# Patient Record
Sex: Female | Born: 1955
Health system: Southern US, Community
[De-identification: ages and names within clinical notes are randomized; demographics above are authoritative.]

## PROBLEM LIST (undated history)

## (undated) DIAGNOSIS — M8580 Other specified disorders of bone density and structure, unspecified site: Secondary | ICD-10-CM

## (undated) DIAGNOSIS — K219 Gastro-esophageal reflux disease without esophagitis: Secondary | ICD-10-CM

## (undated) DIAGNOSIS — Q969 Turner's syndrome, unspecified: Secondary | ICD-10-CM

## (undated) DIAGNOSIS — E785 Hyperlipidemia, unspecified: Secondary | ICD-10-CM

## (undated) DIAGNOSIS — K649 Unspecified hemorrhoids: Secondary | ICD-10-CM

## (undated) DIAGNOSIS — Z86010 Personal history of colonic polyps: Secondary | ICD-10-CM

## (undated) DIAGNOSIS — H9190 Unspecified hearing loss, unspecified ear: Secondary | ICD-10-CM

## (undated) DIAGNOSIS — I10 Essential (primary) hypertension: Secondary | ICD-10-CM

## (undated) DIAGNOSIS — E538 Deficiency of other specified B group vitamins: Secondary | ICD-10-CM

## (undated) DIAGNOSIS — K921 Melena: Secondary | ICD-10-CM

## (undated) DIAGNOSIS — S72142A Displaced intertrochanteric fracture of left femur, initial encounter for closed fracture: Secondary | ICD-10-CM

## (undated) DIAGNOSIS — K5289 Other specified noninfective gastroenteritis and colitis: Secondary | ICD-10-CM

## (undated) DIAGNOSIS — K5730 Diverticulosis of large intestine without perforation or abscess without bleeding: Secondary | ICD-10-CM

## (undated) HISTORY — PX: COLONOSCOPY: SHX174

## (undated) HISTORY — DX: Unspecified hemorrhoids: K64.9

## (undated) HISTORY — DX: Personal history of colonic polyps: Z86.010

## (undated) HISTORY — PX: TONSILLECTOMY: SUR1361

## (undated) HISTORY — DX: Melena: K92.1

## (undated) HISTORY — DX: Hyperlipidemia, unspecified: E78.5

## (undated) HISTORY — DX: Deficiency of other specified B group vitamins: E53.8

## (undated) HISTORY — DX: Unspecified hearing loss, unspecified ear: H91.90

## (undated) HISTORY — PX: UPPER GASTROINTESTINAL ENDOSCOPY: SHX188

## (undated) HISTORY — DX: Diverticulosis of large intestine without perforation or abscess without bleeding: K57.30

## (undated) HISTORY — DX: Other specified noninfective gastroenteritis and colitis: K52.89

## (undated) HISTORY — PX: POLYPECTOMY: SHX149

---

## 2001-12-23 ENCOUNTER — Encounter: Payer: Self-pay | Admitting: Family Medicine

## 2001-12-23 ENCOUNTER — Encounter: Admission: RE | Admit: 2001-12-23 | Discharge: 2001-12-23 | Payer: Self-pay | Admitting: Family Medicine

## 2002-02-11 ENCOUNTER — Encounter: Payer: Self-pay | Admitting: Gastroenterology

## 2002-02-27 ENCOUNTER — Encounter (INDEPENDENT_AMBULATORY_CARE_PROVIDER_SITE_OTHER): Payer: Self-pay | Admitting: *Deleted

## 2002-02-27 ENCOUNTER — Encounter (INDEPENDENT_AMBULATORY_CARE_PROVIDER_SITE_OTHER): Payer: Self-pay | Admitting: Specialist

## 2002-02-27 ENCOUNTER — Encounter: Payer: Self-pay | Admitting: Gastroenterology

## 2002-02-27 ENCOUNTER — Ambulatory Visit (HOSPITAL_COMMUNITY): Admission: RE | Admit: 2002-02-27 | Discharge: 2002-02-27 | Payer: Self-pay | Admitting: Gastroenterology

## 2002-05-21 HISTORY — PX: INCISION AND DRAINAGE: SHX5863

## 2002-05-21 HISTORY — PX: FRACTURE SURGERY: SHX138

## 2003-01-06 ENCOUNTER — Ambulatory Visit (HOSPITAL_BASED_OUTPATIENT_CLINIC_OR_DEPARTMENT_OTHER): Admission: RE | Admit: 2003-01-06 | Discharge: 2003-01-06 | Payer: Self-pay | Admitting: *Deleted

## 2003-06-09 ENCOUNTER — Other Ambulatory Visit: Admission: RE | Admit: 2003-06-09 | Discharge: 2003-06-09 | Payer: Self-pay | Admitting: *Deleted

## 2003-07-20 ENCOUNTER — Encounter: Admission: RE | Admit: 2003-07-20 | Discharge: 2003-07-20 | Payer: Self-pay

## 2005-07-30 ENCOUNTER — Encounter: Admission: RE | Admit: 2005-07-30 | Discharge: 2005-07-30 | Payer: Self-pay | Admitting: *Deleted

## 2006-01-14 ENCOUNTER — Ambulatory Visit: Payer: Self-pay

## 2006-09-25 ENCOUNTER — Encounter: Admission: RE | Admit: 2006-09-25 | Discharge: 2006-09-25 | Payer: Self-pay | Admitting: Occupational Medicine

## 2006-11-27 ENCOUNTER — Encounter: Admission: RE | Admit: 2006-11-27 | Discharge: 2006-11-27 | Payer: Self-pay | Admitting: *Deleted

## 2007-05-22 HISTORY — PX: HERNIA REPAIR: SHX51

## 2008-03-01 ENCOUNTER — Encounter: Admission: RE | Admit: 2008-03-01 | Discharge: 2008-03-01 | Payer: Self-pay | Admitting: Obstetrics

## 2008-04-07 ENCOUNTER — Ambulatory Visit (HOSPITAL_BASED_OUTPATIENT_CLINIC_OR_DEPARTMENT_OTHER): Admission: RE | Admit: 2008-04-07 | Discharge: 2008-04-07 | Payer: Self-pay | Admitting: General Surgery

## 2008-12-29 ENCOUNTER — Ambulatory Visit: Payer: Self-pay | Admitting: Gastroenterology

## 2009-01-10 ENCOUNTER — Encounter: Payer: Self-pay | Admitting: Gastroenterology

## 2009-01-10 ENCOUNTER — Ambulatory Visit: Payer: Self-pay | Admitting: Gastroenterology

## 2009-01-11 ENCOUNTER — Encounter: Payer: Self-pay | Admitting: Gastroenterology

## 2009-01-12 ENCOUNTER — Telehealth: Payer: Self-pay | Admitting: Gastroenterology

## 2009-02-03 DIAGNOSIS — K5730 Diverticulosis of large intestine without perforation or abscess without bleeding: Secondary | ICD-10-CM

## 2009-02-03 DIAGNOSIS — I10 Essential (primary) hypertension: Secondary | ICD-10-CM | POA: Insufficient documentation

## 2009-02-03 DIAGNOSIS — Z860100 Personal history of colon polyps, unspecified: Secondary | ICD-10-CM

## 2009-02-03 DIAGNOSIS — K219 Gastro-esophageal reflux disease without esophagitis: Secondary | ICD-10-CM | POA: Insufficient documentation

## 2009-02-03 DIAGNOSIS — K649 Unspecified hemorrhoids: Secondary | ICD-10-CM

## 2009-02-03 DIAGNOSIS — K5289 Other specified noninfective gastroenteritis and colitis: Secondary | ICD-10-CM | POA: Insufficient documentation

## 2009-02-03 HISTORY — DX: Unspecified hemorrhoids: K64.9

## 2009-02-03 HISTORY — DX: Diverticulosis of large intestine without perforation or abscess without bleeding: K57.30

## 2009-02-03 HISTORY — DX: Other specified noninfective gastroenteritis and colitis: K52.89

## 2009-02-03 HISTORY — DX: Personal history of colon polyps, unspecified: Z86.0100

## 2009-02-03 HISTORY — DX: Personal history of colonic polyps: Z86.010

## 2009-02-08 ENCOUNTER — Ambulatory Visit: Payer: Self-pay | Admitting: Gastroenterology

## 2009-02-09 DIAGNOSIS — E538 Deficiency of other specified B group vitamins: Secondary | ICD-10-CM

## 2009-02-09 HISTORY — DX: Deficiency of other specified B group vitamins: E53.8

## 2009-02-09 LAB — CONVERTED CEMR LAB
Ferritin: 42.9 ng/mL (ref 10.0–291.0)
Folate: 13.6 ng/mL
Iron: 80 ug/dL (ref 42–145)

## 2009-02-14 ENCOUNTER — Ambulatory Visit: Payer: Self-pay | Admitting: Gastroenterology

## 2009-02-21 ENCOUNTER — Ambulatory Visit: Payer: Self-pay | Admitting: Gastroenterology

## 2009-02-28 ENCOUNTER — Ambulatory Visit: Payer: Self-pay | Admitting: Gastroenterology

## 2009-03-01 ENCOUNTER — Telehealth: Payer: Self-pay | Admitting: Gastroenterology

## 2009-03-09 ENCOUNTER — Encounter: Admission: RE | Admit: 2009-03-09 | Discharge: 2009-03-09 | Payer: Self-pay | Admitting: Obstetrics

## 2009-04-04 ENCOUNTER — Ambulatory Visit: Payer: Self-pay | Admitting: Gastroenterology

## 2009-04-12 ENCOUNTER — Ambulatory Visit: Payer: Self-pay | Admitting: Gastroenterology

## 2009-05-04 ENCOUNTER — Ambulatory Visit: Payer: Self-pay | Admitting: Gastroenterology

## 2009-06-08 ENCOUNTER — Ambulatory Visit: Payer: Self-pay | Admitting: Gastroenterology

## 2009-07-13 ENCOUNTER — Ambulatory Visit: Payer: Self-pay | Admitting: Gastroenterology

## 2009-08-10 ENCOUNTER — Ambulatory Visit: Payer: Self-pay | Admitting: Gastroenterology

## 2009-08-17 ENCOUNTER — Encounter (INDEPENDENT_AMBULATORY_CARE_PROVIDER_SITE_OTHER): Payer: Self-pay | Admitting: *Deleted

## 2009-09-07 ENCOUNTER — Ambulatory Visit: Payer: Self-pay | Admitting: Gastroenterology

## 2010-04-10 ENCOUNTER — Encounter: Admission: RE | Admit: 2010-04-10 | Discharge: 2010-04-10 | Payer: Self-pay | Admitting: Obstetrics

## 2010-06-20 NOTE — Assessment & Plan Note (Signed)
Summary: MONTHLY B 12 SHOT...LSW.  Nurse Visit   Allergies: No Known Drug Allergies  Medication Administration  Injection # 1:    Medication: Vit B12 1000 mcg    Diagnosis: B12 DEFICIENCY (ICD-266.2)    Route: IM    Site: R deltoid    Exp Date: 10/12    Lot #: [id number]    Mfr: American Regent    Patient tolerated injection without complications    Given by: Hortense Ramal CMA Duncan Dull) (June 08, 2009 3:23 PM)  Orders Added: 1)  Vit B12 1000 mcg [J3420]

## 2010-06-20 NOTE — Letter (Signed)
Summary: Office Visit Letter  [facility]  [address] [place], [place] [address]   Phone: [phone]  Fax: [phone]      August 17, 2009 MRN: [id number]   [name] [address] [place], [place]  [address]   Dear Ms. Roseman,   According to our records, it is time for you to schedule a follow-up office visit with [place].   At your convenience, please call [phone] (option #2)to schedule an office visit. If you have any questions, concerns, or feel that this letter is in error, we would appreciate your call.   Sincerely,  Vania Rea. Jarold Motto, M.D.  [facility] [phone]

## 2010-06-20 NOTE — Assessment & Plan Note (Signed)
Summary: B12 SHOT..AM.  Nurse Visit   Allergies: No Known Drug Allergies  Medication Administration  Injection # 1:    Medication: Vit B12 1000 mcg    Diagnosis: B12 DEFICIENCY (ICD-266.2)    Route: IM    Site: R deltoid    Exp Date: 03/22/2011    Lot #: [id number]    Mfr: American Regent    Patient tolerated injection without complications    Given by: Christie Nottingham CMA Duncan Dull) (July 13, 2009 3:21 PM)

## 2010-06-20 NOTE — Assessment & Plan Note (Signed)
Summary: 266.2/monthly b-12 inj/all  Nurse Visit   Allergies: No Known Drug Allergies  Medication Administration  Injection # 1:    Medication: Vit B12 1000 mcg    Diagnosis: B12 DEFICIENCY (ICD-266.2)    Route: IM    Site: R deltoid    Exp Date: 04/30/2011    Lot #: [id number]    Mfr: American Regent    Patient tolerated injection without complications    Given by: Christie Nottingham CMA Duncan Dull) (August 10, 2009 3:27 PM)  Orders Added: 1)  Vit B12 1000 mcg [J3420]

## 2010-06-20 NOTE — Assessment & Plan Note (Signed)
Summary: 266.2/monthly b-12 inj  Nurse Visit   Allergies: No Known Drug Allergies  Medication Administration  Injection # 1:    Medication: Vit B12 1000 mcg    Diagnosis: B12 DEFICIENCY (ICD-266.2)    Route: IM    Site: R deltoid    Exp Date: 06/22/2011    Lot #: [id number]    Mfr: American Regent    Patient tolerated injection without complications    Given by: Christie Nottingham CMA Duncan Dull) (September 07, 2009 3:41 PM)  Orders Added: 1)  Vit B12 1000 mcg [J3420]

## 2010-10-03 NOTE — Op Note (Signed)
[name], [name]               ACCOUNT NO.:  [id number]   MEDICAL RECORD NO.:  [id number]          PATIENT TYPE:  AMB   LOCATION:  DSC                          FACILITY:  [facility]   PHYSICIAN:  Almond Lint, MD       DATE OF BIRTH:  Aug 09, 1955   DATE OF PROCEDURE:  04/07/2008  DATE OF DISCHARGE:                               OPERATIVE REPORT   PREOPERATIVE DIAGNOSIS:  Left inguinal hernia.   POSTOPERATIVE DIAGNOSIS:  Left inguinal hernia.   PROCEDURE PERFORMED:  Left inguinal herniorrhaphy with mesh.   SURGEON:  Almond Lint, MD   ANESTHESIA:  General and local.   FINDINGS:  Moderate-sized indirect left inguinal hernia with omentum and  sac.   SPECIMEN:  None.   ESTIMATED BLOOD LOSS:  Minimal.   COMPLICATIONS:  None known.   PROCEDURE:  Ms. Lineberry was identified in the holding area and taken to  the operating room where she was placed supine on the operating room  table.  General anesthesia was induced.  Her left groin was clipped,  prepped, and draped in sterile fashion.  A time-out was performed  according to the surgical safety checklist.  When all was correct we  continued.  Her pubis was identified as well as her anterosuperior iliac  spine.  Her incision was marked approximately at the middle third of the  that line.  This was placed on skin crease.  The skin was anesthetized  with a mixture of 1% lidocaine plain with 0.25% Marcaine with  epinephrine.  The subcutaneous tissues were divided with the Bovie  electrocautery.  A Weitlaner retractor was placed into the subcutaneous  fat, and the Scarpa fascia was divided with the Bovie.  A large bridging  vein was clamped and divided and tied with 3-0 Vicryl ties.  The  external oblique was identified and was cleaned off with Kitner.  A 15-  blade was used to incise the external oblique fascia in the direction of  the fibers.  The Metzenbaum scissors were used to elevate the external  oblique fascia and extend the  incision through the internal ring  inferomedially and superolaterally.  The hernia sac was identified and  was dissected free from the pubis with blunt dissection.  There was no  spermatic cord.  The muscular fibers were cleaned off of the sac.  The  sac was isolated and clamped in 2 places.  This was opened sharply with  Metzenbaum scissors.  There was omentum in the sac and the round  ligament was seen.  However, there was no sliding component or any bowel  identified in the hernia sac.  This was ligated high and transected.  A  2-0 silk suture ligature was used to close the sac and dunk it back into  the abdomen.  The 3 x 6 polypropylene mesh was cut to the appropriate  shape.  This was secured to the pubic tubercle right along the inferior  shelving edge with 2-0 Prolene.  Superior shelving edge was also run  underneath the external oblique fascia.  The mesh laid down flat and  there was no spermatic cord.  There were no tails created.  The lateral  portion of the mesh was tucked up underneath the external oblique  fascia.  The ilioinguinal nerve was traversing exactly where the sutures  would need to be placed and to avoid nerve entrapment the ilioinguinal  nerve was transected.  The external oblique was closed over the top of  the mesh with a  running 2-0 Vicryl.  The Scarpa fascia was run with a 3-0 Vicryl and an  interrupted were placed in a deep dermal fashion with 3-0 Vicryl.  The  skin was run using 4-0 Monocryl and then the skin cleaned, dried, and  dressed with Dermabond.  The patient was awakened from anesthesia and  taken to the PACU in stable condition.      Almond Lint, MD  Electronically Signed     FB/MEDQ  D:  04/07/2008  T:  04/07/2008  Job:  [id number]

## 2010-10-06 NOTE — Op Note (Signed)
[name], [name] NO.:  [id number]   MEDICAL RECORD NO.:  [id number]                   PATIENT TYPE:  AMB   LOCATION:  DSC                                  FACILITY:  [facility]   PHYSICIAN:  Lowell Bouton, M.D.      DATE OF BIRTH:  1955-08-03   DATE OF PROCEDURE:  01/06/2003  DATE OF DISCHARGE:                                 OPERATIVE REPORT   PREOPERATIVE DIAGNOSIS:  Open fracture proximal phalanx left thumb with  acute osteomyelitis.   POSTOPERATIVE DIAGNOSIS:  Open fracture proximal phalanx left thumb with  acute osteomyelitis.   PROCEDURE:  Irrigation and debridement of left thumb with open pinning of  proximal phalanx fracture.   SURGEON:  Lowell Bouton, M.D.   ANESTHESIA:  General.   FINDINGS:  The patient had a dog bite with puncture wounds both radially and  ulnarly that was six days old.  The proximal phalanx was fractured through  the proximal end with a longitudinal split distally.  The fracture was  displaced and there was an area of erosion in the metaphysis.  This appeared  to be consistent with osteomyelitis. There was no gross purulent material at  the time of incision and drainage.   DESCRIPTION OF PROCEDURE:  Under general anesthesia with a tourniquet on the  left arm, the left hand was prepped and draped in the usual sterile fashion.  After elevating the limb, the tourniquet was inflated to 275 mmHg. A  longitudinal incision was made over the proximal phalanx of the left thumb.  Sharp dissection was carried down through the subcutaneous tissues and  bleeding points were coagulated. Blunt dissection was carried down to the  EPL which was retracted ulnarly.  The extensor mechanism was incised  longitudinally just radial to the EPL tendon.  Sharp dissection was carried  down through the periosteum to the fracture site.  Cultures were obtained  both aerobic and anaerobic.  The fracture site was irrigated  copiously with  pulsatile lavage irrigation and a curet was used to debride the metaphyseal  area of the bone. Again, the pulsatile lavage was used to irrigate out the  fracture site.  A 4.5 K-wire was then placed retrograde across the  metaphysis of the proximal phalanx and out through the skin. A second K-wire  was placed percutaneously through the proximal fragment and then the  fracture was reduced.  Crossed 4.5 K-wires were used for fixation and there  was good alignment of the fracture on the x-ray.  The K-wires were bent over  and left protruding from the skin. The wound was again irrigated with  pulsatile lavage. Iodoform packing was inserted at the fracture site and the  wound edges were closed with 4-0 nylon suture leaving the central portion  open. Sterile dressings were applied followed by a thumb spica splint.  0.5%  Marcaine digital block was inserted for pain control.  The patient was  placed in a thumb spica splint and went to the recovery room awake, stable,  and in good condition.                                               Lowell Bouton, M.D.    EMM/MEDQ  D:  01/06/2003  T:  01/06/2003  Job:  [id number]

## 2010-10-06 NOTE — Op Note (Signed)
[name], [name]                           ACCOUNT NO.:  [id number]   MEDICAL RECORD NO.:  [id number]                   PATIENT TYPE:  AMB   LOCATION:  ENDO                                 FACILITY:  [facility]   PHYSICIAN:  Petra Kuba, M.D.                 DATE OF BIRTH:  27-Mar-1956   DATE OF PROCEDURE:  DATE OF DISCHARGE:                                 OPERATIVE REPORT   PROCEDURE:  Colonoscopy with polypectomy.   INDICATIONS:  Patient with one-time GI bleed, currently asymptomatic.  No  previous colonic tests.  Consent was signed after risks, benefits, methods,  and options thoroughly discussed in the office.   MEDICATIONS:  Demerol 80 mg, Versed 8 mg.   DESCRIPTION OF PROCEDURE:  Rectal inspection was pertinent for external  hemorrhoids, small.  Digital exam was negative.  The pediatric video  adjustable colonoscope was inserted, easily advanced to the level of the  ileocecal valve.  To advance to the cecal pole required some abdominal  pressure.  On insertion no abnormalities were seen.  The cecum was pertinent  for one small polyp, which was hot biopsied.  Along the ileocecal valve two  small polyps were seen.  Both were snared.  One we cut through,  unfortunately, prior to electrocautery being applied, and the base was hot  biopsied.  Both polyps were suctioned through the scope and collected in the  trap.  Initially we had trouble following one of the polyps, so we went  ahead and hot biopsied that base as well.  One other tiny proximal ascending  polyp was seen and was hot biopsied as well, and they were all put in the  same container.  The scope was slowly withdrawn.  Prep was adequate.  There  was some liquid stool that required washing and suctioning.  Other than a  tiny midsigmoid polyp which was hot biopsied and put in a separate  container, no additional findings were seen as we slowly withdrew back to  the rectum.  Once back in the rectum the scope was then  retroflexed,  pertinent for some internal hemorrhoids.  The scope was straightened and  readvanced a short way up the left side of the colon, air was suctioned, and  the scope removed.  The patient tolerated the procedure well.  There was no  obvious immediate complication.   ENDOSCOPIC DIAGNOSES:  1. Internal-external small hemorrhoids.  2. One tiny  midsigmoid polyp, hot biopsied.  3. Two small ileocecal valve polyps, both snared, and both bases hot     biopsied.  4. Two tiny cecal and ascending polyps, hot biopsied.  5. Otherwise within normal limits to the cecum.    PLAN:  Await pathology to determine future colonic screening.  Happy to see  back p.r.n., otherwise return care to Dr. Manus Gunning for the customary health  care maintenance to include yearly rectals, guaiacs.  Will probably ask Dr.  Manus Gunning to see her back one more time in a month or two to recheck symptoms,  CBCs, guaiacs, and make sure no further workup plans are needed.                                                 Petra Kuba, M.D.    MEM/MEDQ  D:  02/27/2002  T:  02/27/2002  Job:  [id number]   cc:   Bryan Lemma. Manus Gunning, M.D.  [address] [place]  [place] [address]  Fax: [phone]

## 2010-12-25 ENCOUNTER — Telehealth: Payer: Self-pay | Admitting: *Deleted

## 2010-12-25 NOTE — Telephone Encounter (Signed)
Patient is due for a follow up office visit. I have left a message to have her call me back.

## 2011-01-03 NOTE — Telephone Encounter (Signed)
Left a message for pt to call back to schedule an office visit, We will mail pt a reminder letter.

## 2011-02-20 LAB — BASIC METABOLIC PANEL
CO2: 28
Calcium: 9.4
GFR calc Af Amer: >60
GFR calc non Af Amer: >60
Glucose, Bld: 80

## 2011-05-11 ENCOUNTER — Other Ambulatory Visit: Payer: Self-pay | Admitting: Obstetrics

## 2011-05-11 DIAGNOSIS — Z1231 Encounter for screening mammogram for malignant neoplasm of breast: Secondary | ICD-10-CM

## 2011-06-07 ENCOUNTER — Emergency Department (INDEPENDENT_AMBULATORY_CARE_PROVIDER_SITE_OTHER): Payer: 59

## 2011-06-07 ENCOUNTER — Encounter (HOSPITAL_COMMUNITY): Payer: Self-pay | Admitting: Emergency Medicine

## 2011-06-07 ENCOUNTER — Emergency Department (INDEPENDENT_AMBULATORY_CARE_PROVIDER_SITE_OTHER)
Admission: EM | Admit: 2011-06-07 | Discharge: 2011-06-07 | Disposition: A | Discharge status: Home / Self Care | Payer: 59 | Source: Home / Self Care | Attending: Emergency Medicine | Admitting: Emergency Medicine

## 2011-06-07 DIAGNOSIS — S62309A Unspecified fracture of unspecified metacarpal bone, initial encounter for closed fracture: Secondary | ICD-10-CM

## 2011-06-07 MED ORDER — HYDROCODONE-ACETAMINOPHEN 5-500 MG PO TABS: 1.0000 | ORAL_TABLET | Freq: Four times a day (QID) | ORAL | Status: DC | PRN

## 2011-06-07 NOTE — ED Provider Notes (Signed)
History     CSN: [id number]  Arrival date & time 06/07/11  1452   First MD Initiated Contact with Patient 06/07/11 1453      Chief Complaint  Patient presents with  . Hand Pain    (Consider location/radiation/quality/duration/timing/severity/associated sxs/prior treatment) HPI Comments: Fell yesterday. and my R hand specially my fifth finger its sore and hurting, woke up this am and my R hand became swollen"  Patient is a 56 y.o. female presenting with hand pain. The history is provided by the patient.  Hand Pain This is a new problem. The current episode started yesterday. The problem has not changed since onset.She has tried nothing for the symptoms.    History reviewed. No pertinent past medical history.  History reviewed. No pertinent past surgical history.  No family history on file.  History  Substance Use Topics  . Smoking status: Not on file  . Smokeless tobacco: Not on file  . Alcohol Use: Not on file    OB History    Grav Para Term Preterm Abortions TAB SAB Ect Mult Living                  Review of Systems  Constitutional: Negative for fever and fatigue.  Skin: Positive for color change. Negative for rash and wound.  Neurological: Negative for weakness and numbness.    Allergies  Review of patient's allergies indicates no known allergies.  Home Medications   Current Outpatient Rx  Name Route Sig Dispense Refill  . LISINOPRIL 10 MG PO TABS Oral Take 10 mg by mouth daily.    Marland Kitchen SIMVASTATIN 10 MG PO TABS Oral Take 10 mg by mouth at bedtime.      BP 151/100  Pulse 72  Temp(Src) 98.6 F (37 C) (Oral)  Resp 20  SpO2 100%  Physical Exam  Nursing note and vitals reviewed. Constitutional: She appears well-developed and well-nourished.  Musculoskeletal: She exhibits tenderness.       Right wrist: She exhibits tenderness and swelling. She exhibits normal range of motion, no effusion and no crepitus.       Arms: Skin: No rash noted. No erythema.     ED Course  Procedures (including critical care time)  Labs Reviewed - No data to display No results found.   No diagnosis found.    MDM          Jimmie Molly, MD 06/07/11 1622

## 2011-06-07 NOTE — ED Notes (Signed)
Pt fell on hand Tuesday. At first just one finger was swollen, now her whole hand is red and swollen.

## 2011-06-11 ENCOUNTER — Ambulatory Visit: Payer: Self-pay

## 2011-06-12 ENCOUNTER — Other Ambulatory Visit: Payer: Self-pay | Admitting: Orthopedic Surgery

## 2011-06-12 ENCOUNTER — Encounter (HOSPITAL_BASED_OUTPATIENT_CLINIC_OR_DEPARTMENT_OTHER): Payer: Self-pay | Admitting: *Deleted

## 2011-06-12 NOTE — Progress Notes (Signed)
No meds now-lost 80 lb-no labs needed

## 2011-06-14 ENCOUNTER — Encounter (HOSPITAL_BASED_OUTPATIENT_CLINIC_OR_DEPARTMENT_OTHER): Payer: Self-pay | Admitting: Anesthesiology

## 2011-06-14 ENCOUNTER — Ambulatory Visit (HOSPITAL_BASED_OUTPATIENT_CLINIC_OR_DEPARTMENT_OTHER): Payer: 59 | Admitting: Anesthesiology

## 2011-06-14 ENCOUNTER — Encounter (HOSPITAL_BASED_OUTPATIENT_CLINIC_OR_DEPARTMENT_OTHER): Payer: Self-pay | Admitting: *Deleted

## 2011-06-14 ENCOUNTER — Ambulatory Visit (HOSPITAL_BASED_OUTPATIENT_CLINIC_OR_DEPARTMENT_OTHER)
Admission: RE | Admit: 2011-06-14 | Discharge: 2011-06-14 | Disposition: A | Discharge status: Home Health | Payer: 59 | Source: Ambulatory Visit | Attending: Orthopedic Surgery | Admitting: Orthopedic Surgery

## 2011-06-14 ENCOUNTER — Encounter (HOSPITAL_BASED_OUTPATIENT_CLINIC_OR_DEPARTMENT_OTHER): Payer: Self-pay | Admitting: Orthopedic Surgery

## 2011-06-14 ENCOUNTER — Encounter (HOSPITAL_BASED_OUTPATIENT_CLINIC_OR_DEPARTMENT_OTHER)
Admission: RE | Disposition: A | Discharge status: Home Health | Payer: Self-pay | Source: Ambulatory Visit | Attending: Orthopedic Surgery

## 2011-06-14 DIAGNOSIS — I10 Essential (primary) hypertension: Secondary | ICD-10-CM | POA: Insufficient documentation

## 2011-06-14 DIAGNOSIS — Y998 Other external cause status: Secondary | ICD-10-CM | POA: Insufficient documentation

## 2011-06-14 DIAGNOSIS — S62339A Displaced fracture of neck of unspecified metacarpal bone, initial encounter for closed fracture: Secondary | ICD-10-CM | POA: Insufficient documentation

## 2011-06-14 DIAGNOSIS — W108XXA Fall (on) (from) other stairs and steps, initial encounter: Secondary | ICD-10-CM | POA: Insufficient documentation

## 2011-06-14 DIAGNOSIS — F430 Acute stress reaction: Secondary | ICD-10-CM | POA: Insufficient documentation

## 2011-06-14 HISTORY — DX: Essential (primary) hypertension: I10

## 2011-06-14 HISTORY — PX: ORIF FINGER FRACTURE: SHX2122

## 2011-06-14 HISTORY — DX: Gastro-esophageal reflux disease without esophagitis: K21.9

## 2011-06-14 HISTORY — DX: Other specified disorders of bone density and structure, unspecified site: M85.80

## 2011-06-14 LAB — POCT HEMOGLOBIN-HEMACUE: Hemoglobin: 15.4 g/dL — ABNORMAL HIGH (ref 12.0–15.0)

## 2011-06-14 SURGERY — OPEN REDUCTION INTERNAL FIXATION (ORIF) METACARPAL (FINGER) FRACTURE
Anesthesia: General | Site: Hand | Laterality: Right | Wound class: Clean

## 2011-06-14 MED ORDER — CHLORHEXIDINE GLUCONATE 4 % EX LIQD: 60.0000 mL | Freq: Once | CUTANEOUS | Status: DC

## 2011-06-14 MED ORDER — LIDOCAINE HCL (CARDIAC) 20 MG/ML IV SOLN
INTRAVENOUS | Status: DC | PRN
  Administered 2011-06-14: 50 mg via INTRAVENOUS

## 2011-06-14 MED ORDER — PROPOFOL 10 MG/ML IV EMUL
INTRAVENOUS | Status: DC | PRN
  Administered 2011-06-14: 150 mg via INTRAVENOUS

## 2011-06-14 MED ORDER — OXYCODONE-ACETAMINOPHEN 5-325 MG PO TABS: ORAL_TABLET | ORAL | Status: AC

## 2011-06-14 MED ORDER — HYDROMORPHONE HCL PF 1 MG/ML IJ SOLN
0.2500 mg | INTRAMUSCULAR | Status: DC | PRN
  Administered 2011-06-14 (×2): 0.5 mg via INTRAVENOUS

## 2011-06-14 MED ORDER — LACTATED RINGERS IV SOLN
INTRAVENOUS | Status: DC
  Administered 2011-06-14 (×2): via INTRAVENOUS

## 2011-06-14 MED ORDER — FENTANYL CITRATE 0.05 MG/ML IJ SOLN
INTRAMUSCULAR | Status: DC | PRN
  Administered 2011-06-14 (×2): 50 ug via INTRAVENOUS

## 2011-06-14 MED ORDER — CEFAZOLIN SODIUM 1-5 GM-% IV SOLN
1.0000 g | INTRAVENOUS | Status: AC
  Administered 2011-06-14: 1 g via INTRAVENOUS

## 2011-06-14 MED ORDER — ONDANSETRON HCL 4 MG/2ML IJ SOLN
INTRAMUSCULAR | Status: DC | PRN
  Administered 2011-06-14: 4 mg via INTRAVENOUS

## 2011-06-14 MED ORDER — KETOROLAC TROMETHAMINE 30 MG/ML IJ SOLN
INTRAMUSCULAR | Status: DC | PRN
  Administered 2011-06-14: 30 mg via INTRAVENOUS

## 2011-06-14 MED ORDER — PROMETHAZINE HCL 25 MG/ML IJ SOLN: 6.2500 mg | INTRAMUSCULAR | Status: DC | PRN

## 2011-06-14 MED ORDER — MIDAZOLAM HCL 5 MG/5ML IJ SOLN
INTRAMUSCULAR | Status: DC | PRN
  Administered 2011-06-14: 2 mg via INTRAVENOUS

## 2011-06-14 MED ORDER — MEPERIDINE HCL 25 MG/ML IJ SOLN: 6.2500 mg | INTRAMUSCULAR | Status: DC | PRN

## 2011-06-14 MED ORDER — DEXAMETHASONE SODIUM PHOSPHATE 10 MG/ML IJ SOLN
INTRAMUSCULAR | Status: DC | PRN
  Administered 2011-06-14: 10 mg via INTRAVENOUS

## 2011-06-14 SURGICAL SUPPLY — 74 items
.35MM K-WIRE ×1 IMPLANT
1.0MM DRILL BIT ×1 IMPLANT
1.1MM DRILL BIT ×1 IMPLANT
BANDAGE ACE 4 STERILE (GAUZE/BANDAGES/DRESSINGS) ×1 IMPLANT
BANDAGE CONFORM 2X5YD N/S (GAUZE/BANDAGES/DRESSINGS) ×1 IMPLANT
BANDAGE ELASTIC 3 VELCRO ST LF (GAUZE/BANDAGES/DRESSINGS) ×1 IMPLANT
BANDAGE GAUZE ELAST BULKY 4 IN (GAUZE/BANDAGES/DRESSINGS) ×2 IMPLANT
BLADE MINI RND TIP GREEN BEAV (BLADE) IMPLANT
BLADE SURG 15 STRL LF DISP TIS (BLADE) ×2 IMPLANT
BLADE SURG 15 STRL SS (BLADE) ×4
BNDG CMPR 9X4 STRL LF SNTH (GAUZE/BANDAGES/DRESSINGS) ×1
BNDG CMPR MD 5X2 ELC HKLP STRL (GAUZE/BANDAGES/DRESSINGS)
BNDG ELASTIC 2 VLCR STRL LF (GAUZE/BANDAGES/DRESSINGS) IMPLANT
BNDG ESMARK 4X9 LF (GAUZE/BANDAGES/DRESSINGS) ×1 IMPLANT
CHLORAPREP W/TINT 26ML (MISCELLANEOUS) ×2 IMPLANT
CLOTH BEACON ORANGE TIMEOUT ST (SAFETY) ×2 IMPLANT
CORDS BIPOLAR (ELECTRODE) ×2 IMPLANT
COVER MAYO STAND STRL (DRAPES) ×2 IMPLANT
COVER TABLE BACK 60X90 (DRAPES) ×2 IMPLANT
CUFF TOURNIQUET SINGLE 18IN (TOURNIQUET CUFF) ×2 IMPLANT
DRAPE EXTREMITY T 121X128X90 (DRAPE) ×2 IMPLANT
DRAPE OEC MINIVIEW 54X84 (DRAPES) ×1 IMPLANT
DRAPE SURG 17X23 STRL (DRAPES) ×2 IMPLANT
DRSG XEROFORM 1X8 (GAUZE/BANDAGES/DRESSINGS) ×1 IMPLANT
GAUZE XEROFORM 1X8 LF (GAUZE/BANDAGES/DRESSINGS) ×2 IMPLANT
GLOVE BIO SURGEON STRL SZ 6.5 (GLOVE) ×1 IMPLANT
GLOVE BIO SURGEON STRL SZ7 (GLOVE) ×1 IMPLANT
GLOVE BIO SURGEON STRL SZ7.5 (GLOVE) ×2 IMPLANT
GLOVE BIOGEL PI IND STRL 6.5 (GLOVE) IMPLANT
GLOVE BIOGEL PI IND STRL 8 (GLOVE) ×1 IMPLANT
GLOVE BIOGEL PI IND STRL 8.5 (GLOVE) IMPLANT
GLOVE BIOGEL PI INDICATOR 6.5 (GLOVE) ×1
GLOVE BIOGEL PI INDICATOR 8 (GLOVE) ×1
GLOVE BIOGEL PI INDICATOR 8.5 (GLOVE)
GLOVE INDICATOR 7.0 STRL GRN (GLOVE) ×1 IMPLANT
GLOVE SURG ORTHO 8.0 STRL STRW (GLOVE) IMPLANT
GOWN PREVENTION PLUS XLARGE (GOWN DISPOSABLE) ×3 IMPLANT
GOWN PREVENTION PLUS XXLARGE (GOWN DISPOSABLE) ×2 IMPLANT
GOWN STRL REIN XL XLG (GOWN DISPOSABLE) ×2 IMPLANT
NDL HYPO 25X1 1.5 SAFETY (NEEDLE) IMPLANT
NEEDLE HYPO 22GX1.5 SAFETY (NEEDLE) IMPLANT
NEEDLE HYPO 25X1 1.5 SAFETY (NEEDLE) ×2 IMPLANT
NS IRRIG 1000ML POUR BTL (IV SOLUTION) ×2 IMPLANT
PACK BASIN DAY SURGERY FS (CUSTOM PROCEDURE TRAY) ×2 IMPLANT
PAD CAST 3X4 CTTN HI CHSV (CAST SUPPLIES) IMPLANT
PAD CAST 4YDX4 CTTN HI CHSV (CAST SUPPLIES) IMPLANT
PADDING CAST ABS 4INX4YD NS (CAST SUPPLIES)
PADDING CAST ABS COTTON 4X4 ST (CAST SUPPLIES) ×1 IMPLANT
PADDING CAST COTTON 3X4 STRL (CAST SUPPLIES) ×2
PADDING CAST COTTON 4X4 STRL (CAST SUPPLIES) ×2
PADDING WEBRIL 3 STERILE (GAUZE/BANDAGES/DRESSINGS) ×1 IMPLANT
PADDING WEBRIL 4 STERILE (GAUZE/BANDAGES/DRESSINGS) ×1 IMPLANT
SCREW 1.3X11MM (Screw) ×2 IMPLANT
SCREW BN 11X1.3XNONLOCK HND (Screw) IMPLANT
SCREW NL 1.5X12 (Screw) ×1 IMPLANT
SCREW NL 1.5X13 (Screw) ×1 IMPLANT
SCREW NON-LOCK 1.3X12 (Screw) ×1 IMPLANT
SCREW NONIOC 1.5 14M (Screw) ×1 IMPLANT
SLEEVE SCD COMPRESS KNEE MED (MISCELLANEOUS) ×1 IMPLANT
SPLINT PLASTER CAST XFAST 4X15 (CAST SUPPLIES) IMPLANT
SPLINT PLASTER XTRA FAST SET 4 (CAST SUPPLIES) ×10
SPONGE GAUZE 4X4 12PLY (GAUZE/BANDAGES/DRESSINGS) ×2 IMPLANT
STOCKINETTE 4X48 STRL (DRAPES) ×2 IMPLANT
SUT ETHILON 3 0 PS 1 (SUTURE) IMPLANT
SUT ETHILON 4 0 PS 2 18 (SUTURE) ×2 IMPLANT
SUT MERSILENE 4 0 P 3 (SUTURE) ×1 IMPLANT
SUT VIC AB 3-0 PS1 18 (SUTURE)
SUT VIC AB 3-0 PS1 18XBRD (SUTURE) IMPLANT
SUT VICRYL 4-0 PS2 18IN ABS (SUTURE) ×1 IMPLANT
SYR BULB 3OZ (MISCELLANEOUS) ×2 IMPLANT
SYR CONTROL 10ML LL (SYRINGE) ×1 IMPLANT
TOWEL OR 17X24 6PK STRL BLUE (TOWEL DISPOSABLE) ×3 IMPLANT
UNDERPAD 30X30 INCONTINENT (UNDERPADS AND DIAPERS) ×2 IMPLANT
WATER STERILE IRR 1000ML POUR (IV SOLUTION) ×1 IMPLANT

## 2011-06-14 NOTE — Transfer of Care (Signed)
Immediate Anesthesia Transfer of Care Note  Patient: [name]  Procedure(s) Performed:  OPEN REDUCTION INTERNAL FIXATION (ORIF) METACARPAL (FINGER) FRACTURE - right ring  Patient Location: PACU  Anesthesia Type: General  Level of Consciousness: awake, alert  and oriented  Airway & Oxygen Therapy: Patient Spontanous Breathing and Patient connected to face mask oxygen  Post-op Assessment: Report given to PACU RN and Post -op Vital signs reviewed and stable  Post vital signs: Reviewed and stable  Complications: No apparent anesthesia complications

## 2011-06-14 NOTE — Anesthesia Preprocedure Evaluation (Signed)
Anesthesia Evaluation  Patient identified by MRN, date of birth, ID band Patient awake    Reviewed: Allergy & Precautions, H&P , NPO status , Patient's Chart, lab work & pertinent test results  Airway Mallampati: I TM Distance: >3 FB Neck ROM: Full    Dental No notable dental hx. (+) Teeth Intact   Pulmonary neg pulmonary ROS,  clear to auscultation  Pulmonary exam normal       Cardiovascular hypertension, neg cardio ROS Regular Normal    Neuro/Psych Negative Neurological ROS  Negative Psych ROS   GI/Hepatic negative GI ROS, Neg liver ROS, GERD-  Controlled,  Endo/Other  Negative Endocrine ROS  Renal/GU negative Renal ROS  Genitourinary negative   Musculoskeletal   Abdominal   Peds  Hematology negative hematology ROS (+)   Anesthesia Other Findings   Reproductive/Obstetrics negative OB ROS                           Anesthesia Physical Anesthesia Plan  ASA: II  Anesthesia Plan: General   Post-op Pain Management:    Induction: Intravenous  Airway Management Planned: LMA  Additional Equipment:   Intra-op Plan:   Post-operative Plan: Extubation in OR  Informed Consent: I have reviewed the patients History and Physical, chart, labs and discussed the procedure including the risks, benefits and alternatives for the proposed anesthesia with the patient or authorized representative who has indicated his/her understanding and acceptance.     Plan Discussed with: CRNA  Anesthesia Plan Comments:         Anesthesia Quick Evaluation

## 2011-06-14 NOTE — H&P (Signed)
  [name] is an 56 y.o. female.   Chief Complaint: right ring metacarpal fracture HPI: 56 yo rhd female fell on right hand 06/05/11.  Seen at [facility] 06/07/11 where XR showed fracture of right ring finger metacarpal.  No previous injury reported.  Past Medical History  Diagnosis Date  . No pertinent past medical history   . Hypertension     off meds after wt loss  . Ulcerative colitis     hx  . Osteopenia   . GERD (gastroesophageal reflux disease)     hx-contrlled now    Past Surgical History  Procedure Date  . Hernia repair     lt ing   . Tonsillectomy   . Fracture surgery 2004    fx lt thumb  . Upper gastrointestinal endoscopy   . Colonoscopy     History reviewed. No pertinent family history. Social History:  reports that she has never smoked. She does not have any smokeless tobacco history on file. She reports that she drinks alcohol. She reports that she does not use illicit drugs.  Allergies: No Known Allergies  Medications Prior to Admission  Medication Dose Route Frequency Provider Last Rate Last Dose  . ceFAZolin (ANCEF) IVPB 1 g/50 mL premix  1 g Intravenous 60 min Pre-Op       . chlorhexidine (HIBICLENS) 4 % liquid 4 application  60 mL Topical Once       . lactated ringers infusion   Intravenous Continuous Raiford Simmonds, MD 10 mL/hr at 06/14/11 1610     Medications Prior to Admission  Medication Sig Dispense Refill  . HYDROcodone-acetaminophen (VICODIN) 5-500 MG per tablet Take 1-2 tablets by mouth every 6 (six) hours as needed for pain.  15 tablet  0  . lisinopril (PRINIVIL,ZESTRIL) 10 MG tablet Take 10 mg by mouth daily.      . simvastatin (ZOCOR) 10 MG tablet Take 10 mg by mouth at bedtime.        Results for orders placed during the hospital encounter of 06/14/11 (from the past 48 hour(s))  POCT HEMOGLOBIN-HEMACUE     Status: Abnormal   Collection Time   06/14/11  9:57 AM      Component Value Range Comment   Hemoglobin 15.4 (*) 12.0 - 15.0 (g/dL)      No results found.   A comprehensive review of systems was negative except for: Eyes: positive for contacts/glasses Ears, nose, mouth, throat, and face: positive for hearing loss  Blood pressure 148/81, pulse 68, temperature 97.9 F (36.6 C), temperature source Oral, resp. rate 16, height 4\' 10"  (1.473 m), weight 54.432 kg (120 lb), SpO2 98.00%.  General appearance: alert, cooperative and appears stated age Head: Normocephalic, without obvious abnormality, atraumatic Neck: supple, symmetrical, trachea midline Resp: clear to auscultation bilaterally Cardio: regular rate and rhythm GI: soft, non-tender; bowel sounds normal; no masses,  no organomegaly Extremities: light touch sensation intact in all fingertips.  +epl/fpl/io. Pulses: 2+ and symmetric Skin: Skin color, texture, turgor normal. No rashes or lesions Neurologic: Grossly normal Incision/Wound: Na  Assessment/Plan Right ring finger metacarpal head fracture.  Recommend ORIF.  Risks, benefits, and alternatives discussed and patient agrees with plan of care.  Zaccary Creech R 06/14/2011, 10:47 AM

## 2011-06-14 NOTE — Anesthesia Procedure Notes (Signed)
Procedure Name: LMA Insertion Performed by: Sharyne Richters Pre-anesthesia Checklist: Patient identified, Timeout performed, Emergency Drugs available, Suction available and Patient being monitored Patient Re-evaluated:Patient Re-evaluated prior to inductionOxygen Delivery Method: Circle System Utilized Preoxygenation: Pre-oxygenation with 100% oxygen Intubation Type: IV induction Ventilation: Mask ventilation without difficulty LMA: LMA inserted LMA Size: 5.0 Number of attempts: 1 Placement Confirmation: breath sounds checked- equal and bilateral and positive ETCO2 Tube secured with: Tape Dental Injury: Teeth and Oropharynx as per pre-operative assessment

## 2011-06-14 NOTE — Brief Op Note (Signed)
06/14/2011  12:38 PM  PATIENT:  [name]  56 y.o. female  PRE-OPERATIVE DIAGNOSIS:  right ring metacarpal fracture  POST-OPERATIVE DIAGNOSIS:  right ring metacarpal fracture  PROCEDURE:  Procedure(s): OPEN REDUCTION INTERNAL FIXATION (ORIF) METACARPAL (FINGER) FRACTURE  SURGEON:  Surgeon(s): Tami Ribas, MD  PHYSICIAN ASSISTANT:   ASSISTANTS: none   ANESTHESIA:   general  EBL:  Total I/O In: 1200 [I.V.:1200] Out: -   BLOOD ADMINISTERED:none  DRAINS: none   LOCAL MEDICATIONS USED:  MARCAINE 5 CC  SPECIMEN:  No Specimen  DISPOSITION OF SPECIMEN:  N/A  COUNTS:  YES  TOURNIQUET:  * Missing tourniquet times found for documented tourniquets in log:  [id number] *  DICTATION: .Other Dictation: Dictation Number [id number]  PLAN OF CARE: Discharge to home after PACU  PATIENT DISPOSITION:  PACU - hemodynamically stable.

## 2011-06-14 NOTE — Op Note (Signed)
[name], [name] NO.:  [id number]  MEDICAL RECORD NO.:  [id number]  LOCATION:                                 FACILITY:  PHYSICIAN:  Betha Loa, MD        DATE OF BIRTH:  12-Aug-1955  DATE OF PROCEDURE:  06/14/2011 DATE OF DISCHARGE:                              OPERATIVE REPORT   PREOPERATIVE DIAGNOSIS:  Right ring finger metacarpal head intra- articular fracture.  POSTOPERATIVE DIAGNOSIS:  Right ring finger metacarpal head intra- articular fracture.  PROCEDURE:  Open reduction and internal fixation of right ring finger metacarpal head intra-articular fracture.  SURGEON:  Betha Loa, MD  ASSISTANTS:  None.  ANESTHESIA:  General.  IV FLUIDS:  Per anesthesia flow sheet.  ESTIMATED BLOOD LOSS:  Minimal.  COMPLICATIONS:  None.  SPECIMENS:  None.  TOURNIQUET TIME:  Approximately 65 minutes.  DISPOSITION:  Stable to PACU.  INDICATIONS:  [name] is a 56 year old right-hand-dominant female who slipped on a step approximately 1 week ago.  She had injury to her hand. Two days later, she presented to the urgent care where radiographs were taken revealing a ring finger metacarpal head fracture.  She was placed in splint, followed up with me in the office.  On examination, she had intact sensation, capillary refill in all fingertips.  On radiographs, she has a metacarpal head fracture that is intra-articular in nature with shortening and displacement.  I recommended to Ms. Eddie operative reduction and fixation.  Risks, benefits, and alternatives of surgery were discussed including the risk of blood loss, infection, damage to nerves, vessels, tendons, ligaments, bone, failure of surgery, need for additional surgery, complications with wound healing, continued pain, nonunion, malunion, stiffness.  She voiced understanding of these risks and elected to proceed.  OPERATIVE COURSE:  After being identified preoperatively by myself, the patient and  I agreed upon procedure and site of the procedure.  The surgical site was marked.  Risks, benefits, and alternatives of surgery were reviewed, and she wished to proceed.  Surgical consent was then signed.  She was given 1 g of IV Ancef as preoperative antibiotic prophylaxis.  She was transferred to the operating room and placed on the operating table in supine position with the right upper extremity on arm board.  General esthesia was induced by the anesthesiologist.  Right upper extremity was prepped and draped in normal sterile orthopedic fashion.  Surgical pause was performed between the surgeons, anesthesia, and operating staff, and all were in agreement with the patient, procedure, and site of the procedure.  Tourniquet to the proximal aspect of the extremity was inflated to 250 mmHg after exsanguination of limb with an Esmarch bandage.  An incision was made over the distal aspect of the ring finger metacarpal.  This carried into subcutaneous tissues by spreading technique.  Bipolar electrocautery was used for hemostasis. All neurovascular structures were protected.  Junctura to the small finger was incised.  The proximal aspect to the sagittal band had to be incised as well.  The periosteum was incised and elevated using the Therapist, nutritional.  Fracture site was easily identified.  It was cleared of clot.  In the joint,  which was entered, the fracture line was visualized.  There was some damage to the articular cartilage.  There was some sheeting of the cartilage and this was removed.  Reduction was performed.  There was less than 1 mm step-off remaining.  A tenaculum was used for provisional fixation.  A 1.5-mm screw from the ALPS set was selected.  A standard AO drilling and measuring technique was used.  It was drilled from the distal fragment into the more proximal fragment. Good purchase was obtained.  An additional screw was placed just distal to this.  This was near the origin of  the ulnar collateral ligament. The ligament was left intact.  Again, the standard AO drilling and measuring technique was used.  Adequate purchase was obtained in the screw.  An additional 1.5-mm screw was then drilled from the proximal fragment into the distal fragment, more proximal to the other 2 screws. Again, standard AO drilling and measuring technique was used.  The C-arm was used in AP, lateral, and oblique projections throughout the case to aid in reduction and position of hardware.  It was felt that this was adequate fixation to stabilize the fracture.  The wound was copiously irrigated with sterile saline.  The periosteum was repaired with 4-0 Vicryl suture in a running fashion.  The sagittal band and juncture were repaired with 4-0 Mersilene.  The skin was closed with 4-0 nylon in a horizontal mattress fashion.  The wound was injected with 5 mL of 0.25% plain Marcaine to aid in postoperative analgesia.  The wound was dressed with sterile Xeroform, 4x4s, and wrapped with a Kerlix bandage.  A volar and dorsal slab splint was placed with the wrist in resting position, the MPs flexed and the IPs extended including the long, ring, and small fingers.  The tourniquet was deflated at approximately 65 minutes.  The fingertips were pink with brisk capillary refill after deflation of the tourniquet.  The operative drapes were broken down, and the patient was awoken from anesthesia safely.  She was transferred back to stretcher and taken to PACU in stable condition.  I will see her back in the office in 1 week for postoperative followup.  I will give her Percocet 5/325 one to two p.o. q.6 hours p.r.n. pain, dispensed #50.     Betha Loa, MD     KK/MEDQ  D:  06/14/2011  T:  06/14/2011  Job:  [id number]

## 2011-06-14 NOTE — Anesthesia Postprocedure Evaluation (Signed)
Anesthesia Post Note  Patient: [name]  Procedure(s) Performed:  OPEN REDUCTION INTERNAL FIXATION (ORIF) METACARPAL (FINGER) FRACTURE - right ring  Anesthesia type: General  Patient location: PACU  Post pain: Pain level controlled  Post assessment: Patient's Cardiovascular Status Stable  Last Vitals:  Filed Vitals:   06/14/11 1330  BP: 133/95  Pulse: 61  Temp:   Resp: 12    Post vital signs: Reviewed and stable  Level of consciousness: alert  Complications: No apparent anesthesia complications

## 2011-06-14 NOTE — Op Note (Signed)
Dictation [id number]

## 2011-06-26 ENCOUNTER — Encounter (HOSPITAL_BASED_OUTPATIENT_CLINIC_OR_DEPARTMENT_OTHER): Payer: Self-pay | Admitting: Orthopedic Surgery

## 2011-06-28 ENCOUNTER — Ambulatory Visit: Payer: 59

## 2011-06-29 ENCOUNTER — Ambulatory Visit
Admission: RE | Admit: 2011-06-29 | Discharge: 2011-06-29 | Disposition: A | Discharge status: Home / Self Care | Payer: 59 | Source: Ambulatory Visit | Attending: Obstetrics | Admitting: Obstetrics

## 2011-06-29 DIAGNOSIS — Z1231 Encounter for screening mammogram for malignant neoplasm of breast: Secondary | ICD-10-CM

## 2011-08-28 ENCOUNTER — Encounter: Payer: Self-pay | Admitting: Gastroenterology

## 2012-03-03 ENCOUNTER — Encounter: Payer: Self-pay | Admitting: Family Medicine

## 2012-03-03 ENCOUNTER — Ambulatory Visit (INDEPENDENT_AMBULATORY_CARE_PROVIDER_SITE_OTHER): Payer: 59 | Admitting: Family Medicine

## 2012-03-03 VITALS — BP 180/100 | Temp 98.5°F | Ht <= 58 in | Wt 126.0 lb

## 2012-03-03 DIAGNOSIS — E785 Hyperlipidemia, unspecified: Secondary | ICD-10-CM

## 2012-03-03 DIAGNOSIS — I10 Essential (primary) hypertension: Secondary | ICD-10-CM

## 2012-03-03 LAB — LIPID PANEL
Cholesterol: 221 mg/dL — ABNORMAL HIGH (ref 0–200)
HDL: 81 mg/dL (ref >39.00)
Total CHOL/HDL Ratio: 3
Triglycerides: 79 mg/dL (ref 0.0–149.0)
VLDL: 15.8 mg/dL (ref 0.0–40.0)

## 2012-03-03 LAB — LDL CHOLESTEROL, DIRECT: Direct LDL: 135.6 mg/dL

## 2012-03-03 LAB — BASIC METABOLIC PANEL
BUN: 14 mg/dL (ref 6–23)
CO2: 28 mEq/L (ref 19–32)
Calcium: 9.2 mg/dL (ref 8.4–10.5)
Chloride: 106 mEq/L (ref 96–112)
Creatinine, Ser: 0.6 mg/dL (ref 0.4–1.2)
GFR: 102.16 mL/min (ref >60.00)
Glucose, Bld: 84 mg/dL (ref 70–99)
Potassium: 4.5 mEq/L (ref 3.5–5.1)
Sodium: 142 mEq/L (ref 135–145)

## 2012-03-03 MED ORDER — LISINOPRIL 10 MG PO TABS: 10.0000 mg | ORAL_TABLET | Freq: Every day | ORAL | Status: DC

## 2012-03-03 NOTE — Patient Instructions (Addendum)
-  As we discussed, we have prescribed a new medication for you at this appointment. We discussed the common and serious potential adverse effects of this medication and you can review these and more with the pharmacist when you pick up your medication.  Please follow the instructions for use carefully and notify us immediately if you have any problems taking this medication.  -We have ordered labs or studies at this visit. It usually takes 1-2 weeks for results and processing. We will contact you with instructions IF your results are abnormal. Normal results will be released to your [facility] in 1-2 weeks. If you have not heard from [place] or can not find your results in [facility] in 2 weeks please contact our office.  -We recommend the following healthy lifestyle measures: - eat a healthy diet consisting of lots of vegetables, fruits, beans, nuts, seeds, healthy meats such as white chicken and fish and whole grains.  - avoid fried foods, fast food, processed foods, sodas, red meet and other fattening foods.  - get a least 150 minutes of aerobic exercise per week.   Thank you for enrolling in MyChart. Please follow the instructions below to securely access your online medical record. MyChart allows you to send messages to your doctor, view your test results, renew your prescriptions, schedule appointments, and more.  How Do I Sign Up? 1. In your Internet browser, go to [email or web address] 2. Click on the New  User? link in the Sign In box.  3. Enter your MyChart Access Code exactly as it appears below. You will not need to use this code after you have completed the sign-up process. If you do not sign up before the expiration date, you must request a new code. MyChart Access Code: [email or web address] Expires: 04/02/2012  9:57 AM  4. Enter the last four digits of your Social Security Number (xxxx) and Date of Birth (mm/dd/yyyy) as indicated and click Next. You will be taken to the next sign-up  page. 5. Create a MyChart ID. This will be your MyChart login ID and cannot be changed, so think of one that is secure and easy to remember. 6. Create a MyChart password. You can change your password at any time. 7. Enter your Password Reset Question and Answer and click Next. This can be used at a later time if you forget your password.  8. Select your communication preference, and if applicable enter your e-mail address. You will receive e-mail notification when new information is available in MyChart by choosing to receive e-mail notifications and filling in your e-mail. 9. Click Sign In. You can now view your medical record.   Additional Information If you have questions, you can email [email or web address] or call [phone] to talk to our MyChart staff. Remember, MyChart is NOT to be used for urgent needs. For medical emergencies, dial 911.

## 2012-03-03 NOTE — Progress Notes (Signed)
Chief Complaint  Patient presents with  . Establish Care    HPI:   [name] is here to establish care and to address her HTN. 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 Jarold Motto for her UC. Reports this is stable. Not on any medications for this at this point. Occ loose stools with particular foods. Thinks he may restart medication.  She usually only takes actonel for her osteoporosis now. Last dexa 2 years ago and will get with gyn. Has gyn that does her yearly and UTD on paps and mammos. ROS: See pertinent positives and negatives per HPI.  Past Medical History  Diagnosis Date  . No pertinent past medical history   . Hypertension     off meds after wt loss  . Ulcerative colitis     hx  . Osteopenia   . GERD (gastroesophageal reflux disease)     hx-contrlled now  . Blood in stool   . Colon polyp     No family history on file.  History   Social History  . Marital Status: Married    Spouse Name: N/A    Number of Children: N/A  . Years of Education: N/A   Social History Main Topics  . Smoking status: Never Smoker   . Smokeless tobacco: None  . Alcohol Use: Yes     rare  . Drug Use: No  . Sexually Active:    Other Topics Concern  . None   Social History Narrative  . None    Current outpatient prescriptions:Multiple Vitamin (MULTIVITAMIN) tablet, Take 1 tablet by mouth daily., Disp: , Rfl: ;  risedronate (ACTONEL) 150 MG tablet, Take 150 mg by mouth  every 30 (thirty) days. with water on empty stomach, nothing by mouth or lie down for next 30 minutes., Disp: , Rfl: ;  VITAMIN D, CHOLECALCIFEROL, PO, Take by mouth., Disp: , Rfl:  lisinopril (PRINIVIL,ZESTRIL) 10 MG tablet, Take 1 tablet (10 mg total) by mouth daily., Disp: 30 tablet, Rfl: 1;  simvastatin (ZOCOR) 10 MG tablet, Take 10 mg by mouth at bedtime., Disp: , Rfl: ;  DISCONTD: lisinopril (PRINIVIL,ZESTRIL) 10 MG tablet, Take 10 mg by mouth daily., Disp: , Rfl:   EXAM:  Filed Vitals:   03/03/12 0931  BP: 180/100  Temp: 98.5 F (36.9 C)   Repeat BP 180/90 Body mass index is 27.51 kg/(m^2).  GENERAL: vitals reviewed and listed above, alert, oriented, appears well hydrated and in no acute distress  HEENT: atraumatic, conjunttiva clear, no obvious abnormalities on inspection of external nose and ears  NECK: no obvious masses on inspection  LUNGS: clear to auscultation bilaterally, no wheezes, rales or rhonchi, good air movement  CV: HRRR, I/VI SEM, no peripheral edema  MS: moves all extremities without noticeable abnormality  PSYCH: pleasant and cooperative, no obvious depression or anxiety  ASSESSMENT AND PLAN:  Discussed the following assessment and plan:  1. HYPERTENSION  Basic metabolic panel, Hemoglobin A1c  2. Hyperlipidemia  Lipid  Panel   -long standing htn on medication in the past then stopped after weight loss. no alarm symptoms, restarting lisinopril risks/benefits discussed. Warned to see doctor immediatley if any change in HAs, vision changes, CP, SOB or other concerns. -pt to check BP and keep log - will call if > 170/90 -labs per orders -Patient advised to return or notify a doctor immediately if symptoms worsen or persist or new concerns arise.  Patient Instructions  -As we discussed, we have prescribed a new medication for you at this appointment. We discussed the common and serious potential adverse effects of this medication and you can review these  and more with the pharmacist when you pick up your medication.  Please follow the instructions for use carefully and notify us immediately if you have any problems taking this medication.  -We have ordered labs or studies at this visit. It usually takes 1-2 weeks for results and processing. We will contact you with instructions IF your results are abnormal. Normal results will be released to your [facility] in 1-2 weeks. If you have not heard from [place] or can not find your results in [facility] in 2 weeks please contact our office.  -We recommend the following healthy lifestyle measures: - eat a healthy diet consisting of lots of vegetables, fruits, beans, nuts, seeds, healthy meats such as white chicken and fish and whole grains.  - avoid fried foods, fast food, processed foods, sodas, red meet and other fattening foods.  - get a least 150 minutes of aerobic exercise per week.   Thank you for enrolling in MyChart. Please follow the instructions below to securely access your online medical record. MyChart allows you to send messages to your doctor, view your test results, renew your prescriptions, schedule appointments, and more.  How Do I Sign Up? 1. In your Internet browser, go to [email or web address] 2. Click on the New  User? link in the Sign In box.  3. Enter your MyChart Access Code exactly as it appears below. You will not need to use this code after you have completed the sign-up process. If you do not sign up before the expiration date, you must request a new code. MyChart Access Code: [email or web address] Expires: 04/02/2012  9:57 AM  4. Enter the last four digits of your Social Security Number (xxxx) and Date of Birth (mm/dd/yyyy) as indicated and click Next. You will be taken to the next sign-up page. 5. Create a MyChart ID. This will be your MyChart login ID and cannot be changed, so think of one that is secure and easy to remember. 6. Create a MyChart password. You can change  your password at any time. 7. Enter your Password Reset Question and Answer and click Next. This can be used at a later time if you forget your password.  8. Select your communication preference, and if applicable enter your e-mail address. You will receive e-mail notification when new information is available in MyChart by choosing to receive e-mail notifications and filling in your e-mail. 9. Click Sign In. You can now view your medical record.   Additional Information If you have questions, you can email [email or web address] or call [phone] to talk to our MyChart staff. Remember, MyChart is NOT to be used for urgent needs. For medical emergencies, dial 911.            Kriste Basque R.

## 2012-03-04 NOTE — Progress Notes (Signed)
Quick Note:  Left a message for pt to return call. ______ 

## 2012-03-04 NOTE — Progress Notes (Signed)
Quick Note:  Called and spoke with pt and pt is aware. ______ 

## 2012-03-21 ENCOUNTER — Ambulatory Visit (INDEPENDENT_AMBULATORY_CARE_PROVIDER_SITE_OTHER): Payer: 59 | Admitting: Gastroenterology

## 2012-03-21 ENCOUNTER — Encounter: Payer: Self-pay | Admitting: Gastroenterology

## 2012-03-21 VITALS — BP 120/80 | HR 80 | Wt 134.0 lb

## 2012-03-21 DIAGNOSIS — K625 Hemorrhage of anus and rectum: Secondary | ICD-10-CM

## 2012-03-21 DIAGNOSIS — R197 Diarrhea, unspecified: Secondary | ICD-10-CM

## 2012-03-21 DIAGNOSIS — K648 Other hemorrhoids: Secondary | ICD-10-CM

## 2012-03-21 DIAGNOSIS — Z86010 Personal history of colonic polyps: Secondary | ICD-10-CM

## 2012-03-21 DIAGNOSIS — K5730 Diverticulosis of large intestine without perforation or abscess without bleeding: Secondary | ICD-10-CM

## 2012-03-21 DIAGNOSIS — K5010 Crohn's disease of large intestine without complications: Secondary | ICD-10-CM

## 2012-03-21 MED ORDER — MESALAMINE 1000 MG RE SUPP: 1000.0000 mg | Freq: Every day | RECTAL | Status: DC

## 2012-03-21 MED ORDER — PEG-KCL-NACL-NASULF-NA ASC-C 100 G PO SOLR: 1.0000 | Freq: Once | ORAL | Status: DC

## 2012-03-21 NOTE — Progress Notes (Signed)
History of Present Illness:  This is a 56 year old Caucasian female with a long history of colon polyps for many years. Her last colonoscopy in our office was 3 years ago at which time she had severe diverticulosis in sigmoid colon segmental colitis. She is treated with oral amino salicylates with good response but has refused to take these medications long-term. She now relates episodes of bloody mucus every several weeks related to fatty food intolerance with associated loose stools. In general, she relates she has no abdominal pain, bowel or regularity, a regular rectal bleeding. There also is no history of systemic, upper GI or hepatobiliary complaints. Only medication this time is Prinivil, Actonel, and vitamin D. Family history is noncontributory.  I have reviewed this patient's present history, medical and surgical past history, allergies and medications.     ROS: The remainder of the 10 point ROS is negative     Physical Exam: Blood pressure 120/80, pulse 50 and regular, and weight 134 pounds. General well developed well nourished patient in no acute distress, appearing her stated age Eyes PERRLA, no icterus, fundoscopic exam per opthamologist Skin no lesions noted Neck supple, no adenopathy, no thyroid enlargement, no tenderness Chest clear to percussion and auscultation Heart no significant murmurs, gallops or rubs noted Abdomen no hepatosplenomegaly masses or tenderness, BS normal.  Rectal inspection normal no fissures, or fistulae noted.  No masses or tenderness on digital exam. Stool guaiac negative. Extremities no acute joint lesions, edema, phlebitis or evidence of cellulitis. Neurologic patient oriented x 3, cranial nerves intact, no focal neurologic deficits noted. Psychological mental status normal and normal affect.  Anoscopy: Perianal tags noted. There appears to be a posterior lateral inflamed hemorrhoid without active bleeding. I cannot appreciate fissures, or fistulae,  or any obvious mucosa abnormalities.  Assessment and plan: Severe diverticulosis with a history of recurrent segmental colitis. I have advised her to follow a high fiber diet with daily Metamucil, and I have tried to reiterate the importance of bulky and agents to her process. I placed her on Canasa 1 g suppositories at bedtime also pending colonoscopy followup. Review of her colonoscopy for from Dr. Crissie Sickles over 10 years ago showed only hyperplastic polyps. The patient does not want to take long-term aminosalicylate therapy since she feels that overall she is doing extremely well.  Encounter Diagnoses  Name Primary?  . Rectal bleeding Yes  . Diarrhea

## 2012-03-21 NOTE — Patient Instructions (Addendum)
You have been scheduled for a colonoscopy with propofol. Please follow written instructions given to you at your visit today.  Please pick up your prep kit at the pharmacy within the next 1-3 days. If you use inhalers (even only as needed) or a CPAP machine, please bring them with you on the day of your procedure.  We have sent the following medications to your pharmacy for you to pick up at your convenience: Canasa.   Start taking Metamucil over the counter daily fiber supplement.  cc: Kriste Basque, DO

## 2012-04-09 ENCOUNTER — Ambulatory Visit (INDEPENDENT_AMBULATORY_CARE_PROVIDER_SITE_OTHER): Payer: 59 | Admitting: Family Medicine

## 2012-04-09 ENCOUNTER — Encounter: Payer: Self-pay | Admitting: Family Medicine

## 2012-04-09 VITALS — BP 130/70 | HR 72 | Temp 97.9°F | Wt 122.0 lb

## 2012-04-09 DIAGNOSIS — E785 Hyperlipidemia, unspecified: Secondary | ICD-10-CM

## 2012-04-09 DIAGNOSIS — I10 Essential (primary) hypertension: Secondary | ICD-10-CM

## 2012-04-09 DIAGNOSIS — K5289 Other specified noninfective gastroenteritis and colitis: Secondary | ICD-10-CM

## 2012-04-09 LAB — BASIC METABOLIC PANEL
BUN: 12 mg/dL (ref 6–23)
Chloride: 103 mEq/L (ref 96–112)
Glucose, Bld: 71 mg/dL (ref 70–99)
Potassium: 3.5 mEq/L (ref 3.5–5.1)

## 2012-04-09 MED ORDER — LISINOPRIL 10 MG PO TABS: 10.0000 mg | ORAL_TABLET | Freq: Every day | ORAL | Status: DC

## 2012-04-09 NOTE — Progress Notes (Addendum)
Chief Complaint  Patient presents with  . 1 month f/u    blood pressure- pt states she feels beter     HPI:  Follow up:  HTN: -longstanding -restarted lisinopril last visit -doing weight watchers and continues to lose weight -denies: CP, swelling, SOB  HLD: -LDL 135, HDL 81, T221 -working on diet and exercise and plan to recheck 3 months -wants lab results  HX of recurrent segmental colitis, hemorrhoid: -recently saw Dr. Jarold Motto -advised high fiber diet, daily metameucil, Canasa and a colonoscopy -pt wonders about utility of doing another colonoscopy as not having any abd pain, fevers, blood or mucus in stools or change in bowels and feels she has been doing well for some time  Bone Health: -was to see her gyn doctor about this -due in January to see and get mammo and bone dexa  ROS: See pertinent positives and negatives per HPI.  Past Medical History  Diagnosis Date  . Hypertension     off meds after wt loss  . Ulcerative colitis     hx  . Osteopenia   . GERD (gastroesophageal reflux disease)     hx-contrlled now  . Blood in stool   . Colon polyp   . IBD (inflammatory bowel disease)   . Hemorrhoids   . Diverticulosis   . Vitamin B12 deficiency     Family History  Problem Relation Age of Onset  . Lymphoma Mother   . Lymphoma Father     History   Social History  . Marital Status: Married    Spouse Name: N/A    Number of Children: N/A  . Years of Education: N/A   Social History Main Topics  . Smoking status: Never Smoker   . Smokeless tobacco: Never Used  . Alcohol Use: Yes     Comment: rare  . Drug Use: No  . Sexually Active: None   Other Topics Concern  . None   Social History Narrative  . None    Current outpatient prescriptions:lisinopril (PRINIVIL,ZESTRIL) 10 MG tablet, Take 1 tablet (10 mg total) by mouth daily., Disp: 90 tablet, Rfl: 3;  Multiple Vitamin (MULTIVITAMIN) tablet, Take 1 tablet by mouth daily., Disp: , Rfl: ;  peg 3350  powder (MOVIPREP) 100 G SOLR, Take 1 kit (100 g total) by mouth once., Disp: 1 kit, Rfl: 0 risedronate (ACTONEL) 150 MG tablet, Take 150 mg by mouth every 30 (thirty) days. with water on empty stomach, nothing by mouth or lie down for next 30 minutes., Disp: , Rfl: ;  VITAMIN D, CHOLECALCIFEROL, PO, Take by mouth., Disp: , Rfl: ;  [DISCONTINUED] lisinopril (PRINIVIL,ZESTRIL) 10 MG tablet, Take 1 tablet (10 mg total) by mouth daily., Disp: 30 tablet, Rfl: 1 mesalamine (CANASA) 1000 MG suppository, Place 1 suppository (1,000 mg total) rectally at bedtime., Disp: 14 suppository, Rfl: 0  EXAM:  Filed Vitals:   04/09/12 1010  BP: 130/70  Pulse: 72  Temp: 97.9 F (36.6 C)    There is no height on file to calculate BMI.  GENERAL: vitals reviewed and listed above, alert, oriented, appears well hydrated and in no acute distress  HEENT: atraumatic, conjunttiva clear, no obvious abnormalities on inspection of external nose and ears  NECK: no obvious masses on inspection  LUNGS: clear to auscultation bilaterally, no wheezes, rales or rhonchi, good air movement  CV: HRRR, no peripheral edema  MS: moves all extremities without noticeable abnormality  PSYCH: pleasant and cooperative, no obvious depression or anxiety  ASSESSMENT AND  PLAN:  Discussed the following assessment and plan:  1. Hypertension  BMP with eGFR, continue lisinopril and lifestyle changes, congratulated on continued healhty lifestyle  2. INFLAMMATORY BOWEL DISEASE  Advised she discuss colonoscopy questions with Dr. Jarold Motto.  3. Dyslipidemia  Diet and exercise. Will recheck fasting at follow up in e months.   -Patient advised to return or notify a doctor immediately if new concerns arise.  Patient Instructions  -Continue diet and exercise and lisinopril.  -We have ordered labs or studies at this visit. It can take up to 1-2 weeks for results and processing. We will contact you with instructions IF your results are  abnormal. Normal results will be released to your [facility]. If you have not heard from [place] or can not find your results in [facility] in 2 weeks please contact our office.   Follow up in 3 months          KIM, [facility] R.

## 2012-04-09 NOTE — Patient Instructions (Addendum)
-  Continue diet and exercise and lisinopril.  -We have ordered labs or studies at this visit. It can take up to 1-2 weeks for results and processing. We will contact you with instructions IF your results are abnormal. Normal results will be released to your [facility]. If you have not heard from [place] or can not find your results in [facility] in 2 weeks please contact our office.   Follow up in 3 months

## 2012-04-09 NOTE — Addendum Note (Signed)
Addended by: Bonnye Fava on: 04/09/2012 10:33 AM   Modules accepted: Orders

## 2012-04-25 ENCOUNTER — Telehealth: Payer: Self-pay | Admitting: Gastroenterology

## 2012-04-28 ENCOUNTER — Encounter: Payer: 59 | Admitting: Gastroenterology

## 2012-05-02 ENCOUNTER — Encounter (HOSPITAL_COMMUNITY): Payer: Self-pay | Admitting: Anesthesiology

## 2012-05-02 ENCOUNTER — Inpatient Hospital Stay (HOSPITAL_COMMUNITY)
Admission: EM | Admit: 2012-05-02 | Discharge: 2012-05-06 | DRG: 482 | Disposition: A | Discharge status: Skilled Nursing Facility | Payer: PRIVATE HEALTH INSURANCE | Attending: Internal Medicine | Admitting: Internal Medicine

## 2012-05-02 ENCOUNTER — Encounter (HOSPITAL_COMMUNITY): Payer: Self-pay | Admitting: Emergency Medicine

## 2012-05-02 ENCOUNTER — Inpatient Hospital Stay (HOSPITAL_COMMUNITY): Payer: PRIVATE HEALTH INSURANCE | Admitting: Anesthesiology

## 2012-05-02 ENCOUNTER — Encounter (HOSPITAL_COMMUNITY)
Admission: EM | Disposition: A | Discharge status: Skilled Nursing Facility | Payer: Self-pay | Source: Home / Self Care | Attending: Internal Medicine

## 2012-05-02 ENCOUNTER — Emergency Department (HOSPITAL_COMMUNITY): Payer: PRIVATE HEALTH INSURANCE

## 2012-05-02 ENCOUNTER — Encounter (HOSPITAL_COMMUNITY): Payer: Self-pay | Admitting: Orthopedic Surgery

## 2012-05-02 ENCOUNTER — Inpatient Hospital Stay (HOSPITAL_COMMUNITY): Payer: PRIVATE HEALTH INSURANCE

## 2012-05-02 DIAGNOSIS — W19XXXA Unspecified fall, initial encounter: Secondary | ICD-10-CM | POA: Diagnosis present

## 2012-05-02 DIAGNOSIS — Z860100 Personal history of colon polyps, unspecified: Secondary | ICD-10-CM

## 2012-05-02 DIAGNOSIS — K219 Gastro-esophageal reflux disease without esophagitis: Secondary | ICD-10-CM | POA: Diagnosis present

## 2012-05-02 DIAGNOSIS — Y9269 Other specified industrial and construction area as the place of occurrence of the external cause: Secondary | ICD-10-CM

## 2012-05-02 DIAGNOSIS — M949 Disorder of cartilage, unspecified: Secondary | ICD-10-CM | POA: Diagnosis present

## 2012-05-02 DIAGNOSIS — I10 Essential (primary) hypertension: Secondary | ICD-10-CM | POA: Diagnosis present

## 2012-05-02 DIAGNOSIS — K5730 Diverticulosis of large intestine without perforation or abscess without bleeding: Secondary | ICD-10-CM

## 2012-05-02 DIAGNOSIS — Q969 Turner's syndrome, unspecified: Secondary | ICD-10-CM

## 2012-05-02 DIAGNOSIS — K5289 Other specified noninfective gastroenteritis and colitis: Secondary | ICD-10-CM | POA: Diagnosis present

## 2012-05-02 DIAGNOSIS — K589 Irritable bowel syndrome without diarrhea: Secondary | ICD-10-CM | POA: Diagnosis present

## 2012-05-02 DIAGNOSIS — M810 Age-related osteoporosis without current pathological fracture: Secondary | ICD-10-CM | POA: Diagnosis present

## 2012-05-02 DIAGNOSIS — K649 Unspecified hemorrhoids: Secondary | ICD-10-CM

## 2012-05-02 DIAGNOSIS — S72142A Displaced intertrochanteric fracture of left femur, initial encounter for closed fracture: Secondary | ICD-10-CM

## 2012-05-02 DIAGNOSIS — S72143A Displaced intertrochanteric fracture of unspecified femur, initial encounter for closed fracture: Principal | ICD-10-CM | POA: Diagnosis present

## 2012-05-02 DIAGNOSIS — Z87798 Personal history of other (corrected) congenital malformations: Secondary | ICD-10-CM

## 2012-05-02 DIAGNOSIS — M899 Disorder of bone, unspecified: Secondary | ICD-10-CM | POA: Diagnosis present

## 2012-05-02 DIAGNOSIS — E538 Deficiency of other specified B group vitamins: Secondary | ICD-10-CM | POA: Diagnosis present

## 2012-05-02 DIAGNOSIS — Y998 Other external cause status: Secondary | ICD-10-CM

## 2012-05-02 HISTORY — DX: Displaced intertrochanteric fracture of left femur, initial encounter for closed fracture: S72.142A

## 2012-05-02 HISTORY — DX: Turner's syndrome, unspecified: Q96.9

## 2012-05-02 HISTORY — PX: INTRAMEDULLARY (IM) NAIL INTERTROCHANTERIC: SHX5875

## 2012-05-02 LAB — BASIC METABOLIC PANEL
BUN: 15 mg/dL (ref 6–23)
CO2: 27 mEq/L (ref 19–32)
Calcium: 9.6 mg/dL (ref 8.4–10.5)
Creatinine, Ser: 0.61 mg/dL (ref 0.50–1.10)

## 2012-05-02 LAB — CBC WITH DIFFERENTIAL/PLATELET
Basophils Absolute: 0 10*3/uL (ref 0.0–0.1)
Basophils Relative: 0 % (ref 0–1)
Eosinophils Relative: 2 % (ref 0–5)
HCT: 46.3 % — ABNORMAL HIGH (ref 36.0–46.0)
Hemoglobin: 15.8 g/dL — ABNORMAL HIGH (ref 12.0–15.0)
Lymphocytes Relative: 30 % (ref 12–46)
MCHC: 34.1 g/dL (ref 30.0–36.0)
MCV: 90.6 fL (ref 78.0–100.0)
Monocytes Absolute: 0.5 10*3/uL (ref 0.1–1.0)
Monocytes Relative: 6 % (ref 3–12)
RDW: 12.8 % (ref 11.5–15.5)

## 2012-05-02 LAB — PROTIME-INR: INR: 0.96 (ref 0.00–1.49)

## 2012-05-02 LAB — TYPE AND SCREEN: ABO/RH(D): AB POS

## 2012-05-02 SURGERY — FIXATION, FRACTURE, INTERTROCHANTERIC, WITH INTRAMEDULLARY ROD
Anesthesia: General | Site: Hip | Laterality: Left | Wound class: Clean

## 2012-05-02 MED ORDER — ENOXAPARIN SODIUM 40 MG/0.4ML ~~LOC~~ SOLN
40.0000 mg | SUBCUTANEOUS | Status: DC
  Administered 2012-05-03 – 2012-05-04 (×2): 40 mg via SUBCUTANEOUS
  Filled 2012-05-02 (×4): qty 0.4

## 2012-05-02 MED ORDER — WARFARIN SODIUM 5 MG PO TABS: 5.0000 mg | ORAL_TABLET | Freq: Every day | ORAL | Status: DC

## 2012-05-02 MED ORDER — COUMADIN BOOK
Freq: Once | Status: AC
  Administered 2012-05-02: 23:00:00
  Filled 2012-05-02: qty 1

## 2012-05-02 MED ORDER — ONDANSETRON HCL 4 MG/2ML IJ SOLN
INTRAMUSCULAR | Status: DC | PRN
  Administered 2012-05-02: 4 mg via INTRAVENOUS

## 2012-05-02 MED ORDER — ACETAMINOPHEN 650 MG RE SUPP: 650.0000 mg | Freq: Four times a day (QID) | RECTAL | Status: DC | PRN

## 2012-05-02 MED ORDER — SODIUM CHLORIDE 0.9 % IV BOLUS (SEPSIS)
500.0000 mL | Freq: Once | INTRAVENOUS | Status: AC
  Administered 2012-05-02: 1000 mL via INTRAVENOUS

## 2012-05-02 MED ORDER — HYDROMORPHONE HCL PF 1 MG/ML IJ SOLN
0.2500 mg | INTRAMUSCULAR | Status: DC | PRN
  Administered 2012-05-02: 0.25 mg via INTRAVENOUS
  Administered 2012-05-02: 0.5 mg via INTRAVENOUS
  Administered 2012-05-02: 0.25 mg via INTRAVENOUS
  Administered 2012-05-02: 0.5 mg via INTRAVENOUS
  Administered 2012-05-02: 0.25 mg via INTRAVENOUS

## 2012-05-02 MED ORDER — METOCLOPRAMIDE HCL 10 MG PO TABS: 5.0000 mg | ORAL_TABLET | Freq: Three times a day (TID) | ORAL | Status: DC | PRN

## 2012-05-02 MED ORDER — SENNA 8.6 MG PO TABS
1.0000 | ORAL_TABLET | Freq: Two times a day (BID) | ORAL | Status: DC
  Administered 2012-05-03 – 2012-05-04 (×4): 8.6 mg via ORAL
  Filled 2012-05-02 (×6): qty 1

## 2012-05-02 MED ORDER — HYDROCODONE-ACETAMINOPHEN 5-325 MG PO TABS
1.0000 | ORAL_TABLET | Freq: Four times a day (QID) | ORAL | Status: DC | PRN
  Administered 2012-05-04 – 2012-05-06 (×9): 2 via ORAL
  Filled 2012-05-02 (×10): qty 2

## 2012-05-02 MED ORDER — ROCURONIUM BROMIDE 100 MG/10ML IV SOLN
INTRAVENOUS | Status: DC | PRN
  Administered 2012-05-02: 40 mg via INTRAVENOUS
  Administered 2012-05-02: 10 mg via INTRAVENOUS

## 2012-05-02 MED ORDER — ALUM & MAG HYDROXIDE-SIMETH 200-200-20 MG/5ML PO SUSP: 30.0000 mL | ORAL | Status: DC | PRN

## 2012-05-02 MED ORDER — SODIUM CHLORIDE 0.9 % IV BOLUS (SEPSIS): 500.0000 mL | Freq: Once | INTRAVENOUS | Status: DC

## 2012-05-02 MED ORDER — METOCLOPRAMIDE HCL 5 MG/ML IJ SOLN: 10.0000 mg | Freq: Once | INTRAMUSCULAR | Status: DC | PRN

## 2012-05-02 MED ORDER — FENTANYL CITRATE 0.05 MG/ML IJ SOLN
INTRAMUSCULAR | Status: DC | PRN
  Administered 2012-05-02: 50 ug via INTRAVENOUS
  Administered 2012-05-02 (×2): 100 ug via INTRAVENOUS

## 2012-05-02 MED ORDER — HYDROMORPHONE HCL PF 1 MG/ML IJ SOLN
INTRAMUSCULAR | Status: AC
  Filled 2012-05-02: qty 1

## 2012-05-02 MED ORDER — PHENYLEPHRINE HCL 10 MG/ML IJ SOLN
10.0000 mg | INTRAVENOUS | Status: DC | PRN
  Administered 2012-05-02: 80 ug/min via INTRAVENOUS

## 2012-05-02 MED ORDER — OXYCODONE HCL 5 MG/5ML PO SOLN: 5.0000 mg | Freq: Once | ORAL | Status: DC | PRN

## 2012-05-02 MED ORDER — POLYETHYLENE GLYCOL 3350 17 G PO PACK
17.0000 g | PACK | Freq: Every day | ORAL | Status: DC | PRN
  Filled 2012-05-02: qty 1

## 2012-05-02 MED ORDER — PHENOL 1.4 % MT LIQD
1.0000 | OROMUCOSAL | Status: DC | PRN
  Filled 2012-05-02: qty 177

## 2012-05-02 MED ORDER — DIAZEPAM 2 MG PO TABS
2.0000 mg | ORAL_TABLET | Freq: Three times a day (TID) | ORAL | Status: DC | PRN
  Administered 2012-05-02: 2 mg via ORAL
  Filled 2012-05-02: qty 1

## 2012-05-02 MED ORDER — DEXTROSE 5 % IV SOLN
3.0000 g | INTRAVENOUS | Status: AC
  Administered 2012-05-02: 2 g via INTRAVENOUS
  Filled 2012-05-02 (×2): qty 3000

## 2012-05-02 MED ORDER — LACTATED RINGERS IV SOLN
INTRAVENOUS | Status: DC | PRN
  Administered 2012-05-02: 17:00:00 via INTRAVENOUS

## 2012-05-02 MED ORDER — WARFARIN VIDEO
Freq: Once | Status: AC
  Administered 2012-05-03: 10:00:00

## 2012-05-02 MED ORDER — LACTATED RINGERS IV SOLN
INTRAVENOUS | Status: DC
  Administered 2012-05-02: 17:00:00 via INTRAVENOUS

## 2012-05-02 MED ORDER — OXYCODONE HCL 5 MG PO TABS: 5.0000 mg | ORAL_TABLET | Freq: Once | ORAL | Status: DC | PRN

## 2012-05-02 MED ORDER — NEOSTIGMINE METHYLSULFATE 1 MG/ML IJ SOLN
INTRAMUSCULAR | Status: DC | PRN
  Administered 2012-05-02: 3 mg via INTRAVENOUS

## 2012-05-02 MED ORDER — CEFAZOLIN SODIUM-DEXTROSE 2-3 GM-% IV SOLR
2.0000 g | Freq: Four times a day (QID) | INTRAVENOUS | Status: AC
  Administered 2012-05-03 (×2): 2 g via INTRAVENOUS
  Filled 2012-05-02 (×2): qty 50

## 2012-05-02 MED ORDER — SORBITOL 70 % SOLN
30.0000 mL | Freq: Every day | Status: DC | PRN
  Filled 2012-05-02: qty 30

## 2012-05-02 MED ORDER — WARFARIN SODIUM 5 MG PO TABS
5.0000 mg | ORAL_TABLET | Freq: Once | ORAL | Status: AC
  Administered 2012-05-02: 5 mg via ORAL
  Filled 2012-05-02: qty 1

## 2012-05-02 MED ORDER — HYDROCODONE-ACETAMINOPHEN 10-325 MG PO TABS
1.0000 | ORAL_TABLET | Freq: Four times a day (QID) | ORAL | Status: DC | PRN
Start: 2012-05-02 — End: 2012-05-05

## 2012-05-02 MED ORDER — PROPOFOL 10 MG/ML IV BOLUS
INTRAVENOUS | Status: DC | PRN
  Administered 2012-05-02: 50 mg via INTRAVENOUS
  Administered 2012-05-02: 100 mg via INTRAVENOUS

## 2012-05-02 MED ORDER — ONDANSETRON HCL 4 MG/2ML IJ SOLN: 4.0000 mg | Freq: Once | INTRAMUSCULAR | Status: DC

## 2012-05-02 MED ORDER — MORPHINE SULFATE 2 MG/ML IJ SOLN
0.5000 mg | INTRAMUSCULAR | Status: DC | PRN
  Administered 2012-05-03: 0.5 mg via INTRAVENOUS
  Filled 2012-05-02: qty 1

## 2012-05-02 MED ORDER — SODIUM CHLORIDE 0.9 % IV SOLN
INTRAVENOUS | Status: DC
  Administered 2012-05-02: 12:00:00 via INTRAVENOUS

## 2012-05-02 MED ORDER — ZOLPIDEM TARTRATE 5 MG PO TABS: 5.0000 mg | ORAL_TABLET | Freq: Every evening | ORAL | Status: DC | PRN

## 2012-05-02 MED ORDER — WARFARIN - PHARMACIST DOSING INPATIENT: Freq: Every day | Status: DC

## 2012-05-02 MED ORDER — GLYCOPYRROLATE 0.2 MG/ML IJ SOLN
INTRAMUSCULAR | Status: DC | PRN
  Administered 2012-05-02: 0.4 mg via INTRAVENOUS

## 2012-05-02 MED ORDER — METHOCARBAMOL 100 MG/ML IJ SOLN
500.0000 mg | Freq: Four times a day (QID) | INTRAVENOUS | Status: DC | PRN
  Filled 2012-05-02: qty 5

## 2012-05-02 MED ORDER — METHOCARBAMOL 500 MG PO TABS: 500.0000 mg | ORAL_TABLET | Freq: Four times a day (QID) | ORAL | Status: DC | PRN

## 2012-05-02 MED ORDER — HYDROMORPHONE HCL PF 1 MG/ML IJ SOLN
1.0000 mg | Freq: Once | INTRAMUSCULAR | Status: AC
  Administered 2012-05-02: 1 mg via INTRAVENOUS
  Filled 2012-05-02: qty 1

## 2012-05-02 MED ORDER — MENTHOL 3 MG MT LOZG: 1.0000 | LOZENGE | OROMUCOSAL | Status: DC | PRN

## 2012-05-02 MED ORDER — ENOXAPARIN SODIUM 40 MG/0.4ML ~~LOC~~ SOLN
40.0000 mg | SUBCUTANEOUS | Status: DC
  Filled 2012-05-02: qty 0.4

## 2012-05-02 MED ORDER — 0.9 % SODIUM CHLORIDE (POUR BTL) OPTIME
TOPICAL | Status: DC | PRN
  Administered 2012-05-02: 1000 mL

## 2012-05-02 MED ORDER — LISINOPRIL 10 MG PO TABS
10.0000 mg | ORAL_TABLET | Freq: Every day | ORAL | Status: DC
  Administered 2012-05-03 – 2012-05-05 (×2): 10 mg via ORAL
  Filled 2012-05-02 (×5): qty 1

## 2012-05-02 MED ORDER — POTASSIUM CHLORIDE IN NACL 20-0.45 MEQ/L-% IV SOLN
INTRAVENOUS | Status: DC
  Administered 2012-05-02: 23:00:00 via INTRAVENOUS
  Filled 2012-05-02 (×3): qty 1000

## 2012-05-02 MED ORDER — METHOCARBAMOL 100 MG/ML IJ SOLN
500.0000 mg | Freq: Four times a day (QID) | INTRAVENOUS | Status: DC | PRN
  Administered 2012-05-03: 500 mg via INTRAVENOUS
  Filled 2012-05-02 (×2): qty 5

## 2012-05-02 MED ORDER — HYDROMORPHONE HCL PF 1 MG/ML IJ SOLN
1.0000 mg | INTRAMUSCULAR | Status: DC | PRN
  Administered 2012-05-02: 1 mg via INTRAVENOUS
  Filled 2012-05-02: qty 1

## 2012-05-02 MED ORDER — MORPHINE SULFATE 4 MG/ML IJ SOLN
0.5000 mg | INTRAMUSCULAR | Status: DC | PRN
  Administered 2012-05-02: 0.52 mg via INTRAVENOUS
  Filled 2012-05-02: qty 1

## 2012-05-02 MED ORDER — ONDANSETRON HCL 4 MG/2ML IJ SOLN
4.0000 mg | Freq: Four times a day (QID) | INTRAMUSCULAR | Status: DC | PRN
  Administered 2012-05-03 (×2): 4 mg via INTRAVENOUS
  Filled 2012-05-02 (×2): qty 2

## 2012-05-02 MED ORDER — ACETAMINOPHEN 325 MG PO TABS: 650.0000 mg | ORAL_TABLET | Freq: Four times a day (QID) | ORAL | Status: DC | PRN

## 2012-05-02 MED ORDER — HYDROCODONE-ACETAMINOPHEN 5-325 MG PO TABS: 1.0000 | ORAL_TABLET | Freq: Four times a day (QID) | ORAL | Status: DC | PRN

## 2012-05-02 MED ORDER — MIDAZOLAM HCL 5 MG/5ML IJ SOLN
INTRAMUSCULAR | Status: DC | PRN
  Administered 2012-05-02: 1 mg via INTRAVENOUS

## 2012-05-02 MED ORDER — METHOCARBAMOL 500 MG PO TABS
500.0000 mg | ORAL_TABLET | Freq: Four times a day (QID) | ORAL | Status: DC | PRN
  Administered 2012-05-04 – 2012-05-06 (×3): 500 mg via ORAL
  Filled 2012-05-02 (×3): qty 1

## 2012-05-02 MED ORDER — METOCLOPRAMIDE HCL 5 MG/ML IJ SOLN: 5.0000 mg | Freq: Three times a day (TID) | INTRAMUSCULAR | Status: DC | PRN

## 2012-05-02 MED ORDER — ONDANSETRON HCL 4 MG/2ML IJ SOLN
4.0000 mg | Freq: Once | INTRAMUSCULAR | Status: AC
  Administered 2012-05-02: 4 mg via INTRAVENOUS
  Filled 2012-05-02: qty 2

## 2012-05-02 MED ORDER — ONDANSETRON HCL 4 MG PO TABS: 4.0000 mg | ORAL_TABLET | Freq: Four times a day (QID) | ORAL | Status: DC | PRN

## 2012-05-02 MED ORDER — ACETAMINOPHEN 10 MG/ML IV SOLN
1000.0000 mg | Freq: Four times a day (QID) | INTRAVENOUS | Status: AC
  Administered 2012-05-02 – 2012-05-03 (×4): 1000 mg via INTRAVENOUS
  Filled 2012-05-02 (×4): qty 100

## 2012-05-02 MED ORDER — ALBUMIN HUMAN 5 % IV SOLN
INTRAVENOUS | Status: DC | PRN
  Administered 2012-05-02 (×2): via INTRAVENOUS

## 2012-05-02 MED ORDER — DOCUSATE SODIUM 100 MG PO CAPS
100.0000 mg | ORAL_CAPSULE | Freq: Two times a day (BID) | ORAL | Status: DC
  Administered 2012-05-03 – 2012-05-06 (×6): 100 mg via ORAL
  Filled 2012-05-02 (×8): qty 1

## 2012-05-02 SURGICAL SUPPLY — 46 items
2.5MM REAMING ROD WITH BALL ×1 IMPLANT
APL SKNCLS STERI-STRIP NONHPOA (GAUZE/BANDAGES/DRESSINGS) ×1
BENZOIN TINCTURE PRP APPL 2/3 (GAUZE/BANDAGES/DRESSINGS) ×2 IMPLANT
BIT DRILL 4.0X195MM (BIT) IMPLANT
BLADE HELICAL 90 (Orthopedic Implant) ×1 IMPLANT
BLADE TI HELICAL 11.0 95 (Orthopedic Implant) ×1 IMPLANT
BOOTCOVER CLEANROOM LRG (PROTECTIVE WEAR) ×2 IMPLANT
CLOTH BEACON ORANGE TIMEOUT ST (SAFETY) ×2 IMPLANT
COVER SURGICAL LIGHT HANDLE (MISCELLANEOUS) ×2 IMPLANT
DRAPE INCISE IOBAN 66X45 STRL (DRAPES) ×1 IMPLANT
DRAPE STERI IOBAN 125X83 (DRAPES) ×2 IMPLANT
DRILL BIT 4.0X195MM (BIT) ×1
DRSG MEPILEX BORDER 4X4 (GAUZE/BANDAGES/DRESSINGS) ×2 IMPLANT
DRSG MEPILEX BORDER 4X8 (GAUZE/BANDAGES/DRESSINGS) ×4 IMPLANT
DURAPREP 26ML APPLICATOR (WOUND CARE) ×2 IMPLANT
ELECT CAUTERY BLADE 6.4 (BLADE) ×2 IMPLANT
ELECT REM PT RETURN 9FT ADLT (ELECTROSURGICAL) ×2
ELECTRODE REM PT RTRN 9FT ADLT (ELECTROSURGICAL) ×1 IMPLANT
EVACUATOR 1/8 PVC DRAIN (DRAIN) IMPLANT
FACESHIELD LNG OPTICON STERILE (SAFETY) ×2 IMPLANT
GAUZE XEROFORM 5X9 LF (GAUZE/BANDAGES/DRESSINGS) ×2 IMPLANT
GLOVE BIOGEL PI IND STRL 8 (GLOVE) ×1 IMPLANT
GLOVE BIOGEL PI INDICATOR 8 (GLOVE) ×1
GLOVE ORTHO TXT STRL SZ7.5 (GLOVE) ×4 IMPLANT
GLOVE SURG ORTHO 8.0 STRL STRW (GLOVE) ×4 IMPLANT
GOWN PREVENTION PLUS XLARGE (GOWN DISPOSABLE) ×1 IMPLANT
GOWN STRL NON-REIN LRG LVL3 (GOWN DISPOSABLE) IMPLANT
GUIDEWIRE 3.2X400 (WIRE) ×3 IMPLANT
KIT ROOM TURNOVER OR (KITS) ×2 IMPLANT
MANIFOLD NEPTUNE II (INSTRUMENTS) ×2 IMPLANT
NAIL TROCH TI 11X130X320 (Nail) ×1 IMPLANT
NS IRRIG 1000ML POUR BTL (IV SOLUTION) ×2 IMPLANT
PACK GENERAL/GYN (CUSTOM PROCEDURE TRAY) ×2 IMPLANT
PAD ARMBOARD 7.5X6 YLW CONV (MISCELLANEOUS) ×4 IMPLANT
REAMER ROD DEEP FLUTE 2.5X950 (INSTRUMENTS) ×1 IMPLANT
SCREW LOCK TI 5.0X36 F/IM NAIL (Screw) ×1 IMPLANT
STAPLER VISISTAT 35W (STAPLE) ×2 IMPLANT
STRIP CLOSURE SKIN 1/2X4 (GAUZE/BANDAGES/DRESSINGS) ×2 IMPLANT
SUT VIC AB 0 CTB1 27 (SUTURE) ×2 IMPLANT
SUT VIC AB 2-0 FS1 27 (SUTURE) ×2 IMPLANT
SUT VIC AB 2-0 SH 27 (SUTURE)
SUT VIC AB 2-0 SH 27XBRD (SUTURE) IMPLANT
SUT VIC AB 3-0 SH 8-18 (SUTURE) ×2 IMPLANT
TOWEL OR 17X24 6PK STRL BLUE (TOWEL DISPOSABLE) ×2 IMPLANT
TOWEL OR 17X26 10 PK STRL BLUE (TOWEL DISPOSABLE) ×2 IMPLANT
WATER STERILE IRR 1000ML POUR (IV SOLUTION) ×2 IMPLANT

## 2012-05-02 NOTE — Progress Notes (Signed)
Orthopedic Tech Progress Note Patient Details:  [name] May 13, 1956 [id number] OHF with trapeze bar applied to patient bed Patient ID: [name], female   DOB: April 21, 1956, 56 y.o.   MRN: [id number]   Orie Rout 05/02/2012, 4:11 PM

## 2012-05-02 NOTE — ED Provider Notes (Signed)
History     CSN: [id number]  Arrival date & time 05/02/12  9528   First MD Initiated Contact with Patient 05/02/12 0703      Chief Complaint  Patient presents with  . Fall    (Consider location/radiation/quality/duration/timing/severity/associated sxs/prior treatment) HPI.... accidental fall backwards while pulling on the cart  in dietary services at [facility].  No head or neck trauma. c/o of pain in left proximal lateral femur.  Palpation and movement makes pain worse.  Severity is moderate to severe. No other obvious injuries.  Past Medical History  Diagnosis Date  . Hypertension     off meds after wt loss  . Ulcerative colitis     hx  . Osteopenia   . GERD (gastroesophageal reflux disease)     hx-contrlled now  . Blood in stool   . Colon polyp   . IBD (inflammatory bowel disease)   . Hemorrhoids   . Diverticulosis   . Vitamin B12 deficiency     Past Surgical History  Procedure Date  . Hernia repair     lt ing   . Tonsillectomy   . Fracture surgery 2004    fx lt thumb  . Upper gastrointestinal endoscopy   . Colonoscopy   . Orif finger fracture 06/14/2011    Procedure: OPEN REDUCTION INTERNAL FIXATION (ORIF) METACARPAL (FINGER) FRACTURE;  Surgeon: Tami Ribas, MD;  Location: [facility];  Service: Orthopedics;  Laterality: Right;  right ring    Family History  Problem Relation Age of Onset  . Lymphoma Mother   . Lymphoma Father     History  Substance Use Topics  . Smoking status: Never Smoker   . Smokeless tobacco: Never Used  . Alcohol Use: Yes     Comment: rare    OB History    Grav Para Term Preterm Abortions TAB SAB Ect Mult Living                  Review of Systems  All other systems reviewed and are negative.    Allergies  Review of patient's allergies indicates no known allergies.  Home Medications   Current Outpatient Rx  Name  Route  Sig  Dispense  Refill  . LISINOPRIL 10 MG PO TABS   Oral   Take 1 tablet (10  mg total) by mouth daily.   90 tablet   3   . ONE-DAILY MULTI VITAMINS PO TABS   Oral   Take 1 tablet by mouth daily.         Marland Kitchen RISEDRONATE SODIUM 150 MG PO TABS   Oral   Take 150 mg by mouth every 30 (thirty) days. with water on empty stomach, nothing by mouth or lie down for next 30 minutes.         Marland Kitchen VITAMIN D (CHOLECALCIFEROL) PO   Oral   Take by mouth.           BP 147/93  Pulse 84  Temp 98 F (36.7 C) (Oral)  Resp 18  SpO2 100%  Physical Exam  Nursing note and vitals reviewed. Constitutional: She is oriented to person, place, and time. She appears well-developed and well-nourished.  HENT:  Head: Normocephalic and atraumatic.  Eyes: Conjunctivae normal and EOM are normal. Pupils are equal, round, and reactive to light.  Neck: Normal range of motion. Neck supple.  Cardiovascular: Normal rate, regular rhythm and normal heart sounds.   Pulmonary/Chest: Effort normal and breath sounds normal.  Abdominal: Soft.  Bowel sounds are normal.  Musculoskeletal:       Tender proximal lateral left femur. Pain with range of motion  Neurological: She is alert and oriented to person, place, and time.  Skin: Skin is warm and dry.  Psychiatric: She has a normal mood and affect.    ED Course  Procedures (including critical care time)  Labs Reviewed  CBC WITH DIFFERENTIAL - Abnormal; Notable for the following:    Hemoglobin 15.8 (*)     HCT 46.3 (*)     All other components within normal limits  BASIC METABOLIC PANEL - Abnormal; Notable for the following:    Glucose, Bld 104 (*)     All other components within normal limits  TYPE AND SCREEN  ABO/RH    Dg Chest 1 View  05/02/2012  *RADIOLOGY REPORT*  Clinical Data: Preop left hip injury  CHEST - 1 VIEW  Comparison: None.  Findings: Increased interstitial markings, likely chronic.  No focal consolidation. No pleural effusion or pneumothorax.  The heart is top normal in size.  Prominence of the right paratracheal stripe,  possibly vascular.  IMPRESSION: No evidence of acute cardiopulmonary disease.  Prominence of the right paratracheal study, possibly vascular. Correlate with prior imaging, if available.  Otherwise, consider nonemergent CT with contrast to exclude underlying adenopathy.   Original Report Authenticated By: Charline Bills, M.D.    Dg Femur Left  05/02/2012  *RADIOLOGY REPORT*  Clinical Data: Fall, left hip pain  LEFT FEMUR - 2 VIEW  Comparison: None.  Findings: Comminuted intertrochanteric left hip fracture with at least three dominant fracture fragments.  Greater trochanteric fragment is mildly displaced.  Secondary varus angulation.  IMPRESSION: Comminuted intertrochanteric left hip fracture, as described above.   Original Report Authenticated By: Charline Bills, M.D.      Date: 05/02/2012  Rate: 116  Rhythm: sinus tachycardia  QRS Axis: normal  Intervals: normal  ST/T Wave abnormalities: normal  Conduction Disutrbances:none  Narrative Interpretation:   Old EKG Reviewed: none available  No results found.   No diagnosis found.    MDM   X-ray of left femur reveals a comminuted intertrochanteric left hip fracture.  Discussed with orthopedics and general medicine. Admit for surgery       Donnetta Hutching, MD 05/02/12 [phone]

## 2012-05-02 NOTE — ED Notes (Signed)
Dr Dion Saucier spoke with pt on phone

## 2012-05-02 NOTE — ED Notes (Signed)
Pt states she was pushing a cart on the dock. The handle on the cart broke off and patient states she fell backwards onto her left hip. Pt states pain 10/10. Pt states she did not hit her head or no LOC. No bruising noted on patient left hip. Pt alert and oriented. No signs of distress noted. Pt supervisor and security at bedside.

## 2012-05-02 NOTE — Progress Notes (Signed)
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 Employee states she fell backwards on her left hip, did not hit her head or lost consciousness. Employee requests transport to ED. Employee taken to ED, her direct supervisor aware and is at bedside.

## 2012-05-02 NOTE — Progress Notes (Addendum)
Patient refusing foley to be placed at this time.  Will speak to MD about it prior to surgery.

## 2012-05-02 NOTE — ED Notes (Signed)
Resting, daughter at bedside, states does not need any more pain meds unless moving.

## 2012-05-02 NOTE — H&P (Signed)
ORTHOPAEDIC CONSULTATION  REQUESTING PHYSICIAN: Rhetta Mura, MD  Chief Complaint: Left hip pain  HPI: [name] is a 56 y.o. female who complains of  left hip pain after a fall. She was at work, moving a cart on a loading dock, when the handle on the cart broke, and she fell, landing on her left side. She had the acute onset severe pain, was brought to the emergency room, and found to have a left hip fracture. She works here at [employer] in [employer]. She had no other injuries in the fall. No loss of consciousness.  Past Medical History  Diagnosis Date  . Hypertension     off meds after wt loss  . Ulcerative colitis     hx  . Osteopenia   . GERD (gastroesophageal reflux disease)     hx-contrlled now  . Blood in stool   . Colon polyp   . IBD (inflammatory bowel disease)   . Hemorrhoids   . Diverticulosis   . Vitamin B12 deficiency   . Turner's syndrome     was on Provera and Premarin and d/c this  at age 17yr   Past Surgical History  Procedure Date  . Hernia repair     lt ing   . Tonsillectomy   . Fracture surgery 2004    fx lt thumb  . Upper gastrointestinal endoscopy   . Colonoscopy   . Orif finger fracture 06/14/2011    Procedure: OPEN REDUCTION INTERNAL FIXATION (ORIF) METACARPAL (FINGER) FRACTURE;  Surgeon: Tami Ribas, MD;  Location: [facility];  Service: Orthopedics;  Laterality: Right;  right ring   History   Social History  . Marital Status: Married    Spouse Name: N/A    Number of Children: N/A  . Years of Education: N/A   Social History Main Topics  . Smoking status: Never Smoker   . Smokeless tobacco: Never Used  . Alcohol Use: Yes     Comment: rare  . Drug Use: No  . Sexually Active: None   Other Topics Concern  . 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 Lymphoma Mother     nhl-mantle cell  . Lymphoma Father     nhl   No Known Allergies Prior to Admission medications   Medication Sig Start Date End Date Taking? Authorizing Provider  lisinopril (PRINIVIL,ZESTRIL) 10 MG tablet Take 1 tablet (10 mg total) by mouth daily. 04/09/12  Yes Terressa Koyanagi, DO  Multiple Vitamin (MULTIVITAMIN) tablet Take 1 tablet by mouth daily.   Yes Historical Provider, MD  risedronate (ACTONEL) 150 MG tablet Take 150 mg by mouth every 30 (thirty) days. with water on empty stomach, nothing by mouth or lie down for next 30 minutes.   Yes Historical Provider, MD  VITAMIN D, CHOLECALCIFEROL, PO Take by mouth.   Yes Historical Provider, MD   Dg Chest 1 View  05/02/2012  *RADIOLOGY REPORT*  Clinical Data: Preop left hip injury  CHEST - 1 VIEW  Comparison: None.  Findings: Increased interstitial markings, likely chronic.  No focal consolidation. No pleural effusion or pneumothorax.  The heart is top normal in size.  Prominence of the right paratracheal stripe, possibly vascular.  IMPRESSION: No evidence of acute cardiopulmonary  disease.  Prominence of the right paratracheal study, possibly vascular. Correlate with prior imaging, if available.  Otherwise, consider nonemergent CT with contrast to exclude underlying adenopathy.   Original Report Authenticated By: Charline Bills, M.D.    Dg Femur Left  05/02/2012  *RADIOLOGY REPORT*  Clinical Data: Fall, left hip pain  LEFT FEMUR - 2 VIEW  Comparison: None.  Findings: Comminuted intertrochanteric left hip fracture with at least three dominant fracture fragments.  Greater trochanteric fragment is mildly displaced.  Secondary varus angulation.  IMPRESSION: Comminuted intertrochanteric left hip fracture, as described above.   Original Report Authenticated By: Charline Bills, M.D.    CBC    Component Value Date/Time   WBC 7.7 05/02/2012 0726   RBC 5.11 05/02/2012 0726   HGB 15.8* 05/02/2012  0726   HCT 46.3* 05/02/2012 0726   PLT 216 05/02/2012 0726   MCV 90.6 05/02/2012 0726   MCH 30.9 05/02/2012 0726   MCHC 34.1 05/02/2012 0726   RDW 12.8 05/02/2012 0726   LYMPHSABS 2.3 05/02/2012 0726   MONOABS 0.5 05/02/2012 0726   EOSABS 0.2 05/02/2012 0726   BASOSABS 0.0 05/02/2012 0726    Positive ROS: All other systems have been reviewed and were otherwise negative with the exception of those mentioned in the HPI and as above.  Physical Exam: General: Alert, no acute distress Cardiovascular: No pedal edema Respiratory: No cyanosis, no use of accessory musculature GI: No organomegaly, abdomen is soft and non-tender Skin: No lesions in the area of chief complaint Neurologic: Sensation intact distally Psychiatric: Patient is competent for consent with normal mood and affect Lymphatic: No axillary or cervical lymphadenopathy  MUSCULOSKELETAL: Left leg is shortened, externally rotated, EHL and FHL are firing. She has pain to palpation over the left hip.  Assessment: Left complex intertrochanteric/subtrochanteric hip fracture  Plan: She has multiple risk factors when considering surgery, including her inflammatory bowel disease and ulcerative colitis, however she is active, and wants to maintain her ambulatory function. Therefore I recommended surgical intervention with intramedullary nail fixation.  The risks benefits and alternatives were discussed with the patient including but not limited to the risks of nonoperative treatment, versus surgical intervention including infection, bleeding, nerve injury, malunion, nonunion, the need for revision surgery, hardware prominence, hardware failure, the need for hardware removal, blood clots, cardiopulmonary complications, morbidity, mortality, among others, and they were willing to proceed.  We've also discussed the risks for leg length discrepancy, the need for conversion to arthroplasty, among others. She is willing to  proceed.  Darion Juhasz P, MD Cell [phone] Pager [phone]  05/02/2012 5:07 PM

## 2012-05-02 NOTE — Preoperative (Signed)
Beta Blockers   Reason not to administer Beta Blockers:Not Applicable 

## 2012-05-02 NOTE — Transfer of Care (Signed)
Immediate Anesthesia Transfer of Care Note  Patient: [name]  Procedure(s) Performed: Procedure(s) (LRB) with comments: INTRAMEDULLARY (IM) NAIL INTERTROCHANTRIC (Left)  Patient Location: PACU  Anesthesia Type:General  Level of Consciousness: awake, alert  and patient cooperative  Airway & Oxygen Therapy: Patient Spontanous Breathing and Patient connected to nasal cannula oxygen  Post-op Assessment: Report given to PACU RN and Post -op Vital signs reviewed and stable  Post vital signs: Reviewed and stable  Complications: No apparent anesthesia complications

## 2012-05-02 NOTE — Anesthesia Postprocedure Evaluation (Signed)
  Anesthesia Post-op Note  Patient: [name]  Procedure(s) Performed: Procedure(s) (LRB) with comments: INTRAMEDULLARY (IM) NAIL INTERTROCHANTRIC (Left)  Patient Location: PACU  Anesthesia Type:General  Level of Consciousness: awake  Airway and Oxygen Therapy: Patient Spontanous Breathing  Post-op Pain: mild  Post-op Assessment: Post-op Vital signs reviewed  Post-op Vital Signs: Reviewed  Complications: No apparent anesthesia complications 

## 2012-05-02 NOTE — Anesthesia Postprocedure Evaluation (Signed)
  Anesthesia Post-op Note  Patient: [name]  Procedure(s) Performed: Procedure(s) (LRB) with comments: INTRAMEDULLARY (IM) NAIL INTERTROCHANTRIC (Left)  Patient Location: PACU  Anesthesia Type:General  Level of Consciousness: awake  Airway and Oxygen Therapy: Patient Spontanous Breathing  Post-op Pain: mild  Post-op Assessment: Post-op Vital signs reviewed  Post-op Vital Signs: Reviewed  Complications: No apparent anesthesia complications

## 2012-05-02 NOTE — H&P (Signed)
[facility] History and Physical  [name] UXL:[id number] DOB: 26-Apr-1956 DOA: 05/02/2012  Referring physician: Adriana Simas, EDP PCP: Terressa Koyanagi., DO  Specialists: Dion Saucier, Orthopedics has been contacted by Ed-will see in consult  Chief Complaint: Mechanical fall  HPI: [name] is a 56 y.o. 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 She states that she was unable to push this as this was over 2-300 pounds and went around it the other site to pull it and then lost hold of the palate and fell backwards onto her left hip and felt immediate pain. She did not have any syncopal episodes any dizziness any blurred vision or other alarm symptoms prior to this. This seems to been just a simple mechanical fall. She does relate to me however that she has been osteoporotic probably for the past 10-15 years and has this monitored regularly by her primary care physician-she mentions in passing that she was diagnosed as a child with Turner syndrome and was on Provera and Premarin as a younger lady. This was discontinued about 12 years ago.  Review of Systems: The patient denies headache, n/v,cp, no vblurred or double vision, sob, , no sore throat,   Past Medical History  Diagnosis Date  . Hypertension     off meds after wt loss  . Ulcerative colitis     hx  . Osteopenia   . GERD (gastroesophageal reflux disease)     hx-contrlled now  . Blood in stool   . Colon polyp   . IBD (inflammatory bowel disease)   . Hemorrhoids   . Diverticulosis   . Vitamin B12 deficiency    Chart review  Noted h/o Htn/Hlf and segmental colitis  Apparently due for Dexa in Jan along with Mammogram  Mammogram done in November was normal patient report  Colonoscopy 10.10.03=1. Internal-external small hemorrhoids. 2. One tiny midsigmoid polyp, hot biopsied. 3. Two small ileocecal valve polyps, both snared, and both bases  hotbiopsied. 4. Two tiny cecal and ascending polyps, hot biopsied. 5. Otherwise within normal limits to the cecum.  Open # Proximal phalnx +osteo 01/06/03  Crohn's noted on colonoscopy???? 01/10/09  Past Surgical History  Procedure Date  . Hernia repair     lt ing   . Tonsillectomy   . Fracture surgery 2004    fx lt thumb  . Upper gastrointestinal endoscopy   . Colonoscopy   . Orif finger fracture 06/14/2011    Procedure: OPEN REDUCTION INTERNAL FIXATION (ORIF) METACARPAL (FINGER) FRACTURE;  Surgeon: Tami Ribas, MD;  Location: [facility];  Service: Orthopedics;  Laterality: Right;  right ring   Social History:  reports that she has never smoked. She has never used smokeless tobacco. She reports that she drinks alcohol. She reports that she does not use illicit drugs.  History   Social History  . Marital Status: Married    Spouse Name: N/A    Number of Children: N/A  . Years of Education: N/A   Social History Main Topics  . Smoking status: Never Smoker   . Smokeless tobacco: Never Used  . Alcohol Use: Yes     Comment: rare  . Drug Use: No  . Sexually Active: None   Other Topics Concern  . 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 Lymphoma Mother     nhl-mantle cell  . Lymphoma Father     nhl     Prior to Admission medications   Medication Sig Start Date End Date Taking? Authorizing Provider  lisinopril (PRINIVIL,ZESTRIL) 10 MG tablet Take 1 tablet (10 mg total) by mouth daily. 04/09/12  Yes Terressa Koyanagi, DO  Multiple Vitamin (MULTIVITAMIN) tablet Take 1 tablet by mouth daily.   Yes Historical Provider, MD  risedronate (ACTONEL) 150 MG tablet Take 150 mg by mouth every 30 (thirty) days. with water on empty stomach, nothing by mouth or lie down for next  30 minutes.   Yes Historical Provider, MD  VITAMIN D, CHOLECALCIFEROL, PO Take by mouth.   Yes Historical Provider, MD   Physical Exam: Filed Vitals:   05/02/12 0653  BP: 147/93  Pulse: 84  Temp: 98 F (36.7 C)  TempSrc: Oral  Resp: 18  SpO2: 100%     General:  Alert pleasant oriented Caucasian female looking older than stated age  Eyes: No pallor no icterus  ENT: No JVD some submandibular lymphadenopathy on the right side-her right tonsil seems enlarged but there is no redness or erythema  Neck: C. above  Cardiovascular: S1-S2 tachycardic to the 120s-seems to be sinus tach  Respiratory: Clinically clear no added  Abdomen: Soft nontender nondistended no rebound or guarding bowel sounds normal  Skin: Rash over her left foot purpuric which is chronic  Musculoskeletal: Patient has deformity of the left thigh and pain in the left side more so than pain in the hip. Her left leg is propped on a pillow.  Psychiatric: Euthymic but anxious  Neurologic: Grossly intact moves all 4 extreme TCP-dorsalis pedis on the left and right side is bounding  Labs on Admission:  Basic Metabolic Panel:  Lab 05/02/12 3557  NA 141  K 3.8  CL 100  CO2 27  GLUCOSE 104*  BUN 15  CREATININE 0.61  CALCIUM 9.6  MG --  PHOS --   Liver Function Tests: No results found for this basename: AST:5,ALT:5,ALKPHOS:5,BILITOT:5,PROT:5,ALBUMIN:5 in the last 168 hours No results found for this basename: LIPASE:5,AMYLASE:5 in the last 168 hours No results found for this basename: AMMONIA:5 in the last 168 hours CBC:  Lab 05/02/12 0726  WBC 7.7  NEUTROABS 4.8  HGB 15.8*  HCT 46.3*  MCV 90.6  PLT 216   Cardiac Enzymes: No results found for this basename: CKTOTAL:5,CKMB:5,CKMBINDEX:5,TROPONINI:5 in the last 168 hours  BNP (last 3 results) No results found for this basename: PROBNP:3 in the last 8760 hours CBG: No results found for this basename: GLUCAP:5 in the last 168 hours  Radiological  Exams on Admission: Dg Chest 1 View  05/02/2012  *RADIOLOGY REPORT*  Clinical Data: Preop left hip injury  CHEST - 1 VIEW  Comparison: None.  Findings: Increased interstitial markings, likely chronic.  No focal consolidation. No pleural effusion or pneumothorax.  The heart is top normal in size.  Prominence of the right paratracheal stripe, possibly vascular.  IMPRESSION: No evidence of acute cardiopulmonary disease.  Prominence of the right paratracheal study, possibly vascular. Correlate with prior imaging, if available.  Otherwise, consider nonemergent CT with contrast to exclude underlying adenopathy.   Original Report Authenticated By: Charline Bills, M.D.    Dg Femur Left  05/02/2012  *RADIOLOGY REPORT*  Clinical Data: Fall, left hip pain  LEFT FEMUR - 2 VIEW  Comparison: None.  Findings: Comminuted intertrochanteric left hip fracture with at least three dominant fracture fragments.  Greater trochanteric fragment is mildly displaced.  Secondary varus angulation.  IMPRESSION: Comminuted intertrochanteric left hip fracture, as described above.   Original Report Authenticated By: Charline Bills, M.D.     EKG: Independently reviewed. Sinus tachycardia, PR interval 0.08, QRS axis about 10. There is some rate related J-point depression across precordial leads but no overt ST-T wave depressions.  Assessment/Plan Principal Problem:  *Hip fracture, intertrochanteric Active Problems:  B12 DEFICIENCY  HYPERTENSION  HEMORRHOIDS  INFLAMMATORY BOWEL DISEASE  COLONIC POLYPS, HYPERPLASTIC, HX OF  H/O Turner syndrome   1. Left intertrochanteric hip fracture-Dr. Dion Saucier of orthopedics is aware of patient and actually spoke to the patient on telephone while I was in the room and plans to patient for surgery at 1500 hours 05/02/2012. From a medical standpoint this patient is clear for surgery. I suspect she will do well with the same. DVT prophylaxis, weight bearing precautions as per orthopedist-IV  fluids 125 cc per hour until surgery, patient has been typed and screened 2. Osteoporosis-premature-proabably related to #3-patient has a history of Turner syndrome-she was on hormone replacement with both Provera and Premarin until 2002. There is some evidence that estrogen does have bone remodeling and bone building properties. The risks and benefits of this need to be weighed carefully with her gynecologist and primary care physician. For now we will get a vitamin D. level 1-25 oh-in the outpatient setting she was on vitamin D 400 mg and Actonel. She potentially will need 50,000 units q. weekly given she had a traumatic fracture [I would not consider this an osteoporotic fracture]-as an outpatient I would recommend endocrinology followup for the same. 3. H/o hypertension-blood pressures uncontrolled in the 150s to 140s over an 80-90-probably stress-related/anxiety related. Patient should be medicated with her regular home meds prior to surgery. 4. History of potential Crohn's disease-outpatient followup. Patient asymptomatic at this time 5. History B12 deficiency-get interval followup. Her hemoglobint is actually 15 today. 6. History of Turner syndrome-see above-unclear genotype. May require genetic studies. May also require endocrinology followup as an outpatient 7. Anxiety-situational-will prescribe Xanax when necessary.    Code Status: Patient requests full CODE STATUS.  Family Communication: Full discussion with patient's daughter at bedside who understands plan of care Disposition Plan: 3-4 days, potential home with home health, PT OT to assess patient subsequent surgery Time spent: 55 minutes  [name] [facility] Pager [phone]  If 7PM-7AM, please contact night-coverage [email or web address] Password [email or web address] 05/02/2012, 10:00 AM

## 2012-05-02 NOTE — Op Note (Signed)
DATE OF SURGERY:  05/02/2012  TIME: 7:11 PM  PATIENT NAME:  [name]  AGE: 56 y.o.  PRE-OPERATIVE DIAGNOSIS:  Left intertrochanteric hip fracture  POST-OPERATIVE DIAGNOSIS:  SAME  PROCEDURE:  INTRAMEDULLARY (IM) NAIL INTERTROCHANTRIC  SURGEON:  Willia Lampert P  ASSISTANT:  Janace Litten, OPA-C, present and scrubbed throughout the case, critical for assistance with exposure, retraction, instrumentation, and closure.  OPERATIVE IMPLANTS: Synthes trochanteric femoral nail with interlocking helical blade into the femoral head.  PREOPERATIVE INDICATIONS:  [name] is a 56 y.o. year old who fell while at work and suffered a hip fracture. She was brought into the ER and then admitted and optimized and then elected for surgical intervention.  She had a basicervical neck fracture with a subtrochanteric component.  The risks benefits and alternatives were discussed with the patient including but not limited to the risks of nonoperative treatment, versus surgical intervention including infection, bleeding, nerve injury, malunion, nonunion, hardware prominence, hardware failure, need for hardware removal, blood clots, cardiopulmonary complications, morbidity, mortality, among others, and they were willing to proceed.    OPERATIVE PROCEDURE:  The patient was brought to the operating room and placed in the supine position. General anesthesia was administered, with a foley. She was placed on the fracture table.  Closed reduction was performed under C-arm guidance. The length of the femur was also measured using fluoroscopy. Time out was then performed after sterile prep and drape. She received preoperative antibiotics.  Incision was made proximal to the greater trochanter. A guidewire was placed in the appropriate position. Confirmation was made on AP and lateral views. The above-named nail was opened. I opened the proximal femur with a reamer. I did have to ream the femur to a 12.5, and  placing the nail was fairly difficult.  This may have been in part due to her bow in her femur due to her abnormal anatomy   Once the nail was completely seated, I placed a guidepin into the femoral head into the center center position. It was fairly difficult to get into the center of the head, because her proximal geometry was so small, do to her [name], and getting the nail distally enough was fairly challenging. Nonetheless I was able to get it into a satisfactory position, and I measured the length, and then reamed the lateral cortex and up into the head. I then placed the helical blade. Slight compression was applied. Anatomic fixation achieved. Bone quality was reasonably good..  I then secured the proximal interlocking bolt, and took off a half a turn, and then removed the instruments. I did place a distal interlocking bolts, which had excellent bicortical bite. Leg length was recreated based on fracture pattern, and rotational alignment also based on the best appearing reduction.  I took final C-arm pictures AP and lateral the entire length of the leg. Anatomic reconstruction was achieved, and the wounds were irrigated copiously and closed with Vicryl followed by Steri-Strips and sterile gauze for the skin. The patient was awakened and returned to PACU in stable and satisfactory condition. There no complications and the patient tolerated the procedure well.  She will be weightbearing as tolerated, and will be on Lovenox bridging to Coumadin for a period of one month with a goal INR of 2-3.   Teryl Lucy, M.D.

## 2012-05-02 NOTE — Progress Notes (Signed)
ANTICOAGULATION CONSULT NOTE - Initial Consult  Pharmacy Consult for Coumadin Indication: VTE prophylaxis  No Known Allergies  Patient Measurements:   Heparin Dosing Weight:   Vital Signs: Temp: 98.1 F (36.7 C) (12/13 2100) Temp src: Oral (12/13 1634) BP: 124/65 mmHg (12/13 2045) Pulse Rate: 93  (12/13 1634)  Labs:  Basename 05/02/12 1635 05/02/12 0726  HGB -- 15.8*  HCT -- 46.3*  PLT -- 216  APTT -- --  LABPROT 12.7 --  INR 0.96 --  HEPARINUNFRC -- --  CREATININE -- 0.61  CKTOTAL -- --  CKMB -- --  TROPONINI -- --    The CrCl is unknown because both a height and weight (above a minimum accepted value) are required for this calculation.   Medical History: Past Medical History  Diagnosis Date  . Hypertension     off meds after wt loss  . Ulcerative colitis     hx  . Osteopenia   . GERD (gastroesophageal reflux disease)     hx-contrlled now  . Blood in stool   . Colon polyp   . IBD (inflammatory bowel disease)   . Hemorrhoids   . Diverticulosis   . Vitamin B12 deficiency   . Turner's syndrome     was on Provera and Premarin and d/c this  at age 83yr  . Fracture, intertrochanteric, left femur 05/02/2012    Medications:  Scheduled:    . acetaminophen  1,000 mg Intravenous Q6H  . [COMPLETED]  ceFAZolin (ANCEF) IV  3 g Intravenous 30 min Pre-Op  .  ceFAZolin (ANCEF) IV  2 g Intravenous Q6H  . coumadin book   Does not apply Once  . docusate sodium  100 mg Oral BID  . enoxaparin (LOVENOX) injection  40 mg Subcutaneous Q24H  . HYDROmorphone      . [COMPLETED]  HYDROmorphone (DILAUDID) injection  1 mg Intravenous Once  . lisinopril  10 mg Oral Daily  . [COMPLETED] ondansetron  4 mg Intravenous Once  . senna  1 tablet Oral BID  . [COMPLETED] sodium chloride  500 mL Intravenous Once  . warfarin  5 mg Oral Once  . warfarin   Does not apply Once  . Warfarin - Pharmacist Dosing Inpatient   Does not apply q1800  . [DISCONTINUED] enoxaparin (LOVENOX)  injection  40 mg Subcutaneous Q24H  . [DISCONTINUED] ondansetron  4 mg Intravenous Once  . 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 She was taken to the ED and diagnosed with a fractured hip. This was surgically repaired in the OR. To be bridged on Lovenox and start on Coumadin. Goal of Therapy:  INR 2-3 Monitor platelets by anticoagulation protocol: Yes   Plan:  Coumadin 5 mg tonight Daily PT/INR Ordered coumadin booklet and video for pt.   Eugene Garnet 05/02/2012,10:45 PM

## 2012-05-02 NOTE — Anesthesia Preprocedure Evaluation (Signed)
Anesthesia Evaluation  Patient identified by MRN, date of birth, ID band Patient awake    Reviewed: Allergy & Precautions, H&P , NPO status , Patient's Chart, lab work & pertinent test results, reviewed documented beta blocker date and time   Airway Mallampati: II TM Distance: >3 FB Neck ROM: full    Dental   Pulmonary neg pulmonary ROS,  breath sounds clear to auscultation        Cardiovascular hypertension, On Medications negative cardio ROS  Rhythm:regular     Neuro/Psych negative neurological ROS  negative psych ROS   GI/Hepatic Neg liver ROS, PUD, GERD-  Medicated and Controlled,  Endo/Other  negative endocrine ROS  Renal/GU negative Renal ROS  negative genitourinary   Musculoskeletal   Abdominal   Peds  Hematology negative hematology ROS (+)   Anesthesia Other Findings See surgeon's H&P   Reproductive/Obstetrics negative OB ROS                           Anesthesia Physical Anesthesia Plan  ASA: II  Anesthesia Plan: General   Post-op Pain Management:    Induction: Intravenous  Airway Management Planned: Oral ETT  Additional Equipment:   Intra-op Plan:   Post-operative Plan: Extubation in OR  Informed Consent: I have reviewed the patients History and Physical, chart, labs and discussed the procedure including the risks, benefits and alternatives for the proposed anesthesia with the patient or authorized representative who has indicated his/her understanding and acceptance.   Dental Advisory Given  Plan Discussed with: CRNA and Surgeon  Anesthesia Plan Comments:         Anesthesia Quick Evaluation

## 2012-05-03 ENCOUNTER — Inpatient Hospital Stay (HOSPITAL_COMMUNITY): Payer: PRIVATE HEALTH INSURANCE

## 2012-05-03 DIAGNOSIS — Z87798 Personal history of other (corrected) congenital malformations: Secondary | ICD-10-CM

## 2012-05-03 DIAGNOSIS — K5730 Diverticulosis of large intestine without perforation or abscess without bleeding: Secondary | ICD-10-CM

## 2012-05-03 DIAGNOSIS — S72143A Displaced intertrochanteric fracture of unspecified femur, initial encounter for closed fracture: Principal | ICD-10-CM

## 2012-05-03 DIAGNOSIS — K219 Gastro-esophageal reflux disease without esophagitis: Secondary | ICD-10-CM

## 2012-05-03 LAB — BASIC METABOLIC PANEL
BUN: 8 mg/dL (ref 6–23)
Calcium: 8.1 mg/dL — ABNORMAL LOW (ref 8.4–10.5)
GFR calc non Af Amer: >90 mL/min (ref >90)
Glucose, Bld: 135 mg/dL — ABNORMAL HIGH (ref 70–99)
Potassium: 3.9 mEq/L (ref 3.5–5.1)

## 2012-05-03 LAB — CBC
HCT: 29.6 % — ABNORMAL LOW (ref 36.0–46.0)
Hemoglobin: 10.3 g/dL — ABNORMAL LOW (ref 12.0–15.0)
MCH: 31.7 pg (ref 26.0–34.0)
MCHC: 34.8 g/dL (ref 30.0–36.0)

## 2012-05-03 MED ORDER — IOHEXOL 300 MG/ML  SOLN
80.0000 mL | Freq: Once | INTRAMUSCULAR | Status: AC | PRN
  Administered 2012-05-03: 80 mL via INTRAVENOUS

## 2012-05-03 MED ORDER — WARFARIN SODIUM 5 MG PO TABS
5.0000 mg | ORAL_TABLET | Freq: Once | ORAL | Status: AC
  Administered 2012-05-03: 5 mg via ORAL
  Filled 2012-05-03: qty 1

## 2012-05-03 MED ORDER — HYDROMORPHONE HCL PF 1 MG/ML IJ SOLN
0.5000 mg | INTRAMUSCULAR | Status: DC | PRN
  Administered 2012-05-03 (×2): 0.5 mg via INTRAVENOUS
  Filled 2012-05-03 (×2): qty 1

## 2012-05-03 NOTE — Progress Notes (Signed)
Patient ID: [name], female   DOB: 1955/10/13, 56 y.o.   MRN: [id number]     Subjective:  Patient reports pain as mild to moderate.  She is alert and eating breakfast no pain while at rest. Has not started PT/OT yet.  Objective:   VITALS:   Filed Vitals:   05/03/12 0034 05/03/12 0216 05/03/12 0535 05/03/12 0900  BP: 127/66 126/56 104/48   Pulse: 74 69 69   Temp: 97.3 F (36.3 C) 97.8 F (36.6 C) 98 F (36.7 C)   TempSrc: Oral Oral Oral   Resp: 17 19 18    Height:    4\' 9"  (1.448 m)  Weight:    55 kg (121 lb 4.1 oz)  SpO2: 100% 99% 99%     ABD soft Sensation intact distally Dorsiflexion/Plantar flexion intact Incision: dressing C/D/I and no drainage  LABS  Results for orders placed during the hospital encounter of 05/02/12 (from the past 24 hour(s))  PROTIME-INR     Status: Normal   Collection Time   05/02/12  4:35 PM      Component Value Range   Prothrombin Time 12.7  11.6 - 15.2 seconds   INR 0.96  0.00 - 1.49  CBC     Status: Abnormal   Collection Time   05/03/12  6:15 AM      Component Value Range   WBC 10.9 (*) 4.0 - 10.5 K/uL   RBC 3.25 (*) 3.87 - 5.11 MIL/uL   Hemoglobin 10.3 (*) 12.0 - 15.0 g/dL   HCT 30.8 (*) 65.7 - 84.6 %   MCV 91.1  78.0 - 100.0 fL   MCH 31.7  26.0 - 34.0 pg   MCHC 34.8  30.0 - 36.0 g/dL   RDW 96.2  95.2 - 84.1 %   Platelets 193  150 - 400 K/uL  BASIC METABOLIC PANEL     Status: Abnormal   Collection Time   05/03/12  6:15 AM      Component Value Range   Sodium 133 (*) 135 - 145 mEq/L   Potassium 3.9  3.5 - 5.1 mEq/L   Chloride 98  96 - 112 mEq/L   CO2 26  19 - 32 mEq/L   Glucose, Bld 135 (*) 70 - 99 mg/dL   BUN 8  6 - 23 mg/dL   Creatinine, Ser 3.24  0.50 - 1.10 mg/dL   Calcium 8.1 (*) 8.4 - 10.5 mg/dL   GFR calc non Af Amer >90  >90 mL/min   GFR calc Af Amer >90  >90 mL/min  PROTIME-INR     Status: Normal   Collection Time   05/03/12  6:15 AM      Component Value Range   Prothrombin Time 14.3  11.6 - 15.2 seconds     INR 1.13  0.00 - 1.49    Dg Chest 1 View  05/02/2012  *RADIOLOGY REPORT*  Clinical Data: Preop left hip injury  CHEST - 1 VIEW  Comparison: None.  Findings: Increased interstitial markings, likely chronic.  No focal consolidation. No pleural effusion or pneumothorax.  The heart is top normal in size.  Prominence of the right paratracheal stripe, possibly vascular.  IMPRESSION: No evidence of acute cardiopulmonary disease.  Prominence of the right paratracheal study, possibly vascular. Correlate with prior imaging, if available.  Otherwise, consider nonemergent CT with contrast to exclude underlying adenopathy.   Original Report Authenticated By: Charline Bills, M.D.    Dg Femur Left  05/02/2012  *RADIOLOGY REPORT*  Clinical  Data: Left femoral intramedullary nail  DG C-ARM 61-120 MIN,LEFT FEMUR - 2 VIEW  Technique: Intraoperative fluoroscopy of the left hip and knee with AP and lateral views obtained.  Comparison:  05/02/2012 at 0850 hours  Findings: Intraoperative fluoroscopy utilized for surgical control purposes of.  AP and lateral spot fluoroscopic views of the left hip and left knee are obtained.  There has been interval reduction of previously demonstrated intertrochanteric fractures with fixation using intramedullary rod and screws.  There is a distal locking screw.  Significant improvement of the alignment and position of the fracture fragments.  No evidence of subluxation of the hip joint.  IMPRESSION: Intraoperative fluoroscopic views of the left hip and knee obtained for surgical control purposes. Interval reduction and internal fixation of left hip fractures   Original Report Authenticated By: Burman Nieves, M.D.    Dg Femur Left  05/02/2012  *RADIOLOGY REPORT*  Clinical Data: Fall, left hip pain  LEFT FEMUR - 2 VIEW  Comparison: None.  Findings: Comminuted intertrochanteric left hip fracture with at least three dominant fracture fragments.  Greater trochanteric fragment is mildly  displaced.  Secondary varus angulation.  IMPRESSION: Comminuted intertrochanteric left hip fracture, as described above.   Original Report Authenticated By: Charline Bills, M.D.    Dg Pelvis Portable  05/02/2012  *RADIOLOGY REPORT*  Clinical Data: Postop left femoral intramedullary nail.  PORTABLE PELVIS  Comparison: Earlier same date  Findings: Examination demonstrates a left femoral intramedullary nail with associated screw bridging the femoral neck into the femoral head with hardware intact.  Mild residual lucency is seen over patient's left hip fracture in the basicervical region and lesser trochanter. The lesser trochanter is slightly more superiorly positioned than normally seen.  There is anatomic alignment about the fracture site.  There are mild symmetric degenerative changes of the hips.  IMPRESSION: Fixation of patient's left hip fracture with hardware intact and near anatomic alignment about the fracture site.  The lesser trochanter is slightly more superiorly located than normally seen.  Mild symmetric degenerative changes of the hips.   Original Report Authenticated By: Elberta Fortis, M.D.    Dg Hip Portable 1 View Left  05/02/2012  *RADIOLOGY REPORT*  Clinical Data: Postop left femoral intramedullary nail.  PORTABLE LEFT HIP - 1 VIEW  Comparison: Earlier same date  Findings: Cross-table lateral image demonstrates an intramedullary nail and associated screw through the femoral neck into the femoral head fixating the patient's intertrochanteric fracture.  Hardware appears intact as there is normal alignment about the fracture site.  IMPRESSION: Cross-table lateral image demonstrating intact left femoral intramedullary nail and associated screw bridging patient's intertrochanteric fracture with hardware intact and normal alignment about the fracture site.   Original Report Authenticated By: Elberta Fortis, M.D.    Dg C-arm 61-120 Min  05/02/2012  *RADIOLOGY REPORT*  Clinical Data: Left femoral  intramedullary nail  DG C-ARM 61-120 MIN,LEFT FEMUR - 2 VIEW  Technique: Intraoperative fluoroscopy of the left hip and knee with AP and lateral views obtained.  Comparison:  05/02/2012 at 0850 hours  Findings: Intraoperative fluoroscopy utilized for surgical control purposes of.  AP and lateral spot fluoroscopic views of the left hip and left knee are obtained.  There has been interval reduction of previously demonstrated intertrochanteric fractures with fixation using intramedullary rod and screws.  There is a distal locking screw.  Significant improvement of the alignment and position of the fracture fragments.  No evidence of subluxation of the hip joint.  IMPRESSION: Intraoperative fluoroscopic views of  the left hip and knee obtained for surgical control purposes. Interval reduction and internal fixation of left hip fractures   Original Report Authenticated By: Burman Nieves, M.D.     Assessment/Plan: 1 Day Post-Op   Principal Problem:  *Fracture, intertrochanteric, left femur Active Problems:  B12 DEFICIENCY  HYPERTENSION  HEMORRHOIDS  INFLAMMATORY BOWEL DISEASE  COLONIC POLYPS, HYPERPLASTIC, HX OF  H/O Turner syndrome   Advance diet Up with therapy Continue plan per Medicine WBAT left lower extremity   Haskel Khan 05/03/2012, 12:08 PM   Teryl Lucy, MD Cell [phone] Pager [phone]

## 2012-05-03 NOTE — Progress Notes (Signed)
ANTICOAGULATION CONSULT NOTE - Follow-Up Consult  Pharmacy Consult for Coumadin Indication: VTE prophylaxis  No Known Allergies  Patient Measurements: Height: 4\' 9"  (144.8 cm) Weight: 121 lb 4.1 oz (55 kg) IBW/kg (Calculated) : 38.6   Vital Signs: Temp: 98 F (36.7 C) (12/14 0535) Temp src: Oral (12/14 0535) BP: 104/48 mmHg (12/14 0535) Pulse Rate: 69  (12/14 0535)  Labs:  Basename 05/03/12 0615 05/02/12 1635 05/02/12 0726  HGB 10.3* -- 15.8*  HCT 29.6* -- 46.3*  PLT 193 -- 216  APTT -- -- --  LABPROT 14.3 12.7 --  INR 1.13 0.96 --  HEPARINUNFRC -- -- --  CREATININE 0.54 -- 0.61  CKTOTAL -- -- --  CKMB -- -- --  TROPONINI -- -- --    Estimated Creatinine Clearance: 56.7 ml/min (by C-G formula based on Cr of 0.54).  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 S/p IM nail for L hip fracture. Pt on coumadin for VTE prophylaxis (plan for 1 mos treatment per MD note). INR 1.13 - trending up past first dose given yesterday. Also on Lovenox 40mg  Sq daily until INR therapeutic. No bleeding noted but large drop in Hgb - will watch.  Goal of Therapy:  INR 2-3 Monitor platelets by anticoagulation protocol: Yes   Plan:  Coumadin 5 mg again tonight Daily PT/INR  Christoper Fabian, PharmD, BCPS Clinical pharmacist, pager [phone] 05/03/2012,10:51 AM

## 2012-05-03 NOTE — Progress Notes (Addendum)
[facility]                                                                                Patient Demographics  [name], is a 56 y.o. female  AVW:[id number]  WGN:[id number]  DOB - February 05, 1956  Admit date - 05/02/2012  Admitting Physician Rhetta Mura, MD  Outpatient Primary MD for the patient is Terressa Koyanagi., DO  LOS - 1   Chief Complaint  Patient presents with  . Fall        Assessment & Plan      1. Mechanical fall causing Left intertrochanteric hip fracture-status post open reduction internal fixation on 05/02/2012 by Dr. Dion Saucier of orthopedics , patient tolerated the procedure well, weight bearing and physical therapy per orthopedics, she is currently on Lovenox along with Coumadin managed by pharmacy and ordered by orthopedics for DVT prophylaxis.     2. Osteoporosis- recommend outpatient endocrinology followup. Vitamin D levels ordered upon admission are pending.     3. H/o hypertension-blood pressures well controlled on home dose ACE inhibitor.    4. History of potential Crohn's disease-outpatient followup. Patient asymptomatic at this time.    5. History B12 deficiency-get interval followup. There is some postop anemia due to intraoperative blood loss and dilution from IV fluids, reorder H&H in the morning.    6. History of Turner syndrome-see above-unclear genotype. May require genetic studies. May also require endocrinology followup as an outpatient.    7. Anxiety-situational-will prescribe Xanax when necessary.   8. ? Pulmonary fullness on CXR, CT chest today.    Code Status: Full  Family Communication: with patinet  Disposition Plan: TBD   Procedures ORIF- L.Femur 05-02-12   Consults  Orthopedics    DVT Prophylaxis  Lovenox - Coumadin  Lab Results  Component Value Date   INR 1.13 05/03/2012   INR 0.96 05/02/2012     Lab Results  Component Value Date   PLT 193 05/03/2012     Medications  Scheduled Meds:   . acetaminophen  1,000 mg Intravenous Q6H  . docusate sodium  100 mg Oral BID  . enoxaparin (LOVENOX) injection  40 mg Subcutaneous Q24H  . lisinopril  10 mg Oral Daily  . senna  1 tablet Oral BID  . warfarin   Does not apply Once  . Warfarin - Pharmacist Dosing Inpatient   Does not apply q1800   Continuous Infusions:  PRN Meds:.acetaminophen, alum & mag hydroxide-simeth, diazepam, HYDROcodone-acetaminophen, methocarbamol, ondansetron (ZOFRAN) IV, polyethylene glycol, sorbitol, zolpidem  Antibiotics     Anti-infectives     Start     Dose/Rate Route Frequency Ordered Stop   05/03/12 0000   ceFAZolin (ANCEF) IVPB 2 g/50 mL premix        2 g 100 mL/hr over 30 Minutes Intravenous Every 6 hours 05/02/12 2205 05/03/12 0657   05/02/12 1239   ceFAZolin (ANCEF) 3 g in dextrose 5 % 50 mL IVPB        3 g 160 mL/hr over 30 Minutes Intravenous 30 min pre-op 05/02/12 1239 05/02/12 1744           Time Spent in minutes   45   Erna Brossard  K M.D on 05/03/2012 at 10:57 AM  Between 7am to 7pm - Pager - [phone]  After 7pm go to [email or web address] - password [email or web address]  And look for the night coverage person covering for me after hours  [facility] Office  [phone]    Subjective:   [name] today has, No headache, No chest pain, No abdominal pain - No Nausea, No new weakness tingling or numbness, No Cough - SOB.    Objective:   Filed Vitals:   05/03/12 0034 05/03/12 0216 05/03/12 0535 05/03/12 0900  BP: 127/66 126/56 104/48   Pulse: 74 69 69   Temp: 97.3 F (36.3 C) 97.8 F (36.6 C) 98 F (36.7 C)   TempSrc: Oral Oral Oral   Resp: 17 19 18    Height:    4\' 9"  (1.448 m)  Weight:    55 kg (121 lb 4.1 oz)  SpO2: 100% 99% 99%     Wt Readings from Last 3 Encounters:  05/03/12 55 kg (121 lb 4.1 oz)  05/03/12 55 kg (121 lb 4.1 oz)  04/09/12 55.339 kg (122 lb)     Intake/Output Summary (Last 24 hours) at 05/03/12  1057 Last data filed at 05/03/12 0536  Gross per 24 hour  Intake   1250 ml  Output   2200 ml  Net   -950 ml    Exam Awake Alert, Oriented X 3, No new F.N deficits, Normal affect [place].AT,PERRAL Supple Neck,No JVD, No cervical lymphadenopathy appriciated.  Symmetrical Chest wall movement, Good air movement bilaterally, CTAB RRR,No Gallops,Rubs or new Murmurs, No Parasternal Heave +ve B.Sounds, Abd Soft, Non tender, No organomegaly appriciated, No rebound - guarding or rigidity. No Cyanosis, Clubbing or edema, No new Rash or bruise      Data Review   Micro Results No results found for this or any previous visit (from the past 240 hour(s)).  Radiology Reports Dg Chest 1 View  05/02/2012  *RADIOLOGY REPORT*  Clinical Data: Preop left hip injury  CHEST - 1 VIEW  Comparison: None.  Findings: Increased interstitial markings, likely chronic.  No focal consolidation. No pleural effusion or pneumothorax.  The heart is top normal in size.  Prominence of the right paratracheal stripe, possibly vascular.  IMPRESSION: No evidence of acute cardiopulmonary disease.  Prominence of the right paratracheal study, possibly vascular. Correlate with prior imaging, if available.  Otherwise, consider nonemergent CT with contrast to exclude underlying adenopathy.   Original Report Authenticated By: Charline Bills, M.D.    Dg Femur Left  05/02/2012  *RADIOLOGY REPORT*  Clinical Data: Left femoral intramedullary nail  DG C-ARM 61-120 MIN,LEFT FEMUR - 2 VIEW  Technique: Intraoperative fluoroscopy of the left hip and knee with AP and lateral views obtained.  Comparison:  05/02/2012 at 0850 hours  Findings: Intraoperative fluoroscopy utilized for surgical control purposes of.  AP and lateral spot fluoroscopic views of the left hip and left knee are obtained.  There has been interval reduction of previously demonstrated intertrochanteric fractures with fixation using intramedullary rod and screws.  There is a distal  locking screw.  Significant improvement of the alignment and position of the fracture fragments.  No evidence of subluxation of the hip joint.  IMPRESSION: Intraoperative fluoroscopic views of the left hip and knee obtained for surgical control purposes. Interval reduction and internal fixation of left hip fractures   Original Report Authenticated By: Burman Nieves, M.D.    Dg Femur Left  05/02/2012  *RADIOLOGY REPORT*  Clinical Data: Fall, left hip  pain  LEFT FEMUR - 2 VIEW  Comparison: None.  Findings: Comminuted intertrochanteric left hip fracture with at least three dominant fracture fragments.  Greater trochanteric fragment is mildly displaced.  Secondary varus angulation.  IMPRESSION: Comminuted intertrochanteric left hip fracture, as described above.   Original Report Authenticated By: Charline Bills, M.D.    Dg Pelvis Portable  05/02/2012  *RADIOLOGY REPORT*  Clinical Data: Postop left femoral intramedullary nail.  PORTABLE PELVIS  Comparison: Earlier same date  Findings: Examination demonstrates a left femoral intramedullary nail with associated screw bridging the femoral neck into the femoral head with hardware intact.  Mild residual lucency is seen over patient's left hip fracture in the basicervical region and lesser trochanter. The lesser trochanter is slightly more superiorly positioned than normally seen.  There is anatomic alignment about the fracture site.  There are mild symmetric degenerative changes of the hips.  IMPRESSION: Fixation of patient's left hip fracture with hardware intact and near anatomic alignment about the fracture site.  The lesser trochanter is slightly more superiorly located than normally seen.  Mild symmetric degenerative changes of the hips.   Original Report Authenticated By: Elberta Fortis, M.D.    Dg Hip Portable 1 View Left  05/02/2012  *RADIOLOGY REPORT*  Clinical Data: Postop left femoral intramedullary nail.  PORTABLE LEFT HIP - 1 VIEW  Comparison:  Earlier same date  Findings: Cross-table lateral image demonstrates an intramedullary nail and associated screw through the femoral neck into the femoral head fixating the patient's intertrochanteric fracture.  Hardware appears intact as there is normal alignment about the fracture site.  IMPRESSION: Cross-table lateral image demonstrating intact left femoral intramedullary nail and associated screw bridging patient's intertrochanteric fracture with hardware intact and normal alignment about the fracture site.   Original Report Authenticated By: Elberta Fortis, M.D.    Dg C-arm 61-120 Min  05/02/2012  *RADIOLOGY REPORT*  Clinical Data: Left femoral intramedullary nail  DG C-ARM 61-120 MIN,LEFT FEMUR - 2 VIEW  Technique: Intraoperative fluoroscopy of the left hip and knee with AP and lateral views obtained.  Comparison:  05/02/2012 at 0850 hours  Findings: Intraoperative fluoroscopy utilized for surgical control purposes of.  AP and lateral spot fluoroscopic views of the left hip and left knee are obtained.  There has been interval reduction of previously demonstrated intertrochanteric fractures with fixation using intramedullary rod and screws.  There is a distal locking screw.  Significant improvement of the alignment and position of the fracture fragments.  No evidence of subluxation of the hip joint.  IMPRESSION: Intraoperative fluoroscopic views of the left hip and knee obtained for surgical control purposes. Interval reduction and internal fixation of left hip fractures   Original Report Authenticated By: Burman Nieves, M.D.     CBC  Lab 05/03/12 0615 05/02/12 0726  WBC 10.9* 7.7  HGB 10.3* 15.8*  HCT 29.6* 46.3*  PLT 193 216  MCV 91.1 90.6  MCH 31.7 30.9  MCHC 34.8 34.1  RDW 12.7 12.8  LYMPHSABS -- 2.3  MONOABS -- 0.5  EOSABS -- 0.2  BASOSABS -- 0.0  BANDABS -- --    Chemistries   Lab 05/03/12 0615 05/02/12 0726  NA 133* 141  K 3.9 3.8  CL 98 100  CO2 26 27  GLUCOSE 135* 104*   BUN 8 15  CREATININE 0.54 0.61  CALCIUM 8.1* 9.6  MG -- --  AST -- --  ALT -- --  ALKPHOS -- --  BILITOT -- --   ------------------------------------------------------------------------------------------------------------------ estimated creatinine clearance  is 56.7 ml/min (by C-G formula based on Cr of 0.54). ------------------------------------------------------------------------------------------------------------------ No results found for this basename: HGBA1C:2 in the last 72 hours ------------------------------------------------------------------------------------------------------------------ No results found for this basename: CHOL:2,HDL:2,LDLCALC:2,TRIG:2,CHOLHDL:2,LDLDIRECT:2 in the last 72 hours ------------------------------------------------------------------------------------------------------------------ No results found for this basename: TSH,T4TOTAL,FREET3,T3FREE,THYROIDAB in the last 72 hours ------------------------------------------------------------------------------------------------------------------ No results found for this basename: VITAMINB12:2,FOLATE:2,FERRITIN:2,TIBC:2,IRON:2,RETICCTPCT:2 in the last 72 hours  Coagulation profile  Lab 05/03/12 0615 05/02/12 1635  INR 1.13 0.96  PROTIME -- --    No results found for this basename: DDIMER:2 in the last 72 hours  Cardiac Enzymes No results found for this basename: CK:3,CKMB:3,TROPONINI:3,MYOGLOBIN:3 in the last 168 hours ------------------------------------------------------------------------------------------------------------------ No components found with this basename: POCBNP:3

## 2012-05-03 NOTE — Evaluation (Signed)
Physical Therapy Evaluation Patient Details Name: [name] MRN: [id number] DOB: 1956-04-14 Today's Date: 05/03/2012 Time: 4098-1191 PT Time Calculation (min): 27 min  PT Assessment / Plan / Recommendation Clinical Impression  Pt admitted after fall at work([employer]) s/p ORIF of left femur. Pts mobility limited secondary to pain with all weight bearing through LLE. 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 Pt will benefit from skilled PT in the acute care setting in order to maximize functional mobility and strength.    PT Assessment  Patient needs continued PT services    Follow Up Recommendations  CIR;Supervision/Assistance - 24 hour    Does the patient have the potential to tolerate intense rehabilitation      Barriers to Discharge Decreased caregiver support      Equipment Recommendations  Rolling walker with 5" wheels    Recommendations for Other Services Rehab consult   Frequency Min 5X/week    Precautions / Restrictions Precautions Precautions: Fall Restrictions Weight Bearing Restrictions: Yes LLE Weight Bearing: Weight bearing as tolerated   Pertinent Vitals/Pain Pain 8/10      Mobility  Bed Mobility Bed Mobility: Supine to Sit;Sitting - Scoot to Edge of Bed Supine to Sit: 1: +2 Total assist Supine to Sit: Patient Percentage: 50% Sitting - Scoot to Edge of Bed: 3: Mod assist Details for Bed Mobility Assistance: Assist with LLE as well as assist through trunk for support as pt hesitant to bear weight through L hip upon sitting. Cues for safe technique Transfers Transfers: Sit to Stand;Stand to Sit Sit to Stand: 3: Mod assist;With upper extremity assist;From bed Stand to Sit: 3: Mod assist;With upper extremity assist;To chair/3-in-1 Stand Pivot Transfers: 3: Mod assist Details for Transfer Assistance: Assist for stability and support during descent. Cues for safe hand placement. Pt  able to take steps using RW from bed to chair, assist with LLE at first with pt able to bring LLE forward after first step.  Ambulation/Gait Ambulation/Gait Assistance: Not tested (comment)    Shoulder Instructions     Exercises General Exercises - Lower Extremity Ankle Circles/Pumps: AROM;Strengthening;Both;10 reps;Supine Quad Sets: AROM;Strengthening;Left;10 reps;Supine Gluteal Sets: AROM;Strengthening;10 reps;Supine Straight Leg Raises: AAROM;Strengthening;Left;10 reps;Limitations Straight Leg Raises Limitations: 30 degrees   PT Diagnosis: Difficulty walking;Acute pain  PT Problem List: Decreased strength;Decreased activity tolerance;Decreased range of motion;Decreased mobility;Decreased knowledge of use of DME;Decreased knowledge of precautions;Decreased safety awareness;Pain PT Treatment Interventions: DME instruction;Gait training;Stair training;Functional mobility training;Therapeutic activities;Therapeutic exercise;Patient/family education   PT Goals Acute Rehab PT Goals PT Goal Formulation: With patient Time For Goal Achievement: 05/10/12 Potential to Achieve Goals: Good Pt will go Supine/Side to Sit: with supervision PT Goal: Supine/Side to Sit - Progress: Goal set today Pt will go Sit to Supine/Side: with supervision PT Goal: Sit to Supine/Side - Progress: Goal set today Pt will go Sit to Stand: with supervision PT Goal: Sit to Stand - Progress: Goal set today Pt will go Stand to Sit: with supervision PT Goal: Stand to Sit - Progress: Goal set today Pt will Transfer Bed to Chair/Chair to Bed: with supervision PT Transfer Goal: Bed to Chair/Chair to Bed - Progress: Goal set today Pt will Ambulate: 51 - 150 feet;with supervision;with least restrictive assistive device PT Goal: Ambulate - Progress: Goal set today Pt will Perform Home Exercise Program: Independently PT Goal: Perform Home Exercise Program - Progress: Goal set today  Visit Information  Last PT Received On:  05/03/12 Assistance Needed: +1  Subjective Data  Patient Stated Goal: to be able to walk   Prior Functioning  Home Living Lives With: Spouse Available Help at Discharge: Family;Other (Comment) (husband unable to help physically; daughters work) Type of Home: House Home Access: Stairs to enter Secretary/administrator of Steps: 1 Entrance Stairs-Rails: None Home Layout: One level Bathroom Shower/Tub: Walk-in shower;Door [employer] Toilet: Pharmacist, community: Yes How Accessible: Accessible via walker Home Adaptive Equipment: None Prior Function Level of Independence: Independent Able to Take Stairs?: Yes Driving: Yes Vocation: Full time employment 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 Communication: No difficulties Dominant Hand: Right    Cognition  Overall Cognitive Status: Appears within functional limits for tasks assessed/performed Arousal/Alertness: Awake/alert Orientation Level: Appears intact for tasks assessed Behavior During Session: [facility] for tasks performed    Extremity/Trunk Assessment Right Lower Extremity Assessment RLE ROM/Strength/Tone: Within functional levels RLE Sensation: WFL - Light Touch Left Lower Extremity Assessment LLE ROM/Strength/Tone: Deficits;Unable to fully assess;Due to pain LLE ROM/Strength/Tone Deficits: Moderate quad contraction. UTA secondary to pain, mod-max assist for SLR 30 degrees LLE Sensation: WFL - Light Touch   Balance    End of Session PT - End of Session Equipment Utilized During Treatment: Gait belt Activity Tolerance: Patient limited by pain Patient left: in chair;with call bell/phone within reach;with family/visitor present Nurse Communication: Mobility status  GP     Milana Kidney 05/03/2012, 9:18 AM

## 2012-05-04 LAB — PROTIME-INR
INR: 1.71 — ABNORMAL HIGH (ref 0.00–1.49)
Prothrombin Time: 19.5 seconds — ABNORMAL HIGH (ref 11.6–15.2)

## 2012-05-04 MED ORDER — WARFARIN SODIUM 2.5 MG PO TABS
2.5000 mg | ORAL_TABLET | Freq: Once | ORAL | Status: AC
  Administered 2012-05-04: 2.5 mg via ORAL
  Filled 2012-05-04: qty 1

## 2012-05-04 NOTE — Progress Notes (Signed)
Orthopedic Tech Progress Note Patient Details:  [name] 01/27/1956 [id number]  Patient ID: [name], female   DOB: 04-Jun-1955, 56 y.o.   MRN: [id number]   [name] 05/04/2012, 2:31 PM Trapeze bar

## 2012-05-04 NOTE — Progress Notes (Signed)
Patient ID: [name], female   DOB: 10-12-1955, 56 y.o.   MRN: [id number]     Subjective:  Patient reports pain as mild.  She is able to get to the bathroom without help.   Objective:   VITALS:   Filed Vitals:   05/03/12 2143 05/04/12 0126 05/04/12 0509 05/04/12 1017  BP: 109/51 115/55 107/57 82/45  Pulse: 100 90 74 80  Temp: 98.8 F (37.1 C) 98.2 F (36.8 C) 98.1 F (36.7 C)   TempSrc: Oral Oral Oral   Resp: 18 16 18    Height:      Weight:      SpO2: 96% 98% 97% 99%    ABD soft Sensation intact distally Dorsiflexion/Plantar flexion intact Incision: dressing C/D/I and no drainage  LABS  Results for orders placed during the hospital encounter of 05/02/12 (from the past 24 hour(s))  PROTIME-INR     Status: Abnormal   Collection Time   05/04/12  7:30 AM      Component Value Range   Prothrombin Time 19.5 (*) 11.6 - 15.2 seconds   INR 1.71 (*) 0.00 - 1.49  HEMOGLOBIN AND HEMATOCRIT, BLOOD     Status: Abnormal   Collection Time   05/04/12  7:30 AM      Component Value Range   Hemoglobin 10.5 (*) 12.0 - 15.0 g/dL   HCT 40.9 (*) 81.1 - 91.4 %    Dg Femur Left  05/02/2012  *RADIOLOGY REPORT*  Clinical Data: Left femoral intramedullary nail  DG C-ARM 61-120 MIN,LEFT FEMUR - 2 VIEW  Technique: Intraoperative fluoroscopy of the left hip and knee with AP and lateral views obtained.  Comparison:  05/02/2012 at 0850 hours  Findings: Intraoperative fluoroscopy utilized for surgical control purposes of.  AP and lateral spot fluoroscopic views of the left hip and left knee are obtained.  There has been interval reduction of previously demonstrated intertrochanteric fractures with fixation using intramedullary rod and screws.  There is a distal locking screw.  Significant improvement of the alignment and position of the fracture fragments.  No evidence of subluxation of the hip joint.  IMPRESSION: Intraoperative fluoroscopic views of the left hip and knee obtained for surgical  control purposes. Interval reduction and internal fixation of left hip fractures   Original Report Authenticated By: Burman Nieves, M.D.    Ct Chest W Contrast  05/03/2012  *RADIOLOGY REPORT*  Clinical Data: Right paratracheal soft tissue prominence on the chest radiograph.  Evaluate for mass or lymphadenopathy.  CT CHEST WITH CONTRAST  Technique:  Multidetector CT imaging of the chest was performed following the standard protocol during bolus administration of intravenous contrast.  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  No mediastinal or hilar masses or lymphadenopathy identified.  No lymphadenopathy seen elsewhere within the thorax.  Mild scarring is seen in the lower lobes bilaterally.  No evidence of pulmonary infiltrate or mass.  No central endobronchial lesion identified.  IMPRESSION:  1.  No evidence of mass or lymphadenopathy. 2.  Bilateral lower lobe scarring.  No active disease.   Original Report Authenticated By: Myles Rosenthal, M.D.    Dg Pelvis Portable  05/02/2012  *RADIOLOGY REPORT*  Clinical Data: Postop left femoral intramedullary nail.  PORTABLE PELVIS  Comparison: Earlier same date  Findings: Examination demonstrates a left femoral intramedullary nail with associated screw bridging the femoral neck into the femoral head  with hardware intact.  Mild residual lucency is seen over patient's left hip fracture in the basicervical region and lesser trochanter. The lesser trochanter is slightly more superiorly positioned than normally seen.  There is anatomic alignment about the fracture site.  There are mild symmetric degenerative changes of the hips.  IMPRESSION: Fixation of patient's left hip fracture with hardware intact and near anatomic alignment about the fracture site.  The lesser trochanter is slightly more superiorly located than normally seen.   Mild symmetric degenerative changes of the hips.   Original Report Authenticated By: Elberta Fortis, M.D.    Dg Hip Portable 1 View Left  05/02/2012  *RADIOLOGY REPORT*  Clinical Data: Postop left femoral intramedullary nail.  PORTABLE LEFT HIP - 1 VIEW  Comparison: Earlier same date  Findings: Cross-table lateral image demonstrates an intramedullary nail and associated screw through the femoral neck into the femoral head fixating the patient's intertrochanteric fracture.  Hardware appears intact as there is normal alignment about the fracture site.  IMPRESSION: Cross-table lateral image demonstrating intact left femoral intramedullary nail and associated screw bridging patient's intertrochanteric fracture with hardware intact and normal alignment about the fracture site.   Original Report Authenticated By: Elberta Fortis, M.D.    Dg C-arm 61-120 Min  05/02/2012  *RADIOLOGY REPORT*  Clinical Data: Left femoral intramedullary nail  DG C-ARM 61-120 MIN,LEFT FEMUR - 2 VIEW  Technique: Intraoperative fluoroscopy of the left hip and knee with AP and lateral views obtained.  Comparison:  05/02/2012 at 0850 hours  Findings: Intraoperative fluoroscopy utilized for surgical control purposes of.  AP and lateral spot fluoroscopic views of the left hip and left knee are obtained.  There has been interval reduction of previously demonstrated intertrochanteric fractures with fixation using intramedullary rod and screws.  There is a distal locking screw.  Significant improvement of the alignment and position of the fracture fragments.  No evidence of subluxation of the hip joint.  IMPRESSION: Intraoperative fluoroscopic views of the left hip and knee obtained for surgical control purposes. Interval reduction and internal fixation of left hip fractures   Original Report Authenticated By: Burman Nieves, M.D.     Assessment/Plan: 2 Days Post-Op   Principal Problem:  *Fracture, intertrochanteric, left femur Active  Problems:  B12 DEFICIENCY  HYPERTENSION  HEMORRHOIDS  INFLAMMATORY BOWEL DISEASE  COLONIC POLYPS, HYPERPLASTIC, HX OF  H/O Turner syndrome   Advance diet Up with therapy Will plan to DC tomorrow to SNF or to IPR   Haskel Khan 05/04/2012, 3:14 PM   Teryl Lucy, MD Cell [phone] Pager [phone]

## 2012-05-04 NOTE — Progress Notes (Signed)
[facility]                                                                                Patient Demographics  [name], is a 56 y.o. female  XBJ:[id number]  HYQ:[id number]  DOB - 1956/03/28  Admit date - 05/02/2012  Admitting Physician Rhetta Mura, MD  Outpatient Primary MD for the patient is Terressa Koyanagi., DO  LOS - 2   Chief Complaint  Patient presents with  . Fall        Assessment & Plan      1. Mechanical fall causing Left intertrochanteric hip fracture-status post open reduction internal fixation on 05/02/2012 by Dr. Dion Saucier of orthopedics , patient tolerated the procedure well, weight bearing and physical therapy per orthopedics, she is currently on Lovenox along with Coumadin managed by pharmacy and ordered by orthopedics for DVT prophylaxis.     2. Osteoporosis- recommend outpatient endocrinology followup. Vitamin D levels ordered upon admission are pending.     3. H/o hypertension-blood pressures well controlled on home dose ACE inhibitor.    4. History of potential Crohn's disease-outpatient followup. Patient asymptomatic at this time.    5. History B12 deficiency-get interval followup. There is some postop anemia due to intraoperative blood loss and dilution from IV fluids, reorder H&H in the morning.    6. History of Turner syndrome-see above-unclear genotype. May require genetic studies. May also require endocrinology followup as an outpatient.    7. Anxiety-situational-will prescribe Xanax when necessary.    8. ? Pulmonary fullness on CXR, CT chest stable.    Code Status: Full  Family Communication: with patinet  Disposition Plan: TBD   Procedures ORIF- L.Femur 05-02-12 by Dr. Dion Saucier   Consults  Orthopedics    DVT Prophylaxis  Lovenox - Coumadin  Lab Results  Component Value Date   INR 1.71* 05/04/2012   INR 1.13 05/03/2012   INR 0.96 05/02/2012     Lab Results  Component Value Date    PLT 193 05/03/2012    Medications  Scheduled Meds:    . docusate sodium  100 mg Oral BID  . enoxaparin (LOVENOX) injection  40 mg Subcutaneous Q24H  . lisinopril  10 mg Oral Daily  . senna  1 tablet Oral BID  . warfarin  2.5 mg Oral ONCE-1800  . Warfarin - Pharmacist Dosing Inpatient   Does not apply q1800   Continuous Infusions:  PRN Meds:.acetaminophen, alum & mag hydroxide-simeth, diazepam, HYDROcodone-acetaminophen, methocarbamol, ondansetron (ZOFRAN) IV, polyethylene glycol, sorbitol, zolpidem  Antibiotics     Anti-infectives     Start     Dose/Rate Route Frequency Ordered Stop   05/03/12 0000   ceFAZolin (ANCEF) IVPB 2 g/50 mL premix        2 g 100 mL/hr over 30 Minutes Intravenous Every 6 hours 05/02/12 2205 05/03/12 0657   05/02/12 1239   ceFAZolin (ANCEF) 3 g in dextrose 5 % 50 mL IVPB        3 g 160 mL/hr over 30 Minutes Intravenous 30 min pre-op 05/02/12 1239 05/02/12 1744           Time Spent in minutes   45  Leroy Sea M.D on 05/04/2012 at 9:55 AM  Between 7am to 7pm - Pager - [phone]  After 7pm go to [email or web address] - password [email or web address]  And look for the night coverage person covering for me after hours  [facility] Office  [phone]    Subjective:   [name] today has, No headache, No chest pain, No abdominal pain - No Nausea, No new weakness tingling or numbness, No Cough - SOB. Minimal left hip pain.  Objective:   Filed Vitals:   05/03/12 1900 05/03/12 2143 05/04/12 0126 05/04/12 0509  BP: 109/48 109/51 115/55 107/57  Pulse: 89 100 90 74  Temp: 98.6 F (37 C) 98.8 F (37.1 C) 98.2 F (36.8 C) 98.1 F (36.7 C)  TempSrc: Oral Oral Oral Oral  Resp: 18 18 16 18   Height:      Weight:      SpO2: 98% 96% 98% 97%    Wt Readings from Last 3 Encounters:  05/03/12 55 kg (121 lb 4.1 oz)  05/03/12 55 kg (121 lb 4.1 oz)  04/09/12 55.339 kg (122 lb)     Intake/Output Summary (Last 24 hours) at 05/04/12  0955 Last data filed at 05/04/12 0510  Gross per 24 hour  Intake    920 ml  Output   2950 ml  Net  -2030 ml    Exam Awake Alert, Oriented X 3, No new F.N deficits, Normal affect [place].AT,PERRAL Supple Neck,No JVD, No cervical lymphadenopathy appriciated.  Symmetrical Chest wall movement, Good air movement bilaterally, CTAB RRR,No Gallops,Rubs or new Murmurs, No Parasternal Heave +ve B.Sounds, Abd Soft, Non tender, No organomegaly appriciated, No rebound - guarding or rigidity. No Cyanosis, Clubbing or edema, No new Rash or bruise      Data Review   Micro Results No results found for this or any previous visit (from the past 240 hour(s)).  Radiology Reports Dg Chest 1 View  05/02/2012  *RADIOLOGY REPORT*  Clinical Data: Preop left hip injury  CHEST - 1 VIEW  Comparison: None.  Findings: Increased interstitial markings, likely chronic.  No focal consolidation. No pleural effusion or pneumothorax.  The heart is top normal in size.  Prominence of the right paratracheal stripe, possibly vascular.  IMPRESSION: No evidence of acute cardiopulmonary disease.  Prominence of the right paratracheal study, possibly vascular. Correlate with prior imaging, if available.  Otherwise, consider nonemergent CT with contrast to exclude underlying adenopathy.   Original Report Authenticated By: Charline Bills, M.D.    Dg Femur Left  05/02/2012  *RADIOLOGY REPORT*  Clinical Data: Left femoral intramedullary nail  DG C-ARM 61-120 MIN,LEFT FEMUR - 2 VIEW  Technique: Intraoperative fluoroscopy of the left hip and knee with AP and lateral views obtained.  Comparison:  05/02/2012 at 0850 hours  Findings: Intraoperative fluoroscopy utilized for surgical control purposes of.  AP and lateral spot fluoroscopic views of the left hip and left knee are obtained.  There has been interval reduction of previously demonstrated intertrochanteric fractures with fixation using intramedullary rod and screws.  There is a distal  locking screw.  Significant improvement of the alignment and position of the fracture fragments.  No evidence of subluxation of the hip joint.  IMPRESSION: Intraoperative fluoroscopic views of the left hip and knee obtained for surgical control purposes. Interval reduction and internal fixation of left hip fractures   Original Report Authenticated By: Burman Nieves, M.D.    Dg Femur Left  05/02/2012  *RADIOLOGY REPORT*  Clinical Data: Fall, left hip pain  LEFT FEMUR - 2 VIEW  Comparison: None.  Findings: Comminuted intertrochanteric left hip fracture with at least three dominant fracture fragments.  Greater trochanteric fragment is mildly displaced.  Secondary varus angulation.  IMPRESSION: Comminuted intertrochanteric left hip fracture, as described above.   Original Report Authenticated By: Charline Bills, M.D.    Dg Pelvis Portable  05/02/2012  *RADIOLOGY REPORT*  Clinical Data: Postop left femoral intramedullary nail.  PORTABLE PELVIS  Comparison: Earlier same date  Findings: Examination demonstrates a left femoral intramedullary nail with associated screw bridging the femoral neck into the femoral head with hardware intact.  Mild residual lucency is seen over patient's left hip fracture in the basicervical region and lesser trochanter. The lesser trochanter is slightly more superiorly positioned than normally seen.  There is anatomic alignment about the fracture site.  There are mild symmetric degenerative changes of the hips.  IMPRESSION: Fixation of patient's left hip fracture with hardware intact and near anatomic alignment about the fracture site.  The lesser trochanter is slightly more superiorly located than normally seen.  Mild symmetric degenerative changes of the hips.   Original Report Authenticated By: Elberta Fortis, M.D.    Dg Hip Portable 1 View Left  05/02/2012  *RADIOLOGY REPORT*  Clinical Data: Postop left femoral intramedullary nail.  PORTABLE LEFT HIP - 1 VIEW  Comparison:  Earlier same date  Findings: Cross-table lateral image demonstrates an intramedullary nail and associated screw through the femoral neck into the femoral head fixating the patient's intertrochanteric fracture.  Hardware appears intact as there is normal alignment about the fracture site.  IMPRESSION: Cross-table lateral image demonstrating intact left femoral intramedullary nail and associated screw bridging patient's intertrochanteric fracture with hardware intact and normal alignment about the fracture site.   Original Report Authenticated By: Elberta Fortis, M.D.    Dg C-arm 61-120 Min  05/02/2012  *RADIOLOGY REPORT*  Clinical Data: Left femoral intramedullary nail  DG C-ARM 61-120 MIN,LEFT FEMUR - 2 VIEW  Technique: Intraoperative fluoroscopy of the left hip and knee with AP and lateral views obtained.  Comparison:  05/02/2012 at 0850 hours  Findings: Intraoperative fluoroscopy utilized for surgical control purposes of.  AP and lateral spot fluoroscopic views of the left hip and left knee are obtained.  There has been interval reduction of previously demonstrated intertrochanteric fractures with fixation using intramedullary rod and screws.  There is a distal locking screw.  Significant improvement of the alignment and position of the fracture fragments.  No evidence of subluxation of the hip joint.  IMPRESSION: Intraoperative fluoroscopic views of the left hip and knee obtained for surgical control purposes. Interval reduction and internal fixation of left hip fractures   Original Report Authenticated By: Burman Nieves, M.D.     CBC  Lab 05/04/12 0730 05/03/12 0615 05/02/12 0726  WBC -- 10.9* 7.7  HGB 10.5* 10.3* 15.8*  HCT 31.1* 29.6* 46.3*  PLT -- 193 216  MCV -- 91.1 90.6  MCH -- 31.7 30.9  MCHC -- 34.8 34.1  RDW -- 12.7 12.8  LYMPHSABS -- -- 2.3  MONOABS -- -- 0.5  EOSABS -- -- 0.2  BASOSABS -- -- 0.0  BANDABS -- -- --    Chemistries   Lab 05/03/12 0615 05/02/12 0726  NA 133* 141   K 3.9 3.8  CL 98 100  CO2 26 27  GLUCOSE 135* 104*  BUN 8 15  CREATININE 0.54 0.61  CALCIUM 8.1* 9.6  MG -- --  AST -- --  ALT -- --  ALKPHOS -- --  BILITOT -- --   ------------------------------------------------------------------------------------------------------------------ estimated creatinine clearance is 56.7 ml/min (by C-G formula based on Cr of 0.54). ------------------------------------------------------------------------------------------------------------------ No results found for this basename: HGBA1C:2 in the last 72 hours ------------------------------------------------------------------------------------------------------------------ No results found for this basename: CHOL:2,HDL:2,LDLCALC:2,TRIG:2,CHOLHDL:2,LDLDIRECT:2 in the last 72 hours ------------------------------------------------------------------------------------------------------------------ No results found for this basename: TSH,T4TOTAL,FREET3,T3FREE,THYROIDAB in the last 72 hours ------------------------------------------------------------------------------------------------------------------ No results found for this basename: VITAMINB12:2,FOLATE:2,FERRITIN:2,TIBC:2,IRON:2,RETICCTPCT:2 in the last 72 hours  Coagulation profile  Lab 05/04/12 0730 05/03/12 0615 05/02/12 1635  INR 1.71* 1.13 0.96  PROTIME -- -- --    No results found for this basename: DDIMER:2 in the last 72 hours  Cardiac Enzymes No results found for this basename: CK:3,CKMB:3,TROPONINI:3,MYOGLOBIN:3 in the last 168 hours ------------------------------------------------------------------------------------------------------------------ No components found with this basename: POCBNP:3

## 2012-05-04 NOTE — Progress Notes (Signed)
ANTICOAGULATION CONSULT NOTE - Follow-Up Consult  Pharmacy Consult for Coumadin Indication: VTE prophylaxis  No Known Allergies  Patient Measurements: Height: 4\' 9"  (144.8 cm) Weight: 121 lb 4.1 oz (55 kg) IBW/kg (Calculated) : 38.6   Vital Signs: Temp: 98.1 F (36.7 C) (12/15 0509) Temp src: Oral (12/15 0509) BP: 107/57 mmHg (12/15 0509) Pulse Rate: 74  (12/15 0509)  Labs:  Basename 05/04/12 0730 05/03/12 0615 05/02/12 1635 05/02/12 0726  HGB 10.5* 10.3* -- --  HCT 31.1* 29.6* -- 46.3*  PLT -- 193 -- 216  APTT -- -- -- --  LABPROT 19.5* 14.3 12.7 --  INR 1.71* 1.13 0.96 --  HEPARINUNFRC -- -- -- --  CREATININE -- 0.54 -- 0.61  CKTOTAL -- -- -- --  CKMB -- -- -- --  TROPONINI -- -- -- --    Estimated Creatinine Clearance: 56.7 ml/min (by C-G formula based on Cr of 0.54).  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 S/p IM nail for L hip fracture. Pt on coumadin for VTE prophylaxis (plan for 1 mos treatment per MD note). INR 1.71 - large increase past 2 doses of 5mg . Also on Lovenox 40mg  SQ daily until INR therapeutic. No bleeding noted . Hgb stable past 24 hours.  Goal of Therapy:  INR 2-3 Monitor platelets by anticoagulation protocol: Yes   Plan:  Coumadin 2.5 mg tonight Daily PT/INR  Christoper Fabian, PharmD, BCPS Clinical pharmacist, pager [phone] 05/04/2012,9:43 AM

## 2012-05-04 NOTE — Progress Notes (Signed)
Physical Therapy Treatment Patient Details Name: [name] MRN: [id number] DOB: Jun 05, 1955 Today's Date: 05/04/2012 Time: 0937-1000 PT Time Calculation (min): 23 min  PT Assessment / Plan / Recommendation Comments on Treatment Session  Pt progressing well, able to ambulate this session with only 1 rest break. Pt still hesitant to bear weight through LLE limiting hip and knee flexion during gait. Continue per plan    Follow Up Recommendations  CIR;Supervision/Assistance - 24 hour     Does the patient have the potential to tolerate intense rehabilitation     Barriers to Discharge        Equipment Recommendations  Rolling walker with 5" wheels    Recommendations for Other Services Rehab consult  Frequency Min 5X/week   Plan Discharge plan remains appropriate;Frequency remains appropriate    Precautions / Restrictions Precautions Precautions: Fall Restrictions Weight Bearing Restrictions: Yes LLE Weight Bearing: Weight bearing as tolerated   Pertinent Vitals/Pain Pain 5/10 in hip with sitting    Mobility  Bed Mobility Bed Mobility: Not assessed Transfers Transfers: Sit to Stand;Stand to Sit Sit to Stand: 4: Min assist;With upper extremity assist;From chair/3-in-1 Stand to Sit: 4: Min assist;With upper extremity assist;To chair/3-in-1 Details for Transfer Assistance: Min assist for stability. Cues for safe hand placement to/from RW. Pt with decreased weightbearing on LLE throughout Ambulation/Gait Ambulation/Gait Assistance: 4: Min assist Ambulation Distance (Feet): 30 Feet Assistive device: Rolling walker Ambulation/Gait Assistance Details: VC for increased hip and knee flexion, pt with stiff leg. Increased step length with LLE with distance. 1 sitting rest break needed secondary to fatigue Gait Pattern: Decreased stance time - right;Decreased step length - left;Decreased hip/knee flexion - left;Decreased weight shift to left;Narrow base of support    Exercises  General Exercises - Lower Extremity Heel Slides: AAROM;Strengthening;10 reps;Left;Supine Hip ABduction/ADduction: AAROM;Strengthening;Left;10 reps;Supine Straight Leg Raises: AAROM;Strengthening;Left;10 reps;Limitations Straight Leg Raises Limitations: 40   PT Diagnosis:    PT Problem List:   PT Treatment Interventions:     PT Goals Acute Rehab PT Goals PT Goal: Sit to Stand - Progress: Progressing toward goal PT Goal: Stand to Sit - Progress: Progressing toward goal PT Transfer Goal: Bed to Chair/Chair to Bed - Progress: Progressing toward goal PT Goal: Ambulate - Progress: Progressing toward goal PT Goal: Perform Home Exercise Program - Progress: Progressing toward goal  Visit Information  Last PT Received On: 05/04/12 Assistance Needed: +1    Subjective Data      Cognition  Overall Cognitive Status: Appears within functional limits for tasks assessed/performed Arousal/Alertness: Awake/alert Orientation Level: Appears intact for tasks assessed Behavior During Session: [facility] for tasks performed    Balance     End of Session PT - End of Session Equipment Utilized During Treatment: Gait belt Activity Tolerance: Patient tolerated treatment well Patient left: in chair;with call bell/phone within reach;with family/visitor present Nurse Communication: Mobility status   GP     Milana Kidney 05/04/2012, 10:46 AM

## 2012-05-05 ENCOUNTER — Encounter: Payer: 59 | Admitting: Gastroenterology

## 2012-05-05 DIAGNOSIS — S72143A Displaced intertrochanteric fracture of unspecified femur, initial encounter for closed fracture: Secondary | ICD-10-CM

## 2012-05-05 DIAGNOSIS — D62 Acute posthemorrhagic anemia: Secondary | ICD-10-CM

## 2012-05-05 DIAGNOSIS — W19XXXA Unspecified fall, initial encounter: Secondary | ICD-10-CM

## 2012-05-05 LAB — PROTIME-INR: Prothrombin Time: 25 seconds — ABNORMAL HIGH (ref 11.6–15.2)

## 2012-05-05 LAB — HEMOGLOBIN AND HEMATOCRIT, BLOOD: HCT: 28.2 % — ABNORMAL LOW (ref 36.0–46.0)

## 2012-05-05 MED ORDER — WARFARIN SODIUM 1 MG PO TABS
1.0000 mg | ORAL_TABLET | Freq: Once | ORAL | Status: DC
  Administered 2012-05-05: 1 mg via ORAL
  Filled 2012-05-05: qty 1

## 2012-05-05 MED ORDER — HYDROCODONE-ACETAMINOPHEN 10-325 MG PO TABS: 1.0000 | ORAL_TABLET | Freq: Four times a day (QID) | ORAL | Status: DC | PRN

## 2012-05-05 MED ORDER — WARFARIN SODIUM 2 MG PO TABS: 2.0000 mg | ORAL_TABLET | Freq: Every day | ORAL | Status: DC

## 2012-05-05 NOTE — Evaluation (Signed)
Occupational Therapy Evaluation Patient Details Name: [name] MRN: [id number] DOB: [birth year] Today's Date: 05/05/2012 Time: 4098-1191 OT Time Calculation (min): 39 min  OT Assessment / Plan / Recommendation Clinical Impression  Pt is a 56yr old female admitted after fall resulting in left hip fracture.  She underwent ORIF via Dr. Dion Saucier and is now WBAT.  She currently presents with decreased overall independence with basic selfcare tasks and functional transfers.  Feel she will benefit from acute care OT to help increase her independence.  Also feel she will benefit from  follow-up CIR level therapise to reach modified independent level for home.    OT Assessment  Patient needs continued OT Services    Follow Up Recommendations  CIR    Barriers to Discharge Decreased caregiver support Husband cannot provide physical assist.  Equipment Recommendations  3 in 1 bedside comode;Tub/shower seat    Recommendations for Other Services Rehab consult  Frequency  Min 2X/week    Precautions / Restrictions Precautions Precautions: Fall Restrictions Weight Bearing Restrictions: No LLE Weight Bearing: Weight bearing as tolerated   Pertinent Vitals/Pain Pain 4/10 in the left hip    ADL  Eating/Feeding: Simulated;Independent Where Assessed - Eating/Feeding: Chair Grooming: Performed;Wash/dry face;Minimal assistance Where Assessed - Grooming: Supported standing Upper Body Bathing: Simulated;Set up Where Assessed - Upper Body Bathing: Unsupported sitting Lower Body Bathing: Simulated;Minimal assistance Where Assessed - Lower Body Bathing: Supported sit to stand Upper Body Dressing: Simulated;Set up Where Assessed - Upper Body Dressing: Unsupported sitting Lower Body Dressing: Simulated;Moderate assistance Where Assessed - Lower Body Dressing: Unsupported sit to stand Toilet Transfer: Simulated;Minimal assistance Toilet Transfer Method: Other (comment) (ambulate with the RW) Toilet  Transfer Equipment: Comfort height toilet;Grab bars Toileting - Clothing Manipulation and Hygiene: Simulated;Minimal assistance Where Assessed - Toileting Clothing Manipulation and Hygiene: Sit to stand from 3-in-1 or toilet Tub/Shower Transfer Method: Not assessed Equipment Used: Rolling walker Transfers/Ambulation Related to ADLs: Pt is overall min guard assist for short distance ambulation in the room with her RW.  Needs mod instructional cueing to sequence walker and stepping.   ADL Comments: Demonstrates decreased ability to perform LB selfcare tasks secondary to not being able to tolerate reaching the LLE.  Began education on AE for LB dressing tasks.  Pt needs to be modified independent level in order to discharge home with husband who cannot provide any physical assist.    OT Diagnosis: Generalized weakness;Acute pain  OT Problem List: Decreased strength;Impaired balance (sitting and/or standing);Decreased knowledge of use of DME or AE;Pain OT Treatment Interventions: Self-care/ADL training;DME and/or AE instruction;Patient/family education;Balance training;Therapeutic activities   OT Goals Acute Rehab OT Goals OT Goal Formulation: With patient Time For Goal Achievement: 05/12/12 Potential to Achieve Goals: Good ADL Goals Pt Will Perform Grooming: with modified independence;Supported;Standing at sink ADL Goal: Grooming - Progress: Goal set today Pt Will Perform Lower Body Bathing: with modified independence;Sit to stand from bed;with adaptive equipment ADL Goal: Lower Body Bathing - Progress: Goal set today Pt Will Perform Lower Body Dressing: with modified independence;Sit to stand from bed ADL Goal: Lower Body Dressing - Progress: Goal set today Pt Will Perform Tub/Shower Transfer: with modified independence;with DME;Ambulation ADL Goal: Tub/Shower Transfer - Progress: Goal set today  Visit Information  Last OT Received On: 05/05/12 Assistance Needed: +1    Subjective Data   Subjective: I need to be as indendent as possible. Patient Stated Goal: Hopefully to go to rehab and home.   Prior Functioning     Home Living Lives  With: Spouse Available Help at Discharge: Family;Other (Comment) (husband unable to help physically; daughters work) Type of Home: House Home Access: Stairs to enter Secretary/administrator of Steps: 1 Entrance Stairs-Rails: None Home Layout: One level Bathroom Shower/Tub: Walk-in shower;Door [employer] Toilet: Pharmacist, community: Yes How Accessible: Accessible via walker Home Adaptive Equipment: None Prior Function Level of Independence: Independent Able to Take Stairs?: Yes Driving: Yes Vocation: Full time employment Communication Communication: No difficulties Dominant Hand: Right         Vision/Perception Vision - Assessment Eye Alignment: Within Functional Limits Vision Assessment: Vision not tested Perception Perception: Within Functional Limits Praxis Praxis: Intact   Cognition  Overall Cognitive Status: Appears within functional limits for tasks assessed/performed Arousal/Alertness: Awake/alert Orientation Level: Appears intact for tasks assessed Behavior During Session: [employer] for tasks performed    Extremity/Trunk Assessment Right Upper Extremity Assessment RUE ROM/Strength/Tone: Within functional levels RUE Sensation: WFL - Light Touch RUE Coordination: WFL - gross/fine motor Left Upper Extremity Assessment LUE ROM/Strength/Tone: Within functional levels LUE Sensation: WFL - Light Touch LUE Coordination: WFL - gross/fine motor Trunk Assessment Trunk Assessment: Kyphotic     Mobility Transfers Transfers: Sit to Stand Sit to Stand: 4: Min guard;With upper extremity assist Stand to Sit: 4: Min guard;Without upper extremity assist           Balance Balance Balance Assessed: Yes Dynamic Standing Balance Dynamic Standing - Balance Support: Right upper extremity supported;Left upper  extremity supported Dynamic Standing - Level of Assistance: 4: Min assist   End of Session OT - End of Session Equipment Utilized During Treatment: Gait belt Activity Tolerance: Patient tolerated treatment well Patient left: in chair;with call bell/phone within reach Nurse Communication: Mobility status     Marty Sadlowski OTR/L Pager number [phone] 05/05/2012, 12:36 PM

## 2012-05-05 NOTE — Clinical Social Work Note (Signed)
Clinical Social Worker received referral for SNF placement by MD and CSW reviewed chart and saw discharge plan is for CIR. CSW will sign off, as social work intervention is no longer needed.   [name] MSW, [employer] [phone]

## 2012-05-05 NOTE — Progress Notes (Signed)
Noted that pt has workmans comp due to work related injury. Have talked with [facility] [employer] to inquire about it and called the workmans comp co. Have determined that pt's case has not been opened yet. I have requested the case to be opened so that I can get approval for CIR admission. PT and OT recommending CIR. Will follow-up as soon as I have approval from insurance.  Patient is aware of situation. Pt's CSW has been informed of above. For questions call [phone]

## 2012-05-05 NOTE — Progress Notes (Signed)
Rehab Admissions Coordinator Note:  Patient was screened by Trish Mage for appropriateness for an Inpatient Acute Rehab Consult.  At this time, we are recommending Inpatient Rehab consult.  An inpatient rehab consult has already been ordered and completion is pending.  Trish Mage 05/05/2012, 8:42 AM  I can be reached at [phone].

## 2012-05-05 NOTE — Progress Notes (Signed)
Patient ID: Bralee Feldt, female   DOB: [birth year], 56 y.o.   MRN: [id number]     Subjective:  Patient reports pain as mild.  She states that she is ready for rehab.  Objective:   VITALS:   Filed Vitals:   05/04/12 1017 05/04/12 1415 05/04/12 2249 05/05/12 0605  BP: 82/45 112/47 107/56 118/65  Pulse: 80 102 96 84  Temp:  98.2 F (36.8 C) 98.2 F (36.8 C) 98.1 F (36.7 C)  TempSrc:  Oral Oral Oral  Resp:   17 16  Height:      Weight:      SpO2: 99% 96% 99% 100%    ABD soft Sensation intact distally Dorsiflexion/Plantar flexion intact Incision: dressing C/D/I and no drainage  LABS  Results for orders placed during the hospital encounter of 05/02/12 (from the past 24 hour(s))  PROTIME-INR     Status: Abnormal   Collection Time   05/05/12  7:09 AM      Component Value Range   Prothrombin Time 25.0 (*) 11.6 - 15.2 seconds   INR 2.39 (*) 0.00 - 1.49  HEMOGLOBIN AND HEMATOCRIT, BLOOD     Status: Abnormal   Collection Time   05/05/12  7:09 AM      Component Value Range   Hemoglobin 9.6 (*) 12.0 - 15.0 g/dL   HCT 08.6 (*) 57.8 - 46.9 %    Ct Chest W Contrast  05/03/2012  *RADIOLOGY REPORT*  Clinical Data: Right paratracheal soft tissue prominence on the chest radiograph.  Evaluate for mass or lymphadenopathy.  CT CHEST WITH CONTRAST  Technique:  Multidetector CT imaging of the chest was performed following the standard protocol during bolus administration of intravenous contrast.  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  No mediastinal or hilar masses or lymphadenopathy identified.  No lymphadenopathy seen elsewhere within the thorax.  Mild scarring is seen in the lower lobes bilaterally.  No evidence of pulmonary infiltrate or mass.  No central endobronchial lesion identified.  IMPRESSION:  1.  No evidence of mass or  lymphadenopathy. 2.  Bilateral lower lobe scarring.  No active disease.   Original Report Authenticated By: Myles Rosenthal, M.D.     Assessment/Plan: 3 Days Post-Op   Principal Problem:  *Fracture, intertrochanteric, left femur Active Problems:  B12 DEFICIENCY  HYPERTENSION  HEMORRHOIDS  INFLAMMATORY BOWEL DISEASE  COLONIC POLYPS, HYPERPLASTIC, HX OF  H/O Turner syndrome   Advance diet Up with therapy Discharge to SNF vs. CIR Continue plan per medicine. WBAT left lower ext. Dry dressing change as needed   Haskel Khan 05/05/2012, 9:10 AM   Teryl Lucy, MD Cell [phone] Pager [phone]

## 2012-05-05 NOTE — Progress Notes (Deleted)
Met with patient at bedside to discuss CIR. Pt would benefit from inpatient rehab and is in agreement with plan for CIR. I am working on insurance approval for CIR.  For questions, call [phone]  

## 2012-05-05 NOTE — Progress Notes (Signed)
ANTICOAGULATION CONSULT NOTE - Follow-Up Consult  Pharmacy Consult for Coumadin Indication: VTE prophylaxis  No Known Allergies  Patient Measurements: Height: 4\' 9"  (144.8 cm) Weight: 121 lb 4.1 oz (55 kg) IBW/kg (Calculated) : 38.6   Vital Signs: Temp: 98.1 F (36.7 C) (12/16 0605) Temp src: Oral (12/16 0605) BP: 118/65 mmHg (12/16 0605) Pulse Rate: 82  (12/16 1222)  Labs:  Basename 05/05/12 0709 05/04/12 0730 05/03/12 0615  HGB 9.6* 10.5* --  HCT 28.2* 31.1* 29.6*  PLT -- -- 193  APTT -- -- --  LABPROT 25.0* 19.5* 14.3  INR 2.39* 1.71* 1.13  HEPARINUNFRC -- -- --  CREATININE -- -- 0.54  CKTOTAL -- -- --  CKMB -- -- --  TROPONINI -- -- --    Estimated Creatinine Clearance: 56.7 ml/min (by C-G formula based on Cr of 0.54).  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 S/p IM nail for L hip fracture. Pt on coumadin for VTE prophylaxis (plan for 1 mos treatment per MD note). INR 2.39- after 5 - 5- 2.5 mg doses. Large 5.5 sec jump in protime. No bleeding noted . H/H down to 9.6/28.2 from 10.5/31.1 2nd post op ABLA.  LMWH has been stopped 2nd tx INR.  Coumadin education completed 05/03/12 by CA.   Goal of Therapy:  INR 2-3   Plan:  Coumadin 1 mg tonight Daily PT/INR Herby Abraham, Pharm.D. [phone] 05/05/2012 1:18 PM

## 2012-05-05 NOTE — Consult Note (Signed)
Physical Medicine and Rehabilitation Consult Reason for Consult: Hip fracture Referring Physician: [facility]   HPI: [name] is a 56 y.o. right-handed female who is an employee at [employer] system admitted 05/02/2012 after trying to load a very heavy pallet of food when she fell backwards landing on her left hip without loss of consciousness. X-rays and imaging revealed left intertrochanteric hip fracture. Underwent intramedullary nail 05/02/2012 per Dr. Dion Saucier. Advised weightbearing as tolerated. Placed on Coumadin for DVT prophylaxis. Postoperative anemia 10.3 and monitored. Pain management with the use of Vicodin. Physical therapy evaluation completed an ongoing with recommendations of physical medicine rehabilitation consult to consider inpatient rehabilitation services.   Review of Systems  Gastrointestinal: Positive for heartburn.  Musculoskeletal: Positive for myalgias.  All other systems reviewed and are negative.   Past Medical History  Diagnosis Date  . Hypertension     off meds after wt loss  . Ulcerative colitis     hx  . Osteopenia   . GERD (gastroesophageal reflux disease)     hx-contrlled now  . Blood in stool   . Colon polyp   . IBD (inflammatory bowel disease)   . Hemorrhoids   . Diverticulosis   . Vitamin B12 deficiency   . Turner's syndrome     was on Provera and Premarin and d/c this  at age 89yr  . Fracture, intertrochanteric, left femur 05/02/2012   Past Surgical History  Procedure Date  . Hernia repair     lt ing   . Tonsillectomy   . Fracture surgery 2004    fx lt thumb  . Upper gastrointestinal endoscopy   . Colonoscopy   . Orif finger fracture 06/14/2011    Procedure: OPEN REDUCTION INTERNAL FIXATION (ORIF) METACARPAL (FINGER) FRACTURE;  Surgeon: Tami Ribas, MD;  Location: [facility];  Service: Orthopedics;  Laterality: Right;  right ring   Family History  Problem Relation Age of Onset  . Lymphoma Mother    nhl-mantle cell  . Lymphoma Father     nhl   Social History:  reports that she has never smoked. She has never used smokeless tobacco. She reports that she drinks alcohol. She reports that she does not use illicit drugs. Allergies: No Known Allergies Medications Prior to Admission  Medication Sig Dispense Refill  . lisinopril (PRINIVIL,ZESTRIL) 10 MG tablet Take 1 tablet (10 mg total) by mouth daily.  90 tablet  3  . Multiple Vitamin (MULTIVITAMIN) tablet Take 1 tablet by mouth daily.      . risedronate (ACTONEL) 150 MG tablet Take 150 mg by mouth every 30 (thirty) days. with water on empty stomach, nothing by mouth or lie down for next 30 minutes.      Marland Kitchen VITAMIN D, CHOLECALCIFEROL, PO Take by mouth.        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 Functional Status:  Mobility: Bed Mobility Bed Mobility: Not assessed Supine to Sit: 1: +2 Total assist Supine to Sit: Patient Percentage: 50% Sitting - Scoot to Edge of Bed: 3: Mod assist Transfers Transfers:  Sit to Stand;Stand to Sit Sit to Stand: 4: Min assist;With upper extremity assist;From chair/3-in-1 Stand to Sit: 4: Min assist;With upper extremity assist;To chair/3-in-1 Stand Pivot Transfers: 3: Mod assist Ambulation/Gait Ambulation/Gait Assistance: 4: Min assist Ambulation Distance (Feet): 30 Feet Assistive device: Rolling walker Ambulation/Gait Assistance Details: VC for increased hip  and knee flexion, pt with stiff leg. Increased step length with LLE with distance. 1 sitting rest break needed secondary to fatigue Gait Pattern: Decreased stance time - right;Decreased step length - left;Decreased hip/knee flexion - left;Decreased weight shift to left;Narrow base of support    ADL:    Cognition: Cognition Arousal/Alertness: Awake/alert Orientation Level: Oriented X4 Cognition Overall Cognitive Status: Appears within functional limits for tasks assessed/performed Arousal/Alertness: Awake/alert Orientation Level: Appears intact for tasks assessed Behavior During Session: [facility] for tasks performed  Blood pressure 107/56, pulse 96, temperature 98.2 F (36.8 C), temperature source Oral, resp. rate 17, height 4\' 9"  (1.448 m), weight 55 kg (121 lb 4.1 oz), SpO2 99.00%. Physical Exam  Vitals reviewed. Constitutional: She is oriented to person, place, and time. She appears well-developed.  HENT:  Head: Normocephalic.  Eyes:       Pupils round and reactive to light  Neck: Normal range of motion. Neck supple. No thyromegaly present.  Cardiovascular: Normal rate and regular rhythm.   Pulmonary/Chest: Breath sounds normal. She has no wheezes.  Abdominal: Soft. Bowel sounds are normal. She exhibits no distension.  Musculoskeletal: She exhibits no edema.       Left hip tender  Neurological: She is alert and oriented to person, place, and time.       Follows full commands. Moves all 4's. No sensory deficits  Skin:       Left hip incision clean and dry  Psychiatric: She has a normal mood and affect. Her behavior is normal. Judgment and thought content normal.    Results for orders placed during the hospital encounter of 05/02/12 (from the past 24 hour(s))  PROTIME-INR     Status: Abnormal   Collection Time   05/04/12  7:30 AM      Component Value Range   Prothrombin Time 19.5 (*) 11.6 - 15.2 seconds   INR 1.71 (*) 0.00 - 1.49  HEMOGLOBIN AND HEMATOCRIT, BLOOD     Status:  Abnormal   Collection Time   05/04/12  7:30 AM      Component Value Range   Hemoglobin 10.5 (*) 12.0 - 15.0 g/dL   HCT 16.1 (*) 09.6 - 04.5 %   Ct Chest W Contrast  05/03/2012  *RADIOLOGY REPORT*  Clinical Data: Right paratracheal soft tissue prominence on the chest radiograph.  Evaluate for mass or lymphadenopathy.  CT CHEST WITH CONTRAST  Technique:  Multidetector CT imaging of the chest was performed following the standard protocol during bolus administration of intravenous contrast.  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  No mediastinal or hilar masses or lymphadenopathy identified.  No lymphadenopathy seen elsewhere within the thorax.  Mild scarring is seen in the lower lobes bilaterally.  No evidence of pulmonary infiltrate or mass.  No central endobronchial lesion identified.  IMPRESSION:  1.  No evidence of mass or lymphadenopathy. 2.  Bilateral lower lobe scarring.  No active disease.   Original Report Authenticated By: Myles Rosenthal, M.D.     Assessment/Plan: Diagnosis: left IT hip fx, s/p IMN 1. Does the  need for close, 24 hr/day medical supervision in concert with the patient's rehab needs make it unreasonable for this patient to be served in a less intensive setting? Yes 2. Co-Morbidities requiring supervision/potential complications: htn, ibs, pain 3. Due to bladder management, bowel management, safety, skin/wound care, disease management, medication administration, pain management and patient education, does the patient require 24 hr/day rehab nursing? Yes 4. Does the patient require coordinated care of a physician, rehab nurse, PT (1-2 hrs/day, 5 days/week) and OT (1-2 hrs/day, 5 days/week) to address physical and functional deficits in the context of the above medical diagnosis(es)? Yes Addressing deficits in the following areas: balance,  endurance, locomotion, strength, transferring, bowel/bladder control, bathing, dressing, feeding, grooming, toileting and psychosocial support 5. Can the patient actively participate in an intensive therapy program of at least 3 hrs of therapy per day at least 5 days per week? Yes 6. The potential for patient to make measurable gains while on inpatient rehab is excellent 7. Anticipated functional outcomes upon discharge from inpatient rehab are mod I with PT, mod I with OT, n/a with SLP. 8. Estimated rehab length of stay to reach the above functional goals is: one week 9. Does the patient have adequate social supports to accommodate these discharge functional goals? Yes 10. Anticipated D/C setting: Home 11. Anticipated post D/C treatments: HH therapy 12. Overall Rehab/Functional Prognosis: excellent  RECOMMENDATIONS: This patient's condition is appropriate for continued rehabilitative care in the following setting: CIR Patient has agreed to participate in recommended program. Yes Note that insurance prior authorization may be required for reimbursement for recommended care.  Comment:Rehab RN to follow up.   Ivory Broad, MD     05/05/2012

## 2012-05-05 NOTE — Progress Notes (Signed)
Physical Therapy Treatment Note   05/05/12 1213  PT Visit Information  Last PT Received On 05/05/12  Assistance Needed +1  PT Time Calculation  PT Start Time 1213  PT Stop Time 1239  PT Time Calculation (min) 26 min  Subjective Data  Subjective pt received up in chair " its not as bad as i thought."  Precautions  Precautions Fall  Restrictions  Weight Bearing Restrictions No  LLE Weight Bearing WBAT  Cognition  Overall Cognitive Status Appears within functional limits for tasks assessed/performed  Arousal/Alertness Awake/alert  Orientation Level Appears intact for tasks assessed  Behavior During Session [facility] for tasks performed  Bed Mobility  Bed Mobility Not assessed (pt received up in chair)  Transfers  Transfers Sit to Stand;Stand to Sit  Sit to Stand With upper extremity assist;From chair/3-in-1;4: Min guard  Stand to Sit 4: Min guard;With upper extremity assist;To chair/3-in-1  Details for Transfer Assistance guarded, v/c's for hand placement  Ambulation/Gait  Ambulation/Gait Assistance 4: Min guard  Ambulation Distance (Feet) 40 Feet  Assistive device Rolling walker  Ambulation/Gait Assistance Details v/c's to increased L hip/knee flexion, step-to gait pattern, increased bilat UE WBing  Gait velocity slow  Stairs No  General Exercises - Lower Extremity  Ankle Circles/Pumps AROM;Strengthening;Both;10 reps;Seated  Gluteal Sets AROM;Strengthening;10 reps;Seated  Hip ABduction/ADduction AROM;Left;10 reps;Standing  Long [employer] AROM;10 reps;Seated  PT - End of Session  Equipment Utilized During Treatment Gait belt  Activity Tolerance Patient tolerated treatment well  Patient left in chair;with call bell/phone within reach;with family/visitor present  Nurse Communication Mobility status  PT - Assessment/Plan  Comments on Treatment Session Pt progressing well, tolerating increased amb distance. 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 Patient remains to require assist for all adls and mobility. con't per plan.  PT Plan Discharge plan remains appropriate;Frequency remains appropriate  PT Frequency Min 5X/week  Follow Up Recommendations CIR;Supervision/Assistance - 24 hour  PT equipment Rolling walker with 5" wheels  Acute Rehab PT Goals  PT Goal: Sit to Stand - Progress Progressing toward goal  PT Goal: Stand to Sit - Progress Progressing toward goal  PT Goal: Ambulate - Progress Progressing toward goal  PT Goal: Perform Home Exercise Program - Progress Progressing toward goal  PT General Charges  $$ ACUTE PT VISIT 1 Procedure  PT Treatments  $Gait Training 8-22 mins  $Therapeutic Exercise 8-22 mins    Pain: 6/10 L hip  Lewis Shock, PT, DPT Pager #: [phone] Office #: [phone]

## 2012-05-05 NOTE — Progress Notes (Signed)
Met with patient at bedside to discuss CIR. Pt would benefit from inpatient rehab prior to returning home. 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 Pt is very eager to come to CIR so that she can be modified independent when she returns home. 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  For questions call [phone]

## 2012-05-05 NOTE — Discharge Summary (Addendum)
[facility]                                                                                   [name], is a 56 y.o. female  DOB 1955-05-26  MRN [id number].  Admission date:  05/02/2012  Discharge Date:  05/06/2012  Primary MD  Terressa Koyanagi., DO  Admitting Physician  Rhetta Mura, MD  Admission Diagnosis  Closed intertrochanteric fracture of left hip [820.21] Fall, Hip fracture, left  Left intertrochanteric hip fracture  Discharge Diagnosis     Principal Problem:  *Fracture, intertrochanteric, left femur Active Problems:  B12 DEFICIENCY  HYPERTENSION  HEMORRHOIDS  INFLAMMATORY BOWEL DISEASE  COLONIC POLYPS, HYPERPLASTIC, HX OF  H/O Turner syndrome      Past Medical History  Diagnosis Date  . Hypertension     off meds after wt loss  . Ulcerative colitis     hx  . Osteopenia   . GERD (gastroesophageal reflux disease)     hx-contrlled now  . Blood in stool   . Colon polyp   . IBD (inflammatory bowel disease)   . Hemorrhoids   . Diverticulosis   . Vitamin B12 deficiency   . Turner's syndrome     was on Provera and Premarin and d/c this  at age 72yr  . Fracture, intertrochanteric, left femur 05/02/2012    Past Surgical History  Procedure Date  . Hernia repair     lt ing   . Tonsillectomy   . Fracture surgery 2004    fx lt thumb  . Upper gastrointestinal endoscopy   . Colonoscopy   . Orif finger fracture 06/14/2011    Procedure: OPEN REDUCTION INTERNAL FIXATION (ORIF) METACARPAL (FINGER) FRACTURE;  Surgeon: Tami Ribas, MD;  Location: [facility];  Service: Orthopedics;  Laterality: Right;  right ring     Recommendations for primary care physician for things to follow:   Please monitor INR every 48 hours, check CBC, BMP, INR in 2 days, close followup with Dr. Dion Saucier.   Repeat Vit D Levels in 4 weeks    Discharge Diagnoses:   Principal Problem:  *Fracture, intertrochanteric, left femur Active  Problems:  B12 DEFICIENCY  HYPERTENSION  HEMORRHOIDS  INFLAMMATORY BOWEL DISEASE  COLONIC POLYPS, HYPERPLASTIC, HX OF  H/O Turner syndrome    Discharge Condition: Stable   Diet recommendation: See Discharge Instructions below   Consults Dr Dion Saucier   History of present illness and  Hospital Course:  See H&P, Labs, Consult and Test reports for all details in brief, patient was admitted for accidental fall at work was in left femoral fracture requiring open reduction and internal fixation by Dr. Dion Saucier on 05/02/2012, patient tolerated the procedure well, will need continued PT and assistance with walking, will benefit from continued rehabilitation. She has history of osteoporosis for which we request that outpatient endocrinology followup be considered, she has pending vitamin D. levels which will need to be followed up by primary care physician and M.D. Rehabilitation.  She was found to be Low on Vit D levels, supplement added, repeat level in 4 weeks. Mild post op bleed related anemia, stable, monitor CBC. INR closely.  For hypertension home dose ACE inhibitor will be continued, upon admission patient had a chest x-ray which suggested questionable pulmonary fullness followup CT scan was unremarkable, she has been placed on Coumadin for DVT prophylaxis or orthopedics will recommend close monitoring of INR q. 48 hours with goal INR being 2-3.    Please monitor INR every 48 hours, check CBC BMP in 3 days, close followup with Dr. Dion Saucier.        Today   Subjective:   [name] today has no headache,no chest abdominal pain,no new weakness tingling or numbness, feels much better wants to go home today.    Objective:   Blood pressure 110/56, pulse 64, temperature 97.7 F (36.5 C), temperature source Oral, resp. rate 20, height 4\' 9"  (1.448 m), weight 55 kg (121 lb 4.1 oz), SpO2 100.00%.   Intake/Output Summary (Last 24 hours) at 05/06/12 0923 Last data filed at 05/05/12  1354  Gross per 24 hour  Intake    360 ml  Output      0 ml  Net    360 ml    Exam Awake Alert, Oriented *3, No new F.N deficits, Normal affect [place].AT,PERRAL Supple Neck,No JVD, No cervical lymphadenopathy appriciated.  Symmetrical Chest wall movement, Good air movement bilaterally, CTAB RRR,No Gallops,Rubs or new Murmurs, No Parasternal Heave +ve B.Sounds, Abd Soft, Non tender, No organomegaly appriciated, No rebound -guarding or rigidity. No Cyanosis, Clubbing or edema, No new Rash or bruise  Data Review   Major procedures and Radiology Reports - PLEASE review detailed and final reports for all details in brief -     Dg Chest 1 View  05/02/2012  *RADIOLOGY REPORT*  Clinical Data: Preop left hip injury  CHEST - 1 VIEW  Comparison: None.  Findings: Increased interstitial markings, likely chronic.  No focal consolidation. No pleural effusion or pneumothorax.  The heart is top normal in size.  Prominence of the right paratracheal stripe, possibly vascular.  IMPRESSION: No evidence of acute cardiopulmonary disease.  Prominence of the right paratracheal study, possibly vascular. Correlate with prior imaging, if available.  Otherwise, consider nonemergent CT with contrast to exclude underlying adenopathy.   Original Report Authenticated By: Charline Bills, M.D.    Dg Femur Left  05/02/2012  *RADIOLOGY REPORT*  Clinical Data: Left femoral intramedullary nail  DG C-ARM 61-120 MIN,LEFT FEMUR - 2 VIEW  Technique: Intraoperative fluoroscopy of the left hip and knee with AP and lateral views obtained.  Comparison:  05/02/2012 at 0850 hours  Findings: Intraoperative fluoroscopy utilized for surgical control purposes of.  AP and lateral spot fluoroscopic views of the left hip and left knee are obtained.  There has been interval reduction of previously demonstrated intertrochanteric fractures with fixation using intramedullary rod and screws.  There is a distal locking screw.  Significant improvement  of the alignment and position of the fracture fragments.  No evidence of subluxation of the hip joint.  IMPRESSION: Intraoperative fluoroscopic views of the left hip and knee obtained for surgical control purposes. Interval reduction and internal fixation of left hip fractures   Original Report Authenticated By: Burman Nieves, M.D.    Dg Femur Left  05/02/2012  *RADIOLOGY REPORT*  Clinical Data: Fall, left hip pain  LEFT FEMUR - 2 VIEW  Comparison: None.  Findings: Comminuted intertrochanteric left hip fracture with at least three dominant fracture fragments.  Greater trochanteric fragment is mildly displaced.  Secondary varus angulation.  IMPRESSION: Comminuted intertrochanteric left hip fracture, as described above.   Original  Report Authenticated By: Charline Bills, M.D.    Ct Chest W Contrast  05/03/2012  *RADIOLOGY REPORT*  Clinical Data: Right paratracheal soft tissue prominence on the chest radiograph.  Evaluate for mass or lymphadenopathy.  CT CHEST WITH CONTRAST  Technique:  Multidetector CT imaging of the chest was performed following the standard protocol during bolus administration of intravenous contrast.  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  No mediastinal or hilar masses or lymphadenopathy identified.  No lymphadenopathy seen elsewhere within the thorax.  Mild scarring is seen in the lower lobes bilaterally.  No evidence of pulmonary infiltrate or mass.  No central endobronchial lesion identified.  IMPRESSION:  1.  No evidence of mass or lymphadenopathy. 2.  Bilateral lower lobe scarring.  No active disease.   Original Report Authenticated By: Myles Rosenthal, M.D.    Dg Pelvis Portable  05/02/2012  *RADIOLOGY REPORT*  Clinical Data: Postop left femoral intramedullary nail.  PORTABLE PELVIS  Comparison: Earlier same date  Findings: Examination  demonstrates a left femoral intramedullary nail with associated screw bridging the femoral neck into the femoral head with hardware intact.  Mild residual lucency is seen over patient's left hip fracture in the basicervical region and lesser trochanter. The lesser trochanter is slightly more superiorly positioned than normally seen.  There is anatomic alignment about the fracture site.  There are mild symmetric degenerative changes of the hips.  IMPRESSION: Fixation of patient's left hip fracture with hardware intact and near anatomic alignment about the fracture site.  The lesser trochanter is slightly more superiorly located than normally seen.  Mild symmetric degenerative changes of the hips.   Original Report Authenticated By: Elberta Fortis, M.D.    Dg Hip Portable 1 View Left  05/02/2012  *RADIOLOGY REPORT*  Clinical Data: Postop left femoral intramedullary nail.  PORTABLE LEFT HIP - 1 VIEW  Comparison: Earlier same date  Findings: Cross-table lateral image demonstrates an intramedullary nail and associated screw through the femoral neck into the femoral head fixating the patient's intertrochanteric fracture.  Hardware appears intact as there is normal alignment about the fracture site.  IMPRESSION: Cross-table lateral image demonstrating intact left femoral intramedullary nail and associated screw bridging patient's intertrochanteric fracture with hardware intact and normal alignment about the fracture site.   Original Report Authenticated By: Elberta Fortis, M.D.    Dg C-arm 61-120 Min  05/02/2012  *RADIOLOGY REPORT*  Clinical Data: Left femoral intramedullary nail  DG C-ARM 61-120 MIN,LEFT FEMUR - 2 VIEW  Technique: Intraoperative fluoroscopy of the left hip and knee with AP and lateral views obtained.  Comparison:  05/02/2012 at 0850 hours  Findings: Intraoperative fluoroscopy utilized for surgical control purposes of.  AP and lateral spot fluoroscopic views of the left hip and left knee are obtained.   There has been interval reduction of previously demonstrated intertrochanteric fractures with fixation using intramedullary rod and screws.  There is a distal locking screw.  Significant improvement of the alignment and position of the fracture fragments.  No evidence of subluxation of the hip joint.  IMPRESSION: Intraoperative fluoroscopic views of the left hip and knee obtained for surgical control purposes. Interval reduction and internal fixation of left hip fractures   Original Report Authenticated By: Burman Nieves, M.D.     Micro Results    CBC w Diff: Lab Results  Component Value Date   WBC 10.9* 05/03/2012  HGB 8.9* 05/06/2012   HCT 25.7* 05/06/2012   PLT 193 05/03/2012   LYMPHOPCT 30 05/02/2012   MONOPCT 6 05/02/2012   EOSPCT 2 05/02/2012   BASOPCT 0 05/02/2012    CMP: Lab Results  Component Value Date   NA 133* 05/03/2012   K 3.9 05/03/2012   CL 98 05/03/2012   CO2 26 05/03/2012   BUN 8 05/03/2012   CREATININE 0.54 05/03/2012  .   Discharge Instructions     Follow with Primary MD Kriste Basque R., DO in 7 days , keep your hip incision site clean and dry at all times  Get CBC, CMP, INR checked 2 days by Primary MD or rehabilitation M.D. and again as instructed by your Primary MD.   Get Medicines reviewed and adjusted.  Please request your Prim.MD to go over all Hospital Tests and Procedure/Radiological results at the follow up, please get all Hospital records sent to your Prim MD by signing hospital release before you go home.  Activity: Weight bearing on as tolerated with Full fall precautions use walker/cane & assistance as needed   Diet:  Heart Healthy  For Heart failure patients - Check your Weight same time everyday, if you gain over 2 pounds, or you develop in leg swelling, experience more shortness of breath or chest pain, call your Primary MD immediately. Follow Cardiac Low Salt Diet and 1.8 lit/day fluid restriction.  Disposition rehabilitation  If  you experience worsening of your admission symptoms, develop shortness of breath, life threatening emergency, suicidal or homicidal thoughts you must seek medical attention immediately by calling 911 or calling your MD immediately  if symptoms less severe.  You Must read complete instructions/literature along with all the possible adverse reactions/side effects for all the Medicines you take and that have been prescribed to you. Take any new Medicines after you have completely understood and accpet all the possible adverse reactions/side effects.   Do not drive and provide baby sitting services if your were admitted for syncope or siezures until you have seen by Primary MD or a Neurologist and advised to do so again.  Do not drive when taking Pain medications.    Do not take more than prescribed Pain, Sleep and Anxiety Medications  Special Instructions: If you have smoked or chewed Tobacco  in the last 2 yrs please stop smoking, stop any regular Alcohol  and or any Recreational drug use.  Wear Seat belts while driving.  Follow-up Information    Follow up with LANDAU,JOSHUA P, MD. In 2 weeks.   Contact information:   [address] [place] [address] [phone]       Follow up with Kriste Basque R., DO. Schedule an appointment as soon as possible for a visit in 1 week.   Contact information:   [address] [place] [address] [phone]            Discharge Medications     Medication List     As of 05/06/2012  9:23 AM    START taking these medications         HYDROcodone-acetaminophen 10-325 MG per tablet   Commonly known as: NORCO   Take 1-2 tablets by mouth every 6 (six) hours as needed for pain.      Vitamin D (Ergocalciferol) 50000 UNITS Caps   Commonly known as: DRISDOL   Take 1 capsule (50,000 Units total) by mouth every 7 (seven) days.      warfarin 2 MG  tablet   Commonly known as: COUMADIN   Take 1 tablet (2 mg total) by  mouth daily. Must get INR checked every other day for the next 2 weeks, goal of INR is 2-3, hold Coumadin if INR is greater than 3.2.      CONTINUE taking these medications         lisinopril 10 MG tablet   Commonly known as: PRINIVIL,ZESTRIL   Take 1 tablet (10 mg total) by mouth daily.      multivitamin tablet      risedronate 150 MG tablet   Commonly known as: ACTONEL      STOP taking these medications         VITAMIN D (CHOLECALCIFEROL) PO          Where to get your medications    These are the prescriptions that you need to pick up.   You may get these medications from any pharmacy.         HYDROcodone-acetaminophen 10-325 MG per tablet   warfarin 2 MG tablet         Information on where to get these meds is not yet available. Ask your nurse or doctor.         Vitamin D (Ergocalciferol) 50000 UNITS Caps               Total Time in preparing paper work, data evaluation and todays exam - 35 minutes  Leroy Sea M.D on 05/06/2012 at 9:23 AM  [facility] Office  [phone]

## 2012-05-06 ENCOUNTER — Encounter (HOSPITAL_COMMUNITY): Payer: Self-pay | Admitting: Orthopedic Surgery

## 2012-05-06 ENCOUNTER — Inpatient Hospital Stay (HOSPITAL_COMMUNITY)
Admission: RE | Admit: 2012-05-06 | Discharge: 2012-05-11 | DRG: 946 | Disposition: A | Discharge status: Home Health | Payer: PRIVATE HEALTH INSURANCE | Source: Ambulatory Visit | Attending: Physical Medicine & Rehabilitation | Admitting: Physical Medicine & Rehabilitation

## 2012-05-06 DIAGNOSIS — Z79899 Other long term (current) drug therapy: Secondary | ICD-10-CM

## 2012-05-06 DIAGNOSIS — M899 Disorder of bone, unspecified: Secondary | ICD-10-CM

## 2012-05-06 DIAGNOSIS — Y9210 Unspecified residential institution as the place of occurrence of the external cause: Secondary | ICD-10-CM

## 2012-05-06 DIAGNOSIS — R296 Repeated falls: Secondary | ICD-10-CM

## 2012-05-06 DIAGNOSIS — Z9889 Other specified postprocedural states: Secondary | ICD-10-CM

## 2012-05-06 DIAGNOSIS — I10 Essential (primary) hypertension: Secondary | ICD-10-CM

## 2012-05-06 DIAGNOSIS — R6889 Other general symptoms and signs: Secondary | ICD-10-CM

## 2012-05-06 DIAGNOSIS — D649 Anemia, unspecified: Secondary | ICD-10-CM

## 2012-05-06 DIAGNOSIS — Y990 Civilian activity done for income or pay: Secondary | ICD-10-CM

## 2012-05-06 DIAGNOSIS — S72143A Displaced intertrochanteric fracture of unspecified femur, initial encounter for closed fracture: Secondary | ICD-10-CM

## 2012-05-06 DIAGNOSIS — E559 Vitamin D deficiency, unspecified: Secondary | ICD-10-CM

## 2012-05-06 DIAGNOSIS — Z5189 Encounter for other specified aftercare: Principal | ICD-10-CM

## 2012-05-06 LAB — HEMOGLOBIN AND HEMATOCRIT, BLOOD: HCT: 25.7 % — ABNORMAL LOW (ref 36.0–46.0)

## 2012-05-06 LAB — PROTIME-INR
INR: 2.04 — ABNORMAL HIGH (ref 0.00–1.49)
Prothrombin Time: 22.2 seconds — ABNORMAL HIGH (ref 11.6–15.2)

## 2012-05-06 MED ORDER — ACETAMINOPHEN 325 MG PO TABS: 325.0000 mg | ORAL_TABLET | ORAL | Status: DC | PRN

## 2012-05-06 MED ORDER — ONDANSETRON HCL 4 MG/2ML IJ SOLN: 4.0000 mg | Freq: Four times a day (QID) | INTRAMUSCULAR | Status: DC | PRN

## 2012-05-06 MED ORDER — VITAMIN D (ERGOCALCIFEROL) 1.25 MG (50000 UNIT) PO CAPS: 50000.0000 [IU] | ORAL_CAPSULE | ORAL | Status: DC

## 2012-05-06 MED ORDER — WARFARIN SODIUM 2 MG PO TABS
2.0000 mg | ORAL_TABLET | Freq: Every day | ORAL | Status: DC
  Administered 2012-05-06: 2 mg via ORAL
  Filled 2012-05-06 (×2): qty 1

## 2012-05-06 MED ORDER — VITAMIN D (ERGOCALCIFEROL) 1.25 MG (50000 UNIT) PO CAPS
50000.0000 [IU] | ORAL_CAPSULE | ORAL | Status: DC
  Administered 2012-05-06: 50000 [IU] via ORAL
  Filled 2012-05-06: qty 1

## 2012-05-06 MED ORDER — WARFARIN SODIUM 1 MG PO TABS: 1.0000 mg | ORAL_TABLET | Freq: Once | ORAL | Status: DC

## 2012-05-06 MED ORDER — SORBITOL 70 % SOLN: 30.0000 mL | Freq: Every day | Status: DC | PRN

## 2012-05-06 MED ORDER — WARFARIN SODIUM 2 MG PO TABS
2.0000 mg | ORAL_TABLET | Freq: Every day | ORAL | Status: DC
  Filled 2012-05-06: qty 1

## 2012-05-06 MED ORDER — HYDROCODONE-ACETAMINOPHEN 5-325 MG PO TABS: 1.0000 | ORAL_TABLET | Freq: Four times a day (QID) | ORAL | Status: DC | PRN

## 2012-05-06 MED ORDER — HYDROCODONE-ACETAMINOPHEN 5-325 MG PO TABS
1.0000 | ORAL_TABLET | ORAL | Status: DC | PRN
  Administered 2012-05-06 – 2012-05-11 (×25): 2 via ORAL
  Filled 2012-05-06 (×26): qty 2

## 2012-05-06 MED ORDER — WARFARIN - PHARMACIST DOSING INPATIENT: Freq: Every day | Status: DC

## 2012-05-06 MED ORDER — LISINOPRIL 5 MG PO TABS
5.0000 mg | ORAL_TABLET | Freq: Every day | ORAL | Status: DC
  Administered 2012-05-06: 5 mg via ORAL
  Filled 2012-05-06: qty 1

## 2012-05-06 MED ORDER — ALUM & MAG HYDROXIDE-SIMETH 200-200-20 MG/5ML PO SUSP: 30.0000 mL | ORAL | Status: DC | PRN

## 2012-05-06 MED ORDER — METHOCARBAMOL 500 MG PO TABS
500.0000 mg | ORAL_TABLET | Freq: Four times a day (QID) | ORAL | Status: DC | PRN
  Administered 2012-05-06 – 2012-05-11 (×11): 500 mg via ORAL
  Filled 2012-05-06 (×11): qty 1

## 2012-05-06 MED ORDER — POLYETHYLENE GLYCOL 3350 17 G PO PACK
17.0000 g | PACK | Freq: Every day | ORAL | Status: DC | PRN
  Filled 2012-05-06: qty 1

## 2012-05-06 MED ORDER — LISINOPRIL 5 MG PO TABS
5.0000 mg | ORAL_TABLET | Freq: Every day | ORAL | Status: DC
  Administered 2012-05-07 – 2012-05-10 (×4): 5 mg via ORAL
  Filled 2012-05-06 (×6): qty 1

## 2012-05-06 MED ORDER — ONDANSETRON HCL 4 MG PO TABS: 4.0000 mg | ORAL_TABLET | Freq: Four times a day (QID) | ORAL | Status: DC | PRN

## 2012-05-06 NOTE — Progress Notes (Signed)
Admitting pt to CIR today. I have received notice from Gertie Baron, [facility] [employer] Rad[employer] protection practitioner that pt's CIR admission is approved. Pt is in agreement and eager to come to rehab. For questions, please call [phone].

## 2012-05-06 NOTE — PMR Pre-admission (Signed)
PMR Admission Coordinator Pre-Admission Assessment  Patient: [name] is an 56 y.o., female MRN: [id number] DOB: [birth year] Height: 4\' 9"  (144.8 cm) Weight: 55 kg (121 lb 4.1 oz)              Insurance Information PRIMARY:Workers Comp      Policy#:Temporary #[id number]     Subscriber: self CM Name: Jaclyn Prime      Phone#: [phone]     Fax#: Pre-Cert#: Temporary [id number]     Employer:[employer] Benefits:  Phone #: .      Name:  Eff. Date:    Deduct:       Out of Pocket Max:       Life Max:  CIR:       SNF:  Outpatient:      Co-Pay:  Home Health:       Co-Pay:  DME:      Co-Pay:  Providers:   Emergency Contact Information Contact Information    Name Relation Home Work Mobile   [place] Daughter   [phone]     Current Medical History  Patient Admitting Diagnosis:Left IT hip fx, s/p IMN  History of Present Illness:56 y.o. right-handed female who is an employee at [employer] [facility] admitted 05/02/2012 after trying to load a very heavy pallet of food when she fell backwards landing on her left hip without loss of consciousness. X-rays and imaging revealed left intertrochanteric hip fracture. Underwent intramedullary nail 05/02/2012 per Dr. Dion Saucier. Advised weightbearing as tolerated. Placed on Coumadin for DVT prophylaxis. Postoperative anemia 10.3 and monitored. Pain management with the use of Vicodin.     Past Medical History  Past Medical History  Diagnosis Date  . Hypertension     off meds after wt loss  . Ulcerative colitis     hx  . Osteopenia   . GERD (gastroesophageal reflux disease)     hx-contrlled now  . Blood in stool   . Colon polyp   . IBD (inflammatory bowel disease)   . Hemorrhoids   . Diverticulosis   . Vitamin B12 deficiency   . Turner's syndrome     was on Provera and Premarin and d/c this  at age 23yr  . Fracture, intertrochanteric, left femur 05/02/2012   Family History  family history includes Lymphoma in her father and  mother.  Prior Rehab/Hospitalizations: none  Current Medications  Current facility-administered medications:acetaminophen (TYLENOL) tablet 650 mg, 650 mg, Oral, Q6H PRN, Eulas Post, MD;  alum & mag hydroxide-simeth (MAALOX/MYLANTA) 200-200-20 MG/5ML suspension 30 mL, 30 mL, Oral, Q4H PRN, Eulas Post, MD;  diazepam (VALIUM) tablet 2 mg, 2 mg, Oral, Q8H PRN, Rhetta Mura, MD, 2 mg at 05/02/12 1103 docusate sodium (COLACE) capsule 100 mg, 100 mg, Oral, BID, Eulas Post, MD, 100 mg at 05/05/12 2152;  HYDROcodone-acetaminophen (NORCO/VICODIN) 5-325 MG per tablet 1-2 tablet, 1-2 tablet, Oral, Q6H PRN, Eulas Post, MD, 2 tablet at 05/06/12 0525;  lisinopril (PRINIVIL,ZESTRIL) tablet 5 mg, 5 mg, Oral, Daily, Leroy Sea, MD;  methocarbamol (ROBAXIN) tablet 500 mg, 500 mg, Oral, Q6H PRN, Eulas Post, MD, 500 mg at 05/06/12 0341 ondansetron (ZOFRAN) injection 4 mg, 4 mg, Intravenous, Q6H PRN, Eulas Post, MD, 4 mg at 05/03/12 1959;  polyethylene glycol (MIRALAX / GLYCOLAX) packet 17 g, 17 g, Oral, Daily PRN, Eulas Post, MD;  sorbitol 70 % solution 30 mL, 30 mL, Oral, Daily PRN, Eulas Post, MD;  Vitamin D (Ergocalciferol) (DRISDOL) capsule 50,000 Units,  50,000 Units, Oral, Q7 days, Leroy Sea, MD Warfarin - Pharmacist Dosing Inpatient, , Does not apply, q1800, Rhetta Mura, MD;  zolpidem (AMBIEN) tablet 5 mg, 5 mg, Oral, QHS PRN,MR X 1, Eulas Post, MD  Patients Current Diet: General  Precautions / Restrictions Precautions Precautions: Fall Restrictions Weight Bearing Restrictions: No LLE Weight Bearing: Weight bearing as tolerated   Prior Activity Level Community (5-7x/wk): Active daily Home Assistive Devices / Equipment Home Assistive Devices/Equipment: Eyeglasses Home Adaptive Equipment: None  Prior Functional Level Prior Function Level of Independence: Independent Able to Take Stairs?: Yes Driving: Yes Vocation: Full time  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  Current Functional Level Cognition  Arousal/Alertness: Awake/alert Overall Cognitive Status: Appears within functional limits for tasks assessed/performed Orientation Level: Oriented X4    Extremity Assessment (includes Sensation/Coordination)  RUE ROM/Strength/Tone: Within functional levels RUE Sensation: WFL - Light Touch RUE Coordination: WFL - gross/fine motor  RLE ROM/Strength/Tone: Within functional levels RLE Sensation: WFL - Light Touch    ADLs  Eating/Feeding: Simulated;Independent Where Assessed - Eating/Feeding: Chair Grooming: Performed;Wash/dry face;Minimal assistance Where Assessed - Grooming: Supported standing Upper Body Bathing: Simulated;Set up Where Assessed - Upper Body Bathing: Unsupported sitting Lower Body Bathing: Simulated;Minimal assistance Where Assessed - Lower Body Bathing: Supported sit to stand Upper Body Dressing: Simulated;Set up Where Assessed - Upper Body Dressing: Unsupported sitting Lower Body Dressing: Simulated;Moderate assistance Where Assessed - Lower Body Dressing: Unsupported sit to stand Toilet Transfer: Simulated;Minimal assistance Toilet Transfer Method: Other (comment) (ambulate with the RW) Toilet Transfer Equipment: Comfort height toilet;Grab bars Toileting - Clothing Manipulation and Hygiene: Simulated;Minimal assistance Where Assessed - Toileting Clothing Manipulation and Hygiene: Sit to stand from 3-in-1 or toilet Tub/Shower Transfer Method: Not assessed Equipment Used: Rolling walker Transfers/Ambulation Related to ADLs: Pt is overall min guard assist for short distance ambulation in the room with her RW.  Needs mod instructional cueing to sequence walker and stepping.   ADL Comments: Demonstrates decreased ability to perform LB selfcare tasks secondary to not being able to tolerate reaching the LLE.  Began education on AE for LB dressing tasks.  Pt needs to be modified independent  level in order to discharge home with husband who cannot provide any physical assist.    Mobility  Bed Mobility: Not assessed (pt received up in chair) Supine to Sit: 1: +2 Total assist Supine to Sit: Patient Percentage: 50% Sitting - Scoot to Edge of Bed: 3: Mod assist    Transfers  Transfers: Sit to Stand;Stand to Sit Sit to Stand: 4: Min guard;With upper extremity assist Stand to Sit: 4: Min guard;Without upper extremity assist Stand Pivot Transfers: 3: Mod assist    Ambulation / Gait / Stairs / Wheelchair Mobility  Ambulation/Gait Ambulation/Gait Assistance: 4: Min Government social research officer (Feet): 40 Feet Assistive device: Rolling walker Ambulation/Gait Assistance Details: v/c's to increased L hip/knee flexion, step-to gait pattern, increased bilat UE WBing Gait Pattern: Decreased stance time - right;Decreased step length - left;Decreased hip/knee flexion - left;Decreased weight shift to left;Narrow base of support Gait velocity: slow Stairs: No    Posture / Balance Dynamic Standing Balance Dynamic Standing - Balance Support: Right upper extremity supported;Left upper extremity supported Dynamic Standing - Level of Assistance: 4: Min assist    Special needs/care consideration BiPAP/CPAP -n/a CPM -n/a Continuous Drip IV -n/a Dialysis -n/a       Life Vest -n/a Oxygen -n/a Special Bed -n/a Trach Size -n/a Wound Vac (area) -n/a     Skin: L  lateral hip incision                               Bowel mgmt: LBM 12/16 Bladder mgmt: WNL Diabetic mgmt - n/a   Previous Home Environment Living Arrangements: Spouse/significant other Lives With: Spouse Available Help at Discharge: Family;Other (Comment) (husband unable to help physically; daughters work) Type of Home: House Home Layout: One level Home Access: Stairs to enter Entrance Stairs-Rails: None Entrance Stairs-Number of Steps: 1 Bathroom Shower/Tub: Psychologist, counselling;Door [employer] Toilet: Standard Bathroom Accessibility:  Yes How Accessible: Accessible via walker Home Care Services: No  Discharge Living Setting Plans for Discharge Living Setting: Patient's home Type of Home at Discharge: House Discharge Home Layout: One level Discharge Home Access: Stairs to enter Entrance Stairs-Rails: None Entrance Stairs-Number of Steps: 2 Discharge Bathroom Shower/Tub: Walk-in shower Discharge Bathroom Toilet: Standard Discharge Bathroom Accessibility: Yes How Accessible: Accessible via walker Do you have any problems obtaining your medications?: No  Social/Family/Support Systems Patient Roles: Spouse;Parent;Caregiver for spouse & pt has 2 grandchildren who she cares for at times. Contact Information: [phone] Anticipated Caregiver: Pt has a daughter who works f/t and unable to assist. 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 He goes to adult daycare while she works. Pt works f/t at [employer].  Goals/Additional Needs Patient/Family Goal for Rehab: Modified independent Expected length of stay: 1 week Cultural Considerations: none Dietary Needs: Regular Equipment Needs: TBD Pt/Family Agrees to Admission and willing to participate: Yes Program Orientation Provided & Reviewed with Pt/Caregiver Including Roles  & Responsibilities: Yes Additional Information Needs: Pt's daughter is caring for pt's husband while she is in hospital.  Decrease burden of Care through IP rehab admission: N/A  Possible need for SNF placement upon discharge: No  Patient Condition: This patient's condition remains as documented in the Consult dated 05/05/12, in which the Rehabilitation Physician determined and documented that the patient's condition is appropriate for intensive rehabilitative care in an inpatient rehabilitation facility.  Preadmission Screen Completed By:  Meryl Dare, 05/06/2012 8:56 AM ______________________________________________________________________   Discussed status with Dr. Riley Kill on 05/06/12 at 11:30 AM and  received telephone approval for admission today.  Admission Coordinator:  Meryl Dare, time11:48 AM Dorna Bloom 05/06/12

## 2012-05-06 NOTE — H&P (Signed)
Physical Medicine and Rehabilitation Admission H&P  Chief Complaint   Patient presents with   .  Fall   :  HPI: [name] is a 56 y.o. right-handed female who is an employee at [employer] [facility] admitted 05/02/2012 after trying to load a very heavy pallet of food when she fell backwards landing on her left hip without loss of consciousness. X-rays and imaging revealed left intertrochanteric hip fracture. Underwent intramedullary nail 05/02/2012 per Dr. Dion Saucier. Advised weightbearing as tolerated. Placed on Coumadin for DVT prophylaxis. Postoperative anemia 9.6 and monitored. Pain management with the use of Vicodin. Physical therapy evaluation completed an ongoing with recommendations of physical medicine rehabilitation consult to consider inpatient rehabilitation services. Patient was felt to be a good candidate for inpatient rehabilitation services and was admitted for a comprehensive rehabilitation program  Review of Systems  Gastrointestinal: Positive for heartburn.  Musculoskeletal: Positive for myalgias.  All other systems reviewed and are negative  Past Medical History   Diagnosis  Date   .  Hypertension      off meds after wt loss   .  Ulcerative colitis      hx   .  Osteopenia    .  GERD (gastroesophageal reflux disease)      hx-contrlled now   .  Blood in stool    .  Colon polyp    .  IBD (inflammatory bowel disease)    .  Hemorrhoids    .  Diverticulosis    .  Vitamin B12 deficiency    .  Turner's syndrome      was on Provera and Premarin and d/c this at age 68yr   .  Fracture, intertrochanteric, left femur  05/02/2012    Past Surgical History   Procedure  Date   .  Hernia repair      lt ing   .  Tonsillectomy    .  Fracture surgery  2004     fx lt thumb   .  Upper gastrointestinal endoscopy    .  Colonoscopy    .  Orif finger fracture  06/14/2011     Procedure: OPEN REDUCTION INTERNAL FIXATION (ORIF) METACARPAL (FINGER) FRACTURE; Surgeon: Tami Ribas, MD;  Location: [facility]; Service: Orthopedics; Laterality: Right; right ring    Family History   Problem  Relation  Age of Onset   .  Lymphoma  Mother       nhl-mantle cell    .  Lymphoma  Father       nhl    Social History: reports that she has never smoked. She has never used smokeless tobacco. She reports that she drinks alcohol. She reports that she does not use illicit drugs.  Allergies: No Known Allergies  Medications Prior to Admission   Medication  Sig  Dispense  Refill   .  lisinopril (PRINIVIL,ZESTRIL) 10 MG tablet  Take 1 tablet (10 mg total) by mouth daily.  90 tablet  3   .  Multiple Vitamin (MULTIVITAMIN) tablet  Take 1 tablet by mouth daily.     .  risedronate (ACTONEL) 150 MG tablet  Take 150 mg by mouth every 30 (thirty) days. with water on empty stomach, nothing by mouth or lie down for next 30 minutes.     Marland Kitchen  VITAMIN D, CHOLECALCIFEROL, PO  Take by mouth.      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  Functional Status:  Mobility:  Bed Mobility  Bed Mobility: Not assessed  Supine to Sit: 1: +2 Total assist  Supine to Sit: Patient Percentage: 50%  Sitting - Scoot to Edge of Bed: 3: Mod assist  Transfers  Transfers: Sit to Stand;Stand to Sit  Sit to Stand: 4: Min assist;With upper extremity assist;From chair/3-in-1  Stand to Sit: 4: Min assist;With upper extremity assist;To chair/3-in-1   Stand Pivot Transfers: 3: Mod assist  Ambulation/Gait  Ambulation/Gait Assistance: 4: Min assist  Ambulation Distance (Feet): 30 Feet  Assistive device: Rolling walker  Ambulation/Gait Assistance Details: VC for increased hip and knee flexion, pt with stiff leg. Increased step length with LLE with distance. 1 sitting rest break needed secondary to fatigue  Gait Pattern: Decreased stance time - right;Decreased step length - left;Decreased hip/knee flexion - left;Decreased weight shift to left;Narrow base of support   ADL:   Cognition:  Cognition  Arousal/Alertness: Awake/alert  Orientation Level: Oriented X4  Cognition  Overall Cognitive Status: Appears within functional limits for tasks assessed/performed  Arousal/Alertness: Awake/alert  Orientation Level: Appears intact for tasks assessed  Behavior During Session: [facility] for tasks performed  Blood pressure 118/65, pulse 84, temperature 98.1 F (36.7 C), temperature source Oral, resp. rate 16, height 4\' 9"  (1.448 m), weight 55 kg (121 lb 4.1 oz), SpO2 100.00%.  Physical Exam  Vitals reviewed.  Constitutional: She is oriented to person, place, and time. She appears well-developed.  HENT:  Head: Normocephalic.  Eyes:  Pupils round and reactive to light  Neck: Normal range of motion. Neck supple. No thyromegaly present.  Cardiovascular: Normal rate and regular rhythm.  Pulmonary/Chest: Breath sounds normal. She has no wheezes.  Abdominal: Soft. Bowel sounds are normal. She exhibits no distension.  Musculoskeletal: She exhibits no edema.  Left hip tender  Neurological: She is alert and oriented to person, place, and time.  Follows full commands. Moves all 4's. UE 4+. RLE is 4/5 LLE is 1-2 prox to 4/5 distally. . No sensory deficits  Skin:  Left hip incision clean and dry  Psychiatric: She has a normal mood and affect. Her behavior is normal. Judgment and thought content normal.  Results for orders placed during the hospital  encounter of 05/02/12 (from the past 48 hour(s))   PROTIME-INR Status: Abnormal    Collection Time    05/04/12 7:30 AM   Component  Value  Range  Comment    Prothrombin Time  19.5 (*)  11.6 - 15.2 seconds     INR  1.71 (*)  0.00 - 1.49    HEMOGLOBIN AND HEMATOCRIT, BLOOD Status: Abnormal    Collection Time    05/04/12 7:30 AM   Component  Value  Range  Comment    Hemoglobin  10.5 (*)  12.0 - 15.0 g/dL     HCT  16.1 (*)  09.6 - 46.0 %    PROTIME-INR Status: Abnormal    Collection Time    05/05/12 7:09 AM   Component  Value  Range  Comment    Prothrombin Time  25.0 (*)  11.6 - 15.2 seconds     INR  2.39 (*)  0.00 - 1.49    HEMOGLOBIN AND HEMATOCRIT, BLOOD Status: Abnormal    Collection Time    05/05/12 7:09 AM   Component  Value  Range  Comment    Hemoglobin  9.6 (*)  12.0 - 15.0 g/dL     HCT  40.9 (*)  81.1 - 46.0 %     Ct Chest W Contrast  05/03/2012 *RADIOLOGY REPORT* Clinical Data: Right paratracheal soft tissue prominence on the chest radiograph. Evaluate for mass or lymphadenopathy. CT CHEST WITH CONTRAST Technique: Multidetector CT imaging of the chest was performed following the standard protocol during bolus administration of intravenous contrast. 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 No mediastinal or hilar masses or lymphadenopathy identified. No lymphadenopathy seen elsewhere within the thorax. Mild scarring is seen in the lower lobes bilaterally. No evidence of pulmonary infiltrate or mass. No central endobronchial lesion identified. IMPRESSION: 1. No evidence of mass or lymphadenopathy. 2. Bilateral lower lobe scarring. No active disease. Original Report Authenticated By: Myles Rosenthal, M.D.   Post Admission Physician Evaluation:  1. Functional deficits secondary to left IT hip fx. 2. Patient is admitted to receive collaborative,  interdisciplinary care between the physiatrist, rehab nursing staff, and therapy team. 3. Patient's level of medical complexity and substantial therapy needs in context of that medical necessity cannot be provided at a lesser intensity of care such as a SNF. 4. Patient has experienced substantial functional loss from his/her baseline which was documented above under the "Functional History" and "Functional Status" headings. Judging by the patient's diagnosis, physical exam, and functional history, the patient has potential for functional progress which will result in measurable gains while on inpatient rehab. These gains will be of substantial and practical use upon discharge in facilitating mobility and self-care at the household level. 5. Physiatrist will provide 24 hour management of medical needs as well as oversight of the therapy plan/treatment and provide guidance as appropriate regarding the interaction of the two. 6. 24 hour rehab nursing will assist with bladder management, bowel management, safety, skin/wound care, disease management, medication administration, pain management and patient education and help integrate therapy concepts, techniques,education, etc. 7. PT will assess and treat for: Lower extremity strength, range of motion, stamina, balance, functional mobility, safety, adaptive techniques and equipment, pain mgt. Goals are: mod I. 8. OT will assess and treat for: ADL's, functional mobility, safety, upper extremity strength, adaptive techniques and equipment, pain mgt, . Goals are: mod I. 9. SLP will assess and treat for: n/a. Goals are: n./a. 10. Case Management and Social Worker will assess and treat for psychological issues and discharge planning. 11. Team conference will be held weekly to assess progress toward goals and to determine barriers to discharge. 12. Patient will receive at least 3 hours of therapy per day at least 5 days per week. 13. ELOS: one week  Prognosis:  excellent Medical Problem List and Plan:  1. Left intertrochanteric hip fracture. Status post intramedullary nail 05/02/2012  2. DVT Prophylaxis/Anticoagulation: Coumadin for DVT prophylaxis. Monitor for any bleeding episodes  3. Pain Management: Vicodin and Robaxin as needed. Monitor the increased mobility  4. Neuropsych: This patient is capable of making decisions on his/her own behalf.  5. Postoperative anemia. Latest hemoglobin 9.6. Followup CBC  6. Hypertension.  Lisinopril 10 mg daily. Monitor with increased activity  05/05/2012   Ivory Broad, MD

## 2012-05-06 NOTE — Progress Notes (Signed)
Pt transferred to 4039 in stable condition after report called to RN.  Hector Shade [place]

## 2012-05-06 NOTE — Plan of Care (Signed)
Overall Plan of Care [facility]) Patient Details Name: [name] MRN: [id number] DOB: 16-Oct-1955  Diagnosis:  Left IT hip fx  Primary Diagnosis:    Hip fracture Co-morbidities: htn, GERD  Functional Problem List  Patient demonstrates impairments in the following areas: Balance, Bowel, Edema, Endurance, Medication Management, Motor and Pain  Basic ADL's: grooming, bathing, dressing and toileting Advanced ADL's: simple meal preparation  Transfers:  bed mobility, bed to chair, toilet, tub/shower, car and furniture Locomotion:  ambulation and stairs  Additional Impairments:  Leisure Awareness and Discharge Disposition  Anticipated Outcomes Item Anticipated Outcome  Eating/Swallowing  I  Basic self-care  Mod I  Tolieting  Mod I  Bowel/Bladder  bm q2days with scheduled meds, continent bowel/bladder   Transfers  Mod I  Locomotion  Mod I  Communication    Cognition    Pain  3 or less 1-10 scale   Safety/Judgment    Other  Skin: min assist with incision care/management. No new breakdown.   Therapy Plan:   OT Intensity: Minimum of 1-2 x/day, 45 to 90 minutes OT Duration/Estimated Length of Stay: 3-5 days OT Treatment/Interventions: Balance/vestibular training;Community reintegration;Discharge planning;Cognitive remediation/compensation;DME/adaptive equipment instruction;Disease mangement/prevention;Functional mobility training;Patient/family education;Neuromuscular re-education;Self Care/advanced ADL retraining;Pain management;Therapeutic Activities;Therapeutic Exercise;UE/LE Coordination activities;UE/LE Strength taining/ROM      Therapy Recommendations: PT @([phone]::1)@ OT @([id number]::8)@ SLP  @([phone]::5)@  Team Interventions: Item RN PT OT SLP SW TR Other  Self Care/Advanced ADL Retraining   x      Neuromuscular Re-Education  x x      Therapeutic Activities  x x      UE/LE Strength Training/ROM  x x      UE/LE Coordination Activities  x x       Visual/Perceptual Remediation/Compensation         DME/Adaptive Equipment Instruction  x x      Therapeutic Exercise  x x      Balance/Vestibular Training  x x      Patient/Family Education x x x      Cognitive Remediation/Compensation         Functional Mobility Training  x x      Ambulation/Gait Training  x       Museum/gallery curator  x       Wheelchair Propulsion/Positioning  x       Functional Tourist information centre manager Reintegration  x       Dysphagia/Aspiration Film/video editor         Bladder Management x        Bowel Management x        Disease Management/Prevention x        Pain Management x x x      Medication Management x        Skin Care/Wound Management x        Splinting/Orthotics         Discharge Planning  x x      Psychosocial Support   x                         Team Discharge Planning: Destination:  Home Projected Follow-up:  PT and Home Health Projected Equipment Needs:  Scientist, physiological Patient/family involved in discharge planning:  Yes  MD ELOS: 5 days Medical Rehab Prognosis:  Excellent Assessment: Pt admitted for CIR therapies. The team will be addressing: Lower extremity  strength, range of motion, stamina, balance, functional mobility, safety, adaptive techniques and equipment, orthopedic precautions, pain mgt, ADL's, education, wound care. Goals are set at mod I.

## 2012-05-06 NOTE — Progress Notes (Signed)
Patient admitted to 4039 at 1535. Alert and oriented x4, complaints of pain and 2 vicodin given. Patient oriented to unit, rehab schedule, call bell system, and safety plan. Patient verbalized understanding. Hedy Camara

## 2012-05-06 NOTE — Progress Notes (Signed)
Physical Therapy Treatment Patient Details Name: [name] MRN: [id number] DOB: 1955-12-30 Today's Date: 05/06/2012 Time: 1353-1410 PT Time Calculation (min): 17 min  PT Assessment / Plan / Recommendation Comments on Treatment Session  Pt cont's to make progress with mobility + PT goals at this date.  Pt very eager to d/c to CIR today.        Follow Up Recommendations  CIR;Supervision/Assistance - 24 hour     Does the patient have the potential to tolerate intense rehabilitation     Barriers to Discharge        Equipment Recommendations  Rolling walker with 5" wheels    Recommendations for Other Services    Frequency Min 5X/week   Plan Discharge plan remains appropriate;Frequency remains appropriate    Precautions / Restrictions Precautions Precautions: Fall Restrictions Weight Bearing Restrictions: No LLE Weight Bearing: Weight bearing as tolerated   Pertinent Vitals/Pain 6/10  Lt hip with activity.  3/10 Lt hip at rest.       Mobility  Bed Mobility Bed Mobility: Not assessed Transfers Transfers: Sit to Stand;Stand to Sit Sit to Stand: 4: Min guard;With upper extremity assist;With armrests;From chair/3-in-1 Stand to Sit: 4: Min guard;With upper extremity assist;With armrests;To chair/3-in-1 Details for Transfer Assistance: Guarding for safety Ambulation/Gait Ambulation/Gait Assistance: 4: Min guard Ambulation Distance (Feet): 50 Feet Assistive device: Rolling walker Ambulation/Gait Assistance Details: Cues to increase hip/knee flexion as well as increased step/stride length.  Pt beginning to progress from step-to gait pattern to step-through pattern.  Pt reports she is beginning to tolerate increased weight through LLE.   Gait Pattern: Step-to pattern;Step-through pattern;Decreased stance time - left;Decreased hip/knee flexion - left;Decreased weight shift to left;Antalgic Stairs: No Wheelchair Mobility Wheelchair Mobility: No    Exercises General Exercises -  Lower Extremity Ankle Circles/Pumps: AROM;Both;10 reps Quad Sets: AROM;Both;10 reps Gluteal Sets: AROM;Both;10 reps Long Arc Quad: AROM;10 reps;Left Heel Slides: AAROM;Left;10 reps Hip ABduction/ADduction: AAROM;Left;10 reps Straight Leg Raises: AAROM;Left;10 reps     PT Goals Acute Rehab PT Goals PT Goal Formulation: With patient Time For Goal Achievement: 05/10/12 Potential to Achieve Goals: Good Pt will go Supine/Side to Sit: with supervision Pt will go Sit to Supine/Side: with supervision Pt will go Sit to Stand: with supervision PT Goal: Sit to Stand - Progress: Progressing toward goal Pt will go Stand to Sit: with supervision PT Goal: Stand to Sit - Progress: Progressing toward goal Pt will Transfer Bed to Chair/Chair to Bed: with supervision Pt will Ambulate: 51 - 150 feet;with supervision;with least restrictive assistive device PT Goal: Ambulate - Progress: Progressing toward goal Pt will Perform Home Exercise Program: Independently PT Goal: Perform Home Exercise Program - Progress: Progressing toward goal  Visit Information  Last PT Received On: 05/06/12 Assistance Needed: +1    Subjective Data  Subjective: "I didnt know if I was getting PT today" Patient Stated Goal: to be able to walk   Cognition  Overall Cognitive Status: Appears within functional limits for tasks assessed/performed Arousal/Alertness: Awake/alert Orientation Level: Appears intact for tasks assessed Behavior During Session: [facility] for tasks performed    Balance     End of Session PT - End of Session Equipment Utilized During Treatment: Gait belt Activity Tolerance: Patient tolerated treatment well Patient left: in chair;with call bell/phone within reach Nurse Communication: Mobility status     Verdell Face, [place] [phone] 05/06/2012

## 2012-05-06 NOTE — Progress Notes (Signed)
Patient ID: [name], female   DOB: Oct 28, 1955, 56 y.o.   MRN: [id number]     Subjective:  Patient reports pain as mild to moderate.  Had some increased pain last night but is better controlled today. She is concerned about her rehab placement. Objective:   VITALS:   Filed Vitals:   05/05/12 1222 05/05/12 1354 05/05/12 2114 05/06/12 0640  BP:  105/58 87/43 110/56  Pulse: 82 101 87 64  Temp:  98.9 F (37.2 C) 98.1 F (36.7 C) 97.7 F (36.5 C)  TempSrc:  Oral  Oral  Resp:  18 16 20   Height:      Weight:      SpO2: 96% 98% 100% 100%    ABD soft Sensation intact distally Dorsiflexion/Plantar flexion intact Incision: dressing C/D/I and no drainage  LABS  Results for orders placed during the hospital encounter of 05/02/12 (from the past 24 hour(s))  HEMOGLOBIN AND HEMATOCRIT, BLOOD     Status: Abnormal   Collection Time   05/06/12  5:00 AM      Component Value Range   Hemoglobin 8.9 (*) 12.0 - 15.0 g/dL   HCT 40.9 (*) 81.1 - 91.4 %    No results found.  Assessment/Plan: 4 Days Post-Op   Principal Problem:  *Fracture, intertrochanteric, left femur Active Problems:  B12 DEFICIENCY  HYPERTENSION  HEMORRHOIDS  INFLAMMATORY BOWEL DISEASE  COLONIC POLYPS, HYPERPLASTIC, HX OF  H/O Turner syndrome  ABLA: observe.  Advance diet Up with therapy Plan for Rehab placement today.  May need stronger pain medication, currently on hydrocodone.  Continue plan per medicine WBAT left lower ext. Dry dressing PRN   Haskel Khan 05/06/2012, 8:01 AM   Teryl Lucy, MD Cell [phone] Pager [phone]

## 2012-05-06 NOTE — Progress Notes (Signed)
ANTICOAGULATION CONSULT NOTE - Follow-Up Consult  Pharmacy Consult for Coumadin Indication: VTE prophylaxis  No Known Allergies  Patient Measurements: Height: 4\' 9"  (144.8 cm) Weight: 121 lb 4.1 oz (55 kg) IBW/kg (Calculated) : 38.6   Vital Signs: Temp: 97.7 F (36.5 C) (12/17 0640) Temp src: Oral (12/17 0640) BP: 110/43 mmHg (12/17 0934) Pulse Rate: 64  (12/17 0640)  Labs:  Basename 05/06/12 0500 05/05/12 0709 05/04/12 0730  HGB 8.9* 9.6* --  HCT 25.7* 28.2* 31.1*  PLT -- -- --  APTT -- -- --  LABPROT 22.2* 25.0* 19.5*  INR 2.04* 2.39* 1.71*  HEPARINUNFRC -- -- --  CREATININE -- -- --  CKTOTAL -- -- --  CKMB -- -- --  TROPONINI -- -- --    Estimated Creatinine Clearance: 56.7 ml/min (by C-G formula based on Cr of 0.54).  Assessment: 56 yr old female, employee of [employer], was pulling a cart of beverages on the loading dock when the handle of the cart broke and she fell on her hip. S/p IM nail for L hip fracture. Pt on coumadin for VTE prophylaxis (plan for 1 mos treatment per MD note). INR 2.04- after 5 - 5- 2.5 - 1 mg doses.  INR is therapeutic.  No bleeding noted. H/H down to 8.9/25.7  from 10.5/31 on 12/15.1 2nd post op ABLA.    Coumadin education completed 05/03/12 by CA.   Goal of Therapy:  INR 2-3   Plan:  Coumadin 2 mg po daily Daily PT/INR - hope to change to MWF soon Herby Abraham, Pharm.D. [phone] 05/06/2012 10:36 AM   Cardiovascular:  HTN -on home lisinopril  Gastrointestinal / Nutrition: h/o ulcerative colitis, GERD, hemorrhoids, diverticulosis - no meds PTA.  Nephrology: 12/14 Creat stable. Na 133 - will watch. Other lytes ok  Pulmonary: 2L [place]  Hematology / Oncology: H/H down post-op.   PTA Medication Issues: None  Best Practices: Coumadin

## 2012-05-07 ENCOUNTER — Inpatient Hospital Stay (HOSPITAL_COMMUNITY): Payer: Self-pay | Admitting: Occupational Therapy

## 2012-05-07 ENCOUNTER — Inpatient Hospital Stay (HOSPITAL_COMMUNITY): Payer: PRIVATE HEALTH INSURANCE | Admitting: Physical Therapy

## 2012-05-07 LAB — COMPREHENSIVE METABOLIC PANEL
BUN: 14 mg/dL (ref 6–23)
CO2: 29 mEq/L (ref 19–32)
Calcium: 8.8 mg/dL (ref 8.4–10.5)
Chloride: 102 mEq/L (ref 96–112)
Creatinine, Ser: 0.56 mg/dL (ref 0.50–1.10)
GFR calc Af Amer: >90 mL/min (ref >90)
GFR calc non Af Amer: >90 mL/min (ref >90)
Glucose, Bld: 97 mg/dL (ref 70–99)
Total Bilirubin: 0.9 mg/dL (ref 0.3–1.2)

## 2012-05-07 LAB — CBC WITH DIFFERENTIAL/PLATELET
Eosinophils Relative: 3 % (ref 0–5)
HCT: 28.6 % — ABNORMAL LOW (ref 36.0–46.0)
Hemoglobin: 9.5 g/dL — ABNORMAL LOW (ref 12.0–15.0)
Lymphocytes Relative: 35 % (ref 12–46)
MCV: 92.9 fL (ref 78.0–100.0)
Monocytes Absolute: 0.7 10*3/uL (ref 0.1–1.0)
Monocytes Relative: 10 % (ref 3–12)
Neutro Abs: 3.6 10*3/uL (ref 1.7–7.7)
RDW: 13.3 % (ref 11.5–15.5)
WBC: 6.9 10*3/uL (ref 4.0–10.5)

## 2012-05-07 LAB — PROTIME-INR: INR: 1.93 — ABNORMAL HIGH (ref 0.00–1.49)

## 2012-05-07 MED ORDER — FE FUMARATE-B12-VIT C-FA-IFC PO CAPS
1.0000 | ORAL_CAPSULE | Freq: Two times a day (BID) | ORAL | Status: DC
  Administered 2012-05-07 – 2012-05-11 (×9): 1 via ORAL
  Filled 2012-05-07 (×11): qty 1

## 2012-05-07 MED ORDER — WARFARIN SODIUM 3 MG PO TABS
3.0000 mg | ORAL_TABLET | Freq: Once | ORAL | Status: AC
  Administered 2012-05-07: 3 mg via ORAL
  Filled 2012-05-07 (×2): qty 1

## 2012-05-07 NOTE — Progress Notes (Signed)
Occupational Therapy Session Note  Patient Details  Name: [name] MRN: [id number] Date of Birth: April 03, 1956  Today's Date: 05/07/2012 Time: 1300-1330 Time Calculation (min): 30 min  Short Term Goals: Week 1:  OT Short Term Goal 1 (Week 1): STG=LTG  Skilled Therapeutic Interventions/Progress Updates:  Biomedical scientist reintegration;Discharge planning;Cognitive remediation/compensation;DME/adaptive equipment instruction;Disease mangement/prevention;Functional mobility training;Patient/family education;Neuromuscular re-education;Self Care/advanced ADL retraining;Pain management;Therapeutic Activities;Therapeutic Exercise;UE/LE Coordination activities;UE/LE Strength taining/ROM  1:1 focus on practicing LB dressing and undressing with AE with extra time, furniture transfers, functional ambulation with RW and getting in and out of bed with a modified approach to increase independence and compensate for pain, shower stall transfer- stepping over the threshold backward with shower chair   Therapy Documentation Precautions:  Precautions Precautions: Fall Restrictions Weight Bearing Restrictions: No LLE Weight Bearing: Weight bearing as tolerated General: General Chart Reviewed: Yes Family/Caregiver Present: No Vital Signs: Therapy Vitals Temp: 98.3 F (36.8 C) Temp src: Oral Pulse Rate: 95  Resp: 18  BP: 115/53 mmHg Patient Position, if appropriate: Sitting Oxygen Therapy SpO2: 100 % O2 Device: None (Room air) Pain: Pain Assessment Pain Assessment: 0-10 Pain Score:   6 Pain Type: Surgical pain Pain Location: Leg Pain Orientation: Left Pain Descriptors: Throbbing Pain Frequency: Constant Pain Onset: Progressive Patients Stated Pain Goal: 2 Pain Intervention(s): Medication (See eMAR) Multiple Pain Sites: No  See FIM for current functional status  Therapy/Group: Individual Therapy  Roney Mans [facility] 05/07/2012, 3:25 PM

## 2012-05-07 NOTE — H&P (Signed)
Subjective/Complaints: Slept well. Pain medication worked nicely for her.  Objective: Vital Signs: Blood pressure 114/60, pulse 78, temperature 97.8 F (36.6 C), temperature source Oral, resp. rate 17, SpO2 100.00%. No results found.  Basename 05/07/12 0609 05/06/12 0500  WBC 6.9 --  HGB 9.5* 8.9*  HCT 28.6* 25.7*  PLT 250 --    Basename 05/07/12 0609  NA 139  K 4.0  CL 102  GLUCOSE 97  BUN 14  CREATININE 0.56  CALCIUM 8.8   CBG (last 3)  No results found for this basename: GLUCAP:3 in the last 72 hours  Wt Readings from Last 3 Encounters:  05/03/12 55 kg (121 lb 4.1 oz)  05/03/12 55 kg (121 lb 4.1 oz)  04/09/12 55.339 kg (122 lb)    Physical Exam:  Constitutional: She is oriented to person, place, and time. She appears well-developed.  HENT:  Head: Normocephalic.  Eyes:  Pupils round and reactive to light  Neck: Normal range of motion. Neck supple. No thyromegaly present.  Cardiovascular: Normal rate and regular rhythm.  Pulmonary/Chest: Breath sounds normal. She has no wheezes.  Abdominal: Soft. Bowel sounds are normal. She exhibits no distension.  Musculoskeletal: She exhibits no edema.  Left hip tender appropriately. Mild swelling at hip Neurological: She is alert and oriented to person, place, and time.  Follows full commands. Moves all 4's. UE 4+. RLE is 4/5 LLE is 1-2 prox to 4/5 distally. . No sensory deficits  Skin:  Left hip incision clean and dry with steristrips.    Assessment/Plan: 1. Functional deficits secondary to left IT hip fx which require 3+ hours per day of interdisciplinary therapy in a comprehensive inpatient rehab setting. Physiatrist is providing close team supervision and 24 hour management of active medical problems listed below. Physiatrist and rehab team continue to assess barriers to discharge/monitor patient progress toward functional and medical goals. FIM:                   Comprehension Comprehension Mode:  Auditory Comprehension: 7-Follows complex conversation/direction: With no assist  Expression Expression Mode: Verbal Expression: 5-Expresses basic needs/ideas: With no assist  Social Interaction Social Interaction: 7-Interacts appropriately with others - No medications needed.  Problem Solving Problem Solving: 7-Solves complex problems: Recognizes & self-corrects  Memory Memory: 7-Complete Independence: No helper  Medical Problem List and Plan:  1. Left intertrochanteric hip fracture. Status post intramedullary nail 05/02/2012  2. DVT Prophylaxis/Anticoagulation: Coumadin for DVT prophylaxis. Monitor for any bleeding episodes  3. Pain Management: Vicodin and Robaxin as needed. Schedule adjusted with good results 4. Neuropsych: This patient is capable of making decisions on his/her own behalf.  5. Postoperative anemia. Latest hemoglobin 9.5---Fe supplement. No signs of bleeding  6. Hypertension. Lisinopril 10 mg daily. Monitor with increased activity   LOS (Days) 1 A FACE TO FACE EVALUATION WAS PERFORMED  SWARTZ,ZACHARY T 05/07/2012 8:13 AM

## 2012-05-07 NOTE — Evaluation (Signed)
Occupational Therapy Assessment and Plan  Patient Details  Name: [name] MRN: [id number] Date of Birth: 07-Jul-1955  OT Diagnosis: acute pain and muscle weakness (generalized) Rehab Potential: Rehab Potential: Excellent ELOS: 3-5 days   Today's Date: 05/07/2012 Time: 7829-5621 Time Calculation (min): 55 min  Problem List:  Patient Active Problem List  Diagnosis  . B12 DEFICIENCY  . HYPERTENSION  . HEMORRHOIDS  . GERD  . INFLAMMATORY BOWEL DISEASE  . DIVERTICULOSIS, COLON  . COLONIC POLYPS, HYPERPLASTIC, HX OF  . Fracture, intertrochanteric, left femur  . H/O Turner syndrome  . Hip fracture    Past Medical History:  Past Medical History  Diagnosis Date  . Hypertension     off meds after wt loss  . Ulcerative colitis     hx  . Osteopenia   . GERD (gastroesophageal reflux disease)     hx-contrlled now  . Blood in stool   . Colon polyp   . IBD (inflammatory bowel disease)   . Hemorrhoids   . Diverticulosis   . Vitamin B12 deficiency   . Turner's syndrome     was on Provera and Premarin and d/c this  at age 6yr  . Fracture, intertrochanteric, left femur 05/02/2012   Past Surgical History:  Past Surgical History  Procedure Date  . Hernia repair     lt ing   . Tonsillectomy   . Fracture surgery 2004    fx lt thumb  . Upper gastrointestinal endoscopy   . Colonoscopy   . Orif finger fracture 06/14/2011    Procedure: OPEN REDUCTION INTERNAL FIXATION (ORIF) METACARPAL (FINGER) FRACTURE;  Surgeon: Tami Ribas, MD;  Location: [facility];  Service: Orthopedics;  Laterality: Right;  right ring  . Intramedullary (im) nail intertrochanteric 05/02/2012    Procedure: INTRAMEDULLARY (IM) NAIL INTERTROCHANTRIC;  Surgeon: Eulas Post, MD;  Location: [facility] OR;  Service: Orthopedics;  Laterality: Left;    Assessment & Plan Clinical Impression: Patient is a 56 y.o. year old female right-handed female who is an employee at [employer]  admitted 05/02/2012 after trying to load a very heavy pallet of food when she fell backwards landing on her left hip without loss of consciousness. X-rays and imaging revealed left intertrochanteric hip fracture. Underwent intramedullary nail 05/02/2012 per Dr. Dion Saucier. Advised weightbearing as tolerated. Placed on Coumadin for DVT prophylaxis. Postoperative anemia 9.6 and monitored. Pain management with the use of Vicodin.  Patient transferred to CIR on 05/06/2012 .    Patient currently requires min to mod A with basic self-care skills and min A for basic mobility secondary to muscle weakness and acute pain and decreased standing balance and decreased balance strategies.  Prior to hospitalization, patient could complete ADL with independence.  Patient will benefit from skilled intervention to increase independence with basic self-care skills prior to discharge home independently.  Anticipate patient will require A prn with IADLs and no further OT follow recommended.  OT - End of Session Activity Tolerance: Tolerates 30+ min activity without fatigue Endurance Deficit: Yes (due to pain) OT Assessment Rehab Potential: Excellent OT Plan OT Intensity: Minimum of 1-2 x/day, 45 to 90 minutes OT Duration/Estimated Length of Stay: 3-5 days OT Treatment/Interventions: Balance/vestibular training;Community reintegration;Discharge planning;Cognitive remediation/compensation;DME/adaptive equipment instruction;Disease mangement/prevention;Functional mobility training;Patient/family education;Neuromuscular re-education;Self Care/advanced ADL retraining;Pain management;Therapeutic Activities;Therapeutic Exercise;UE/LE Coordination activities;UE/LE Strength taining/ROM OT Recommendation Follow Up Recommendations: None  OT Evaluation Precautions/Restrictions  Precautions Precautions: Fall Restrictions Weight Bearing Restrictions: No LLE Weight Bearing: Weight bearing as tolerated General  Chart Reviewed:  Yes Family/Caregiver Present: No Vital Signs   Pain Pain Assessment Pain Assessment: 0-10 Pain Score:   3 Pain Type: Surgical pain Pain Location: Leg Pain Orientation: Left Pain Descriptors: Throbbing Pain Frequency: Constant Pain Onset: Progressive Patients Stated Pain Goal: 2 Pain Intervention(s): Medication (See eMAR) Multiple Pain Sites: No Home Living/Prior Functioning Home Living Lives With: Spouse Available Help at Discharge: Family (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) Type of Home: House Home Access: Stairs to enter Secretary/administrator of Steps: 1 Home Layout: One level Bathroom Shower/Tub: Health visitor: Standard Bathroom Accessibility: Yes How Accessible: Accessible via walker Home Adaptive Equipment: None ADL   Vision/Perception  Vision - History Baseline Vision: Wears glasses all the time Patient Visual Report: No change from baseline Vision - Assessment Eye Alignment: Within Functional Limits Perception Perception: Within Functional Limits Praxis Praxis: Intact  Cognition Overall Cognitive Status: Appears within functional limits for tasks assessed Arousal/Alertness: Awake/alert Orientation Level: Oriented X4 Sensation Sensation Light Touch: Appears Intact Stereognosis: Appears Intact Hot/Cold: Appears Intact Proprioception: Appears Intact Coordination Gross Motor Movements are Fluid and Coordinated: Yes Fine Motor Movements are Fluid and Coordinated: Yes Motor  Motor Motor: Within Functional Limits Motor - Skilled Clinical Observations: generalized weakness, acute pain, swelling in Left LE Mobility  Transfers Sit to Stand: 4: Min guard Stand to Sit: 4: Min guard  Trunk/Postural Assessment  Cervical Assessment Cervical Assessment: Within Functional Limits (forward head position) Thoracic Assessment Thoracic Assessment: Within Functional Limits Lumbar Assessment Lumbar Assessment: Within Functional Limits Postural  Control Postural Control: Within Functional Limits  Balance Dynamic Standing Balance Dynamic Standing - Balance Support: During functional activity Dynamic Standing - Level of Assistance: 4: Min assist Extremity/Trunk Assessment RUE Assessment RUE Assessment: Within Functional Limits LUE Assessment LUE Assessment: Within Functional Limits  See FIM for current functional status Refer to Care Plan for Long Term Goals  Recommendations for other services: None  Discharge Criteria: Patient will be discharged from OT if patient refuses treatment 3 consecutive times without medical reason, if treatment goals not met, if there is a change in medical status, if patient makes no progress towards goals or if patient is discharged from hospital.  The above assessment, treatment plan, treatment alternatives and goals were discussed and mutually agreed upon: by patient  1:1 OT eval initiated with OT role, purpose and goals discussed with pt. Self care retraining at shower level with focus on RW safety with functional mobility, sit to stands, stand to sits, LB dressing, activity tolerance.   Roney Mans [facility] 05/07/2012, 11:48 AM

## 2012-05-07 NOTE — Evaluation (Signed)
Physical Therapy Assessment and Plan  Patient Details  Name: [name] MRN: [id number] Date of Birth: 1956/01/30  PT Diagnosis: Abnormality of gait, Difficulty walking, Muscle weakness and Pain in hip Rehab Potential: Good ELOS: 3-5 days   Today's Date: 05/07/2012 Time: 1030-1130 Time Calculation (min): 60 min  Problem List:  Patient Active Problem List  Diagnosis  . B12 DEFICIENCY  . HYPERTENSION  . HEMORRHOIDS  . GERD  . INFLAMMATORY BOWEL DISEASE  . DIVERTICULOSIS, COLON  . COLONIC POLYPS, HYPERPLASTIC, HX OF  . Fracture, intertrochanteric, left femur  . H/O Turner syndrome  . Hip fracture    Past Medical History:  Past Medical History  Diagnosis Date  . Hypertension     off meds after wt loss  . Ulcerative colitis     hx  . Osteopenia   . GERD (gastroesophageal reflux disease)     hx-contrlled now  . Blood in stool   . Colon polyp   . IBD (inflammatory bowel disease)   . Hemorrhoids   . Diverticulosis   . Vitamin B12 deficiency   . Turner's syndrome     was on Provera and Premarin and d/c this  at age 62yr  . Fracture, intertrochanteric, left femur 05/02/2012   Past Surgical History:  Past Surgical History  Procedure Date  . Hernia repair     lt ing   . Tonsillectomy   . Fracture surgery 2004    fx lt thumb  . Upper gastrointestinal endoscopy   . Colonoscopy   . Orif finger fracture 06/14/2011    Procedure: OPEN REDUCTION INTERNAL FIXATION (ORIF) METACARPAL (FINGER) FRACTURE;  Surgeon: Tami Ribas, MD;  Location: [facility];  Service: Orthopedics;  Laterality: Right;  right ring  . Intramedullary (im) nail intertrochanteric 05/02/2012    Procedure: INTRAMEDULLARY (IM) NAIL INTERTROCHANTRIC;  Surgeon: Eulas Post, MD;  Location: [facility];  Service: Orthopedics;  Laterality: Left;    Assessment & Plan Clinical Impression: Patient is a 56 y.o. year old female with recent admission to the hospital on 05/02/2012 after trying to load  a very heavy pallet of food when she fell backwards landing on her left hip without loss of consciousness. X-rays and imaging revealed left intertrochanteric hip fracture. Underwent intramedullary nail 05/02/2012 per Dr. Dion Saucier. Advised weightbearing as tolerated. .  Patient transferred to CIR on 05/06/2012 .   Patient currently requires supervision with mobility secondary to muscle weakness and decreased standing balance.  Prior to hospitalization, patient was independent with mobility and lived with Spouse in a House home.  Home access is 1Stairs to enter.  Patient will benefit from skilled PT intervention to maximize safe functional mobility, minimize fall risk and decrease caregiver burden for planned discharge home with intermittent assist.  Anticipate patient will benefit from follow up [facility] at discharge.  PT - End of Session Activity Tolerance: Tolerates 30+ min activity with multiple rests Endurance Deficit: Yes Endurance Deficit Description: limited by pain PT Assessment Rehab Potential: Good PT Plan PT Intensity: Minimum of 1-2 x/day ,45 to 90 minutes PT Frequency: 5 out of 7 days PT Duration Estimated Length of Stay: 3-5 days PT Treatment/Interventions: Ambulation/gait training;Community reintegration;DME/adaptive equipment instruction;Neuromuscular re-education;Therapeutic Exercise;Pain management;Discharge planning;Balance/vestibular training;Patient/family education;Functional mobility training;Therapeutic Activities;UE/LE Coordination activities;Wheelchair propulsion/positioning;Stair training;UE/LE Strength taining/ROM PT Recommendation Follow Up Recommendations: Home health PT Equipment Recommended: Rolling walker with 5" wheels  PT Evaluation Precautions/Restrictions Precautions Precautions: Fall Restrictions Weight Bearing Restrictions: No LLE Weight Bearing: Weight bearing as tolerated Pain Pain Assessment  Pain Assessment: No/denies pain  Home Living/Prior  Functioning Home Living Lives With: Spouse Available Help at Discharge: Family Type of Home: House Home Access: Stairs to enter Secretary/administrator of Steps: 1 Home Layout: One level Bathroom Shower/Tub: Health visitor: Standard Bathroom Accessibility: Yes How Accessible: Accessible via walker Home Adaptive Equipment: None Prior Function Level of Independence: Independent with homemaking with ambulation;Independent with gait;Independent with transfers;Independent with basic ADLs Able to Take Stairs?: Yes Driving: Yes Vocation: Full time employment  Cognition Overall Cognitive Status: Appears within functional limits for tasks assessed Arousal/Alertness: Awake/alert Orientation Level: Oriented X4 Sensation Sensation Light Touch: Appears Intact Stereognosis: Appears Intact Hot/Cold: Appears Intact Proprioception: Appears Intact Coordination Gross Motor Movements are Fluid and Coordinated: Yes Fine Motor Movements are Fluid and Coordinated: Yes Motor  Motor Motor: Within Functional Limits Motor - Skilled Clinical Observations: generalized weakness  Mobility Bed Mobility Bed Mobility: Sit to Supine Supine to Sit: 5: Supervision Sit to Supine: 4: Min assist Sit to Supine - Details (indicate cue type and reason): assist for L LE Transfers Sit to Stand: 5: Supervision Sit to Stand Details (indicate cue type and reason): cues for UE placement Stand to Sit: 4: Min guard Stand Pivot Transfers: 5: Supervision Locomotion  Ambulation Ambulation/Gait Assistance: 4: Min assist Ambulation Distance (Feet): 3 Feet Assistive device: None Ambulation/Gait Assistance Details: pt with decreased wt bearing on L LE, difficulty to support weight on L during R swing phase Stairs / Additional Locomotion Stairs: Yes Stair Management Technique: With walker Number of Stairs: 1  Wheelchair Mobility Wheelchair Mobility: No  Trunk/Postural Assessment  Cervical  Assessment Cervical Assessment: Within Functional Limits Thoracic Assessment Thoracic Assessment: Within Functional Limits Lumbar Assessment Lumbar Assessment: Within Functional Limits Postural Control Postural Control: Within Functional Limits  Balance Dynamic Standing Balance Dynamic Standing - Balance Support: During functional activity Dynamic Standing - Level of Assistance: 5: Stand by assistance Extremity Assessment  RUE Assessment RUE Assessment: Within Functional Limits LUE Assessment LUE Assessment: Within Functional Limits RLE Assessment RLE Assessment: Within Functional Limits LLE Assessment LLE Assessment:  (hip 2+/5, knee and ankle 3/5)  See FIM for current functional status Refer to Care Plan for Long Term Goals  Recommendations for other services: None  Discharge Criteria: Patient will be discharged from PT if patient refuses treatment 3 consecutive times without medical reason, if treatment goals not met, if there is a change in medical status, if patient makes no progress towards goals or if patient is discharged from hospital.  The above assessment, treatment plan, treatment alternatives and goals were discussed and mutually agreed upon: by patient  Treatment initiated during session: Gait training with RW 100' x 3 with close supervision, pt with good step through gait, limited by fatigue.  Curb step training with RW with min A, cues for sequencing and safety.  Supine therex AAROM for L LE, pt able to perform 2 x 10 with increased fatigue during abduction and SLR.  Jerrett Baldinger 05/07/2012, 12:18 PM

## 2012-05-07 NOTE — Progress Notes (Signed)
Patient information reviewed and entered into eRehab system by Trivia Heffelfinger, RN, CRRN, PPS Coordinator.  Information including medical coding and functional independence measure will be reviewed and updated through discharge.    

## 2012-05-07 NOTE — Progress Notes (Signed)
Patient watched coumadin video on 6N per patient report. Hedy Camara

## 2012-05-07 NOTE — Progress Notes (Signed)
ANTICOAGULATION CONSULT NOTE - Follow-Up Consult  Pharmacy Consult for Coumadin Indication: VTE prophylaxis  No Known Allergies  Patient Measurements:    Vital Signs: Temp: 97.8 F (36.6 C) (12/18 0647) Temp src: Oral (12/18 0647) BP: 114/60 mmHg (12/18 0647) Pulse Rate: 78  (12/18 0647)  Labs:  Basename 05/07/12 0609 05/06/12 0500 05/05/12 0709  HGB 9.5* 8.9* --  HCT 28.6* 25.7* 28.2*  PLT 250 -- --  APTT -- -- --  LABPROT 21.3* 22.2* 25.0*  INR 1.93* 2.04* 2.39*  HEPARINUNFRC -- -- --  CREATININE 0.56 -- --  CKTOTAL -- -- --  CKMB -- -- --  TROPONINI -- -- --    The CrCl is unknown because both a height and weight (above a minimum accepted value) are required for this calculation.  Assessment: 56 yr old female, employee of [employer], was pulling a cart of beverages on the loading dock when the handle of the cart broke and she fell on her hip. S/p IM nail for L hip fracture. Pt on coumadin for VTE prophylaxis (plan for 1 mos treatment per MD note). INR 1.93 today - slightly subtherapeutic.  No bleeding noted. H/H low but stable    Goal of Therapy:  INR 2-3   Plan:  Coumadin 3 mg po today Daily PT/INR  Christoper Fabian, PharmD, BCPS Clinical pharmacist, pager [phone] 05/07/2012 1:01 PM

## 2012-05-07 NOTE — Progress Notes (Signed)
Physical Therapy Note  Patient Details  Name: [name] MRN: [id number] Date of Birth: 07-30-1955 Today's Date: 05/07/2012  Time: 1400-1445 45 minutes  Pt c/o 4/10 pain, rec'd pain meds prior to session.  W/c mobility for UE strength and endurance x 100' with supervision.  Car transfer to simulated sedan with supervision, cues for technique.  Car transfer to simulated SUV with min A, pt with increased pain during SUV transfer.  Discussed borrowing her son-in-law's sedan for transport while she is recovering, pt expresses understanding.  Nu step x 8 minutes level 2 for AAROM.  Pt limited by increased pain this afternoon.  Gait 100' x 2 with RW, supervision.  Individual therapy   [name] 05/07/2012, 2:47 PM

## 2012-05-08 ENCOUNTER — Encounter (HOSPITAL_COMMUNITY): Payer: Self-pay | Admitting: Occupational Therapy

## 2012-05-08 ENCOUNTER — Inpatient Hospital Stay (HOSPITAL_COMMUNITY): Payer: PRIVATE HEALTH INSURANCE | Admitting: Physical Therapy

## 2012-05-08 ENCOUNTER — Inpatient Hospital Stay (HOSPITAL_COMMUNITY): Payer: Self-pay | Admitting: Occupational Therapy

## 2012-05-08 LAB — PROTIME-INR
INR: 2.03 — ABNORMAL HIGH (ref 0.00–1.49)
Prothrombin Time: 22.1 seconds — ABNORMAL HIGH (ref 11.6–15.2)

## 2012-05-08 LAB — VITAMIN D 1,25 DIHYDROXY: Vitamin D 1, 25 (OH)2 Total: 51 pg/mL (ref 18–72)

## 2012-05-08 MED ORDER — WARFARIN SODIUM 3 MG PO TABS
3.0000 mg | ORAL_TABLET | Freq: Every day | ORAL | Status: DC
  Administered 2012-05-08 – 2012-05-10 (×3): 3 mg via ORAL
  Filled 2012-05-08 (×4): qty 1

## 2012-05-08 NOTE — Progress Notes (Signed)
Physical Therapy Note  Patient Details  Name: [name] MRN: [id number] Date of Birth: 1955/11/21 Today's Date: 05/08/2012  Time: 800-857 57 minutes  Pt c/o 6/10 hip pain, RN made aware and pt rec'd pain meds.  Gait with RW controlled environment with improved step through pattern, improved wt beraing on L LE.  Gait with obstacle negotiation, fwd/bkwd/lateral walking with supervision, no increased pain. Standing therex for hip flexor, extensor and abductor strength.  Pt with increased pain with hip flexion exercises, improvement in strength of abductors.  Supine therex HEP review 2 x 10 all exercises for L LE strength and endurance.  Pt requires AAROM with heel slides and SLR due to weakness and fatigue.  Individual therapy   Srihitha Tagliaferri 05/08/2012, 8:57 AM

## 2012-05-08 NOTE — Progress Notes (Signed)
Physical Therapy Note  Patient Details  Name: [name] MRN: [id number] Date of Birth: 04/14/1956 Today's Date: 05/08/2012  Time: 1130-1155 25 minutes  Pt c/o 7/10 pain in hip, repositioned and rest as needed.  Car transfer training with pt performing with supervision to sedan and SUV heights, pt's daughter present to observe transfers.  Pt/daughter express understanding of safety and technique for car transfers.  Gait short distances due to pain, pt continues with fluid gait pattern despite pain.  Individual therapy   Franceska Strahm 05/08/2012, 12:56 PM

## 2012-05-08 NOTE — Progress Notes (Signed)
[facility]patient [facility] Individual Statement of Services  Patient Name:  [name]  Date:  05/08/2012  Welcome to the [facility]patient Rehabilitation [facility].  Our goal is to provide you with an individualized program based on your diagnosis and situation, designed to meet your specific needs.  With this comprehensive rehabilitation program, you will be expected to participate in at least 3 hours of rehabilitation therapies Monday-Friday, with modified therapy programming on the weekends.  Your rehabilitation program will include the following services:  Physical Therapy (PT), Occupational Therapy (OT), 24 hour per day rehabilitation nursing, Therapeutic Recreaction (TR), Case Management (Social Worker), Rehabilitation Medicine, Nutrition Services and Pharmacy Services  Weekly team conferences will be held on Tuesdays to discuss your progress.  Your  Social Worker will talk with you frequently to get your input and to update you on team discussions.  Team conferences with you and your family in attendance may also be held.  Expected length of stay: 3-5 days  Overall anticipated outcome: modified independent  Depending on your progress and recovery, your program may change.  Your  Social Worker will coordinate services and will keep you informed of any changes.  Your  Social Worker's name and contact numbers are listed  below.  The following services may also be recommended but are not provided by the Inpatient [facility] Center:   Driving Evaluations  Home Health Rehabiltiation Services  Outpatient Rehabilitatation [facility]  Vocational Rehabilitation   Arrangements will be made to provide these services after discharge if needed.  Arrangements include referral to agencies that provide these services.  Your insurance has been verified to be:  Worker's Comp Your primary doctor is:  Dr. Kriste Basque  Pertinent information will be shared with your doctor and your insurance company.   Social Worker:  [place], [place] [phone] or [phone]  Information discussed with and copy given to patient by: Amada Jupiter, 05/08/2012, 10:50 AM

## 2012-05-08 NOTE — Progress Notes (Signed)
Subjective/Complaints: No new issues. Up walking with therapy already. A 12 point review of systems has been performed and if not noted above is otherwise negative.   Objective: Vital Signs: Blood pressure 121/74, pulse 68, temperature 97.8 F (36.6 C), temperature source Oral, resp. rate 18, SpO2 98.00%. No results found.  Basename 05/07/12 0609 05/06/12 0500  WBC 6.9 --  HGB 9.5* 8.9*  HCT 28.6* 25.7*  PLT 250 --    Basename 05/07/12 0609  NA 139  K 4.0  CL 102  GLUCOSE 97  BUN 14  CREATININE 0.56  CALCIUM 8.8   CBG (last 3)  No results found for this basename: GLUCAP:3 in the last 72 hours  Wt Readings from Last 3 Encounters:  05/03/12 55 kg (121 lb 4.1 oz)  05/03/12 55 kg (121 lb 4.1 oz)  04/09/12 55.339 kg (122 lb)    Physical Exam:  Constitutional: She is oriented to person, place, and time. She appears well-developed.  HENT:  Head: Normocephalic.  Eyes:  Pupils round and reactive to light  Neck: Normal range of motion. Neck supple. No thyromegaly present.  Cardiovascular: Normal rate and regular rhythm.  Pulmonary/Chest: Breath sounds normal. She has no wheezes.  Abdominal: Soft. Bowel sounds are normal. She exhibits no distension.  Musculoskeletal: She exhibits no edema.  Left hip tender appropriately. Mild swelling at hip Neurological: She is alert and oriented to person, place, and time.  Follows full commands. Moves all 4's. UE 4+. RLE is 4/5 LLE is 1-2 prox to 4/5 distally. . No sensory deficits  Skin:  Left hip incision clean and dry with steristrips.    Assessment/Plan: 1. Functional deficits secondary to left IT hip fx which require 3+ hours per day of interdisciplinary therapy in a comprehensive inpatient rehab setting. Physiatrist is providing close team supervision and 24 hour management of active medical problems listed below. Physiatrist and rehab team continue to assess barriers to discharge/monitor patient progress toward functional and  medical goals. FIM: FIM - Bathing Bathing Steps Patient Completed: Chest;Right Arm;Left Arm;Left upper leg;Right upper leg;Abdomen;Front perineal area;Buttocks Bathing: 4: Min-Patient completes 8-9 9f 10 parts or 75+ percent  FIM - Upper Body Dressing/Undressing Upper body dressing/undressing steps patient completed: Thread/unthread right sleeve of pullover shirt/dresss;Thread/unthread left sleeve of pullover shirt/dress;Put head through opening of pull over shirt/dress;Pull shirt over trunk Upper body dressing/undressing: 5: Set-up assist to: Obtain clothing/put away FIM - Lower Body Dressing/Undressing Lower body dressing/undressing steps patient completed: Thread/unthread right underwear leg;Thread/unthread right pants leg;Pull underwear up/down;Pull pants up/down Lower body dressing/undressing: 3: Mod-Patient completed 50-74% of tasks  FIM - Toileting Toileting steps completed by patient: Adjust clothing prior to toileting;Performs perineal hygiene;Adjust clothing after toileting Toileting Assistive Devices: Grab bar or rail for support Toileting: 4: Steadying assist     FIM - Bed/Chair Transfer Bed/Chair Transfer: 4: Sit > Supine: Min A (steadying pt. > 75%/lift 1 leg);5: Supine > Sit: Supervision (verbal cues/safety issues);5: Bed > Chair or W/C: Supervision (verbal cues/safety issues);5: Chair or W/C > Bed: Supervision (verbal cues/safety issues)  FIM - Locomotion: Wheelchair Locomotion: Wheelchair: 2: Travels 50 - 149 ft with supervision, cueing or coaxing FIM - Locomotion: Ambulation Ambulation/Gait Assistance: 4: Min assist Locomotion: Ambulation: 1: Travels less than 50 ft with minimal assistance (Pt.>75%)  Comprehension Comprehension Mode: Auditory Comprehension: 7-Follows complex conversation/direction: With no assist  Expression Expression Mode: Verbal Expression: 7-Expresses complex ideas: With no assist  Social Interaction Social Interaction: 6-Interacts  appropriately with others with medication or extra time (anti-anxiety,  antidepressant).  Problem Solving Problem Solving: 7-Solves complex problems: Recognizes & self-corrects  Memory Memory: 7-Complete Independence: No helper  Medical Problem List and Plan:  1. Left intertrochanteric hip fracture. Status post intramedullary nail 05/02/2012  2. DVT Prophylaxis/Anticoagulation: Coumadin for DVT prophylaxis. Monitor for any bleeding episodes  3. Pain Management: Vicodin and Robaxin as needed. Schedule adjusted with good results 4. Neuropsych: This patient is capable of making decisions on his/her own behalf.  5. Postoperative anemia. Latest hemoglobin 9.5---Fe supplement. No signs of bleeding  6. Hypertension. Lisinopril 10 mg daily. Good control at present   LOS (Days) 2 A FACE TO FACE EVALUATION WAS PERFORMED  Duru Reiger T 05/08/2012 8:21 AM

## 2012-05-08 NOTE — Progress Notes (Signed)
Social Work Patient ID: [name], female   DOB: Jun 02, 1955, 56 y.o.   MRN: [id number]  Alerted by therapies that they feel pt will be ready for d/c by 05/11/12.  Have discussed with Dr. Riley Kill who is agreeable with this target date.  Pt and family also aware and agreeable.  Pt's sister to arrive in town that day and can assist with her d/c.  Currently working with [employer] comp to coordinate [facility] f/u and DME.    Melissa Weightman, LCSW

## 2012-05-08 NOTE — Progress Notes (Signed)
ANTICOAGULATION CONSULT NOTE - Follow-Up Consult  Pharmacy Consult for Coumadin Indication: VTE prophylaxis  No Known Allergies  Patient Measurements:    Vital Signs: Temp: 97.8 F (36.6 C) (12/19 0651) Temp src: Oral (12/19 0651) BP: 121/74 mmHg (12/19 0651) Pulse Rate: 68  (12/19 0651)  Labs:  Basename 05/08/12 0606 05/07/12 0609 05/06/12 0500  HGB -- 9.5* 8.9*  HCT -- 28.6* 25.7*  PLT -- 250 --  APTT -- -- --  LABPROT 22.1* 21.3* 22.2*  INR 2.03* 1.93* 2.04*  HEPARINUNFRC -- -- --  CREATININE -- 0.56 --  CKTOTAL -- -- --  CKMB -- -- --  TROPONINI -- -- --    The CrCl is unknown because both a height and weight (above a minimum accepted value) are required for this calculation.  Assessment: 56 yr old female, employee of [employer], was pulling a cart of beverages on the loading dock when the handle of the cart broke and she fell on her hip. S/p IM nail for L hip fracture. Pt on coumadin for VTE prophylaxis (plan for 1 mos treatment per MD note). INR just reached goal today; therapeutic at 2.03.  No bleeding noted.  Goal of Therapy:  INR 2-3   Plan:  Try scheduled dose of Coumadin 3 mg po daily Daily PT/INR  Happy [place] L. Illene Bolus, PharmD, BCPS Clinical Pharmacist Pager: [phone] Pharmacy: [phone] 05/08/2012 12:58 PM

## 2012-05-08 NOTE — Progress Notes (Signed)
Social Work  Social Work Assessment and Plan  Patient Details  Name: [name] MRN: [id number] Date of Birth: October 23, 1955  Today's Date: 05/08/2012  Problem List:  Patient Active Problem List  Diagnosis  . B12 DEFICIENCY  . HYPERTENSION  . HEMORRHOIDS  . GERD  . INFLAMMATORY BOWEL DISEASE  . DIVERTICULOSIS, COLON  . COLONIC POLYPS, HYPERPLASTIC, HX OF  . Fracture, intertrochanteric, left femur  . H/O Turner syndrome  . Hip fracture   Past Medical History:  Past Medical History  Diagnosis Date  . Hypertension     off meds after wt loss  . Ulcerative colitis     hx  . Osteopenia   . GERD (gastroesophageal reflux disease)     hx-contrlled now  . Blood in stool   . Colon polyp   . IBD (inflammatory bowel disease)   . Hemorrhoids   . Diverticulosis   . Vitamin B12 deficiency   . Turner's syndrome     was on Provera and Premarin and d/c this  at age 72yr  . Fracture, intertrochanteric, left femur 05/02/2012   Past Surgical History:  Past Surgical History  Procedure Date  . Hernia repair     lt ing   . Tonsillectomy   . Fracture surgery 2004    fx lt thumb  . Upper gastrointestinal endoscopy   . Colonoscopy   . Orif finger fracture 06/14/2011    Procedure: OPEN REDUCTION INTERNAL FIXATION (ORIF) METACARPAL (FINGER) FRACTURE;  Surgeon: Tami Ribas, MD;  Location: [facility];  Service: Orthopedics;  Laterality: Right;  right ring  . Intramedullary (im) nail intertrochanteric 05/02/2012    Procedure: INTRAMEDULLARY (IM) NAIL INTERTROCHANTRIC;  Surgeon: Eulas Post, MD;  Location: [facility];  Service: Orthopedics;  Laterality: Left;   Social History:  reports that she has never smoked. She has never used smokeless tobacco. She reports that she drinks alcohol. She reports that she does not use illicit drugs.  Family / Support Systems Marital Status: Married How Long?: 31 years Patient Roles: Scientist, product/process development (Pt has 2 grandchildren who she  cares for at times.) Spouse/Significant Other: husband has degenerative dementia and pt provides care Children: daughter, [name] @ (C) [phone] and [name] - both local [place] does not drive) Other Supports: sister and mother to visit approx one week each over the next few weeks (both living in [place]) Anticipated Caregiver: Pt has a daughter who works f/t and unable to assist. 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 He goes to adult daycare while she works. Pt works f/t at [employer]. 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  Denies any significant emotional distress - will monitor. Recent Psychosocial Issues: Pt's husband with degenerative process (cognitive deficits) which began approx 2 years ago and pt has assumed caregiver roles Pyschiatric History: None Substance Abuse History: None  Patient / Family Perceptions, Expectations & Goals Pt/Family understanding  of illness & functional limitations: Pt with good understanding of her injury and functional limitations Premorbid pt/family roles/activities: Pt was completely independent, working f/t and caregiver to spouse Anticipated changes in roles/activities/participation: Pt's daughter and  other family to assist her and her husband Pt/family expectations/goals: "I just want to keep getting better like I am"  [employer]: None Premorbid Home Care/DME Agencies: None Transportation available at discharge: yes  Discharge Planning Living Arrangements: Spouse/significant other Support Systems: Spouse/significant other;Children;Other relatives Type of Residence: Private residence Insurance Resources: Media planner (specify) (WORKER'S COMP) Financial Resources: Employment Financial Screen Referred: No Living Expenses: Database administrator Management: Patient Do you have any problems obtaining your medications?: No Home Management: pt and husband (with pt's direction) Patient/Family Preliminary Plans: Pt to return home with spouse  - sister and mother to spend approx one week each over the holidays with pt and her husband. Social Work Anticipated Follow Up Needs: HH/OP Expected length of stay: 1 week  Clinical Impression Pleasant, motivated woman here after fall with hip fx - very motivated and has good family support.  No significant emotional distress but will monitor.  [name] 05/08/2012, 10:49 AM

## 2012-05-08 NOTE — Progress Notes (Signed)
Occupational Therapy Session Note  Patient Details  Name: [name] MRN: [id number] Date of Birth: 20-Jan-1956  Today's Date: 05/08/2012 Time: 4098-1191 Time Calculation (min): 45 min  Short Term Goals: Week 1:  OT Short Term Goal 1 (Week 1): STG=LTG  Skilled Therapeutic Interventions/Progress Updates:    1:1 self care retraining at shower level with focus on building confidence in ability, sit to stands, transfers (including stepping over threshold), standing balance, functional mobility. No c/o pain in this session  2nd session: 13:00-14:00  Pt reports higher pain this pm but can continue with therapy- RN aware, ice applied after therapy; focus on bed mobility mod I with adaptive strategy, functional ambulation with RW, navigating through obstacle course with focus on RW safety, stepping over thresholds, up a stair and going in and out of narrow spaces, Nustep for activity tolerance and endurance, practiced simulated shower stall transfer over thresholds backwards and transitioning to shower chair.  Therapy Documentation Precautions:  Precautions Precautions: Fall Restrictions Weight Bearing Restrictions: No LLE Weight Bearing: Weight bearing as tolerated    Vital Signs: Therapy Vitals Temp: 97.7 F (36.5 C) Temp src: Oral Pulse Rate: 74  Resp: 20  BP: 99/77 mmHg Patient Position, if appropriate: Sitting Oxygen Therapy SpO2: 99 % O2 Device: None (Room air) Pain: in pm Pain Assessment Pain Assessment: 0-10 Pain Score:   8 Pain Type: Surgical pain Pain Location: Hip Pain Orientation: Left Pain Descriptors: Throbbing Pain Frequency: Intermittent Pain Onset: On-going Patients Stated Pain Goal: 2 Pain Intervention(s): Medication (See eMAR)  See FIM for current functional status  Therapy/Group: Individual Therapy  Roney Mans [facility] 05/08/2012, 3:56 PM

## 2012-05-09 ENCOUNTER — Inpatient Hospital Stay (HOSPITAL_COMMUNITY): Payer: PRIVATE HEALTH INSURANCE | Admitting: Physical Therapy

## 2012-05-09 ENCOUNTER — Inpatient Hospital Stay (HOSPITAL_COMMUNITY): Payer: PRIVATE HEALTH INSURANCE | Admitting: *Deleted

## 2012-05-09 DIAGNOSIS — W19XXXA Unspecified fall, initial encounter: Secondary | ICD-10-CM

## 2012-05-09 DIAGNOSIS — S72143A Displaced intertrochanteric fracture of unspecified femur, initial encounter for closed fracture: Secondary | ICD-10-CM

## 2012-05-09 DIAGNOSIS — D62 Acute posthemorrhagic anemia: Secondary | ICD-10-CM

## 2012-05-09 LAB — PROTIME-INR: Prothrombin Time: 24.2 seconds — ABNORMAL HIGH (ref 11.6–15.2)

## 2012-05-09 MED ORDER — VITAMIN D (ERGOCALCIFEROL) 1.25 MG (50000 UNIT) PO CAPS: 50000.0000 [IU] | ORAL_CAPSULE | ORAL | Status: DC

## 2012-05-09 MED ORDER — WARFARIN SODIUM 3 MG PO TABS: 3.0000 mg | ORAL_TABLET | Freq: Every day | ORAL | Status: DC

## 2012-05-09 MED ORDER — LISINOPRIL 5 MG PO TABS: 5.0000 mg | ORAL_TABLET | Freq: Every day | ORAL | Status: DC

## 2012-05-09 MED ORDER — HYDROCODONE-ACETAMINOPHEN 5-325 MG PO TABS: 1.0000 | ORAL_TABLET | ORAL | Status: DC | PRN

## 2012-05-09 MED ORDER — FE FUMARATE-B12-VIT C-FA-IFC PO CAPS: 1.0000 | ORAL_CAPSULE | Freq: Two times a day (BID) | ORAL | Status: DC

## 2012-05-09 MED ORDER — METHOCARBAMOL 500 MG PO TABS: 500.0000 mg | ORAL_TABLET | Freq: Four times a day (QID) | ORAL | Status: DC | PRN

## 2012-05-09 MED ORDER — VITAMIN D (ERGOCALCIFEROL) 1.25 MG (50000 UNIT) PO CAPS
50000.0000 [IU] | ORAL_CAPSULE | ORAL | Status: DC
  Administered 2012-05-09: 50000 [IU] via ORAL
  Filled 2012-05-09: qty 1

## 2012-05-09 NOTE — Progress Notes (Signed)
Physical Therapy Note  Patient Details  Name: [name] MRN: [id number] Date of Birth: Oct 14, 1955 Today's Date: 05/09/2012  Time: [phone] 60 minutes  Pt c/o 5/10 hip pain, had rec'd pain meds prior to session.  Household and controlled environment gait training with obstacle negotiation and fwd/backward/lateral stepping all with mod I with RW.  Curb step training to simulate home entry x 8 attempts with RW mod I.  Review of supine/seated and standing HEP with handout.  Pt able to perform using handout with supervision.  Pt continues to require assist for supine hip abduction.  Pt reports she and family feel confident with her to discharge home on Sunday at [facility] I level.  Individual therapy   Caige Almeda 05/09/2012, 12:14 PM

## 2012-05-09 NOTE — Discharge Summary (Signed)
[name], [name]  MEDICAL RECORD NO.:  [id number]  LOCATION:  4144                         FACILITY:  [facility]  PHYSICIAN:  Ranelle Oyster, M.D.DATE OF BIRTH:  August 08, 1955  DATE OF ADMISSION:  05/06/2012 DATE OF DISCHARGE:  05/11/2012                              DISCHARGE SUMMARY   DISCHARGE DIAGNOSES: 1. Left intertrochanteric hip fracture with intramedullary nailing     May 02, 2012. 2. Coumadin for DVT prophylaxis. 3. Pain management. 4. Postoperative anemia. 5. Hypertension.  This is a 56 year old right-handed female who is an employee at [employer] [facility] admitted May 02, 2012, after trying to load a very heavy pallet of food when she fell backwards landing on her left hip without loss of consciousness.  X-rays and imaging revealed left intertrochanteric hip fracture.  Underwent intramedullary nail May 02, 2012, per Dr. Dion Saucier.  Advised weightbearing as tolerated.  Placed on Coumadin for DVT prophylaxis.  Postoperative anemia 9.6 and monitored.  Postoperative pain control with Vicodin.  Physical and occupational therapy ongoing.  The patient was admitted for a comprehensive rehab program and felt to be a good candidate.  PAST MEDICAL HISTORY:  See discharge diagnoses.  SOCIAL HISTORY:  Lives with spouse.  Husband able to provide physical assistance.  She has a daughter in the area that works.  They live in 1- level home.  FUNCTIONAL HISTORY:  Prior to admission was independent.  Works full time at [employer] in [employer]  FUNCTIONAL STATUS:  Upon admission to rehab services was minimal assist to ambulate 30 feet with a rolling walker, moderate assist for sit to scooting to the edge of bed.  PHYSICAL EXAMINATION:  VITAL SIGNS:  Blood pressure 107/78, pulse 62, respirations 19, temperature 98. GENERAL:  This is an alert female, in no acute distress, oriented x3. HEENT:  Pupils round and  reactive to light. LUNGS:  Clear to auscultation. CARDIAC:  Regular rate and rhythm. ABDOMEN:  Soft, nontender.  Good bowel sounds. MUSCULOSKELETAL:  Left hip incision clean and dry.  REHABILITATION HOSPITAL COURSE:  The patient was admitted to inpatient rehab services with therapies initiated on a 3-hour daily basis consisting of physical therapy, occupational therapy, and 24-hour rehabilitation nursing.  The following issues were addressed during the patient's rehabilitation stay.  Pertaining to [name]'s left intertrochanteric hip fracture, she had undergone intramedullary nailing on May 02, 2012, per Dr. Dion Saucier.  She was weightbearing as tolerated.  Neurovascular sensation intact.  Pain management with the use of Robaxin as well as hydrocodone with good results.  Postoperative anemia stable with no bleeding episodes.  She remained on iron supplement.  Latest hemoglobin of 9.5.  Her blood pressures remained well controlled on low-dose lisinopril, which was decreased to 5 mg daily and monitored for orthostatic changes.  She would follow up with her primary MD.  The patient remained on Coumadin for DVT prophylaxis throughout her course, which were to be completed June 02, 2012. Latest INR of 2.03.  A Home Health nurse had been arranged to check INR on Tuesday, May 13, 2012.  The patient received weekly collaborative interdisciplinary team conferences to discuss estimated length of  stay, family teaching, and any barriers to her discharge.  She was able to do car transfers with supervision to a [place] as well as [place].  She was able to ambulate household functional distances with a rolling walker and supervision.  She was able to perform self-care at shower level with focus on building conference and ability to sit, stand, and transfer.  She understood all her precautions in regards to her recent hip fracture.  Full family teaching was completed.  She was to be  discharged to home with ongoing therapies dictated per rehab services.  DISCHARGE MEDICATIONS: 1. Robaxin 500 mg every 6 hours as needed spasms. 2. Hydrocodone 5/325 mg 1 or 2 tablets every 4 hours as needed for     pain. 3. Trinsicon 1 capsule twice daily. 4. Lisinopril 5 mg daily. 5. Vitamin D 50,000 units every 7 days. 6. Coumadin latest dose of 3 mg adjusted accordingly for an INR of 2.0     to 3.0 with Coumadin to be completed June 02, 2012.  DIET:  Regular.  SPECIAL PRECAUTIONS:  No driving.  Weightbearing as tolerated.  Home health nurse to check INR on Tuesday, May 13, 2012, with Coumadin to be completed June 02, 2012, for DVT prophylaxis.  FOLLOWUP:  She would follow up Dr. Selena Batten, medical management, call for appointment.  Dr. Teryl Lucy, Orthopedic Services 2 weeks, Dr. Faith Rogue at the outpatient rehab service office as needed.     Mariam Dollar, P.A.   ______________________________ Ranelle Oyster, M.D.    DA/MEDQ  D:  05/09/2012  T:  05/09/2012  Job:  [id number]  cc:   Kriste Basque, MD Eulas Post, MD

## 2012-05-09 NOTE — Progress Notes (Signed)
Occupational Therapy Session Note  Patient Details  Name: [name] MRN: [id number] Date of Birth: May 21, 1956  Today's Date: 05/09/2012 Time: 1100-1200Time Calculation (min): 60 min  1st session              1400-1445  (45 min)  2nd session              Short Term Goals: Week 1:  OT Short Term Goal 1 (Week 1): STG=LTG  Skilled Therapeutic Interventions/Progress Updates:    1st session:   1:1 self care retraining at shower level with focus on doing own set up, sit to stand, transfers (including stepping over threshold), standing balance, functional mobility. Pt. Needed multiple cues for set up and assistfor shower supplies.  Pt. Stated her sister would assist with cleaning up after her and getting things for her.       2nd session:  Addressed functional mobility with RW in kitchen to prepare simple snack.  Provided education on walker safety with opening refrigerator, cabinets, getting up/down from dining chair, and passing food from refigerator to counter top.  Pt  Was supervision with all tasks.  Daughter present.  Returned to room via RW and mod I with ambulation.  Left call bell and phone within reach.       Therapy Documentation Precautions:  Precautions Precautions: Fall Restrictions Weight Bearing Restrictions: No LLE Weight Bearing: Weight bearing as tolerated       Pain: Pain Assessment Pain Assessment: 0-10 Pain Score: 3 at rest; 8/10 during activity (1st session);              2nd session:  7/10 hip See MAR Pain Type: Surgical pain Pain Location: Hip Pain Orientation: Left Pain Descriptors: Aching Pain Onset: With Activity Pain Intervention(s):;Repositionedm rest Multiple Pain Sites: No ADL:     See FIM for current functional status  Therapy/Group: Individual Therapy  Humberto Seals 05/09/2012, 11:12 AM

## 2012-05-09 NOTE — Progress Notes (Signed)
Physical Therapy Session Note  Patient Details  Name: [name] MRN: [id number] Date of Birth: Nov 04, 1955  Today's Date: 05/09/2012 Time: 0830-0900 Time Calculation (min): 30 min  Skilled Therapeutic Interventions/Progress Updates:  Sit/stand transfers to/from recliner/RW with supervision.  Sit/stand transfers to/from RW/multiple surface heights with supervision.  Patient ambulated in controlled environment >150' with supervision.  Ambulation in home environment with RW and close supervision for safety; cuing for safe technique while ambulating sideways to left and right and while backing in RW.  Patient required brief seated rest break during ambulation.  Patient reports improved pain management, stating, "my meds are timed better today."      Seated LAQ 2x10 reps for strengthening, standing heel raises, hip abduction, HS curls, hip flexion, all x10 reps for strengthening.  Patient propelled WC back to room from gym for improved endurance.  Patient transferred from [facility] to recliner with RW and supervision, legs elevated on footrest with pillow at end of session.  Therapy Documentation Precautions:  Precautions Precautions: Fall Restrictions Weight Bearing Restrictions: No LLE Weight Bearing: Weight bearing as tolerated   Pain: Pain Assessment Pain Assessment: 0-10 Pain Score:   4 Pain Type: Surgical pain Pain Location: Hip Pain Orientation: Left Pain Descriptors: Aching Pain Onset: With Activity Pain Intervention(s): Ambulation/increased activity;Repositioned Multiple Pain Sites: No Mobility:   Locomotion : Ambulation Ambulation/Gait Assistance: 5: Supervision  Trunk/Postural Assessment :    Balance:   Exercises:   Other Treatments:    See FIM for current functional status  Therapy/Group: Individual Therapy  Rexene Agent 05/09/2012, 9:45 AM

## 2012-05-09 NOTE — Progress Notes (Signed)
ANTICOAGULATION CONSULT NOTE - Follow-Up Consult  Pharmacy Consult for Coumadin Indication: VTE prophylaxis  No Known Allergies  Labs:  Basename 05/09/12 0530 05/08/12 0606 05/07/12 0609  HGB -- -- 9.5*  HCT -- -- 28.6*  PLT -- -- 250  APTT -- -- --  LABPROT 24.2* 22.1* 21.3*  INR 2.29* 2.03* 1.93*  HEPARINUNFRC -- -- --  CREATININE -- -- 0.56  CKTOTAL -- -- --  CKMB -- -- --  TROPONINI -- -- --    The CrCl is unknown because both a height and weight (above a minimum accepted value) are required for this calculation.  Assessment: 56 yr old female, employee of [employer], was pulling a cart of beverages on the loading dock when the handle of the cart broke and she fell on her hip. S/p IM nail for L hip fracture. Pt on coumadin for VTE prophylaxis (plan for 1 mos treatment per MD note). INR just reached goal today; therapeutic at 2.29.  No bleeding noted.  Goal of Therapy:  INR 2-3   Plan:  Continue Coumadin 3 mg po daily Daily PT/INR  Okey Regal, PharmD [phone]  05/09/2012 11:57 AM

## 2012-05-09 NOTE — Progress Notes (Signed)
Subjective/Complaints: Left leg was more painful yesterday with therapy. It has improved this am. Wants to know if she needs vit d supplement. A 12 point review of systems has been performed and if not noted above is otherwise negative.   Objective: Vital Signs: Blood pressure 107/78, pulse 62, temperature 98.2 F (36.8 C), temperature source Oral, resp. rate 19, SpO2 98.00%. No results found.  Basename 05/07/12 0609  WBC 6.9  HGB 9.5*  HCT 28.6*  PLT 250    Basename 05/07/12 0609  NA 139  K 4.0  CL 102  GLUCOSE 97  BUN 14  CREATININE 0.56  CALCIUM 8.8   CBG (last 3)  No results found for this basename: GLUCAP:3 in the last 72 hours  Wt Readings from Last 3 Encounters:  05/03/12 55 kg (121 lb 4.1 oz)  05/03/12 55 kg (121 lb 4.1 oz)  04/09/12 55.339 kg (122 lb)    Physical Exam:  Constitutional: She is oriented to person, place, and time. She appears well-developed.  HENT:  Head: Normocephalic.  Eyes:  Pupils round and reactive to light  Neck: Normal range of motion. Neck supple. No thyromegaly present.  Cardiovascular: Normal rate and regular rhythm.  Pulmonary/Chest: Breath sounds normal. She has no wheezes.  Abdominal: Soft. Bowel sounds are normal. She exhibits no distension.  Musculoskeletal: She exhibits no edema.  Left hip tender appropriately. Mild swelling at hip is unchanged Neurological: She is alert and oriented to person, place, and time.  Follows full commands. Moves all 4's. UE 4+. RLE is 4/5 LLE is 1-2 prox to 4/5 distally. . No sensory deficits  Skin:  Left hip incision clean and dry with steristrips.    Assessment/Plan: 1. Functional deficits secondary to left IT hip fx which require 3+ hours per day of interdisciplinary therapy in a comprehensive inpatient rehab setting. Physiatrist is providing close team supervision and 24 hour management of active medical problems listed below. Physiatrist and rehab team continue to assess barriers to  discharge/monitor patient progress toward functional and medical goals.  Pt making progress toward goals. Should be ready for dc on Sunday,. FIM: FIM - Bathing Bathing Steps Patient Completed: Chest;Right Arm;Right lower leg (including foot);Left Arm;Abdomen;Front perineal area;Left lower leg (including foot);Buttocks;Right upper leg;Left upper leg Bathing: 6: More than reasonable amount of time  FIM - Upper Body Dressing/Undressing Upper body dressing/undressing steps patient completed: Thread/unthread left sleeve of pullover shirt/dress;Thread/unthread right sleeve of pullover shirt/dresss;Put head through opening of pull over shirt/dress;Pull shirt over trunk Upper body dressing/undressing: 5: Set-up assist to: Obtain clothing/put away FIM - Lower Body Dressing/Undressing Lower body dressing/undressing steps patient completed: Thread/unthread right underwear leg;Thread/unthread left underwear leg;Thread/unthread left pants leg;Thread/unthread right pants leg;Don/Doff right sock;Don/Doff left sock;Don/Doff right shoe;Don/Doff left shoe;Pull pants up/down;Pull underwear up/down Lower body dressing/undressing: 5: Set-up assist to: Obtain clothing  FIM - Toileting Toileting steps completed by patient: Adjust clothing prior to toileting;Performs perineal hygiene;Adjust clothing after toileting Toileting Assistive Devices: Grab bar or rail for support Toileting: 4: Steadying assist  FIM - Archivist Transfers: 5-To toilet/BSC: Supervision (verbal cues/safety issues);5-From toilet/BSC: Supervision (verbal cues/safety issues)  FIM - Bed/Chair Transfer Bed/Chair Transfer: 5: Supine > Sit: Supervision (verbal cues/safety issues);5: Sit > Supine: Supervision (verbal cues/safety issues);5: Bed > Chair or W/C: Supervision (verbal cues/safety issues);5: Chair or W/C > Bed: Supervision (verbal cues/safety issues)  FIM - Locomotion: Wheelchair Locomotion: Wheelchair: 2: Travels 50 - 149 ft  with supervision, cueing or coaxing FIM - Locomotion: Ambulation Ambulation/Gait Assistance: 4:  Min assist Locomotion: Ambulation: 5: Travels 150 ft or more with supervision/safety issues  Comprehension Comprehension Mode: Auditory Comprehension: 7-Follows complex conversation/direction: With no assist  Expression Expression Mode: Verbal Expression: 7-Expresses complex ideas: With no assist  Social Interaction Social Interaction: 6-Interacts appropriately with others with medication or extra time (anti-anxiety, antidepressant).  Problem Solving Problem Solving: 6-Solves complex problems: With extra time  Memory Memory: 6-More than reasonable amt of time  Medical Problem List and Plan:  1. Left intertrochanteric hip fracture. Status post intramedullary nail 05/02/2012  2. DVT Prophylaxis/Anticoagulation: Coumadin for DVT prophylaxis. Monitor for any bleeding episodes  3. Pain Management: Vicodin and Robaxin as needed. Encouraged patient that she will have occasional increases in pain especially as her activity increases 4. Neuropsych: This patient is capable of making decisions on his/her own behalf.  5. Postoperative anemia. Latest hemoglobin 9.5---Fe supplement. No signs of bleeding  6. Hypertension. Lisinopril 10 mg daily. Good control at present  7. Vitamin D deficiency: begin weekly supplement. Will need outpt follow up lab in about 6 weeks LOS (Days) 3 A FACE TO FACE EVALUATION WAS PERFORMED  [name] T 05/09/2012 8:12 AM

## 2012-05-09 NOTE — Discharge Summary (Signed)
Discharge summary job [id number]

## 2012-05-09 NOTE — Patient Care Conference (Signed)
Inpatient RehabilitationTeam Conference Note Date: 05/09/2012   Time: 3:34 PM    Patient Name: [name]      Medical Record Number: [id number]  Date of Birth: 1955/10/22 Sex: Female         Room/Bed: 4144/4144-01 Payor Info: Payor: [employer]  Plan: [place] [employer]  Product Type: *No Product type*     Admitting Diagnosis: HIP FX  Admit Date/Time:  05/06/2012  3:34 PM Admission Comments: No comment available   Primary Diagnosis:  Hip fracture Principal Problem: Hip fracture  Patient Active Problem List   Diagnosis Date Noted  . Hip fracture 05/06/2012  . Fracture, intertrochanteric, left femur 05/02/2012  . H/O Turner syndrome 05/02/2012  . B12 DEFICIENCY 02/09/2009  . HYPERTENSION 02/03/2009  . HEMORRHOIDS 02/03/2009  . GERD 02/03/2009  . INFLAMMATORY BOWEL DISEASE 02/03/2009  . DIVERTICULOSIS, COLON 02/03/2009  . COLONIC POLYPS, HYPERPLASTIC, HX OF 02/03/2009    Expected Discharge Date: Expected Discharge Date: 05/11/12  Team Members Present: Physician leading conference: Dr. Faith Rogue Social Worker Present: Amada Jupiter, LCSW Nurse Present: Other (comment) Milly Jakob Rcom, RN) PT Present: Reggy Eye, PT OT Present: Roney Mans, OT     Current Status/Progress Goal Weekly Team Focus  Medical   admitted for Left IT hip fx after incident at work  control pain, maximize safety, increase strength  wound care, pain mgt, monitoring of anemia   Bowel/Bladder   Patient continent of bowel/bladder; LBM 12/18  Remain continent of bowel/bladder  monitor   Swallow/Nutrition/ Hydration             ADL's   supervision to mod I   mod I  d./c planning   Mobility   mod I  mod I  d/c planning   Communication             Safety/Cognition/ Behavioral Observations            Pain   complain of pain to L hip d/t surgery; has 1-2 vicodin prn Q4hr, Robaxin 500mg  q6hr.  3 or less on scale 0-10; monitor q4hr and prn  reassess and monitor the effectiveness of  pain medications   Skin   2 incisions to L hip; steris and gauze in place.  healing of incision.no new skin breakdown or infection  monitor, keep dsg changed    Rehab Goals Patient on target to meet rehab goals: Yes *See Interdisciplinary Assessment and Plan and progress notes for long and short-term goals  Barriers to Discharge: none identified    Possible Resolutions to Barriers:  n/a    Discharge Planning/Teaching Needs:  home with family to provide 24/7 assistance if needed      Team Discussion:  Patient has made fast progress and will be ready for d/c 05/11/12.  No concerns noted.  Revisions to Treatment Plan:  None   Continued Need for Acute Rehabilitation Level of Care: The patient requires daily medical management by a physician with specialized training in physical medicine and rehabilitation for the following conditions: Daily direction of a multidisciplinary physical rehabilitation program to ensure safe treatment while eliciting the highest outcome that is of practical value to the patient.: Yes Daily medical management of patient stability for increased activity during participation in an intensive rehabilitation regime.: Yes Daily analysis of laboratory values and/or radiology reports with any subsequent need for medication adjustment of medical intervention for : Post surgical problems;Other (anemia, wound care)  [name] 05/09/2012, 3:34 PM

## 2012-05-10 ENCOUNTER — Inpatient Hospital Stay (HOSPITAL_COMMUNITY): Payer: PRIVATE HEALTH INSURANCE | Admitting: Physical Therapy

## 2012-05-10 ENCOUNTER — Inpatient Hospital Stay (HOSPITAL_COMMUNITY): Payer: PRIVATE HEALTH INSURANCE | Admitting: *Deleted

## 2012-05-10 LAB — PROTIME-INR
INR: 2.34 — ABNORMAL HIGH (ref 0.00–1.49)
Prothrombin Time: 14.1 seconds (ref 11.6–15.2)

## 2012-05-10 NOTE — Progress Notes (Signed)
ANTICOAGULATION CONSULT NOTE - Follow Up Consult  Pharmacy Consult for Coumadin Indication: VTE prophylaxis  No Known Allergies  Patient Measurements:   Heparin Dosing Weight:   Vital Signs: Temp: 97.6 F (36.4 C) (12/21 0542) Temp src: Oral (12/21 0542) BP: 104/68 mmHg (12/21 0542) Pulse Rate: 69  (12/21 0542)  Labs:  Basename 05/10/12 0931 05/10/12 0712 05/09/12 0530  HGB -- -- --  HCT -- -- --  PLT -- -- --  APTT -- -- --  LABPROT 24.6* 14.1 24.2*  INR 2.34* 1.10 2.29*  HEPARINUNFRC -- -- --  CREATININE -- -- --  CKTOTAL -- -- --  CKMB -- -- --  TROPONINI -- -- --    The CrCl is unknown because both a height and weight (above a minimum accepted value) are required for this calculation.   Medications:  Scheduled:    . ferrous fumarate-b12-vitamic C-folic acid  1 capsule Oral BID PC  . lisinopril  5 mg Oral Daily  . Vitamin D (Ergocalciferol)  50,000 Units Oral Q7 days  . warfarin  3 mg Oral q1800  . Warfarin - Pharmacist Dosing Inpatient   Does not apply q1800    Assessment: 56yo female s/p IM nail for left hip fracture, on Coumadin for VTE prophylaxis.  INR this AM stable at 2.34 on 3mg  daily.  No bleeding problems noted.  Coumadin education completed 12/14.  Plan for d/c home on 12/22.  Goal of Therapy:  INR 2-3 Monitor platelets by anticoagulation protocol: Yes   Plan:  1.  Continue current Coumadin dosing 2.  Change INR to MWF AMs only  Marisue Humble, PharmD Clinical Pharmacist [facility]

## 2012-05-10 NOTE — Progress Notes (Signed)
Subjective/Complaints: Feels well. No specific complaints She is eager to go home tomorrow  Objective: Vital Signs: Blood pressure 104/68, pulse 69, temperature 97.6 F (36.4 C), temperature source Oral, resp. rate 18, SpO2 99.00%.   Physical Exam:  NAD Chest CTA CV- reg rate Abd: + BS, soft Assessment/Plan: 1. Functional deficits secondary to left IT hip fx  Pt making progress toward goals. Should be ready for dc on Sunday,.   Medical Problem List and Plan:  1. Left intertrochanteric hip fracture. Status post intramedullary nail 05/02/2012  2. DVT Prophylaxis/Anticoagulation: Coumadin for DVT prophylaxis. Monitor for any bleeding episodes  3. Pain Management: Vicodin and Robaxin as needed. Pain has been adequately controlled 4. Neuropsych: This patient is capable of making decisions on his/her own behalf.  5. Postoperative anemia.  Lab Results  Component Value Date   HGB 9.5* 05/07/2012    6. Hypertension. Lisinopril 10 mg daily.  BP Readings from Last 3 Encounters:  05/10/12 104/68  05/06/12 92/40  05/06/12 92/40    7. Vitamin D deficiency: begin weekly supplement. Will need outpt follow up lab in about 6 weeks LOS (Days) 4 A FACE TO FACE EVALUATION WAS PERFORMED  [facility] HENRY 05/10/2012 8:55 AM

## 2012-05-10 NOTE — Progress Notes (Signed)
Physical Therapy Discharge Summary  Patient Details  Name: [name] MRN: [id number] Date of Birth: 1956-01-29  Today's Date: 05/10/2012 Time: 1615-1700 Time Calculation (min): 45 min  Patient to discharge at an ambulatory level Supervision.   Patient's care partner is independent to provide the necessary physical assistance at discharge.  Reasons goals not met: NA   Recommendation:  Patient will benefit from ongoing skilled PT services in home health setting to continue to advance safe functional mobility, address ongoing impairments in Left hip strength and home safety, and minimize fall risk.  Equipment: RW  Reasons for discharge: treatment goals met  Patient/family agrees with progress made and goals achieved: Yes  PT Discharge Precautions/Restrictions Precautions Precautions: None Restrictions Weight Bearing Restrictions: No LLE Weight Bearing: Weight bearing as tolerated Vital Signs Therapy Vitals Temp: 98 F (36.7 C) Temp src: Oral Pulse Rate: 78  Resp: 17  BP: 111/78 mmHg Patient Position, if appropriate: Sitting Oxygen Therapy SpO2: 100 % O2 Device: None (Room air) Pain Pain Assessment Pain Assessment: 0-10 Pain Score:   2 Pain Type: Surgical pain Pain Location: Hip Pain Orientation: Left;Lateral Pain Descriptors: Throbbing Patients Stated Pain Goal: 2 Pain Intervention(s): Medication (See eMAR);Repositioned Multiple Pain Sites: No Vision/Perception  Vision - History Baseline Vision: Wears glasses all the time Patient Visual Report: No change from baseline  Cognition Overall Cognitive Status: Appears within functional limits for tasks assessed Arousal/Alertness: Awake/alert Orientation Level: Oriented X4 Sensation Sensation Light Touch: Appears Intact Motor  Motor Motor: Within Functional Limits  Mobility Bed Mobility Bed Mobility: Supine to Sit;Sitting - Scoot to [place] of Bed;Sit to Supine;Scooting to [facility] Supine to Sit: 7:  Independent Sit to Supine: 7: Independent Scooting to HOB: 7: Independent Transfers Sit to Stand: 6: Modified independent (Device/Increase time) Stand to Sit: 6: Modified independent (Device/Increase time) Stand Pivot Transfers: 6: Modified independent (Device/Increase time) Locomotion  Ambulation Ambulation: Yes Ambulation/Gait Assistance: 6: Modified independent (Device/Increase time) Ambulation Distance (Feet): 150 Feet Assistive device: Rolling walker Gait Gait: Yes Gait Pattern: Step-through pattern;Decreased step length - left Stairs / Additional Locomotion Stairs: Yes Stairs Assistance: 6: Modified independent (Device/Increase time) Stair Management Technique: No rails Number of Stairs: 1  Height of Stairs: 6  Wheelchair Mobility Wheelchair Mobility: No  Trunk/Postural Assessment  Cervical Assessment Cervical Assessment: Within Functional Limits Thoracic Assessment Thoracic Assessment: Within Functional Limits Lumbar Assessment Lumbar Assessment: Within Functional Limits Postural Control Postural Control: Within Functional Limits  Balance Balance Balance Assessed: Yes Dynamic Standing Balance Dynamic Standing - Balance Support: During functional activity (patient used "golfer's lift" (L LE into hip ext) to pick up ) Dynamic Standing - Level of Assistance:  (picked up keys off floor with S/Mod-I) Extremity Assessment  RUE Assessment RUE Assessment: Within Functional Limits LUE Assessment LUE Assessment: Within Functional Limits RLE Assessment RLE Assessment: Within Functional Limits LLE Assessment LLE Assessment: Within Functional Limits (hip flexion 2+/5, hip abduction 2/5)  See FIM for current functional status  [name] J 05/10/2012, 4:57 PM

## 2012-05-10 NOTE — Progress Notes (Signed)
Occupational Therapy Note  Patient Details  Name: [name] MRN: [id number] Date of Birth: 11-30-1955 Today's Date: 05/10/2012  Time:  1445-1545  (60 min) Pain:  6/10 hip pain with movement; 4/10 with no movement Individual therapy  Engaged in functional mobility, standing balance, reaching to retrieve items out of cabinets using reacher and reiitterating the safe strategies while doing prep work in the kitchen.  Walked back to room and retrieved  Items for shower on Sunday and cleaned/straightened room.  Pt stood at sink and brushed hair.  Was fatigued at end of session.  Returned to recline with call bell in place.    Humberto Seals 05/10/2012, 3:43 PM

## 2012-05-11 NOTE — Progress Notes (Signed)
[name] left rehab in a wheelchair.with staff pushing. Sister accompany

## 2012-05-11 NOTE — Progress Notes (Signed)
2 vicodin and robaxin given at bedtime. Last 2 vicodin given at 0235. Patient reports feeling ready for discharge. [name]

## 2012-05-11 NOTE — Discharge Summary (Signed)
  Reviewed d/c summary from 12/20- no significant changes.  Pt ready for discharge

## 2012-05-12 NOTE — Progress Notes (Signed)
Social Work  Discharge Note  The overall goal for the admission was met for:   Discharge location: Yes - home with family  Length of Stay: Yes - 5 days  Discharge activity level: Yes - modified independent  Home/community participation: Yes  Services provided included: MD, RD, PT, OT, RN, TR, Pharmacy and SW  Financial Services: Worker's Comp  Follow-up services arranged: Home Health: RN, PT via [facility] and DME: youth rolling walker, 3n1 commode and tub seat via [employer]  Comments (or additional information):  Patient/Family verbalized understanding of follow-up arrangements: Yes  Individual responsible for coordination of the follow-up plan: pt  Confirmed correct DME delivered: Gary Gabrielsen 05/12/2012    Izmael Duross

## 2012-05-16 ENCOUNTER — Ambulatory Visit (INDEPENDENT_AMBULATORY_CARE_PROVIDER_SITE_OTHER): Payer: PRIVATE HEALTH INSURANCE | Admitting: Family Medicine

## 2012-05-16 ENCOUNTER — Encounter: Payer: Self-pay | Admitting: Family Medicine

## 2012-05-16 VITALS — BP 130/90 | HR 63 | Temp 98.0°F | Ht <= 58 in | Wt 131.0 lb

## 2012-05-16 DIAGNOSIS — I10 Essential (primary) hypertension: Secondary | ICD-10-CM

## 2012-05-16 DIAGNOSIS — S72009A Fracture of unspecified part of neck of unspecified femur, initial encounter for closed fracture: Secondary | ICD-10-CM

## 2012-05-16 NOTE — Progress Notes (Signed)
Occupational Therapy Discharge Summary  Patient Details  Name: Kandie Keiper MRN: [id number] Date of Birth: 1955-09-22  Today's Date: 05/16/2012 Time:  -     Patient has met 9 of 9 long term goals due to improved activity tolerance, improved balance, ability to compensate for deficits and functional use of  RIGHT lower extremity.  Patient to discharge at overall Modified Independent level.  Patient's care partner is independent to provide the necessary physical assistance at discharge.    Reasons goals not met: all goals were met  Recommendation:  Patient will benefit from ongoing skilled OT services in home health setting to continue to advance functional skills in the area of iADL.  Equipment: tub bench  Reasons for discharge: treatment goals met and discharge from hospital  Patient/family agrees with progress made and goals achieved: Yes  OT Discharge Precautions/Restrictions           ADL ADL Eating: Independent Where Assessed-Eating: Chair Grooming: Modified independent Where Assessed-Grooming: Standing at sink Upper Body Bathing: Modified independent Where Assessed-Upper Body Bathing: Shower Lower Body Bathing: Modified independent Where Assessed-Lower Body Bathing: Shower Upper Body Dressing: Modified independent (Device) Where Assessed-Upper Body Dressing: Standing at sink Lower Body Dressing: Modified independent Where Assessed-Lower Body Dressing: Sitting at sink Toileting: Modified independent Where Assessed-Toileting: Teacher, adult education: Modified Community education officer Method: Stand pivot Tub/Shower Transfer: Distant supervision Tub/Shower Transfer Method: Engineer, technical sales: Insurance underwriter: Distant supervision Film/video editor Method: Warden/ranger: Emergency planning/management officer    Extremity/Trunk Assessment:  wfl for BUE strength and AROM; trunk is wfl      See FIM for  current functional status  Humberto Seals 05/16/2012, 4:01 PM

## 2012-05-16 NOTE — Progress Notes (Signed)
Chief Complaint  Patient presents with  . Follow-up    hospital    HPI:  Here for hospital follow up for hip fx: -in hospital 12/17 - 05/11/12 then to rehab for L hip fx due to mechanical fall at work with pallet landing on her  -required nail fixation -per D/C note - coumadin prophy to continue until Jan 13/2014 and home health nurse arranged to chk INR - pt reports check yesterday and was told a little low (1.9)and they are adjusting her dose. Recheck scheduled for Monday. -last INR in comp 12/21, CBC with Hgb 9.5 (post surgical anemia improved) - pt reports energy is goo -denies: CP, falls, SOB, urinary symptoms, issues with bowels -PT at home - doing well, sister has been staying with her - now she is good to take care of herself -pt on Robaxin and hydrocodone prn -she had follow up with ortho (Dr. Lucilla Lame) on Jan 6 -her BP remained well controlled per notes -she is followed by Dr. Jarold Motto for her IBD and gyn for her osteoporosis  -report incision site good, nurse checking  ROS: See pertinent positives and negatives per HPI.  Past Medical History  Diagnosis Date  . Hypertension     off meds after wt loss  . Ulcerative colitis     hx  . Osteopenia   . GERD (gastroesophageal reflux disease)     hx-contrlled now  . Blood in stool   . Colon polyp   . IBD (inflammatory bowel disease)   . Hemorrhoids   . Diverticulosis   . Vitamin B12 deficiency   . Turner's syndrome     was on Provera and Premarin and d/c this  at age 58yr  . Fracture, intertrochanteric, left femur 05/02/2012    Family History  Problem Relation Age of Onset  . Lymphoma Mother     nhl-mantle cell  . Lymphoma Father     nhl    History   Social History  . Marital Status: Married    Spouse Name: N/A    Number of Children: N/A  . Years of Education: N/A   Social History Main Topics  . Smoking status: Never Smoker   . Smokeless tobacco: Never Used  . Alcohol Use: Yes     Comment: rare  . Drug  Use: No  . Sexually Active: None   Other Topics Concern  . 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 and lives in [place] with 2 daughters     Current outpatient prescriptions:HYDROcodone-acetaminophen (NORCO/VICODIN) 5-325 MG per tablet, Take 1-2 tablets by mouth every 4 (four) hours as needed., Disp: 90 tablet, Rfl: 0;  lisinopril (PRINIVIL,ZESTRIL) 5 MG tablet, Take 1 tablet (5 mg total) by mouth daily., Disp: 30 tablet, Rfl: 1;  methocarbamol (ROBAXIN) 500 MG tablet, Take 1 tablet (500 mg total) by mouth every 6 (six) hours as needed., Disp: 90 tablet, Rfl: 0 Multiple Vitamin (MULTIVITAMIN) tablet, Take 1 tablet by mouth daily., Disp: , Rfl: ;  Vitamin D, Ergocalciferol, (DRISDOL) 50000 UNITS CAPS, Take 1 capsule (50,000 Units total) by mouth every 7 (seven) days., Disp: 30 capsule, Rfl: 1;  warfarin (COUMADIN) 3 MG tablet, Take 1 tablet (3 mg total) by mouth daily at 6 PM., Disp: 30 tablet, Rfl: 1 ferrous fumarate-b12-vitamic C-folic acid (TRINSICON / FOLTRIN) capsule, Take 1 capsule by mouth 2 (two) times daily after a meal., Disp:  60 capsule, Rfl: 1  EXAM:  Filed Vitals:   05/16/12 0824  BP: 130/90  Pulse: 63  Temp: 98 F (36.7 C)    Body mass index is 28.35 kg/(m^2).  GENERAL: vitals reviewed and listed above, alert, oriented, appears well hydrated and in no acute distress  HEENT: atraumatic, conjunttiva clear, no obvious abnormalities on inspection of external nose and ears  NECK: no obvious masses on inspection  LUNGS: clear to auscultation bilaterally, no wheezes, rales or rhonchi, good air movement  CV: HRRR, peripheral edema L - good pulses  MS: ambulates with walker, bruising and swelling L LE - good distal pulses  PSYCH: pleasant and cooperative, no obvious depression or anxiety  ASSESSMENT AND PLAN:  Discussed the following assessment and plan:  1. HYPERTENSION   2. Hip  fracture    -patient doing well, INR being followed by ortho - has follow up with ortho in early Jan -anemia improving recent check, mild elevated LFTs in hospital - likely medication effect -feels well, will have her follow up in 1 month to recheck BP and CMP and CBC -Patient advised to return or notify a doctor immediately if symptoms worsen or persist or new concerns arise.  There are no Patient Instructions on file for this visit.   Kriste Basque R.

## 2012-05-16 NOTE — Patient Instructions (Addendum)
-  take iron daily  -try to wean off of pain medications and muscle relaxer as tolerated  -continue to be active as instructed  -follow up in 1 month or sooner if concerns

## 2012-05-20 ENCOUNTER — Telehealth: Payer: Self-pay | Admitting: Family Medicine

## 2012-05-20 NOTE — Telephone Encounter (Signed)
Ok to take tylenol if not taking the pain medications. Would advise office visit to discuss sleep issues and possible treatments.

## 2012-05-20 NOTE — Telephone Encounter (Signed)
Called and spoke with pt and pt is aware of Dr. Elmyra Ricks recommendations.  Pt states she understands.

## 2012-05-20 NOTE — Telephone Encounter (Signed)
Patient Information:  Caller Name: Shelbe  Phone: [phone]  Patient: Melissa, [name]  Gender: Female  DOB: 09/25/1955  Age: 56 Years  PCP: Kriste Basque [facility])  Pregnant: No  Office Follow Up:  Does the office need to follow up with this patient?: Yes  Instructions For The Office: Requesting medication to help with sleep. Please call her with any concerns and after med called to Pharmacy.  RN Note:  Advised she can have ES Tylenol 1-2 tabs every 6 - 8 hrs prn pain  Symptoms  Reason For Call & Symptoms: Seen in office on 05/16/12 - f/u Hospital Visit - advised wean off of pain medication. She is wondering if she can take Acetaminophen q 4-6 hrs prn.  BP 130/80. She is also requesting a sleep med be called in to [facility] on [address]. Recent family issues are causing her to worry and have trouble falling to sleep. PT going well, will be transitioning to cane next week. No triage. Medication Question.  Reviewed Health History In EMR: Yes  Reviewed Medications In EMR: Yes  Reviewed Allergies In EMR: Yes  Reviewed Surgeries / Procedures: Yes  Date of Onset of Symptoms: 05/02/2012 OB / GYN:  LMP: Unknown  Guideline(s) Used:  No Protocol Available - Information Only  Disposition Per Guideline:   Discuss with PCP and Callback by Nurse Today  Reason For Disposition Reached:   Nursing judgment  Advice Given:  Call Back If:  New symptoms develop  You become worse.

## 2012-06-04 NOTE — Telephone Encounter (Signed)
From: Mardella Layman, MD Sent: 05/23/2012 12:31 PM  To: Arline Asp, CMA  Patient's all need to be charged as there is some extenuating circumstance

## 2012-06-17 ENCOUNTER — Ambulatory Visit: Payer: PRIVATE HEALTH INSURANCE | Attending: Orthopedic Surgery

## 2012-06-17 DIAGNOSIS — M6281 Muscle weakness (generalized): Secondary | ICD-10-CM | POA: Insufficient documentation

## 2012-06-17 DIAGNOSIS — R269 Unspecified abnormalities of gait and mobility: Secondary | ICD-10-CM | POA: Insufficient documentation

## 2012-06-17 DIAGNOSIS — S8290XS Unspecified fracture of unspecified lower leg, sequela: Secondary | ICD-10-CM | POA: Insufficient documentation

## 2012-06-17 DIAGNOSIS — IMO0001 Reserved for inherently not codable concepts without codable children: Secondary | ICD-10-CM | POA: Insufficient documentation

## 2012-06-17 DIAGNOSIS — Y9210 Unspecified residential institution as the place of occurrence of the external cause: Secondary | ICD-10-CM | POA: Insufficient documentation

## 2012-06-17 DIAGNOSIS — R262 Difficulty in walking, not elsewhere classified: Secondary | ICD-10-CM | POA: Insufficient documentation

## 2012-06-17 DIAGNOSIS — W19XXXS Unspecified fall, sequela: Secondary | ICD-10-CM | POA: Insufficient documentation

## 2012-06-19 ENCOUNTER — Ambulatory Visit: Payer: PRIVATE HEALTH INSURANCE | Attending: Orthopedic Surgery | Admitting: Physical Therapy

## 2012-06-19 DIAGNOSIS — M6281 Muscle weakness (generalized): Secondary | ICD-10-CM | POA: Insufficient documentation

## 2012-06-19 DIAGNOSIS — S8290XS Unspecified fracture of unspecified lower leg, sequela: Secondary | ICD-10-CM | POA: Insufficient documentation

## 2012-06-19 DIAGNOSIS — Y9210 Unspecified residential institution as the place of occurrence of the external cause: Secondary | ICD-10-CM | POA: Insufficient documentation

## 2012-06-19 DIAGNOSIS — IMO0001 Reserved for inherently not codable concepts without codable children: Secondary | ICD-10-CM | POA: Insufficient documentation

## 2012-06-19 DIAGNOSIS — W19XXXS Unspecified fall, sequela: Secondary | ICD-10-CM | POA: Insufficient documentation

## 2012-06-19 DIAGNOSIS — R262 Difficulty in walking, not elsewhere classified: Secondary | ICD-10-CM | POA: Insufficient documentation

## 2012-06-19 DIAGNOSIS — R269 Unspecified abnormalities of gait and mobility: Secondary | ICD-10-CM | POA: Insufficient documentation

## 2012-06-20 ENCOUNTER — Ambulatory Visit (INDEPENDENT_AMBULATORY_CARE_PROVIDER_SITE_OTHER): Payer: 59 | Admitting: Family Medicine

## 2012-06-20 ENCOUNTER — Encounter: Payer: Self-pay | Admitting: Family Medicine

## 2012-06-20 VITALS — BP 120/68 | HR 86 | Temp 98.1°F | Wt 124.0 lb

## 2012-06-20 DIAGNOSIS — M949 Disorder of cartilage, unspecified: Secondary | ICD-10-CM

## 2012-06-20 DIAGNOSIS — I10 Essential (primary) hypertension: Secondary | ICD-10-CM

## 2012-06-20 DIAGNOSIS — G4700 Insomnia, unspecified: Secondary | ICD-10-CM

## 2012-06-20 DIAGNOSIS — M8580 Other specified disorders of bone density and structure, unspecified site: Secondary | ICD-10-CM

## 2012-06-20 DIAGNOSIS — E785 Hyperlipidemia, unspecified: Secondary | ICD-10-CM

## 2012-06-20 DIAGNOSIS — K219 Gastro-esophageal reflux disease without esophagitis: Secondary | ICD-10-CM

## 2012-06-20 HISTORY — DX: Hyperlipidemia, unspecified: E78.5

## 2012-06-20 NOTE — Patient Instructions (Signed)
-  please reschedule your colonoscopy in a few months and let Dr. Jarold Motto know that you are not taking the [place]  -Talk to Dr. Ernestina Penna about your osteoporosis  For your sleep: -keep very cooler, dark, quiet -reserve bed only for sleeping  -if you get into bed and are tossing and turning and worrying for more then 15 minutes - get up journal thoughts and do a quiet activity and then go back to bed, repeat as many times as needed -if needed a few times per week try tylenol pm or 1/2 to 1 of the norco  Follow up in about 2 months

## 2012-06-20 NOTE — Progress Notes (Signed)
Chief Complaint  Patient presents with  . 1 month follow up    HPI:  Ms. [name] is here for follow up:  Patient Active Problem List  Diagnosis  . HYPERTENSION/HLD: -stable on lisinopril and was working on diet and exercise until recent hip fx -labs last checked 02/2011 for cholesterol  . GERD -followed by GI, stable  . INFLAMMATORY BOWEL DISEASE - Followed by Dr. Jarold Motto in GI -per last notes reviewed is supposed to be on Canasa and was supposed to have a colonoscopy in December - cancelled by pt  . COLONIC POLYPS, HYPERPLASTIC, HX OF - followed closely by GI  . Hip fracture - 04/2012, followe by Dr. Lucilla Lame in Ortho -she is in outpatient PT and this is going well -pain is well controlled, only using tylenol  . Osteopenia/?Osteoporosis - followed by her gynecologist -she will call Dr. Ernestina Penna for an appointment to discuss tx for her osteoporosis, was on actonel, but told to stop this in the hospital  Sleep issues: -having trouble with younger daughter the last few weeks -worried about this and her job with her hip -having trouble falling asleep and getting back to sleep -she thinks this is more related to the mind wondering and worrying then pain, but pain sometimes, not using the norco currently  ROS: See pertinent positives and negatives per HPI.  Past Medical History  Diagnosis Date  . Hypertension   . Ulcerative colitis   . Osteopenia   . GERD (gastroesophageal reflux disease)   . Blood in stool   . Colon polyp   . Hemorrhoids   . Diverticulosis   . Vitamin B12 deficiency   . Turner's syndrome     was on Provera and Premarin and d/c this  at age 64yr  . Fracture, intertrochanteric, left femur 05/02/2012  . DIVERTICULOSIS, COLON 02/03/2009    Qualifier: Diagnosis of  By: Elpidio Eric RN, Lupita Leash    . HEMORRHOIDS 02/03/2009    Qualifier: Diagnosis of  By: Elpidio Eric RN, Lupita Leash    . INFLAMMATORY BOWEL DISEASE - Followed by Dr. Jarold Motto in GI 02/03/2009    Qualifier: Diagnosis  of  By: Elpidio Eric RN, Lupita Leash    . COLONIC POLYPS, HYPERPLASTIC, HX OF 02/03/2009    Qualifier: Diagnosis of  By: Surface RN, Lupita Leash    . B12 DEFICIENCY 02/09/2009    Qualifier: Diagnosis of  By: Yetta Barre RN, CGRN, Sheri    . Dyslipidemia 06/20/2012    Family History  Problem Relation Age of Onset  . Lymphoma Mother     nhl-mantle cell  . Lymphoma Father     nhl    History   Social History  . Marital Status: Married    Spouse Name: N/A    Number of Children: N/A  . Years of Education: N/A   Social History Main Topics  . Smoking status: Never Smoker   . Smokeless tobacco: Never Used  . Alcohol Use: Yes     Comment: rare  . Drug Use: No  . Sexually Active: None   Other Topics Concern  . 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 lives in [place] with 2 daughters     Current outpatient prescriptions:lisinopril (PRINIVIL,ZESTRIL) 5 MG tablet, Take 1 tablet (5 mg total) by mouth daily., Disp: 30 tablet, Rfl: 1;  Multiple Vitamin (MULTIVITAMIN) tablet, Take 1 tablet by mouth daily., Disp: , Rfl: ;  Vitamin D, Ergocalciferol, (DRISDOL) 50000 UNITS CAPS, Take 1 capsule (50,000 Units total) by mouth every 7 (seven) days., Disp: 30 capsule, Rfl: 1 methocarbamol (ROBAXIN) 500 MG tablet, Take 1 tablet (500 mg total) by mouth every 6 (six) hours as needed., Disp: 90 tablet, Rfl: 0  EXAM:  Filed Vitals:   06/20/12 1007  BP: 120/68  Pulse: 86  Temp: 98.1 F (36.7 C)    There is no height on file to calculate BMI.  GENERAL: vitals reviewed and listed above, alert, oriented, appears well hydrated and in no acute distress  HEENT: atraumatic, conjunttiva clear, no obvious abnormalities on inspection of external nose and ears  NECK: no obvious masses on inspection  LUNGS: clear to auscultation bilaterally, no wheezes, rales or rhonchi, good air movement  CV: HRRR, L  LE peripheral edema  MS: moves  all extremities without noticeable abnormality - walks with cane  PSYCH: pleasant and cooperative, no obvious depression or anxiety  ASSESSMENT AND PLAN:  Discussed the following assessment and plan:  1. Osteopenia/?Osteoporosis - followed by her gynecologist   2. Dyslipidemia   3. Insomnia   4. HYPERTENSION   5. GERD   -also offered counseling for sleep/anxiety - pt will wait on this -pt wants to talk to Dr. Ernestina Penna about her osteoporosis - taking Vit D and calcium -she will talk about colonoscopy with Dr. Jarold Motto -Patient advised to return or notify a doctor immediately if symptoms worsen or persist or new concerns arise.  Patient Instructions  -please reschedule your colonoscopy in a few months and let Dr. Jarold Motto know that you are not taking the Canasa  -Talk to Dr. Ernestina Penna about your osteoporosis  For your sleep: -keep very cooler, dark, quiet -reserve bed only for sleeping  -if you get into bed and are tossing and turning and worrying for more then 15 minutes - get up journal thoughts and do a quiet activity and then go back to bed, repeat as many times as needed -if needed a few times per week try tylenol pm or 1/2 to 1 of the norco  Follow up in about 2 months         KIM, HANNAH R.

## 2012-06-23 ENCOUNTER — Ambulatory Visit: Payer: PRIVATE HEALTH INSURANCE | Attending: Orthopedic Surgery

## 2012-06-23 DIAGNOSIS — IMO0001 Reserved for inherently not codable concepts without codable children: Secondary | ICD-10-CM | POA: Insufficient documentation

## 2012-06-23 DIAGNOSIS — R262 Difficulty in walking, not elsewhere classified: Secondary | ICD-10-CM | POA: Insufficient documentation

## 2012-06-23 DIAGNOSIS — R269 Unspecified abnormalities of gait and mobility: Secondary | ICD-10-CM | POA: Insufficient documentation

## 2012-06-23 DIAGNOSIS — M6281 Muscle weakness (generalized): Secondary | ICD-10-CM | POA: Insufficient documentation

## 2012-06-25 ENCOUNTER — Encounter: Payer: Self-pay | Admitting: Physical Therapy

## 2012-06-27 ENCOUNTER — Ambulatory Visit: Payer: PRIVATE HEALTH INSURANCE | Admitting: Physical Therapy

## 2012-06-30 ENCOUNTER — Ambulatory Visit: Payer: PRIVATE HEALTH INSURANCE | Admitting: Physical Therapy

## 2012-07-02 ENCOUNTER — Ambulatory Visit: Payer: PRIVATE HEALTH INSURANCE | Admitting: Physical Therapy

## 2012-07-05 ENCOUNTER — Other Ambulatory Visit: Payer: Self-pay

## 2012-07-07 ENCOUNTER — Ambulatory Visit: Payer: PRIVATE HEALTH INSURANCE | Admitting: Physical Therapy

## 2012-07-09 ENCOUNTER — Ambulatory Visit: Payer: 59 | Admitting: Family Medicine

## 2012-07-09 ENCOUNTER — Ambulatory Visit: Payer: PRIVATE HEALTH INSURANCE | Attending: Orthopedic Surgery

## 2012-07-09 DIAGNOSIS — R269 Unspecified abnormalities of gait and mobility: Secondary | ICD-10-CM | POA: Insufficient documentation

## 2012-07-09 DIAGNOSIS — IMO0001 Reserved for inherently not codable concepts without codable children: Secondary | ICD-10-CM | POA: Insufficient documentation

## 2012-07-09 DIAGNOSIS — R262 Difficulty in walking, not elsewhere classified: Secondary | ICD-10-CM | POA: Insufficient documentation

## 2012-07-09 DIAGNOSIS — M6281 Muscle weakness (generalized): Secondary | ICD-10-CM | POA: Insufficient documentation

## 2012-07-11 ENCOUNTER — Other Ambulatory Visit: Payer: Self-pay | Admitting: Obstetrics

## 2012-07-14 ENCOUNTER — Ambulatory Visit: Payer: PRIVATE HEALTH INSURANCE

## 2012-07-16 ENCOUNTER — Ambulatory Visit: Payer: PRIVATE HEALTH INSURANCE | Admitting: Physical Therapy

## 2012-07-17 ENCOUNTER — Ambulatory Visit
Admission: RE | Admit: 2012-07-17 | Discharge: 2012-07-17 | Disposition: A | Discharge status: Home / Self Care | Payer: 59 | Source: Ambulatory Visit | Attending: Obstetrics | Admitting: Obstetrics

## 2012-07-17 DIAGNOSIS — R6252 Short stature (child): Secondary | ICD-10-CM

## 2012-07-17 DIAGNOSIS — M810 Age-related osteoporosis without current pathological fracture: Secondary | ICD-10-CM

## 2012-07-21 ENCOUNTER — Ambulatory Visit: Payer: PRIVATE HEALTH INSURANCE | Attending: Orthopedic Surgery | Admitting: Physical Therapy

## 2012-07-21 DIAGNOSIS — IMO0001 Reserved for inherently not codable concepts without codable children: Secondary | ICD-10-CM | POA: Insufficient documentation

## 2012-07-21 DIAGNOSIS — R269 Unspecified abnormalities of gait and mobility: Secondary | ICD-10-CM | POA: Insufficient documentation

## 2012-07-21 DIAGNOSIS — M6281 Muscle weakness (generalized): Secondary | ICD-10-CM | POA: Insufficient documentation

## 2012-07-21 DIAGNOSIS — R262 Difficulty in walking, not elsewhere classified: Secondary | ICD-10-CM | POA: Insufficient documentation

## 2012-07-22 ENCOUNTER — Telehealth: Payer: Self-pay | Admitting: Family Medicine

## 2012-07-22 NOTE — Telephone Encounter (Signed)
Patient called stating that she need a refill of her lisinopril 5mg  1poqd sent to [facility]. Please assist.

## 2012-07-23 ENCOUNTER — Ambulatory Visit: Payer: PRIVATE HEALTH INSURANCE | Admitting: Physical Therapy

## 2012-07-23 MED ORDER — LISINOPRIL 5 MG PO TABS: 5.0000 mg | ORAL_TABLET | Freq: Every day | ORAL | Status: DC

## 2012-07-23 NOTE — Telephone Encounter (Signed)
Rx sent to pharmacy   

## 2012-07-29 ENCOUNTER — Ambulatory Visit: Payer: PRIVATE HEALTH INSURANCE

## 2012-07-30 ENCOUNTER — Ambulatory Visit: Payer: PRIVATE HEALTH INSURANCE | Admitting: Physical Therapy

## 2012-07-31 ENCOUNTER — Ambulatory Visit: Payer: PRIVATE HEALTH INSURANCE | Admitting: Physical Therapy

## 2012-08-05 ENCOUNTER — Ambulatory Visit: Payer: PRIVATE HEALTH INSURANCE

## 2012-08-07 ENCOUNTER — Ambulatory Visit: Payer: PRIVATE HEALTH INSURANCE | Admitting: Physical Therapy

## 2012-08-11 ENCOUNTER — Ambulatory Visit: Payer: PRIVATE HEALTH INSURANCE | Admitting: Physical Therapy

## 2012-08-13 ENCOUNTER — Ambulatory Visit: Payer: PRIVATE HEALTH INSURANCE | Admitting: Physical Therapy

## 2012-08-18 ENCOUNTER — Ambulatory Visit: Payer: PRIVATE HEALTH INSURANCE

## 2012-08-20 ENCOUNTER — Ambulatory Visit: Payer: PRIVATE HEALTH INSURANCE | Attending: Orthopedic Surgery | Admitting: Physical Therapy

## 2012-08-20 DIAGNOSIS — R269 Unspecified abnormalities of gait and mobility: Secondary | ICD-10-CM | POA: Insufficient documentation

## 2012-08-20 DIAGNOSIS — R262 Difficulty in walking, not elsewhere classified: Secondary | ICD-10-CM | POA: Insufficient documentation

## 2012-08-20 DIAGNOSIS — IMO0001 Reserved for inherently not codable concepts without codable children: Secondary | ICD-10-CM | POA: Insufficient documentation

## 2012-08-20 DIAGNOSIS — M6281 Muscle weakness (generalized): Secondary | ICD-10-CM | POA: Insufficient documentation

## 2012-08-21 ENCOUNTER — Ambulatory Visit (INDEPENDENT_AMBULATORY_CARE_PROVIDER_SITE_OTHER): Payer: 59 | Admitting: Family Medicine

## 2012-08-21 ENCOUNTER — Encounter: Payer: Self-pay | Admitting: Family Medicine

## 2012-08-21 VITALS — BP 130/78 | HR 72 | Temp 98.2°F | Wt 129.0 lb

## 2012-08-21 DIAGNOSIS — I10 Essential (primary) hypertension: Secondary | ICD-10-CM

## 2012-08-21 DIAGNOSIS — E785 Hyperlipidemia, unspecified: Secondary | ICD-10-CM

## 2012-08-21 DIAGNOSIS — M899 Disorder of bone, unspecified: Secondary | ICD-10-CM

## 2012-08-21 DIAGNOSIS — M8580 Other specified disorders of bone density and structure, unspecified site: Secondary | ICD-10-CM

## 2012-08-21 DIAGNOSIS — K5289 Other specified noninfective gastroenteritis and colitis: Secondary | ICD-10-CM

## 2012-08-21 MED ORDER — LISINOPRIL 5 MG PO TABS: 5.0000 mg | ORAL_TABLET | Freq: Every day | ORAL | Status: DC

## 2012-08-21 NOTE — Progress Notes (Signed)
Chief Complaint  Patient presents with  . 3 month follow up    HPI:  HTN/HLD: -on lisinopril, working on lifestyle recs Lab Results  Component Value Date   CHOL 221* 03/03/2012   HDL 81.00 03/03/2012   LDLDIRECT 135.6 03/03/2012   TRIG 79.0 03/03/2012   CHOLHDL 3 03/03/2012  -denies: CP, SOB, HA, vision changes -reports had labs, kidney function and everything with Dr. Payton Doughty 1 month ago  Osteoporosis: -had a dexa with her gynecologist and is going to start prolia in June -labs were checked with gyn and she saw a dentist  Hip fx: -improving great -back to work -not on any pain medications anymore and sleep is better, PT 2x per week  IBD: -followed by GI -she will schedule follow up, no symptoms, off canasa  ROS: See pertinent positives and negatives per HPI.  Past Medical History  Diagnosis Date  . Hypertension   . Ulcerative colitis   . Osteopenia   . GERD (gastroesophageal reflux disease)   . Blood in stool   . Colon polyp   . Hemorrhoids   . Diverticulosis   . Vitamin B12 deficiency   . Turner's syndrome     was on Provera and Premarin and d/c this  at age 14yr  . Fracture, intertrochanteric, left femur 05/02/2012  . DIVERTICULOSIS, COLON 02/03/2009    Qualifier: Diagnosis of  By: Elpidio Eric RN, Lupita Leash    . HEMORRHOIDS 02/03/2009    Qualifier: Diagnosis of  By: Elpidio Eric RN, Lupita Leash    . INFLAMMATORY BOWEL DISEASE - Followed by Dr. Jarold Motto in GI 02/03/2009    Qualifier: Diagnosis of  By: Elpidio Eric RN, Lupita Leash    . COLONIC POLYPS, HYPERPLASTIC, HX OF 02/03/2009    Qualifier: Diagnosis of  By: Surface RN, Lupita Leash    . B12 DEFICIENCY 02/09/2009    Qualifier: Diagnosis of  By: Yetta Barre RN, CGRN, Sheri    . Dyslipidemia 06/20/2012    Family History  Problem Relation Age of Onset  . Lymphoma Mother     nhl-mantle cell  . Lymphoma Father     nhl    History   Social History  . Marital Status: Married    Spouse Name: N/A    Number of Children: N/A  . Years of  Education: N/A   Social History Main Topics  . Smoking status: Never Smoker   . Smokeless tobacco: Never Used  . Alcohol Use: Yes     Comment: rare  . Drug Use: No  . Sexually Active: None   Other Topics Concern  . 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  GENERAL: vitals reviewed and listed above, alert, oriented, appears well hydrated and in no acute distress  HEENT: atraumatic, conjunttiva clear,  no obvious abnormalities on inspection of external nose and ears  NECK: no obvious masses on inspection  LUNGS: clear to auscultation bilaterally, no wheezes, rales or rhonchi, good air movement  CV: HRRR, LLEedema  MS: moves all extremities without noticeable abnormality  PSYCH: pleasant and cooperative, no obvious depression or anxiety  ASSESSMENT AND PLAN:  Discussed the following assessment and plan:  HYPERTENSION - Plan: Basic metabolic panel, lisinopril (PRINIVIL,ZESTRIL) 5 MG tablet  INFLAMMATORY BOWEL DISEASE - Followed by Dr. Jarold Motto in GI  Osteopenia/?Osteoporosis - followed by her gynecologist, Dr. Ernestina Penna  Dyslipidemia - Plan: Lipid Panel   -continue lisinopril -she will follow up with Dr. Jarold Motto to let him know she is off the [facility] -will follow up for fasting labs -elevate legs at night and use  compression -follow up 4-6 months -Patient advised to return or notify a doctor immediately if symptoms worsen or persist or new concerns arise.  Patient Instructions  -call you gastroenterologist for follow up  -schedule lab appointment for fasting labs on Monday  -follow up with me in 4-6 months     KIM, HANNAH R.

## 2012-08-21 NOTE — Patient Instructions (Addendum)
-  call you gastroenterologist for follow up  -schedule lab appointment for fasting labs on Monday  -follow up with me in 4-6 months

## 2012-08-25 ENCOUNTER — Other Ambulatory Visit: Payer: Self-pay

## 2012-08-25 ENCOUNTER — Ambulatory Visit: Payer: PRIVATE HEALTH INSURANCE

## 2012-08-27 ENCOUNTER — Other Ambulatory Visit: Payer: Self-pay

## 2012-08-27 ENCOUNTER — Ambulatory Visit: Payer: PRIVATE HEALTH INSURANCE | Admitting: Physical Therapy

## 2012-09-01 ENCOUNTER — Ambulatory Visit: Payer: PRIVATE HEALTH INSURANCE | Admitting: Physical Therapy

## 2012-09-03 ENCOUNTER — Ambulatory Visit: Payer: PRIVATE HEALTH INSURANCE

## 2012-09-08 ENCOUNTER — Ambulatory Visit: Payer: PRIVATE HEALTH INSURANCE

## 2012-09-10 ENCOUNTER — Other Ambulatory Visit (INDEPENDENT_AMBULATORY_CARE_PROVIDER_SITE_OTHER): Payer: 59

## 2012-09-10 ENCOUNTER — Ambulatory Visit: Payer: PRIVATE HEALTH INSURANCE | Admitting: Physical Therapy

## 2012-09-10 DIAGNOSIS — E785 Hyperlipidemia, unspecified: Secondary | ICD-10-CM

## 2012-09-10 DIAGNOSIS — I10 Essential (primary) hypertension: Secondary | ICD-10-CM

## 2012-09-10 LAB — LDL CHOLESTEROL, DIRECT: Direct LDL: 157.8 mg/dL

## 2012-09-10 LAB — BASIC METABOLIC PANEL
BUN: 13 mg/dL (ref 6–23)
Calcium: 9.4 mg/dL (ref 8.4–10.5)
Chloride: 105 mEq/L (ref 96–112)
Creatinine, Ser: 0.6 mg/dL (ref 0.4–1.2)

## 2012-09-10 LAB — LIPID PANEL
Cholesterol: 235 mg/dL — ABNORMAL HIGH (ref 0–200)
Triglycerides: 109 mg/dL (ref 0.0–149.0)
VLDL: 21.8 mg/dL (ref 0.0–40.0)

## 2012-09-11 ENCOUNTER — Telehealth: Payer: Self-pay | Admitting: Family Medicine

## 2012-09-11 NOTE — Telephone Encounter (Signed)
Called and spoke with pt and pt is aware.  

## 2012-09-11 NOTE — Telephone Encounter (Signed)
Cholesterol is borderline high. Advise working on a healthy diet and as much activity as possible. Will recheck at follow up and may need to consider medicine in the future. Other labs ok.

## 2012-09-15 ENCOUNTER — Ambulatory Visit: Payer: PRIVATE HEALTH INSURANCE | Admitting: Physical Therapy

## 2012-09-18 ENCOUNTER — Ambulatory Visit: Payer: PRIVATE HEALTH INSURANCE | Attending: Orthopedic Surgery

## 2012-09-18 DIAGNOSIS — M6281 Muscle weakness (generalized): Secondary | ICD-10-CM | POA: Insufficient documentation

## 2012-09-18 DIAGNOSIS — IMO0001 Reserved for inherently not codable concepts without codable children: Secondary | ICD-10-CM | POA: Insufficient documentation

## 2012-09-18 DIAGNOSIS — R269 Unspecified abnormalities of gait and mobility: Secondary | ICD-10-CM | POA: Insufficient documentation

## 2012-09-18 DIAGNOSIS — R262 Difficulty in walking, not elsewhere classified: Secondary | ICD-10-CM | POA: Insufficient documentation

## 2012-09-22 ENCOUNTER — Ambulatory Visit: Payer: PRIVATE HEALTH INSURANCE | Admitting: Physical Therapy

## 2012-09-24 ENCOUNTER — Ambulatory Visit: Payer: PRIVATE HEALTH INSURANCE

## 2012-11-25 ENCOUNTER — Encounter: Payer: Self-pay | Admitting: Gastroenterology

## 2013-02-17 ENCOUNTER — Ambulatory Visit (AMBULATORY_SURGERY_CENTER): Payer: Self-pay

## 2013-02-17 VITALS — Ht <= 58 in | Wt 126.8 lb

## 2013-02-17 DIAGNOSIS — Z860100 Personal history of colon polyps, unspecified: Secondary | ICD-10-CM

## 2013-02-17 MED ORDER — MOVIPREP 100 G PO SOLR: ORAL | Status: DC

## 2013-03-02 ENCOUNTER — Encounter: Payer: Self-pay | Admitting: Gastroenterology

## 2013-03-25 ENCOUNTER — Ambulatory Visit (AMBULATORY_SURGERY_CENTER): Payer: 59 | Admitting: Gastroenterology

## 2013-03-25 ENCOUNTER — Encounter: Payer: Self-pay | Admitting: Gastroenterology

## 2013-03-25 VITALS — BP 129/76 | HR 56 | Temp 97.9°F | Resp 29 | Ht <= 58 in | Wt 126.0 lb

## 2013-03-25 DIAGNOSIS — D126 Benign neoplasm of colon, unspecified: Secondary | ICD-10-CM

## 2013-03-25 DIAGNOSIS — Z860100 Personal history of colon polyps, unspecified: Secondary | ICD-10-CM

## 2013-03-25 DIAGNOSIS — K5730 Diverticulosis of large intestine without perforation or abscess without bleeding: Secondary | ICD-10-CM

## 2013-03-25 DIAGNOSIS — K5010 Crohn's disease of large intestine without complications: Secondary | ICD-10-CM

## 2013-03-25 MED ORDER — SODIUM CHLORIDE 0.9 % IV SOLN: 500.0000 mL | INTRAVENOUS | Status: DC

## 2013-03-25 NOTE — Progress Notes (Signed)
  [facility] Anesthesia Post-op Note  Patient: [name]  Procedure(s) Performed: colonoscopy  Patient Location: [facility] - Recovery Area  Anesthesia Type: Deep Sedation/Propofol  Level of Consciousness: awake, oriented and patient cooperative  Airway and Oxygen Therapy: Patient Spontanous Breathing  Post-op Pain: none  Post-op Assessment:  Post-op Vital signs reviewed, Patient's Cardiovascular Status Stable, Respiratory Function Stable, Patent Airway, No signs of Nausea or vomiting and Pain level controlled  Post-op Vital Signs: Reviewed and stable  Complications: No apparent anesthesia complications  Praise Dolecki E 9:00 AM

## 2013-03-25 NOTE — Op Note (Signed)
[facility] [address] [place], [address]   COLONOSCOPY PROCEDURE REPORT  PATIENT: Lorriane, Dehart  MR#: [id number] BIRTHDATE: 09-Apr-1956 , 57  yrs. old GENDER: Female ENDOSCOPIST: Mardella Layman, MD, [facility] REFERRED BY: PROCEDURE DATE:  03/25/2013 PROCEDURE:   Colonoscopy with biopsy First Screening Colonoscopy - Avg.  risk and is 50 yrs.  old or older - No.  Prior Negative Screening - Now for repeat screening. N/A  History of Adenoma - Now for follow-up colonoscopy & has been > or = to 3 yrs.  N/A  Polyps Removed Today? Yes. ASA CLASS:   Class II INDICATIONS:Patient's personal history of adenomatous colon polyps.  MEDICATIONS: propofol (Diprivan) 200mg  IV  DESCRIPTION OF PROCEDURE:   After the risks benefits and alternatives of the procedure were thoroughly explained, informed consent was obtained.  A digital rectal exam revealed no abnormalities of the rectum.   The LB [id number]  endoscope was introduced through the anus and advanced to the cecum, which was identified by both the appendix and ileocecal valve. No adverse events experienced.   The quality of the prep was good, using MoviPrep  The instrument was then slowly withdrawn as the colon was fully examined.      COLON FINDINGS: There was moderate diverticulosis noted in the descending colon and sigmoid colon with associated muscular hypertrophy, colonic narrowing, petechiae and inflammatory changes.Scattered polyps removed in left and right colon...small 3-5 mm polyps.Scattered colitis in entire colon,most marked in sigmoid area with diverticulosis,  Retroflexed views revealed no abnormalities. The time to cecum=30minutes 20 seconds.  Withdrawal time=11.26 mts.     .  The scope was withdrawn and the procedure completed. COMPLICATIONS: There were no complications.  ENDOSCOPIC IMPRESSION:  There was moderate diverticulosis noted in the descending colon and sigmoid  colon  RECOMMENDATIONS: 1.  Await pathology results 2.  Repeat colonoscopy in 5 years if polyp adenomatous; otherwise 10 years 3. Patient refuses aminosalicylate RX.Marland KitchenMarland KitchenI suspect these are pseudopolyps withchronic UC here.  eSigned:  Mardella Layman, MD, [facility] 03/25/2013 9:05 AM   cc: Pearson Grippe, MD   PATIENT NAME:  Jayra, Choyce MR#: [id number]

## 2013-03-25 NOTE — Progress Notes (Signed)
Called to the procedure room after the exam was completed.  Frutoso Chase, CMA told me where the specimens were obtained from. Maw

## 2013-03-25 NOTE — Patient Instructions (Signed)
Discharge instructions given with verbal understanding. Handouts on polyps and diverticulosis. Resume previous medications. YOU HAD AN ENDOSCOPIC PROCEDURE TODAY AT THE [facility]: Refer to the procedure report that was given to you for any specific questions about what was found during the examination.  If the procedure report does not answer your questions, please call your gastroenterologist to clarify.  If you requested that your care partner not be given the details of your procedure findings, then the procedure report has been included in a sealed envelope for you to review at your convenience later.  YOU SHOULD EXPECT: Some feelings of bloating in the abdomen. Passage of more gas than usual.  Walking can help get rid of the air that was put into your GI tract during the procedure and reduce the bloating. If you had a lower endoscopy (such as a colonoscopy or flexible sigmoidoscopy) you may notice spotting of blood in your stool or on the toilet paper. If you underwent a bowel prep for your procedure, then you may not have a normal bowel movement for a few days.  DIET: Your first meal following the procedure should be a light meal and then it is ok to progress to your normal diet.  A half-sandwich or bowl of soup is an example of a good first meal.  Heavy or fried foods are harder to digest and may make you feel nauseous or bloated.  Likewise meals heavy in dairy and vegetables can cause extra gas to form and this can also increase the bloating.  Drink plenty of fluids but you should avoid alcoholic beverages for 24 hours.  ACTIVITY: Your care partner should take you home directly after the procedure.  You should plan to take it easy, moving slowly for the rest of the day.  You can resume normal activity the day after the procedure however you should NOT DRIVE or use heavy machinery for 24 hours (because of the sedation medicines used during the test).    SYMPTOMS TO REPORT  IMMEDIATELY: A gastroenterologist can be reached at any hour.  During normal business hours, 8:30 AM to 5:00 PM Monday through Friday, call [phone].  After hours and on weekends, please call the GI answering service at [phone] who will take a message and have the physician on call contact you.   Following lower endoscopy (colonoscopy or flexible sigmoidoscopy):  Excessive amounts of blood in the stool  Significant tenderness or worsening of abdominal pains  Swelling of the abdomen that is new, acute  Fever of 100F or higher  FOLLOW UP: If any biopsies were taken you will be contacted by phone or by letter within the next 1-3 weeks.  Call your gastroenterologist if you have not heard about the biopsies in 3 weeks.  Our staff will call the home number listed on your records the next business day following your procedure to check on you and address any questions or concerns that you may have at that time regarding the information given to you following your procedure. This is a courtesy call and so if there is no answer at the home number and we have not heard from you through the emergency physician on call, we will assume that you have returned to your regular daily activities without incident.  SIGNATURES/CONFIDENTIALITY: You and/or your care partner have signed paperwork which will be entered into your electronic medical record.  These signatures attest to the fact that that the information above on your After Visit Summary   has been reviewed and is understood.  Full responsibility of the confidentiality of this discharge information lies with you and/or your care-partner. 

## 2013-03-25 NOTE — Progress Notes (Signed)
Patient did not experience any of the following events: a burn prior to discharge; a fall within the facility; wrong site/side/patient/procedure/implant event; or a hospital transfer or hospital admission upon discharge from the facility. (G8907) Patient did not have preoperative order for IV antibiotic SSI prophylaxis. ([id number])  

## 2013-03-26 ENCOUNTER — Other Ambulatory Visit: Payer: Self-pay

## 2013-03-26 ENCOUNTER — Telehealth: Payer: Self-pay

## 2013-03-26 NOTE — Telephone Encounter (Signed)
Left a message at [phone] for the pt to call us back if she has any questions or concerns. Maw

## 2013-03-30 ENCOUNTER — Encounter: Payer: Self-pay | Admitting: Gastroenterology

## 2013-05-16 ENCOUNTER — Ambulatory Visit (INDEPENDENT_AMBULATORY_CARE_PROVIDER_SITE_OTHER): Payer: 59 | Admitting: Internal Medicine

## 2013-05-16 ENCOUNTER — Ambulatory Visit: Payer: 59

## 2013-05-16 VITALS — BP 122/72 | HR 103 | Temp 102.7°F | Resp 16 | Ht <= 58 in | Wt 121.8 lb

## 2013-05-16 DIAGNOSIS — R05 Cough: Secondary | ICD-10-CM

## 2013-05-16 DIAGNOSIS — J189 Pneumonia, unspecified organism: Secondary | ICD-10-CM

## 2013-05-16 DIAGNOSIS — R509 Fever, unspecified: Secondary | ICD-10-CM

## 2013-05-16 LAB — POCT CBC
Granulocyte percent: 80.7 %G — AB (ref 37–80)
HCT, POC: 42 % (ref 37.7–47.9)
Hemoglobin: 12.9 g/dL (ref 12.2–16.2)
Lymph, poc: 0.8 (ref 0.6–3.4)
MCH, POC: 29.5 pg (ref 27–31.2)
MCHC: 30.7 g/dL — AB (ref 31.8–35.4)
WBC: 6.2 10*3/uL (ref 4.6–10.2)

## 2013-05-16 LAB — POCT INFLUENZA A/B
Influenza A, POC: NEGATIVE
Influenza B, POC: NEGATIVE

## 2013-05-16 MED ORDER — LEVOFLOXACIN 500 MG PO TABS: 500.0000 mg | ORAL_TABLET | Freq: Every day | ORAL | Status: DC

## 2013-05-16 MED ORDER — HYDROCODONE-HOMATROPINE 5-1.5 MG/5ML PO SYRP: 5.0000 mL | ORAL_SOLUTION | Freq: Four times a day (QID) | ORAL | Status: DC | PRN

## 2013-05-16 MED ORDER — CEFTRIAXONE SODIUM 1 G IJ SOLR
1.0000 g | Freq: Once | INTRAMUSCULAR | Status: AC
  Administered 2013-05-16: 1 g via INTRAMUSCULAR

## 2013-05-16 NOTE — Progress Notes (Signed)
Subjective:  This chart was scribed for Ellamae Sia, MD by Carl Best, Medical Scribe. This patient was seen in Room 12 and the patient's care was started at 11:05 AM.   Patient ID: [name], female    DOB: May 07, 1956, 57 y.o.   MRN: [id number]  Cough Associated symptoms include a fever. Pertinent negatives include no chest pain, ear pain, postnasal drip, rash, shortness of breath or wheezing.  Fever  Associated symptoms include coughing. Pertinent negatives include no chest pain, ear pain, rash or wheezing.   HPI Comments: [name] is a 57 y.o. female who presents to the [facility] complaining of constant sore throat and dry cough that started four days ago.  The patient states that her fever started abruptly this morning.  She lists nasal congestion, myalgias, and wheezing as associated symptoms.  She lists headache as an associated symptom.  She states that she has been unable to breathe from her nose.  She states that she has been taking OTC cough medication with some relief to her symptoms.  She denies any sick contacts.  She states that she had a flu shot this year.     Past Medical History  Diagnosis Date  . Hypertension   . Ulcerative colitis   . Osteopenia   . GERD (gastroesophageal reflux disease)   . Blood in stool   . Hemorrhoids   . Turner's syndrome     was on Provera and Premarin and d/c this  at age 68yr  . Fracture, intertrochanteric, left femur 05/02/2012  . DIVERTICULOSIS, COLON 02/03/2009    Qualifier: Diagnosis of  By: Elpidio Eric RN, Lupita Leash    . HEMORRHOIDS 02/03/2009    Qualifier: Diagnosis of  By: Elpidio Eric RN, Lupita Leash    . INFLAMMATORY BOWEL DISEASE - Followed by Dr. Jarold Motto in GI 02/03/2009    Qualifier: Diagnosis of  By: Elpidio Eric RN, Lupita Leash    . COLONIC POLYPS, HYPERPLASTIC, HX OF 02/03/2009    Qualifier: Diagnosis of  By: Surface RN, Lupita Leash    . B12 DEFICIENCY 02/09/2009    Qualifier: Diagnosis of  By: Yetta Barre RN, CGRN, Sheri    .  Dyslipidemia 06/20/2012  . Hearing loss     Bil/has hearing aids   Past Surgical History  Procedure Laterality Date  . Hernia repair      lt ing   . Tonsillectomy    . Fracture surgery  2004    fx lt thumb  . Upper gastrointestinal endoscopy    . Colonoscopy    . Orif finger fracture  06/14/2011    Procedure: OPEN REDUCTION INTERNAL FIXATION (ORIF) METACARPAL (FINGER) FRACTURE;  Surgeon: Tami Ribas, MD;  Location: [facility];  Service: Orthopedics;  Laterality: Right;  right ring  . Intramedullary (im) nail intertrochanteric  05/02/2012    Procedure: INTRAMEDULLARY (IM) NAIL INTERTROCHANTRIC;  Surgeon: Eulas Post, MD;  Location: [facility];  Service: Orthopedics;  Laterality: Left;   Family History  Problem Relation Age of Onset  . Lymphoma Mother     nhl-mantle cell  . Lymphoma Father     nhl  . Colon cancer Neg Hx   . Esophageal cancer Neg Hx   . Stomach cancer Neg Hx   . Rectal cancer Neg Hx    History   Social History  . Marital Status: Married    Spouse Name: N/A    Number of Children: N/A  . Years of Education: N/A   Occupational History  .  Not on file.   Social History Main Topics  . Smoking status: Never Smoker   . Smokeless tobacco: Never Used  . Alcohol Use: 0.6 oz/week    1 Glasses of wine per week     Comment: rare  . Drug Use: No  . Sexual Activity: Not on file   Other Topics Concern  . 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  HENT: Negative for ear pain and postnasal drip.   Eyes: Negative for visual disturbance.  Respiratory: Positive for cough. Negative for shortness of breath and wheezing.        Her daughter heard wheezing/no prior asthma    Cardiovascular: Negative for chest pain, palpitations and leg swelling.  Genitourinary: Negative for difficulty urinating.  Skin: Negative for rash.   A complete 10 system review of systems was obtained and all systems are negative except as noted in the HPI and PMH.    Objective:  Physical Exam  Nursing note and vitals reviewed. Constitutional: She is oriented to person, place, and time. She appears well-developed and well-nourished. No distress.  HENT:  Head: Normocephalic and atraumatic.  Right Ear: External ear normal.  Left Ear: External ear normal.  Nose: Nose normal.  Mouth/Throat: Oropharynx is clear and moist. No oropharyngeal exudate.  Eyes: Conjunctivae and EOM are normal. Pupils are equal, round, and reactive to light.  Neck: Normal range of motion and phonation normal. Neck supple. No thyromegaly present.  Cardiovascular: Normal rate, regular rhythm and normal heart sounds.   No murmur heard. Pulmonary/Chest: Effort normal.  There are fine rales at the right lower lung field posteriorly. No wheezing with forced 6 duration  Musculoskeletal: Normal range of motion. She exhibits no edema.  Lymphadenopathy:    She has no cervical adenopathy.  Neurological: She is alert and oriented to person, place, and time. No cranial nerve deficit.  Skin: Skin is warm and dry. No rash noted.  Psychiatric: She has a normal mood and affect. Her behavior is normal. Judgment and thought content normal.   BP 122/72  Pulse 103  Temp(Src) 102.7 F (39.3 C) (Oral)  Resp 16  Ht 4' 8.75" (1.441 m)  Wt 121 lb 12.8 oz (55.248 kg)  BMI 26.61 kg/m2  SpO2 97% Results for orders placed in visit on 05/16/13  POCT INFLUENZA A/B      Result Value Range   Influenza A, POC Negative     Influenza B, POC Negative    POCT CBC      Result Value Range   WBC 6.2  4.6 - 10.2 K/uL   Lymph, poc 0.8  0.6 - 3.4   POC LYMPH PERCENT 13.4  10 - 50 %L   MID (cbc) 0.4  0 - 0.9   POC MID % 5.9  0 - 12 %M    POC Granulocyte 5.0  2 - 6.9   Granulocyte percent 80.7 (*) 37 - 80 %G   RBC 4.38  4.04 - 5.48 M/uL   Hemoglobin 12.9  12.2 - 16.2 g/dL   HCT, POC 40.9  81.1 - 47.9 %   MCV 96.0  80 - 97 fL  MCH, POC 29.5  27 - 31.2 pg   MCHC 30.7 (*) 31.8 - 35.4 g/dL   RDW, POC 14.7     Platelet Count, POC 112 (*) 142 - 424 K/uL   MPV 10.3  0 - 99.8 fL   [facility] reading (PRIMARY) by  Dr.Maquita Sandoval= infiltrate right lower lobe    Assessment & Plan:  Fever, unspecified - Plan: POCT Influenza A/B, POCT CBC, DG Chest 2 View, cefTRIAXone (ROCEPHIN) injection 1 g  Cough - Plan: DG Chest 2 View, cefTRIAXone (ROCEPHIN) injection 1 g  CAP (community acquired pneumonia) - Plan: levofloxacin (LEVAQUIN) 500 MG tablet, HYDROcodone-homatropine (HYCODAN) 5-1.5 MG/5ML syrup  Meds ordered this encounter  Medications  . cefTRIAXone (ROCEPHIN) injection 1 g    Sig:   . levofloxacin (LEVAQUIN) 500 MG tablet    Sig: Take 1 tablet (500 mg total) by mouth daily.    Dispense:  7 tablet    Refill:  0  . HYDROcodone-homatropine (HYCODAN) 5-1.5 MG/5ML syrup    Sig: Take 5 mLs by mouth every 6 (six) hours as needed for cough.    Dispense:  120 mL    Refill:  0     I have completed the patient encounter in its entirety as documented by the scribe, with editing by me where necessary. Cayle Thunder P. Merla Riches, M.D.

## 2013-05-25 ENCOUNTER — Ambulatory Visit (INDEPENDENT_AMBULATORY_CARE_PROVIDER_SITE_OTHER): Payer: 59 | Admitting: Family Medicine

## 2013-05-25 VITALS — BP 138/80 | HR 87 | Temp 97.9°F | Wt 126.0 lb

## 2013-05-25 DIAGNOSIS — J069 Acute upper respiratory infection, unspecified: Secondary | ICD-10-CM

## 2013-05-25 DIAGNOSIS — D696 Thrombocytopenia, unspecified: Secondary | ICD-10-CM

## 2013-05-25 NOTE — Patient Instructions (Signed)
-  follow up in 1 month to check platelets and cholesterol and for physical  -schedule physical in 1 month

## 2013-05-25 NOTE — Progress Notes (Signed)
No chief complaint on file.   HPI:  ? CAP: -seen 12/27 by Dr. Laney Pastor and tx for CAP with levaquin and rocephin, though CXR negative and cbc without leukocytosis -had fever, congestion, cough, drainage, body aches -feeling much better, but still tired and and having drainge  Thrombocytopenia: -on cbc when sick -likely flu/viral  HTN: -stable  HLD:             -stable will recheck next time ROS: See pertinent positives and negatives per HPI.  Past Medical History  Diagnosis Date  . Hypertension   . Ulcerative colitis   . Osteopenia   . GERD (gastroesophageal reflux disease)   . Blood in stool   . Hemorrhoids   . Turner's syndrome     was on Provera and Premarin and d/c this  at age 76yr  . Fracture, intertrochanteric, left femur 05/02/2012  . DIVERTICULOSIS, COLON 02/03/2009    Qualifier: Diagnosis of  By: Varney Daily RN, Butch Penny    . HEMORRHOIDS 02/03/2009    Qualifier: Diagnosis of  By: Varney Daily RN, Butch Penny    . INFLAMMATORY BOWEL DISEASE - Followed by Dr. Sharlett Iles in GI 02/03/2009    Qualifier: Diagnosis of  By: Varney Daily RN, Butch Penny    . COLONIC POLYPS, HYPERPLASTIC, HX OF 02/03/2009    Qualifier: Diagnosis of  By: Surface RN, Butch Penny    . B12 DEFICIENCY 02/09/2009    Qualifier: Diagnosis of  By: Ronnald Ramp RN, CGRN, Sheri    . Dyslipidemia 06/20/2012  . Hearing loss     Bil/has hearing aids    Past Surgical History  Procedure Laterality Date  . Hernia repair      lt ing   . Tonsillectomy    . Fracture surgery  2004    fx lt thumb  . Upper gastrointestinal endoscopy    . Colonoscopy    . Orif finger fracture  06/14/2011    Procedure: OPEN REDUCTION INTERNAL FIXATION (ORIF) METACARPAL (FINGER) FRACTURE;  Surgeon: Tennis Must, MD;  Location: [place];  Service: Orthopedics;  Laterality: Right;  right ring  . Intramedullary (im) nail intertrochanteric  05/02/2012    Procedure: INTRAMEDULLARY (IM) NAIL INTERTROCHANTRIC;  Surgeon: Johnny Bridge, MD;  Location: [place];  Service: Orthopedics;  Laterality: Left;    Family History  Problem Relation Age of Onset  . Lymphoma Mother     nhl-mantle cell  . Lymphoma Father     nhl  . Colon cancer Neg Hx   . Esophageal cancer Neg Hx   . Stomach cancer Neg Hx   . Rectal cancer Neg Hx     History   Social History  . Marital Status: Married    Spouse Name: N/A    Number of Children: N/A  . Years of Education: N/A   Social History Main Topics  . Smoking status: Never Smoker   . Smokeless tobacco: Never Used  . Alcohol Use: 0.6 oz/week    1 Glasses of wine per week     Comment: rare  . Drug Use: No  . Sexual Activity: Not on file   Other Topics Concern  . Not on file   Social History Narrative   Works at 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 Administer in upper arm, thigh, or abdomen, Disp: , Rfl: ;  fish oil-omega-3 fatty acids 1000 MG capsule, Take 1 g by mouth daily., Disp: , Rfl: ;  HYDROcodone-homatropine (HYCODAN) 5-1.5 MG/5ML syrup, Take 5 mLs by mouth every 6 (six) hours as needed for cough., Disp: 120 mL, Rfl: 0 lisinopril (PRINIVIL,ZESTRIL) 5 MG tablet, Take 1 tablet (5 mg total) by mouth daily., Disp: 90 tablet, Rfl: 3;  Multiple Vitamin (MULTIVITAMIN) tablet, Take 1 tablet by mouth daily., Disp: , Rfl: ;  Vitamin D, Ergocalciferol, (DRISDOL) 50000 UNITS CAPS, Take 1 capsule (50,000 Units total) by mouth every 7 (seven) days., Disp: 30 capsule, Rfl: 1;  vitamin E 400 UNIT capsule, Take 400 Units by mouth daily., Disp: , Rfl:   EXAM:  Filed Vitals:   05/25/13 1046  BP: 138/80  Pulse: 87  Temp: 97.9 F (36.6 C)   Body mass index is 27.52 kg/(m^2).  GENERAL: vitals reviewed and listed above, alert, oriented, appears well hydrated and in no acute  distress  HEENT: atraumatic, conjunttiva clear, no obvious abnormalities on inspection of external nose and ears  NECK: no obvious masses on inspection  LUNGS: clear to auscultation bilaterally, no wheezes, rales or rhonchi, good air movement  CV: HRRR, no peripheral edema  MS: moves all extremities without noticeable abnormality  PSYCH: pleasant and cooperative, no obvious depression or anxiety  ASSESSMENT AND PLAN:  Discussed the following assessment and plan:  Acute upper respiratory infections of unspecified site  Thrombocytopenia, unspecified  -reviewed ucc notes, did not have pna on xray, symptoms resolved - was likely flu or flu like illness and thrombocytopenia likely viral - discussed other etiologies and advised rechecking platelets - she will wait until physical in 3-4 weeks -will check other labs then as well -Patient advised to return or notify a doctor immediately if symptoms worsen or persist or new concerns arise.  Patient Instructions  -follow up in 1 month to check platelets and cholesterol and for physical  -schedule physical in 1 month     KIM, HANNAH R.

## 2013-05-25 NOTE — Progress Notes (Signed)
Pre visit review using our clinic review tool, if applicable. No additional management support is needed unless otherwise documented below in the visit note. 

## 2013-06-29 ENCOUNTER — Encounter: Payer: Self-pay | Admitting: Family Medicine

## 2013-07-15 ENCOUNTER — Encounter: Payer: 59 | Admitting: Family Medicine

## 2013-08-17 ENCOUNTER — Encounter: Payer: Self-pay | Admitting: Family Medicine

## 2013-08-17 ENCOUNTER — Ambulatory Visit (INDEPENDENT_AMBULATORY_CARE_PROVIDER_SITE_OTHER): Payer: 59 | Admitting: Family Medicine

## 2013-08-17 VITALS — BP 140/80 | Temp 97.8°F | Ht <= 58 in | Wt 125.0 lb

## 2013-08-17 DIAGNOSIS — Z0000 Encounter for general adult medical examination without abnormal findings: Secondary | ICD-10-CM

## 2013-08-17 DIAGNOSIS — Z23 Encounter for immunization: Secondary | ICD-10-CM

## 2013-08-17 DIAGNOSIS — K219 Gastro-esophageal reflux disease without esophagitis: Secondary | ICD-10-CM

## 2013-08-17 DIAGNOSIS — F329 Major depressive disorder, single episode, unspecified: Secondary | ICD-10-CM

## 2013-08-17 DIAGNOSIS — I10 Essential (primary) hypertension: Secondary | ICD-10-CM

## 2013-08-17 DIAGNOSIS — F3289 Other specified depressive episodes: Secondary | ICD-10-CM

## 2013-08-17 DIAGNOSIS — F32A Depression, unspecified: Secondary | ICD-10-CM

## 2013-08-17 DIAGNOSIS — K5289 Other specified noninfective gastroenteritis and colitis: Secondary | ICD-10-CM

## 2013-08-17 LAB — BASIC METABOLIC PANEL
BUN: 15 mg/dL (ref 6–23)
CO2: 29 meq/L (ref 19–32)
CREATININE: 0.6 mg/dL (ref 0.4–1.2)
Calcium: 9 mg/dL (ref 8.4–10.5)
Chloride: 104 mEq/L (ref 96–112)
GFR: 105.41 mL/min (ref >60.00)
GLUCOSE: 75 mg/dL (ref 70–99)
Potassium: 3.5 mEq/L (ref 3.5–5.1)
Sodium: 139 mEq/L (ref 135–145)

## 2013-08-17 LAB — LIPID PANEL
Cholesterol: 215 mg/dL — ABNORMAL HIGH (ref 0–200)
HDL: 67.1 mg/dL (ref >39.00)
LDL Cholesterol: 127 mg/dL — ABNORMAL HIGH (ref 0–99)
TRIGLYCERIDES: 103 mg/dL (ref 0.0–149.0)
Total CHOL/HDL Ratio: 3
VLDL: 20.6 mg/dL (ref 0.0–40.0)

## 2013-08-17 LAB — HEMOGLOBIN A1C: Hgb A1c MFr Bld: 5.4 % (ref 4.6–6.5)

## 2013-08-17 MED ORDER — ESCITALOPRAM OXALATE 10 MG PO TABS: 10.0000 mg | ORAL_TABLET | Freq: Every day | ORAL | Status: DC

## 2013-08-17 MED ORDER — LISINOPRIL 5 MG PO TABS: 5.0000 mg | ORAL_TABLET | Freq: Every day | ORAL | Status: DC

## 2013-08-17 NOTE — Progress Notes (Signed)
Pre visit review using our clinic review tool, if applicable. No additional management support is needed unless otherwise documented below in the visit note. 

## 2013-08-17 NOTE — Addendum Note (Signed)
Addended by: Westley Hummer B on: 08/17/2013 10:18 AM   Modules accepted: Orders

## 2013-08-17 NOTE — Progress Notes (Signed)
Chief Complaint  Patient presents with  . Annual Exam    HPI:  Here for CPE:  -Follow up/Concerns today:   HTN/Dyslipidemia: -meds: fish oil, lisinopril -denies: needs refill on lisinopril  Osteopenia: -followed by her gynecologist, Dr. Erroll Luna, started prolia last June, will see her for urinary issues also  GERD: -controlled with lifestyle  IBD -followed by GI, Dr. Sharlett Iles retired  Thrombocytopenia: -incidental on labs when sick in December - advised 1 month recheck  Depressed/stressed: -daughter is alcoholic -feels overwhelmed at times and cries -depressed mood -seeing therapist -no thoughts of self harm   -Diet: variety of foods, balance and well rounded - doing well with weight watchers  -Exercise: no regular exercise - is active at work but no regular CV exercise  -Diabetes and Dyslipidemia Screening: time to recheck  -Hx of HTN: no  -Vaccines: needs tdap  -pap history: seees gyn  -wants STI testing: no  -FH breast, colon or ovarian ca: see FH -has mammogram scheduled  -Alcohol, Tobacco, drug use: see social history  Review of Systems - Review of Systems  Constitutional: Negative for fever, weight loss and malaise/fatigue.  HENT: Negative for ear pain.   Eyes: Negative for blurred vision and double vision.  Respiratory: Negative for cough and shortness of breath.   Cardiovascular: Negative for chest pain and palpitations.  Gastrointestinal: Negative for heartburn, diarrhea, constipation and blood in stool.  Genitourinary: Positive for urgency. Negative for hematuria.  Musculoskeletal: Negative for falls and myalgias.  Skin: Negative for rash.  Neurological: Negative for dizziness.  Endo/Heme/Allergies: Does not bruise/bleed easily.  Psychiatric/Behavioral: Positive for depression. Negative for suicidal ideas, hallucinations and memory loss. The patient is not nervous/anxious.      Past Medical History  Diagnosis Date  . Hypertension   .  Ulcerative colitis   . Osteopenia   . GERD (gastroesophageal reflux disease)   . Blood in stool   . Hemorrhoids   . Turner's syndrome     was on Provera and Premarin and d/c this  at age 47yr  . Fracture, intertrochanteric, left femur 05/02/2012  . DIVERTICULOSIS, COLON 02/03/2009    Qualifier: Diagnosis of  By: Varney Daily RN, Butch Penny    . HEMORRHOIDS 02/03/2009    Qualifier: Diagnosis of  By: Varney Daily RN, Butch Penny    . INFLAMMATORY BOWEL DISEASE - Followed by Dr. Sharlett Iles in GI 02/03/2009    Qualifier: Diagnosis of  By: Varney Daily RN, Butch Penny    . COLONIC POLYPS, HYPERPLASTIC, HX OF 02/03/2009    Qualifier: Diagnosis of  By: Surface RN, Butch Penny    . B12 DEFICIENCY 02/09/2009    Qualifier: Diagnosis of  By: Ronnald Ramp RN, CGRN, Sheri    . Dyslipidemia 06/20/2012  . Hearing loss     Bil/has hearing aids    Past Surgical History  Procedure Laterality Date  . Hernia repair      lt ing   . Tonsillectomy    . Fracture surgery  2004    fx lt thumb  . Upper gastrointestinal endoscopy    . Colonoscopy    . Orif finger fracture  06/14/2011    Procedure: OPEN REDUCTION INTERNAL FIXATION (ORIF) METACARPAL (FINGER) FRACTURE;  Surgeon: Tennis Must, MD;  Location: [place];  Service: Orthopedics;  Laterality: Right;  right ring  . Intramedullary (im) nail intertrochanteric  05/02/2012    Procedure: INTRAMEDULLARY (IM) NAIL INTERTROCHANTRIC;  Surgeon: Johnny Bridge, MD;  Location: [place];  Service: Orthopedics;  Laterality: Left;  Family History  Problem Relation Age of Onset  . Lymphoma Mother     nhl-mantle cell  . Lymphoma Father     nhl  . Colon cancer Neg Hx   . Esophageal cancer Neg Hx   . Stomach cancer Neg Hx   . Rectal cancer Neg Hx     History   Social History  . Marital Status: Married    Spouse Name: N/A    Number of Children: N/A  . Years of Education: N/A   Social History Main Topics  . Smoking status: Never Smoker   . Smokeless tobacco: Never Used  . Alcohol  Use: 0.6 oz/week    1 Glasses of wine per week     Comment: rare  . Drug Use: No  . Sexual Activity: None   Other Topics Concern  . None   Social History Narrative   Works at 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 Administer in upper arm, thigh, or abdomen, Disp: , Rfl: ;  fish oil-omega-3 fatty acids 1000 MG capsule, Take 1 g by mouth daily., Disp: , Rfl: ;  lisinopril (PRINIVIL,ZESTRIL) 5 MG tablet, Take 1 tablet (5 mg total) by mouth daily., Disp: 90 tablet, Rfl: 3 Multiple Vitamin (MULTIVITAMIN) tablet, Take 1 tablet by mouth daily., Disp: , Rfl: ;  Vitamin D, Ergocalciferol, (DRISDOL) 50000 UNITS CAPS, Take 1 capsule (50,000 Units total) by mouth every 7 (seven) days., Disp: 30 capsule, Rfl: 1;  vitamin E 400 UNIT capsule, Take 400 Units by mouth daily., Disp: , Rfl: ;  escitalopram (LEXAPRO) 10 MG tablet, Take 1 tablet (10 mg total) by mouth daily., Disp: 30 tablet, Rfl: 3  EXAM:  Filed Vitals:   08/17/13 0936  BP: 140/80  Temp: 97.8 F (36.6 C)    GENERAL: vitals reviewed and listed below, alert, oriented, appears well hydrated and in no acute distress  HEENT: head atraumatic, PERRLA, normal appearance of eyes, ears, nose and mouth. moist mucus membranes.  NECK: supple, no masses or lymphadenopathy  LUNGS: clear to auscultation bilaterally, no rales, rhonchi or wheeze  CV: HRRR, no peripheral edema or cyanosis, normal pedal pulses  BREAST: normal appearance - no lesions or discharge, on palpation normal breast tissue without any suspicious masses  ABDOMEN: bowel sounds normal, soft, non tender to palpation, no masses, no rebound or guarding  GU: normal appearance of external genitalia - no lesions or masses, normal vaginal mucosa - no abnormal  discharge, normal appearance of cervix - no lesions or abnormal discharge, no masses or tenderness on palpation of uterus and ovaries.  RECTAL: refused  SKIN: no rash or abnormal lesions  MS: normal gait, moves all extremities normally  NEURO: CN II-XII grossly intact, normal muscle strength and sensation to light touch on extremities  PSYCH: normal affect, pleasant and cooperative  ASSESSMENT AND PLAN:  Discussed the following assessment and plan:  HYPERTENSION - Plan: Basic metabolic panel, lisinopril (PRINIVIL,ZESTRIL) 5 MG tablet  GERD  INFLAMMATORY BOWEL DISEASE - Followed by GI  Depression - Plan: escitalopram (LEXAPRO) 10 MG tablet  Visit for preventive health examination - Plan: CBC with Differential, Lipid Panel, Hemoglobin A1c  -Discussed and advised all [place] preventive services health task force level  A and B recommendations for age, sex and risks.  -Advised at least 150 minutes of exercise per week and a healthy diet low in saturated fats and sweets and consisting of fresh fruits and vegetables, lean meats such as fish and white chicken and whole grains.  -discussed options for depression and decided to start lexapro after discussion, follow up 1 month  -BP medication refilled  -FASTING labs, studies and vaccines per orders this encounter  Orders Placed This Encounter  Procedures  . CBC with Differential  . Lipid Panel  . Hemoglobin A1c  . Basic metabolic panel    Patient Instructions  -start the new medication for depression  -We have ordered labs or studies at this visit. It can take up to 1-2 weeks for results and processing. We will contact you with instructions IF your results are abnormal. Normal results will be released to your [facility]. If you have not heard from [place] or can not find your results in [facility] in 2 weeks please contact our office.   We recommend the following healthy lifestyle measures: - eat a healthy diet consisting of lots of  vegetables, fruits, beans, nuts, seeds, healthy meats such as white chicken and fish and whole grains.  - avoid fried foods, fast food, processed foods, sodas, red meet and other fattening foods.  - get a least 150 minutes of aerobic exercise per week.     -follow up in 1 month    Patient advised to return to clinic immediately if symptoms worsen or persist or new concerns.  @LIFEPLAN @  Return in about 1 month (around 09/17/2013) for for depression.  Colin Benton R.

## 2013-08-17 NOTE — Patient Instructions (Addendum)
-  start the new medication for depression  -We have ordered labs or studies at this visit. It can take up to 1-2 weeks for results and processing. We will contact you with instructions IF your results are abnormal. Normal results will be released to your [facility]. If you have not heard from [place] or can not find your results in [facility] in 2 weeks please contact our office.   We recommend the following healthy lifestyle measures: - eat a healthy diet consisting of lots of vegetables, fruits, beans, nuts, seeds, healthy meats such as white chicken and fish and whole grains.  - avoid fried foods, fast food, processed foods, sodas, red meet and other fattening foods.  - get a least 150 minutes of aerobic exercise per week.   -get mammogram  -follow up in 1 month

## 2013-08-18 ENCOUNTER — Telehealth: Payer: Self-pay | Admitting: Family Medicine

## 2013-08-18 ENCOUNTER — Other Ambulatory Visit: Payer: Self-pay

## 2013-08-18 DIAGNOSIS — Z1231 Encounter for screening mammogram for malignant neoplasm of breast: Secondary | ICD-10-CM

## 2013-08-18 LAB — CBC WITH DIFFERENTIAL/PLATELET
BASOS ABS: 0 10*3/uL (ref 0.0–0.1)
Basophils Relative: 0.2 % (ref 0.0–3.0)
EOS ABS: 0.1 10*3/uL (ref 0.0–0.7)
Eosinophils Relative: 2.1 % (ref 0.0–5.0)
HCT: 43.4 % (ref 36.0–46.0)
Hemoglobin: 14.5 g/dL (ref 12.0–15.0)
Lymphocytes Relative: 31.6 % (ref 12.0–46.0)
Lymphs Abs: 1.9 10*3/uL (ref 0.7–4.0)
MCHC: 33.3 g/dL (ref 30.0–36.0)
MCV: 92.2 fl (ref 78.0–100.0)
MONOS PCT: 5.7 % (ref 3.0–12.0)
Monocytes Absolute: 0.3 10*3/uL (ref 0.1–1.0)
NEUTROS PCT: 60.4 % (ref 43.0–77.0)
Neutro Abs: 3.6 10*3/uL (ref 1.4–7.7)
Platelets: 185 10*3/uL (ref 150.0–400.0)
RBC: 4.71 Mil/uL (ref 3.87–5.11)
RDW: 13.4 % (ref 11.5–14.6)
WBC: 6 10*3/uL (ref 4.5–10.5)

## 2013-08-18 NOTE — Telephone Encounter (Signed)
Relevant patient education assigned to patient using Emmi. ° °

## 2013-08-27 ENCOUNTER — Ambulatory Visit
Admission: RE | Admit: 2013-08-27 | Discharge: 2013-08-27 | Disposition: A | Discharge status: Home / Self Care | Payer: 59 | Source: Ambulatory Visit

## 2013-08-27 DIAGNOSIS — Z1231 Encounter for screening mammogram for malignant neoplasm of breast: Secondary | ICD-10-CM

## 2014-01-16 ENCOUNTER — Encounter: Payer: Self-pay | Admitting: Gastroenterology

## 2014-06-18 ENCOUNTER — Other Ambulatory Visit: Payer: Self-pay | Admitting: Obstetrics

## 2014-06-18 DIAGNOSIS — E559 Vitamin D deficiency, unspecified: Secondary | ICD-10-CM

## 2014-06-18 DIAGNOSIS — M810 Age-related osteoporosis without current pathological fracture: Secondary | ICD-10-CM

## 2014-08-18 ENCOUNTER — Other Ambulatory Visit: Payer: Self-pay

## 2014-08-18 ENCOUNTER — Ambulatory Visit
Admission: RE | Admit: 2014-08-18 | Discharge: 2014-08-18 | Disposition: A | Discharge status: Home / Self Care | Payer: 59 | Source: Ambulatory Visit | Attending: Obstetrics | Admitting: Obstetrics

## 2014-08-18 DIAGNOSIS — M810 Age-related osteoporosis without current pathological fracture: Secondary | ICD-10-CM

## 2014-08-18 DIAGNOSIS — Z1231 Encounter for screening mammogram for malignant neoplasm of breast: Secondary | ICD-10-CM

## 2014-08-18 DIAGNOSIS — E559 Vitamin D deficiency, unspecified: Secondary | ICD-10-CM

## 2014-09-01 ENCOUNTER — Ambulatory Visit
Admission: RE | Admit: 2014-09-01 | Discharge: 2014-09-01 | Disposition: A | Discharge status: Home / Self Care | Payer: 59 | Source: Ambulatory Visit

## 2014-09-01 DIAGNOSIS — Z1231 Encounter for screening mammogram for malignant neoplasm of breast: Secondary | ICD-10-CM

## 2015-05-31 DIAGNOSIS — M810 Age-related osteoporosis without current pathological fracture: Secondary | ICD-10-CM | POA: Diagnosis not present

## 2015-06-21 DIAGNOSIS — H2512 Age-related nuclear cataract, left eye: Secondary | ICD-10-CM | POA: Diagnosis not present

## 2015-06-21 DIAGNOSIS — H53413 Scotoma involving central area, bilateral: Secondary | ICD-10-CM | POA: Diagnosis not present

## 2015-06-21 DIAGNOSIS — H0234 Blepharochalasis left upper eyelid: Secondary | ICD-10-CM | POA: Diagnosis not present

## 2015-06-21 DIAGNOSIS — H0231 Blepharochalasis right upper eyelid: Secondary | ICD-10-CM | POA: Diagnosis not present

## 2015-09-02 ENCOUNTER — Telehealth: Payer: Self-pay | Admitting: Family Medicine

## 2015-09-05 NOTE — Telephone Encounter (Signed)
Left message on machine for patient that she had a tdap 2015.

## 2015-09-05 NOTE — Telephone Encounter (Signed)
[facility] Night - Client [facility] Patient Name: [name] Gender: Female DOB: 09-11-1955 Age: 60 Y 1 M 23 D Return Phone Number: [phone] (Primary) Address: City/State/Zip: [place] [address] Client [facility] Night - Client Client Site [facility] - Night Physician Colin Benton Contact Type Call Who Is Calling Patient / Member / Family / Caregiver Call Type Triage / Clinical Relationship To Patient Self Return Phone Number [phone] (Primary) Chief Complaint Medication Question (non symptomatic) Reason for Call Symptomatic / Request for [place] states that she had metal in her finger and it was taken out, but questions if she should go and have a tetanus shot? PreDisposition InappropriateToAsk Translation No Nurse Assessment Nurse: Manson Allan, RN, Melissa Date/Time (Eastern Time): 09/02/2015 4:44:15 PM Confirm and document reason for call. If symptomatic, describe symptoms. You must click the next button to save text entered. ---Caller states that she got a metal hook in her finger yesterday and is curious if she needs a tetanus shot. Has the patient traveled out of the country within the last 30 days? ---No Does the patient have any new or worsening symptoms? ---Yes Will a triage be completed? ---Yes Related visit to physician within the last 2 weeks? ---No Does the PT have any chronic conditions? (i.e. diabetes, asthma, etc.) ---No Is this a behavioral health or substance abuse call? ---No Guidelines Guideline Title Affirmed Question Affirmed Notes Nurse Date/Time (Eastern Time) Skin Injury [1] Last tetanus shot > 10 years ago AND [2] CLEAN cut or scrape Manson Allan, RN, Melissa 09/02/2015 4:45:30 PM Disp. Time Eilene Ghazi Time) Disposition Final User 09/02/2015 4:48:27 PM See PCP When Office is Open (within 3 days) Yes Moon,  RN, Melissa PLEASE NOTE: All timestamps contained within this report are represented as [place] Standard Time. CONFIDENTIALTY NOTICE: This fax transmission is intended only for the addressee. It contains information that is legally privileged, confidential or otherwise protected from use or disclosure. If you are not the intended recipient, you are strictly prohibited from reviewing, disclosing, copying using or disseminating any of this information or taking any action in reliance on or regarding this information. If you have received this fax in error, please notify us immediately by telephone so that we can arrange for its return to [place]. Phone: [phone], Toll-Free: [phone], Fax: [phone] Page: 2 of 2 Call Id: [id number] [place] Understands: Yes Disagree/Comply: Comply Care Advice Given Per Guideline SEE PCP WITHIN 3 DAYS: * You need to be seen within 2 or 3 days. Call your doctor during regular office hours and make an appointment. An urgent care center is often the best source of care if your doctor's office is closed or you can't get an appointment. NOTE: If office will be open tomorrow, tell caller to call then, not in 3 days. TETANUS SHOT: * You will probably need a tetanus booster shot. * Try to get it within 3 days. CALL BACK IF: * Looks infected (pus, redness, increasing tenderness) * Doesn't heal within 14 days * Bleeding does not stop after using direct pressure * You become worse. CARE ADVICE given per Skin Injury (Adult) guideline.

## 2015-09-26 ENCOUNTER — Other Ambulatory Visit: Payer: Self-pay

## 2015-09-26 DIAGNOSIS — Z1231 Encounter for screening mammogram for malignant neoplasm of breast: Secondary | ICD-10-CM

## 2015-10-06 ENCOUNTER — Ambulatory Visit: Payer: Self-pay

## 2015-10-20 ENCOUNTER — Ambulatory Visit
Admission: RE | Admit: 2015-10-20 | Discharge: 2015-10-20 | Disposition: A | Discharge status: Home / Self Care | Payer: 59 | Source: Ambulatory Visit

## 2015-10-20 DIAGNOSIS — Z1231 Encounter for screening mammogram for malignant neoplasm of breast: Secondary | ICD-10-CM | POA: Diagnosis not present

## 2015-10-26 MED FILL — PROLIA 60 MG/ML SOLN: 60 | 90 days supply | Qty: 1 | Fill #1

## 2015-11-02 DIAGNOSIS — F3173 Bipolar disorder, in partial remission, most recent episode manic: Secondary | ICD-10-CM | POA: Diagnosis not present

## 2015-11-18 DIAGNOSIS — M810 Age-related osteoporosis without current pathological fracture: Secondary | ICD-10-CM | POA: Diagnosis not present

## 2015-11-30 DIAGNOSIS — N63 Unspecified lump in breast: Secondary | ICD-10-CM | POA: Diagnosis not present

## 2015-11-30 DIAGNOSIS — M810 Age-related osteoporosis without current pathological fracture: Secondary | ICD-10-CM | POA: Diagnosis not present

## 2015-11-30 DIAGNOSIS — Z01419 Encounter for gynecological examination (general) (routine) without abnormal findings: Secondary | ICD-10-CM | POA: Diagnosis not present

## 2015-11-30 DIAGNOSIS — Z6829 Body mass index (BMI) 29.0-29.9, adult: Secondary | ICD-10-CM | POA: Diagnosis not present

## 2015-11-30 DIAGNOSIS — N3941 Urge incontinence: Secondary | ICD-10-CM | POA: Diagnosis not present

## 2015-12-03 ENCOUNTER — Other Ambulatory Visit: Payer: Self-pay | Admitting: Obstetrics

## 2015-12-03 DIAGNOSIS — N63 Unspecified lump in unspecified breast: Secondary | ICD-10-CM

## 2015-12-12 ENCOUNTER — Other Ambulatory Visit: Payer: Self-pay

## 2015-12-14 DIAGNOSIS — Z13220 Encounter for screening for lipoid disorders: Secondary | ICD-10-CM | POA: Diagnosis not present

## 2015-12-14 DIAGNOSIS — Z0000 Encounter for general adult medical examination without abnormal findings: Secondary | ICD-10-CM | POA: Diagnosis not present

## 2015-12-14 DIAGNOSIS — Z130 Encounter for screening for diseases of the blood and blood-forming organs and certain disorders involving the immune mechanism: Secondary | ICD-10-CM | POA: Diagnosis not present

## 2015-12-14 DIAGNOSIS — F3173 Bipolar disorder, in partial remission, most recent episode manic: Secondary | ICD-10-CM | POA: Diagnosis not present

## 2015-12-14 DIAGNOSIS — Z1329 Encounter for screening for other suspected endocrine disorder: Secondary | ICD-10-CM | POA: Diagnosis not present

## 2015-12-14 DIAGNOSIS — Z131 Encounter for screening for diabetes mellitus: Secondary | ICD-10-CM | POA: Diagnosis not present

## 2015-12-16 ENCOUNTER — Other Ambulatory Visit: Payer: Self-pay | Admitting: Obstetrics

## 2015-12-16 ENCOUNTER — Ambulatory Visit
Admission: RE | Admit: 2015-12-16 | Discharge: 2015-12-16 | Disposition: A | Discharge status: Home / Self Care | Payer: 59 | Source: Ambulatory Visit | Attending: Obstetrics | Admitting: Obstetrics

## 2015-12-16 DIAGNOSIS — N63 Unspecified lump in unspecified breast: Secondary | ICD-10-CM

## 2015-12-16 MED FILL — VIT D2 1.25 MG (50,000 UNIT: 1.25 MG | 84 days supply | Qty: 12 | Fill #0

## 2015-12-29 ENCOUNTER — Encounter: Payer: Self-pay | Admitting: Family Medicine

## 2015-12-29 ENCOUNTER — Ambulatory Visit (INDEPENDENT_AMBULATORY_CARE_PROVIDER_SITE_OTHER): Payer: 59 | Admitting: Family Medicine

## 2015-12-29 VITALS — BP 142/78 | HR 85 | Temp 98.2°F | Ht <= 58 in | Wt 134.2 lb

## 2015-12-29 DIAGNOSIS — E785 Hyperlipidemia, unspecified: Secondary | ICD-10-CM

## 2015-12-29 DIAGNOSIS — E559 Vitamin D deficiency, unspecified: Secondary | ICD-10-CM

## 2015-12-29 DIAGNOSIS — M79645 Pain in left finger(s): Secondary | ICD-10-CM | POA: Diagnosis not present

## 2015-12-29 DIAGNOSIS — I10 Essential (primary) hypertension: Secondary | ICD-10-CM

## 2015-12-29 MED ORDER — PRAVASTATIN SODIUM 20 MG PO TABS: 20.0000 mg | ORAL_TABLET | Freq: Every day | ORAL | 3 refills | Status: DC

## 2015-12-29 MED ORDER — LISINOPRIL 5 MG PO TABS: 5.0000 mg | ORAL_TABLET | Freq: Every day | ORAL | 3 refills | Status: DC

## 2015-12-29 MED FILL — LISINOPRIL 5 MG TABLET: 5 | 90 days supply | Qty: 90 | Fill #0

## 2015-12-29 MED FILL — PRAVASTATIN SODIUM 20 MG TA: 20 | 90 days supply | Qty: 90 | Fill #0

## 2015-12-29 NOTE — Progress Notes (Signed)
Pre visit review using our clinic review tool, if applicable. No additional management support is needed unless otherwise documented below in the visit note. 

## 2015-12-29 NOTE — Progress Notes (Signed)
HPI:  Acute visit for:  Hyperlipidemia: -mildly elevated in 2015 and lifestyle recs advised; she has not been in since -report gyn told her needs to see [place] after recent check -tot chol269, trig 76, HDL 76, LDL 178 -reports on statin remotely and tolerated well -denies sig FH, CP, SOB, DOE -reports eats health and very active - does not feel lifestyle can improve  Finger pain: -L mid digit -moving daughter and dropped something heavy on finger 3 nights ago -pain, swelling and bruising -denies: fevers, malaise, increasing redness, inability to move finger, pain tol with OTC options  Vit D def: -gyn treating  Chronic issues:  HTN: -meds: lisinopril in the past -lost to f/u, did not return -reports tolerated lisinopril well, but just forgot to take -report BP up recently at home and gyn office    ROS: See pertinent positives and negatives per HPI.  Past Medical History:  Diagnosis Date  . B12 DEFICIENCY 02/09/2009   Qualifier: Diagnosis of  By: Ronnald Ramp RN, CGRN, Sheri    . Blood in stool   . COLONIC POLYPS, HYPERPLASTIC, HX OF 02/03/2009   Qualifier: Diagnosis of  By: Surface RN, Butch Penny    . DIVERTICULOSIS, COLON 02/03/2009   Qualifier: Diagnosis of  By: Varney Daily RN, Butch Penny    . Dyslipidemia 06/20/2012  . Fracture, intertrochanteric, left femur ([place]) 05/02/2012  . GERD (gastroesophageal reflux disease)   . Hearing loss    Bil/has hearing aids  . Hemorrhoids   . HEMORRHOIDS 02/03/2009   Qualifier: Diagnosis of  By: Varney Daily RN, Butch Penny    . Hypertension   . INFLAMMATORY BOWEL DISEASE - Followed by Dr. Sharlett Iles in GI 02/03/2009   Qualifier: Diagnosis of  By: Varney Daily RN, Butch Penny    . Osteopenia   . Turner's syndrome    was on Provera and Premarin and d/c this  at age 79yr  . Ulcerative colitis     Past Surgical History:  Procedure Laterality Date  . COLONOSCOPY    . FRACTURE SURGERY  2004   fx lt thumb  . HERNIA REPAIR     lt ing   . INTRAMEDULLARY (IM) NAIL  INTERTROCHANTERIC  05/02/2012   Procedure: INTRAMEDULLARY (IM) NAIL INTERTROCHANTRIC;  Surgeon: Johnny Bridge, MD;  Location: [facility];  Service: Orthopedics;  Laterality: Left;  . ORIF FINGER FRACTURE  06/14/2011   Procedure: OPEN REDUCTION INTERNAL FIXATION (ORIF) METACARPAL (FINGER) FRACTURE;  Surgeon: Tennis Must, MD;  Location: [facility];  Service: Orthopedics;  Laterality: Right;  right ring  . TONSILLECTOMY    . UPPER GASTROINTESTINAL ENDOSCOPY      Family History  Problem Relation Age of Onset  . Lymphoma Mother     nhl-mantle cell  . Lymphoma Father     nhl  . Colon cancer Neg Hx   . Esophageal cancer Neg Hx   . Stomach cancer Neg Hx   . Rectal cancer Neg Hx     Social History   Social History  . Marital status: Married    Spouse name: N/A  . Number of children: N/A  . Years of education: N/A   Social History Main Topics  . Smoking status: Never Smoker  . Smokeless tobacco: Never Used  . Alcohol use 0.6 oz/week    1 Glasses of wine per week     Comment: rare  . Drug use: No  . Sexual activity: Not Asked   Other Topics Concern  . None   Social History Narrative  Works at 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  denosumab (PROLIA) 60 MG/ML SOLN injection, Inject 60 mg into the skin every 6 (six) months. Administer in upper arm, thigh, or abdomen, Disp: , Rfl:  .  fish oil-omega-3 fatty acids 1000 MG capsule, Take 1 g by mouth daily., Disp: , Rfl:  .  Multiple Vitamin (MULTIVITAMIN) tablet, Take 1 tablet by mouth daily., Disp: , Rfl:  .  Vitamin D, Ergocalciferol, (DRISDOL) 50000 UNITS CAPS, Take 1 capsule (50,000 Units total) by mouth every 7 (seven) days., Disp: 30 capsule, Rfl: 1 .  vitamin E 400 UNIT capsule, Take 400 Units by mouth daily., Disp: , Rfl:  .  lisinopril (PRINIVIL,ZESTRIL) 5 MG tablet, Take 1  tablet (5 mg total) by mouth daily., Disp: 30 tablet, Rfl: 3 .  pravastatin (PRAVACHOL) 20 MG tablet, Take 1 tablet (20 mg total) by mouth daily., Disp: 30 tablet, Rfl: 3  EXAM:  Vitals:   12/29/15 1602  BP: (!) 142/78  Pulse: 85  Temp: 98.2 F (36.8 C)    Body mass index is 29.04 kg/m.  GENERAL: vitals reviewed and listed above, alert, oriented, appears well hydrated and in no acute distress  HEENT: atraumatic, conjunttiva clear, no obvious abnormalities on inspection of external nose and ears  NECK: no obvious masses on inspection  LUNGS: clear to auscultation bilaterally, no wheezes, rales or rhonchi, good air movement  CV: HRRR, no peripheral edema  MS: moves all extremities without noticeable abnormality - bruising and swelling L middle digit, difuse TTP with most sig pain in mid phalange; normal ROM and cap refill  PSYCH: pleasant and cooperative, no obvious depression or anxiety  ASSESSMENT AND PLAN:  Discussed the following assessment and plan:  Essential hypertension -opted to restart lisinopril -she had recent labs with gyn -follow up in 3-4 month and monitor at home in interim  HLD (hyperlipidemia) -opted to start statin after discussion risks/benefits -follow up 3-4 months  Finger pain, left - Plan: DG Hand Complete Left -potential fx, plain films pending -splint with buddy taping applied  Vitamin D deficiency -gyn treating  -Patient advised to return or notify a doctor immediately if symptoms worsen or persist or new concerns arise.  Patient Instructions  BEFORE YOU LEAVE: -follow up: in 3-4 months; come fasting for labs the same day -xray sheet  Start the lisinopril for blood pressure and the pravastatin for the cholesterol and take daily.  Get the xray of the finger tomorrow.  We recommend the following healthy lifestyle: 1) Small portions - eat off of salad plate instead of dinner plate 2) Eat a healthy clean diet with avoidance of (less  then 1 serving per week) processed foods, sweetened drinks, white starches, red meat, fast foods and sweets and consisting of: * 5-9 servings per day of fresh or frozen fruits and vegetables (not corn or potatoes, not dried or canned) *nuts and seeds, beans *olives and olive oil *small portions of lean meats such as fish and white chicken  *small portions of whole grains 3)Get at least 150 minutes of sweaty aerobic exercise per week 4)reduce stress - counseling, meditation, relaxation to balance other aspects of your life     Colin Benton R., DO

## 2015-12-29 NOTE — Patient Instructions (Signed)
BEFORE YOU LEAVE: -follow up: in 3-4 months; come fasting for labs the same day -xray sheet  Start the lisinopril for blood pressure and the pravastatin for the cholesterol and take daily.  Get the xray of the finger tomorrow.  We recommend the following healthy lifestyle: 1) Small portions - eat off of salad plate instead of dinner plate 2) Eat a healthy clean diet with avoidance of (less then 1 serving per week) processed foods, sweetened drinks, white starches, red meat, fast foods and sweets and consisting of: * 5-9 servings per day of fresh or frozen fruits and vegetables (not corn or potatoes, not dried or canned) *nuts and seeds, beans *olives and olive oil *small portions of lean meats such as fish and white chicken  *small portions of whole grains 3)Get at least 150 minutes of sweaty aerobic exercise per week 4)reduce stress - counseling, meditation, relaxation to balance other aspects of your life

## 2015-12-30 ENCOUNTER — Ambulatory Visit (INDEPENDENT_AMBULATORY_CARE_PROVIDER_SITE_OTHER)
Admission: RE | Admit: 2015-12-30 | Discharge: 2015-12-30 | Disposition: A | Discharge status: Home / Self Care | Payer: 59 | Source: Ambulatory Visit | Attending: Family Medicine | Admitting: Family Medicine

## 2015-12-30 DIAGNOSIS — M7989 Other specified soft tissue disorders: Secondary | ICD-10-CM | POA: Diagnosis not present

## 2015-12-30 DIAGNOSIS — M79642 Pain in left hand: Secondary | ICD-10-CM | POA: Diagnosis not present

## 2015-12-30 DIAGNOSIS — M79645 Pain in left finger(s): Secondary | ICD-10-CM

## 2016-01-18 DIAGNOSIS — F429 Obsessive-compulsive disorder, unspecified: Secondary | ICD-10-CM | POA: Diagnosis not present

## 2016-02-22 DIAGNOSIS — L812 Freckles: Secondary | ICD-10-CM | POA: Diagnosis not present

## 2016-02-22 DIAGNOSIS — L821 Other seborrheic keratosis: Secondary | ICD-10-CM | POA: Diagnosis not present

## 2016-02-22 DIAGNOSIS — D1801 Hemangioma of skin and subcutaneous tissue: Secondary | ICD-10-CM | POA: Diagnosis not present

## 2016-03-13 MED FILL — VIT D2 1.25 MG (50,000 UNIT: 1.25 MG | 27 days supply | Qty: 4 | Fill #1

## 2016-04-19 DIAGNOSIS — E559 Vitamin D deficiency, unspecified: Secondary | ICD-10-CM | POA: Diagnosis not present

## 2016-04-21 NOTE — Progress Notes (Signed)
HPI:  [name] is a pleasant 60 yo with a PMH HTN, HLD, Vit D def, IBD, osteoporosis, elevated BMI and poor follow up/compliance here for a follow up visit. She has had some mild upper respiratory symptoms last few days, nasal congestion, postnasal drip, sore throat. No fevers, nausea, vomiting, rash, shortness of breath. Her medications for her chronic conditions  include pravastatin, lisinopril, fish oil vit D and prolia. She admits her diet is poor. She does get over 10,000 steps a day, sometimes over 20,000 with her work. Labs in august looked good. Sees gynecologist for her bone health. Sees GI for bone health. No reported falls, CP, SOB, swelling, palpitations, changes in bowels, melena, hematochezia, fevers.  ROS: See pertinent positives and negatives per HPI.  Past Medical History:  Diagnosis Date  . B12 DEFICIENCY 02/09/2009   Qualifier: Diagnosis of  By: Ronnald Ramp RN, CGRN, Sheri    . Blood in stool   . COLONIC POLYPS, HYPERPLASTIC, HX OF 02/03/2009   Qualifier: Diagnosis of  By: Surface RN, Butch Penny    . DIVERTICULOSIS, COLON 02/03/2009   Qualifier: Diagnosis of  By: Varney Daily RN, Butch Penny    . Dyslipidemia 06/20/2012  . Fracture, intertrochanteric, left femur ([place]) 05/02/2012  . GERD (gastroesophageal reflux disease)   . Hearing loss    Bil/has hearing aids  . Hemorrhoids   . HEMORRHOIDS 02/03/2009   Qualifier: Diagnosis of  By: Varney Daily RN, Butch Penny    . Hypertension   . INFLAMMATORY BOWEL DISEASE - Followed by Dr. Sharlett Iles in GI 02/03/2009   Qualifier: Diagnosis of  By: Varney Daily RN, Butch Penny    . Osteopenia   . Turner's syndrome    was on Provera and Premarin and d/c this  at age 36yr  . Ulcerative colitis     Past Surgical History:  Procedure Laterality Date  . COLONOSCOPY    . FRACTURE SURGERY  2004   fx lt thumb  . HERNIA REPAIR     lt ing   . INTRAMEDULLARY (IM) NAIL INTERTROCHANTERIC  05/02/2012   Procedure: INTRAMEDULLARY (IM) NAIL INTERTROCHANTRIC;  Surgeon: Johnny Bridge, MD;  Location: [facility];  Service: Orthopedics;  Laterality: Left;  . ORIF FINGER FRACTURE  06/14/2011   Procedure: OPEN REDUCTION INTERNAL FIXATION (ORIF) METACARPAL (FINGER) FRACTURE;  Surgeon: Tennis Must, MD;  Location: [facility];  Service: Orthopedics;  Laterality: Right;  right ring  . TONSILLECTOMY    . UPPER GASTROINTESTINAL ENDOSCOPY      Family History  Problem Relation Age of Onset  . Lymphoma Mother     nhl-mantle cell  . Lymphoma Father     nhl  . Colon cancer Neg Hx   . Esophageal cancer Neg Hx   . Stomach cancer Neg Hx   . Rectal cancer Neg Hx     Social History   Social History  . Marital status: Married    Spouse name: N/A  . Number of children: N/A  . Years of education: N/A   Social History Main Topics  . Smoking status: Never Smoker  . Smokeless tobacco: Never Used  . Alcohol use 0.6 oz/week    1 Glasses of wine per week     Comment: rare  . Drug use: No  . Sexual activity: Not Asked   Other Topics Concern  . None   Social History Narrative   Works at 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  denosumab (PROLIA) 60 MG/ML SOLN injection, Inject 60 mg into the skin every 6 (six) months. Administer in upper arm, thigh, or abdomen, Disp: , Rfl:  .  fish oil-omega-3 fatty acids 1000 MG capsule, Take 1 g by mouth daily., Disp: , Rfl:  .  lisinopril (PRINIVIL,ZESTRIL) 5 MG tablet, Take 1 tablet (5 mg total) by mouth daily., Disp: 30 tablet, Rfl: 3 .  Multiple Vitamin (MULTIVITAMIN) tablet, Take 1 tablet by mouth daily., Disp: , Rfl:  .  pravastatin (PRAVACHOL) 20 MG tablet, Take 1 tablet (20 mg total) by mouth daily., Disp: 30 tablet, Rfl: 3 .  Vitamin D, Ergocalciferol, (DRISDOL) 50000 UNITS CAPS, Take 1 capsule (50,000 Units total) by mouth every 7 (seven) days., Disp: 30 capsule, Rfl: 1 .  vitamin  E 400 UNIT capsule, Take 400 Units by mouth daily., Disp: , Rfl:   EXAM:  Vitals:   04/23/16 0818  BP: (!) 144/70  Pulse: 76  Temp: 98.8 F (37.1 C)    Body mass index is 29.5 kg/m.  GENERAL: vitals reviewed and listed above, alert, oriented, appears well hydrated and in no acute distress  HEENT: atraumatic, conjunttiva clear, no obvious abnormalities on inspection of external nose and ears  NECK: no obvious masses on inspection  LUNGS: clear to auscultation bilaterally, no wheezes, rales or rhonchi, good air movement  CV: HRRR, no peripheral edema  MS: moves all extremities without noticeable abnormality  PSYCH: pleasant and cooperative, no obvious depression or anxiety  ASSESSMENT AND PLAN:  Discussed the following assessment and plan:  Essential hypertension - Plan: Basic metabolic panel, CBC (no diff)  Dyslipidemia  Osteopenia, unspecified location  Supportive care precautions advised for the respiratory symptoms, which are likely a mild viral upper respiratory infection Increase lisinopril to 10 mg Lifestyle recommendations, she is getting a lot of activity, but has room to improve her diet  Labs today  -Patient advised to return or notify a doctor immediately if symptoms worsen or persist or new concerns arise.  Patient Instructions  BEFORE YOU LEAVE: -labs -follow up: 3 months for hypertension  Increase lisinopril to 10 mg daily  We have ordered labs or studies at this visit. It can take up to 1-2 weeks for results and processing. IF results require follow up or explanation, we will call you with instructions. Clinically stable results will be released to your [facility]. If you have not heard from [place] or cannot find your results in [facility] in 2 weeks please contact our office at [phone].  If you are not yet signed up for [facility], please consider signing up.   We recommend the following healthy lifestyle for LIFE: 1) Small portions.   Tip: eat off of  a salad plate instead of a dinner plate.  Tip: if you need more or a snack choose fruits, veggies and/or a handful of nuts or seeds.  2) Eat a healthy clean diet.  * Tip: Avoid (less then 1 serving per week): processed foods, sweets, sweetened drinks, white starches (rice, flour, bread, potatoes, pasta, etc), red meat, fast foods, butter  *Tip: CHOOSE instead   * 5-9 servings per day of fresh or frozen fruits and vegetables (but not corn, potatoes, bananas, canned or dried fruit)   *nuts and seeds, beans   *olives and olive oil   *small portions of lean meats such as  fish and white chicken    *small portions of whole grains  3)Get at least 150 minutes of sweaty aerobic exercise per week.  4)Reduce stress - consider counseling, meditation and relaxation to balance other aspects of your life.           Colin Benton R., DO

## 2016-04-23 ENCOUNTER — Ambulatory Visit (INDEPENDENT_AMBULATORY_CARE_PROVIDER_SITE_OTHER): Payer: 59 | Admitting: Family Medicine

## 2016-04-23 ENCOUNTER — Encounter: Payer: Self-pay | Admitting: Family Medicine

## 2016-04-23 VITALS — BP 144/70 | HR 76 | Temp 98.8°F | Ht <= 58 in | Wt 136.3 lb

## 2016-04-23 DIAGNOSIS — J069 Acute upper respiratory infection, unspecified: Secondary | ICD-10-CM | POA: Diagnosis not present

## 2016-04-23 DIAGNOSIS — I10 Essential (primary) hypertension: Secondary | ICD-10-CM

## 2016-04-23 DIAGNOSIS — M8580 Other specified disorders of bone density and structure, unspecified site: Secondary | ICD-10-CM

## 2016-04-23 DIAGNOSIS — E785 Hyperlipidemia, unspecified: Secondary | ICD-10-CM

## 2016-04-23 MED ORDER — LISINOPRIL 10 MG PO TABS: 10.0000 mg | ORAL_TABLET | Freq: Every day | ORAL | 1 refills | Status: DC

## 2016-04-23 MED FILL — VIT D2 1.25 MG (50,000 UNIT: 1.25 MG | 84 days supply | Qty: 12 | Fill #0

## 2016-04-23 MED FILL — LISINOPRIL 10 MG TABLET: 10 | 90 days supply | Qty: 90 | Fill #0

## 2016-04-23 NOTE — Patient Instructions (Addendum)
BEFORE YOU LEAVE: -labs -follow up: 3 months for hypertension  Increase lisinopril to 10 mg daily  We have ordered labs or studies at this visit. It can take up to 1-2 weeks for results and processing. IF results require follow up or explanation, we will call you with instructions. Clinically stable results will be released to your [facility]. If you have not heard from [place] or cannot find your results in [facility] in 2 weeks please contact our office at [phone].  If you are not yet signed up for [facility], please consider signing up.   We recommend the following healthy lifestyle for LIFE: 1) Small portions.   Tip: eat off of a salad plate instead of a dinner plate.  Tip: if you need more or a snack choose fruits, veggies and/or a handful of nuts or seeds.  2) Eat a healthy clean diet.  * Tip: Avoid (less then 1 serving per week): processed foods, sweets, sweetened drinks, white starches (rice, flour, bread, potatoes, pasta, etc), red meat, fast foods, butter  *Tip: CHOOSE instead   * 5-9 servings per day of fresh or frozen fruits and vegetables (but not corn, potatoes, bananas, canned or dried fruit)   *nuts and seeds, beans   *olives and olive oil   *small portions of lean meats such as fish and white chicken    *small portions of whole grains  3)Get at least 150 minutes of sweaty aerobic exercise per week.  4)Reduce stress - consider counseling, meditation and relaxation to balance other aspects of your life.

## 2016-04-23 NOTE — Progress Notes (Signed)
Pre visit review using our clinic review tool, if applicable. No additional management support is needed unless otherwise documented below in the visit note. 

## 2016-04-30 DIAGNOSIS — H524 Presbyopia: Secondary | ICD-10-CM | POA: Diagnosis not present

## 2016-04-30 DIAGNOSIS — H5203 Hypermetropia, bilateral: Secondary | ICD-10-CM | POA: Diagnosis not present

## 2016-05-08 ENCOUNTER — Telehealth: Payer: Self-pay | Admitting: *Deleted

## 2016-05-08 NOTE — Telephone Encounter (Signed)
Appt scheduled to complete the physical form per Dr Maudie Mercury.

## 2016-05-17 ENCOUNTER — Encounter: Payer: Self-pay | Admitting: Family Medicine

## 2016-05-17 ENCOUNTER — Ambulatory Visit (INDEPENDENT_AMBULATORY_CARE_PROVIDER_SITE_OTHER): Payer: 59 | Admitting: Family Medicine

## 2016-05-17 VITALS — BP 122/68 | HR 95 | Temp 97.5°F | Ht <= 58 in | Wt 138.1 lb

## 2016-05-17 DIAGNOSIS — E785 Hyperlipidemia, unspecified: Secondary | ICD-10-CM

## 2016-05-17 DIAGNOSIS — I10 Essential (primary) hypertension: Secondary | ICD-10-CM | POA: Diagnosis not present

## 2016-05-17 DIAGNOSIS — Z0000 Encounter for general adult medical examination without abnormal findings: Secondary | ICD-10-CM | POA: Diagnosis not present

## 2016-05-17 MED ORDER — LISINOPRIL 10 MG PO TABS: 10.0000 mg | ORAL_TABLET | Freq: Every day | ORAL | 3 refills | Status: DC

## 2016-05-17 MED ORDER — PRAVASTATIN SODIUM 20 MG PO TABS: 20.0000 mg | ORAL_TABLET | Freq: Every day | ORAL | 3 refills | Status: DC

## 2016-05-17 NOTE — Progress Notes (Signed)
Pre visit review using our clinic review tool, if applicable. No additional management support is needed unless otherwise documented below in the visit note. 

## 2016-05-17 NOTE — Patient Instructions (Addendum)
BEFORE YOU LEAVE: -labs -complete, copy and give form to patient -please provide her with printed prescriptions for her medications (lisinopril and pravastatin) 90 days -follow up: as scheduled   We recommend the following healthy lifestyle for LIFE: 1) Small portions.   Tip: eat off of a salad plate instead of a dinner plate.  Tip: if you need more or a snack choose fruits, veggies and/or a handful of nuts or seeds.  2) Eat a healthy clean diet.  * Tip: Avoid (less then 1 serving per week): processed foods, sweets, sweetened drinks, white starches (rice, flour, bread, potatoes, pasta, etc), red meat, fast foods, butter  *Tip: CHOOSE instead   * 5-9 servings per day of fresh or frozen fruits and vegetables (but not corn, potatoes, bananas, canned or dried fruit)   *nuts and seeds, beans   *olives and olive oil   *small portions of lean meats such as fish and white chicken    *small portions of whole grains  3)Get at least 150 minutes of sweaty aerobic exercise per week.  4)Reduce stress - consider counseling, meditation and relaxation to balance other aspects of your life.

## 2016-05-17 NOTE — Progress Notes (Signed)
HPI:  Here for CPE:  -Concerns and/or follow up today:   Hx HTN, HLD, IBD, osteoporosis, elevated bmi and poor compliance here for her physical.  She has a form she wishes for [place] to complete. She showed up > 15 minutes late, but we worked her in. BP doing better on medicaion  -Diet: variety of foods, balance and well rounded, larger portion sizes  -Exercise: 10,000 steps per day  -Taking folic acid, vitamin D or calcium: no  -Diabetes and Dyslipidemia Screening:  -Hx of HTN: no  -Vaccines: UTD  -pap history: sees gyn  -sexual activity: yes, female partner, no new partners  -wants STI testing (Hep C if born [birth year]): no  -FH breast, colon or ovarian ca: see FH Last mammogram: sees gyn for breast, women's health  Last colon cancer screening: 03/2013 - reports will be repeating soon - see gi  Sees gyn for bone health.  -Alcohol, Tobacco, drug use: see social history  Review of Systems - no fevers, unintentional weight loss, vision loss, hearing loss, chest pain, sob, hemoptysis, melena, hematochezia, hematuria, genital discharge, changing or concerning skin lesions, bleeding, bruising, loc, thoughts of self harm or SI  Past Medical History:  Diagnosis Date  . B12 DEFICIENCY 02/09/2009   Qualifier: Diagnosis of  By: Ronnald Ramp RN, CGRN, Sheri    . Blood in stool   . COLONIC POLYPS, HYPERPLASTIC, HX OF 02/03/2009   Qualifier: Diagnosis of  By: Surface RN, Butch Penny    . DIVERTICULOSIS, COLON 02/03/2009   Qualifier: Diagnosis of  By: Varney Daily RN, Butch Penny    . Dyslipidemia 06/20/2012  . Fracture, intertrochanteric, left femur ([place]) 05/02/2012  . GERD (gastroesophageal reflux disease)   . Hearing loss    Bil/has hearing aids  . Hemorrhoids   . HEMORRHOIDS 02/03/2009   Qualifier: Diagnosis of  By: Varney Daily RN, Butch Penny    . Hypertension   . INFLAMMATORY BOWEL DISEASE - Followed by Dr. Sharlett Iles in GI 02/03/2009   Qualifier: Diagnosis of  By: Varney Daily RN, Butch Penny    . Osteopenia   . Turner's  syndrome    was on Provera and Premarin and d/c this  at age 48yr  . Ulcerative colitis     Past Surgical History:  Procedure Laterality Date  . COLONOSCOPY    . FRACTURE SURGERY  2004   fx lt thumb  . HERNIA REPAIR     lt ing   . INTRAMEDULLARY (IM) NAIL INTERTROCHANTERIC  05/02/2012   Procedure: INTRAMEDULLARY (IM) NAIL INTERTROCHANTRIC;  Surgeon: Johnny Bridge, MD;  Location: [facility];  Service: Orthopedics;  Laterality: Left;  . ORIF FINGER FRACTURE  06/14/2011   Procedure: OPEN REDUCTION INTERNAL FIXATION (ORIF) METACARPAL (FINGER) FRACTURE;  Surgeon: Tennis Must, MD;  Location: [facility];  Service: Orthopedics;  Laterality: Right;  right ring  . TONSILLECTOMY    . UPPER GASTROINTESTINAL ENDOSCOPY      Family History  Problem Relation Age of Onset  . Lymphoma Mother     nhl-mantle cell  . Lymphoma Father     nhl  . Colon cancer Neg Hx   . Esophageal cancer Neg Hx   . Stomach cancer Neg Hx   . Rectal cancer Neg Hx     Social History   Social History  . Marital status: Married    Spouse name: N/A  . Number of children: N/A  . Years of education: N/A   Social History Main Topics  . Smoking status: Never Smoker  .  Smokeless tobacco: Never Used  . Alcohol use 0.6 oz/week    1 Glasses of wine per week     Comment: rare  . Drug use: No  . Sexual activity: Not Asked   Other Topics Concern  . None   Social History Narrative   Works at 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  denosumab (PROLIA) 60 MG/ML SOLN injection, Inject 60 mg into the skin every 6 (six) months. Administer in upper arm, thigh, or abdomen, Disp: , Rfl:  .  fish oil-omega-3 fatty acids 1000 MG capsule, Take 1 g by mouth daily., Disp: , Rfl:  .  lisinopril (PRINIVIL,ZESTRIL) 10 MG tablet, Take 1 tablet (10 mg total) by mouth daily., Disp: 90  tablet, Rfl: 1 .  Multiple Vitamin (MULTIVITAMIN) tablet, Take 1 tablet by mouth daily., Disp: , Rfl:  .  pravastatin (PRAVACHOL) 20 MG tablet, Take 1 tablet (20 mg total) by mouth daily., Disp: 30 tablet, Rfl: 3 .  Vitamin D, Ergocalciferol, (DRISDOL) 50000 UNITS CAPS, Take 1 capsule (50,000 Units total) by mouth every 7 (seven) days., Disp: 30 capsule, Rfl: 1 .  vitamin E 400 UNIT capsule, Take 400 Units by mouth daily., Disp: , Rfl:   EXAM:  Vitals:   05/17/16 1539  BP: 122/68  Pulse: 95  Temp: 97.5 F (36.4 C)  Body mass index is 30.69 kg/m.  GENERAL: vitals reviewed and listed below, alert, oriented, appears well hydrated and in no acute distress  HEENT: head atraumatic, PERRLA, normal appearance of eyes, ears, nose and mouth. moist mucus membranes.  NECK: supple, no masses or lymphadenopathy  LUNGS: clear to auscultation bilaterally, no rales, rhonchi or wheeze  CV: HRRR, no peripheral edema or cyanosis, normal pedal pulses  ABDOMEN: bowel sounds normal, soft, non tender to palpation, no masses, no rebound or guarding  SKIN: no rash or abnormal lesions  MS: normal gait, moves all extremities normally  GU/BREAST: declined - does with gyn  NEURO: normal gait, speech and thought processing grossly intact, muscle tone grossly intact throughout  PSYCH: normal affect, pleasant and cooperative  ASSESSMENT AND PLAN:  Discussed the following assessment and plan:  Encounter for preventive health examination - Plan: Basic metabolic panel, CBC, Cholesterol, Total, HDL cholesterol  Essential hypertension  Dyslipidemia  -form completed  -Discussed and advised all [place] preventive services health task force level A and B recommendations for age, sex and risks.  -Advised at least 150 minutes of exercise per week and a healthy diet with avoidance of (less then 1 serving per week) processed foods, white starches, red meat, fast foods and sweets and consisting of: * 5-9 servings  of fresh fruits and vegetables (not corn or potatoes) *nuts and seeds, beans *olives and olive oil *lean meats such as fish and white chicken  *whole grains  -labs, studies and vaccines per orders this encounter  Orders Placed This Encounter  Procedures  . Basic metabolic panel  . CBC  . Cholesterol, Total  . HDL cholesterol    Patient advised to return to clinic immediately if symptoms worsen or persist or new concerns.  Patient Instructions  BEFORE YOU LEAVE: -labs -complete, copy and give form to patient -please provide her with printed prescriptions for her medications (lisinopril and pravastatin) 90 days -follow up: as  scheduled   We recommend the following healthy lifestyle for LIFE: 1) Small portions.   Tip: eat off of a salad plate instead of a dinner plate.  Tip: if you need more or a snack choose fruits, veggies and/or a handful of nuts or seeds.  2) Eat a healthy clean diet.  * Tip: Avoid (less then 1 serving per week): processed foods, sweets, sweetened drinks, white starches (rice, flour, bread, potatoes, pasta, etc), red meat, fast foods, butter  *Tip: CHOOSE instead   * 5-9 servings per day of fresh or frozen fruits and vegetables (but not corn, potatoes, bananas, canned or dried fruit)   *nuts and seeds, beans   *olives and olive oil   *small portions of lean meats such as fish and white chicken    *small portions of whole grains  3)Get at least 150 minutes of sweaty aerobic exercise per week.  4)Reduce stress - consider counseling, meditation and relaxation to balance other aspects of your life.     No Follow-up on file.  Colin Benton R., DO

## 2016-05-17 NOTE — Addendum Note (Signed)
Addended by: Agnes Lawrence on: 05/17/2016 04:07 PM   Modules accepted: Orders

## 2016-05-28 MED FILL — PRAVASTATIN SODIUM 20 MG TA: 20 | 30 days supply | Qty: 30 | Fill #1

## 2016-06-12 DIAGNOSIS — M810 Age-related osteoporosis without current pathological fracture: Secondary | ICD-10-CM | POA: Diagnosis not present

## 2016-06-12 MED FILL — PROLIA 60 MG/ML SOLN: 60 | 30 days supply | Qty: 1 | Fill #0

## 2016-07-11 ENCOUNTER — Ambulatory Visit (INDEPENDENT_AMBULATORY_CARE_PROVIDER_SITE_OTHER): Payer: 59 | Admitting: Adult Health

## 2016-07-11 ENCOUNTER — Telehealth: Payer: Self-pay | Admitting: Family Medicine

## 2016-07-11 ENCOUNTER — Other Ambulatory Visit: Payer: Self-pay

## 2016-07-11 ENCOUNTER — Encounter: Payer: Self-pay | Admitting: Adult Health

## 2016-07-11 VITALS — BP 164/70 | Temp 98.4°F | Ht <= 58 in | Wt 139.0 lb

## 2016-07-11 DIAGNOSIS — R05 Cough: Secondary | ICD-10-CM | POA: Diagnosis not present

## 2016-07-11 DIAGNOSIS — R059 Cough, unspecified: Secondary | ICD-10-CM

## 2016-07-11 MED ORDER — BENZONATATE 200 MG PO CAPS: 200.0000 mg | ORAL_CAPSULE | Freq: Three times a day (TID) | ORAL | 0 refills | Status: DC | PRN

## 2016-07-11 MED ORDER — HYDROCODONE-HOMATROPINE 5-1.5 MG/5ML PO SYRP: 5.0000 mL | ORAL_SOLUTION | Freq: Three times a day (TID) | ORAL | 0 refills | Status: DC | PRN

## 2016-07-11 NOTE — Telephone Encounter (Signed)
Rx has been refilled to preferred location.

## 2016-07-11 NOTE — Progress Notes (Signed)
Subjective:    Patient ID: [name], female    DOB: [birth year], 61 y.o.   MRN: [id number]  Cough  This is a new problem. The current episode started in the past 7 days. The problem has been unchanged. The cough is non-productive. Pertinent negatives include no chest pain, ear congestion, ear pain, fever, headaches, heartburn, nasal congestion, postnasal drip, rhinorrhea, shortness of breath or wheezing. Nothing aggravates the symptoms. She has tried nothing (mucinex) for the symptoms. The treatment provided mild relief. There is no history of asthma, bronchitis, COPD, environmental allergies or pneumonia.    Review of Systems  Constitutional: Negative for activity change, appetite change, fatigue and fever.  HENT: Negative for congestion, ear pain, postnasal drip and rhinorrhea.   Respiratory: Positive for cough. Negative for chest tightness, shortness of breath and wheezing.   Cardiovascular: Negative for chest pain.  Gastrointestinal: Negative for heartburn.  Allergic/Immunologic: Negative for environmental allergies.  Neurological: Negative for headaches.   Past Medical History:  Diagnosis Date  . B12 DEFICIENCY 02/09/2009   Qualifier: Diagnosis of  By: Ronnald Ramp RN, CGRN, Sheri    . Blood in stool   . COLONIC POLYPS, HYPERPLASTIC, HX OF 02/03/2009   Qualifier: Diagnosis of  By: Surface RN, Butch Penny    . DIVERTICULOSIS, COLON 02/03/2009   Qualifier: Diagnosis of  By: Varney Daily RN, Butch Penny    . Dyslipidemia 06/20/2012  . Fracture, intertrochanteric, left femur ([place]) 05/02/2012  . GERD (gastroesophageal reflux disease)   . Hearing loss    Bil/has hearing aids  . Hemorrhoids   . HEMORRHOIDS 02/03/2009   Qualifier: Diagnosis of  By: Varney Daily RN, Butch Penny    . Hypertension   . INFLAMMATORY BOWEL DISEASE - Followed by Dr. Sharlett Iles in GI 02/03/2009   Qualifier: Diagnosis of  By: Varney Daily RN, Butch Penny    . Osteopenia   . Turner's syndrome    was on Provera and Premarin and d/c this  at age 29yr    . Ulcerative colitis     Social History   Social History  . Marital status: Married    Spouse name: N/A  . Number of children: N/A  . Years of education: N/A   Occupational History  . Not on file.   Social History Main Topics  . Smoking status: Never Smoker  . Smokeless tobacco: Never Used  . Alcohol use 0.6 oz/week    1 Glasses of wine per week     Comment: rare  . Drug use: No  . Sexual activity: Not on file   Other Topics Concern  . Not on file   Social History Narrative   Works at 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 COLONOSCOPY    . FRACTURE SURGERY  2004   fx lt thumb  . HERNIA REPAIR     lt ing   . INTRAMEDULLARY (IM) NAIL INTERTROCHANTERIC  05/02/2012   Procedure: INTRAMEDULLARY (IM) NAIL INTERTROCHANTRIC;  Surgeon: Johnny Bridge, MD;  Location: [facility];  Service: Orthopedics;  Laterality: Left;  . ORIF FINGER FRACTURE  06/14/2011   Procedure: OPEN REDUCTION INTERNAL FIXATION (ORIF) METACARPAL (FINGER) FRACTURE;  Surgeon: Tennis Must, MD;  Location: [facility];  Service: Orthopedics;  Laterality: Right;  right ring  . TONSILLECTOMY    .  UPPER GASTROINTESTINAL ENDOSCOPY      Family History  Problem Relation Age of Onset  . Lymphoma Mother     nhl-mantle cell  . Lymphoma Father     nhl  . Colon cancer Neg Hx   . Esophageal cancer Neg Hx   . Stomach cancer Neg Hx   . Rectal cancer Neg Hx     No Known Allergies  Current Outpatient Prescriptions on File Prior to Visit  Medication Sig Dispense Refill  . denosumab (PROLIA) 60 MG/ML SOLN injection Inject 60 mg into the skin every 6 (six) months. Administer in upper arm, thigh, or abdomen    . fish oil-omega-3 fatty acids 1000 MG capsule Take 1 g by mouth daily.    Marland Kitchen lisinopril (PRINIVIL,ZESTRIL) 10 MG tablet Take 1 tablet (10 mg total)  by mouth daily. 90 tablet 3  . Multiple Vitamin (MULTIVITAMIN) tablet Take 1 tablet by mouth daily.    . pravastatin (PRAVACHOL) 20 MG tablet Take 1 tablet (20 mg total) by mouth daily. 90 tablet 3  . Vitamin D, Ergocalciferol, (DRISDOL) 50000 UNITS CAPS Take 1 capsule (50,000 Units total) by mouth every 7 (seven) days. 30 capsule 1  . vitamin E 400 UNIT capsule Take 400 Units by mouth daily.     No current facility-administered medications on file prior to visit.     BP (!) 164/70   Temp 98.4 F (36.9 C) (Oral)   Ht 4' 8.25" (1.429 m)   Wt 139 lb (63 kg)   BMI 30.89 kg/m       Objective:   Physical Exam  Constitutional: She is oriented to person, place, and time. She appears well-developed and well-nourished. No distress.  HENT:  Head: Normocephalic and atraumatic.  Right Ear: External ear normal.  Left Ear: External ear normal.  Nose: Nose normal.  Mouth/Throat: Oropharynx is clear and moist. No oropharyngeal exudate.  Neck: Normal range of motion. Neck supple. No thyromegaly present.  Cardiovascular: Normal rate, regular rhythm, normal heart sounds and intact distal pulses.  Exam reveals no gallop and no friction rub.   No murmur heard. Pulmonary/Chest: Effort normal and breath sounds normal. No respiratory distress. She has no wheezes. She has no rales. She exhibits no tenderness.  Lymphadenopathy:    She has no cervical adenopathy.  Neurological: She is alert and oriented to person, place, and time.  Skin: Skin is warm and dry. No rash noted. She is not diaphoretic. No erythema. No pallor.  Psychiatric: She has a normal mood and affect. Her behavior is normal. Judgment and thought content normal.  Nursing note and vitals reviewed.     Assessment & Plan:  1. Cough - Appears to be viral in nature. No signs of bronchitis or pneumonia.  - benzonatate (TESSALON) 200 MG capsule; Take 1 capsule (200 mg total) by mouth 3 (three) times daily as needed for cough.  Dispense: 20  capsule; Refill: 0 - HYDROcodone-homatropine (HYCODAN) 5-1.5 MG/5ML syrup; Take 5 mLs by mouth every 8 (eight) hours as needed for cough.  Dispense: 120 mL; Refill: 0 - Follow up if no improvement   Dorothyann Peng, NP

## 2016-07-11 NOTE — Telephone Encounter (Signed)
°  Med was sent to [facility] outpatient and pt request med be sent th below pharmacy     Pt request refill of the following:  benzonatate (TESSALON) 200 MG capsule   Phamacy:  [facility]

## 2016-07-23 MED FILL — PRAVASTATIN SODIUM 20 MG TA: 20 | 90 days supply | Qty: 90 | Fill #0

## 2016-07-24 ENCOUNTER — Ambulatory Visit: Payer: Self-pay | Admitting: Family Medicine

## 2016-07-26 ENCOUNTER — Ambulatory Visit (INDEPENDENT_AMBULATORY_CARE_PROVIDER_SITE_OTHER): Payer: 59 | Admitting: Family Medicine

## 2016-07-26 ENCOUNTER — Encounter: Payer: Self-pay | Admitting: Family Medicine

## 2016-07-26 VITALS — BP 126/70 | HR 83 | Temp 98.6°F | Ht <= 58 in | Wt 140.1 lb

## 2016-07-26 DIAGNOSIS — Z6831 Body mass index (BMI) 31.0-31.9, adult: Secondary | ICD-10-CM

## 2016-07-26 DIAGNOSIS — I10 Essential (primary) hypertension: Secondary | ICD-10-CM

## 2016-07-26 DIAGNOSIS — R05 Cough: Secondary | ICD-10-CM | POA: Diagnosis not present

## 2016-07-26 DIAGNOSIS — R059 Cough, unspecified: Secondary | ICD-10-CM

## 2016-07-26 DIAGNOSIS — E785 Hyperlipidemia, unspecified: Secondary | ICD-10-CM

## 2016-07-26 DIAGNOSIS — K529 Noninfective gastroenteritis and colitis, unspecified: Secondary | ICD-10-CM | POA: Diagnosis not present

## 2016-07-26 NOTE — Patient Instructions (Signed)
BEFORE YOU LEAVE: -follow up: 1 month -xray sheet -labs  Get the chest xray.  Try nexium or Prilosec once daily for one week for the cough. These are available over the counter.  If this does not help the cough start flonase 2 sprays each nostril daily and zyrtec once daily.  We have ordered labs or studies at this visit. It can take up to 1-2 weeks for results and processing. IF results require follow up or explanation, we will call you with instructions. Clinically stable results will be released to your [facility]. If you have not heard from [place] or cannot find your results in [facility] in 2 weeks please contact our office at [phone].  If you are not yet signed up for [facility], please consider signing up.   We recommend the following healthy lifestyle for LIFE: 1) Small portions.   Tip: eat off of a salad plate instead of a dinner plate.  Tip: if you need more or a snack choose fruits, veggies and/or a handful of nuts or seeds.  2) Eat a healthy clean diet.  * Tip: Avoid (less then 1 serving per week): processed foods, sweets, sweetened drinks, white starches (rice, flour, bread, potatoes, pasta, etc), red meat, fast foods, butter  *Tip: CHOOSE instead   * 5-9 servings per day of fresh or frozen fruits and vegetables (but not corn, potatoes, bananas, canned or dried fruit)   *nuts and seeds, beans   *olives and olive oil   *small portions of lean meats such as fish and white chicken    *small portions of whole grains  3)Get at least 150 minutes of sweaty aerobic exercise per week.  4)Reduce stress - consider counseling, meditation and relaxation to balance other aspects of your life.  WE NOW OFFER   [facility] FAST TRACK!!!  SAME DAY Appointments for ACUTE CARE  Such as: Sprains, Injuries, cuts, abrasions, rashes, muscle pain, joint pain, back pain Colds, flu, sore throats, headache, allergies, cough, fever  Ear pain, sinus and eye infections Abdominal pain,  nausea, vomiting, diarrhea, upset stomach Animal/insect bites  3 Easy Ways to Schedule: Walk-In Scheduling Call in scheduling Mychart Sign-up: [email or web address]

## 2016-07-26 NOTE — Progress Notes (Signed)
Pre visit review using our clinic review tool, if applicable. No additional management support is needed unless otherwise documented below in the visit note. 

## 2016-07-26 NOTE — Progress Notes (Signed)
HPI:  [name] is a pleasant 61 y.o. here for follow up. Chronic medical problems summarized below were reviewed for changes and stability and were updated as needed below. These issues and their treatment remain stable for the most part. She has had a cough for a little over a month. She saw my colleague for this and has tried some Tessalon but this has not helped. She has a globus sensation at times. Has a history of acid reflux. She wonders if it could be allergies. Does not have a history of allergies. No fevers, shortness of breath, sinus pain, malaise, hemoptysis or thick mucus production. Denies CP, SOB, DOE, treatment intolerance or new symptoms.She is due for her regular labs.  HTN: -meds: lisinopril  HLD/elevated BMI: -meds: pravastatin, fish oil  Vit D Def/Osteoporosis: -on vit D supplement -prolia with her gynecologist, Dr. Valentino Saxon  IBD/Hx colon polyps: -used to see Dr. Sharlett Iles -on [place] in the past then she refused tx from colon report -last colonoscopy 2014, recs to repeat in 2019  ROS: See pertinent positives and negatives per HPI.  Past Medical History:  Diagnosis Date  . B12 DEFICIENCY 02/09/2009   Qualifier: Diagnosis of  By: Ronnald Ramp RN, CGRN, Sheri    . Blood in stool   . COLONIC POLYPS, HYPERPLASTIC, HX OF 02/03/2009   Qualifier: Diagnosis of  By: Surface RN, Butch Penny    . DIVERTICULOSIS, COLON 02/03/2009   Qualifier: Diagnosis of  By: Varney Daily RN, Butch Penny    . Dyslipidemia 06/20/2012  . Fracture, intertrochanteric, left femur ([place]) 05/02/2012  . GERD (gastroesophageal reflux disease)   . Hearing loss    Bil/has hearing aids  . Hemorrhoids   . HEMORRHOIDS 02/03/2009   Qualifier: Diagnosis of  By: Varney Daily RN, Butch Penny    . Hypertension   . INFLAMMATORY BOWEL DISEASE - Followed by Dr. Sharlett Iles in GI 02/03/2009   Qualifier: Diagnosis of  By: Varney Daily RN, Butch Penny    . Osteopenia   . Turner's syndrome    was on Provera and Premarin and d/c this  at age 25yr  .  Ulcerative colitis     Past Surgical History:  Procedure Laterality Date  . COLONOSCOPY    . FRACTURE SURGERY  2004   fx lt thumb  . HERNIA REPAIR     lt ing   . INTRAMEDULLARY (IM) NAIL INTERTROCHANTERIC  05/02/2012   Procedure: INTRAMEDULLARY (IM) NAIL INTERTROCHANTRIC;  Surgeon: Johnny Bridge, MD;  Location: [facility];  Service: Orthopedics;  Laterality: Left;  . ORIF FINGER FRACTURE  06/14/2011   Procedure: OPEN REDUCTION INTERNAL FIXATION (ORIF) METACARPAL (FINGER) FRACTURE;  Surgeon: Tennis Must, MD;  Location: [facility];  Service: Orthopedics;  Laterality: Right;  right ring  . TONSILLECTOMY    . UPPER GASTROINTESTINAL ENDOSCOPY      Family History  Problem Relation Age of Onset  . Lymphoma Mother     nhl-mantle cell  . Lymphoma Father     nhl  . Colon cancer Neg Hx   . Esophageal cancer Neg Hx   . Stomach cancer Neg Hx   . Rectal cancer Neg Hx     Social History   Social History  . Marital status: Married    Spouse name: N/A  . Number of children: N/A  . Years of education: N/A   Social History Main Topics  . Smoking status: Never Smoker  . Smokeless tobacco: Never Used  . Alcohol use 0.6 oz/week    1 Glasses  of wine per week     Comment: rare  . Drug use: No  . Sexual activity: Not Asked   Other Topics Concern  . None   Social History Narrative   Works at 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  benzonatate (TESSALON) 200 MG capsule, Take 1 capsule (200 mg total) by mouth 3 (three) times daily as needed for cough., Disp: 20 capsule, Rfl: 0 .  denosumab (PROLIA) 60 MG/ML SOLN injection, Inject 60 mg into the skin every 6 (six) months. Administer in upper arm, thigh, or abdomen, Disp: , Rfl:  .  fish oil-omega-3 fatty acids 1000 MG capsule, Take 1 g by mouth daily., Disp: , Rfl:  .  lisinopril  (PRINIVIL,ZESTRIL) 10 MG tablet, Take 1 tablet (10 mg total) by mouth daily., Disp: 90 tablet, Rfl: 3 .  Multiple Vitamin (MULTIVITAMIN) tablet, Take 1 tablet by mouth daily., Disp: , Rfl:  .  pravastatin (PRAVACHOL) 20 MG tablet, Take 1 tablet (20 mg total) by mouth daily., Disp: 90 tablet, Rfl: 3 .  Vitamin D, Ergocalciferol, (DRISDOL) 50000 UNITS CAPS, Take 1 capsule (50,000 Units total) by mouth every 7 (seven) days., Disp: 30 capsule, Rfl: 1 .  vitamin E 400 UNIT capsule, Take 400 Units by mouth daily., Disp: , Rfl:   EXAM:  Vitals:   07/26/16 1644  BP: 126/70  Pulse: 83  Temp: 98.6 F (37 C)    Body mass index is 31.13 kg/m.  GENERAL: vitals reviewed and listed above, alert, oriented, appears well hydrated and in no acute distress  HEENT: atraumatic, conjunttiva clear, no obvious abnormalities on inspection of external nose and ears, normal appearance of ear canals and TMs, clear nasal congestion, mild post oropharyngeal erythema with PND, no tonsillar edema or exudate, no sinus TTP  NECK: no obvious masses on inspection  LUNGS: clear to auscultation bilaterally, no wheezes, rales or rhonchi, good air movement  CV: HRRR, no peripheral edema  MS: moves all extremities without noticeable abnormality  PSYCH: pleasant and cooperative, no obvious depression or anxiety  ASSESSMENT AND PLAN:  Discussed the following assessment and plan:  Cough - Plan: DG Chest 2 View  Essential hypertension - Plan: CBC, Basic metabolic panel  Dyslipidemia - Plan: HDL cholesterol, Cholesterol, total  BMI 31.0-31.9,adult  IBD (inflammatory bowel disease)  -Discussed potential etiologies for a cough, will get a chest x-ray, will treat acid reflux for 1 week as this may be silent reflux given her history. Then will try an allergy regimen as she does have some nasal congestion on exam and postnasal drip. Follow-up in one month. -Labs, lifestyle recommendations and continuation of current  treatment for her other chronic medical problems -Patient advised to return or notify a doctor immediately if symptoms worsen or persist or new concerns arise.  Patient Instructions  BEFORE YOU LEAVE: -follow up: 1 month -xray sheet -labs  Get the chest xray.  Try nexium or Prilosec once daily for one week for the cough. These are available over the counter.  If this does not help the cough start flonase 2 sprays each nostril daily and zyrtec once daily.  We have ordered labs or studies at this visit. It can take up to 1-2 weeks for results and processing. IF results require follow up or explanation,  we will call you with instructions. Clinically stable results will be released to your [facility]. If you have not heard from [place] or cannot find your results in [facility] in 2 weeks please contact our office at [phone].  If you are not yet signed up for [facility], please consider signing up.   We recommend the following healthy lifestyle for LIFE: 1) Small portions.   Tip: eat off of a salad plate instead of a dinner plate.  Tip: if you need more or a snack choose fruits, veggies and/or a handful of nuts or seeds.  2) Eat a healthy clean diet.  * Tip: Avoid (less then 1 serving per week): processed foods, sweets, sweetened drinks, white starches (rice, flour, bread, potatoes, pasta, etc), red meat, fast foods, butter  *Tip: CHOOSE instead   * 5-9 servings per day of fresh or frozen fruits and vegetables (but not corn, potatoes, bananas, canned or dried fruit)   *nuts and seeds, beans   *olives and olive oil   *small portions of lean meats such as fish and white chicken    *small portions of whole grains  3)Get at least 150 minutes of sweaty aerobic exercise per week.  4)Reduce stress - consider counseling, meditation and relaxation to balance other aspects of your life.  WE NOW OFFER   [facility] FAST TRACK!!!  SAME DAY Appointments for ACUTE CARE  Such as: Sprains,  Injuries, cuts, abrasions, rashes, muscle pain, joint pain, back pain Colds, flu, sore throats, headache, allergies, cough, fever  Ear pain, sinus and eye infections Abdominal pain, nausea, vomiting, diarrhea, upset stomach Animal/insect bites  3 Easy Ways to Schedule: Walk-In Scheduling Call in scheduling Mychart Sign-up: [email or web address]               Colin Benton R., DO

## 2016-07-27 ENCOUNTER — Ambulatory Visit (INDEPENDENT_AMBULATORY_CARE_PROVIDER_SITE_OTHER)
Admission: RE | Admit: 2016-07-27 | Discharge: 2016-07-27 | Disposition: A | Discharge status: Home / Self Care | Payer: 59 | Source: Ambulatory Visit | Attending: Family Medicine | Admitting: Family Medicine

## 2016-07-27 DIAGNOSIS — R05 Cough: Secondary | ICD-10-CM | POA: Diagnosis not present

## 2016-07-27 DIAGNOSIS — R059 Cough, unspecified: Secondary | ICD-10-CM

## 2016-07-27 LAB — HDL CHOLESTEROL: HDL: 78.5 mg/dL (ref >39.00)

## 2016-07-27 LAB — BASIC METABOLIC PANEL
BUN: 19 mg/dL (ref 6–23)
CALCIUM: 9.7 mg/dL (ref 8.4–10.5)
CO2: 29 meq/L (ref 19–32)
CREATININE: 0.7 mg/dL (ref 0.40–1.20)
Chloride: 104 mEq/L (ref 96–112)
GFR: 90.71 mL/min (ref >60.00)
Glucose, Bld: 94 mg/dL (ref 70–99)
Potassium: 4.1 mEq/L (ref 3.5–5.1)
SODIUM: 140 meq/L (ref 135–145)

## 2016-07-27 LAB — CHOLESTEROL, TOTAL: CHOLESTEROL: 262 mg/dL — AB (ref 0–200)

## 2016-07-27 LAB — CBC
HCT: 42.3 % (ref 36.0–46.0)
HEMOGLOBIN: 14 g/dL (ref 12.0–15.0)
MCHC: 33.1 g/dL (ref 30.0–36.0)
MCV: 91.1 fl (ref 78.0–100.0)
Platelets: 276 10*3/uL (ref 150.0–400.0)
RBC: 4.65 Mil/uL (ref 3.87–5.11)
RDW: 13.4 % (ref 11.5–15.5)
WBC: 6.7 10*3/uL (ref 4.0–10.5)

## 2016-07-30 MED ORDER — PRAVASTATIN SODIUM 40 MG PO TABS: 40.0000 mg | ORAL_TABLET | Freq: Every day | ORAL | 1 refills | Status: DC

## 2016-07-30 NOTE — Addendum Note (Signed)
Addended by: Agnes Lawrence on: 07/30/2016 11:51 AM   Modules accepted: Orders

## 2016-08-02 ENCOUNTER — Telehealth: Payer: Self-pay | Admitting: Family Medicine

## 2016-08-02 NOTE — Telephone Encounter (Signed)
I called the pt and informed her of the results and that Dr Maudie Mercury sent the results via a Mychart message on 3/11 and she stated she did not check her acct as she was having problems with it before and will call back for the number.

## 2016-08-02 NOTE — Telephone Encounter (Signed)
° ° °  Pt would like a call back about her chest xray she had last Friday

## 2016-08-06 MED FILL — VIT D2 1.25 MG (50,000 UNIT: 1.25 MG | 28 days supply | Qty: 4 | Fill #1

## 2016-08-22 DIAGNOSIS — L720 Epidermal cyst: Secondary | ICD-10-CM | POA: Diagnosis not present

## 2016-08-23 ENCOUNTER — Ambulatory Visit: Payer: 59 | Admitting: Family Medicine

## 2016-09-11 DIAGNOSIS — E559 Vitamin D deficiency, unspecified: Secondary | ICD-10-CM | POA: Diagnosis not present

## 2016-09-11 MED FILL — PRAVASTATIN NA 40 MG TAB: 40 | 90 days supply | Qty: 90 | Fill #0

## 2016-09-11 MED FILL — LISINOPRIL 10 MG TABLET: 10 | 90 days supply | Qty: 90 | Fill #1

## 2016-10-26 ENCOUNTER — Other Ambulatory Visit: Payer: Self-pay | Admitting: Obstetrics

## 2016-10-26 DIAGNOSIS — Z1231 Encounter for screening mammogram for malignant neoplasm of breast: Secondary | ICD-10-CM

## 2016-10-31 DIAGNOSIS — E559 Vitamin D deficiency, unspecified: Secondary | ICD-10-CM | POA: Diagnosis not present

## 2016-10-31 MED FILL — VIT D2 1.25 MG (50,000 UNIT: 1.25 MG | 28 days supply | Qty: 8 | Fill #0

## 2016-11-09 ENCOUNTER — Other Ambulatory Visit: Payer: Self-pay | Admitting: Obstetrics

## 2016-11-09 ENCOUNTER — Ambulatory Visit
Admission: RE | Admit: 2016-11-09 | Discharge: 2016-11-09 | Disposition: A | Discharge status: Home / Self Care | Payer: 59 | Source: Ambulatory Visit | Attending: Obstetrics | Admitting: Obstetrics

## 2016-11-09 DIAGNOSIS — Z1231 Encounter for screening mammogram for malignant neoplasm of breast: Secondary | ICD-10-CM

## 2016-11-09 DIAGNOSIS — M810 Age-related osteoporosis without current pathological fracture: Secondary | ICD-10-CM

## 2016-11-12 MED FILL — PROLIA 60 MG/ML SOLN: 60 | 30 days supply | Qty: 1 | Fill #0

## 2016-11-28 ENCOUNTER — Ambulatory Visit
Admission: RE | Admit: 2016-11-28 | Discharge: 2016-11-28 | Disposition: A | Discharge status: Home / Self Care | Payer: 59 | Source: Ambulatory Visit | Attending: Obstetrics | Admitting: Obstetrics

## 2016-11-28 DIAGNOSIS — Z780 Asymptomatic menopausal state: Secondary | ICD-10-CM | POA: Diagnosis not present

## 2016-11-28 DIAGNOSIS — M810 Age-related osteoporosis without current pathological fracture: Secondary | ICD-10-CM

## 2016-11-28 DIAGNOSIS — M8589 Other specified disorders of bone density and structure, multiple sites: Secondary | ICD-10-CM | POA: Diagnosis not present

## 2016-11-28 MED FILL — VIT D2 1.25 MG (50,000 UNIT: 1.25 MG | 28 days supply | Qty: 8 | Fill #1

## 2016-11-29 DIAGNOSIS — M810 Age-related osteoporosis without current pathological fracture: Secondary | ICD-10-CM | POA: Diagnosis not present

## 2016-12-24 MED FILL — VIT D2 1.25 MG (50,000 UNIT: 1.25 MG | 28 days supply | Qty: 8 | Fill #2

## 2017-01-01 MED FILL — LISINOPRIL 10 MG TABLET: 10 | 90 days supply | Qty: 90 | Fill #0

## 2017-01-01 MED FILL — PRAVASTATIN NA 40 MG TAB: 40 | 90 days supply | Qty: 90 | Fill #1

## 2017-01-02 DIAGNOSIS — E559 Vitamin D deficiency, unspecified: Secondary | ICD-10-CM | POA: Diagnosis not present

## 2017-01-15 DIAGNOSIS — Z01419 Encounter for gynecological examination (general) (routine) without abnormal findings: Secondary | ICD-10-CM | POA: Diagnosis not present

## 2017-01-15 DIAGNOSIS — Z6829 Body mass index (BMI) 29.0-29.9, adult: Secondary | ICD-10-CM | POA: Diagnosis not present

## 2017-01-22 MED FILL — VIT D2 1.25 MG (50,000 UNIT: 1.25 MG | 28 days supply | Qty: 8 | Fill #3

## 2017-02-07 ENCOUNTER — Encounter: Payer: Self-pay | Admitting: Family Medicine

## 2017-02-13 DIAGNOSIS — R140 Abdominal distension (gaseous): Secondary | ICD-10-CM | POA: Diagnosis not present

## 2017-02-25 MED FILL — VIT D2 1.25 MG (50,000 UNIT: 1.25 MG | 28 days supply | Qty: 8 | Fill #4

## 2017-03-26 MED FILL — VIT D2 1.25 MG (50,000 UNIT: 1.25 MG | 28 days supply | Qty: 8 | Fill #0

## 2017-04-09 ENCOUNTER — Other Ambulatory Visit: Payer: Self-pay | Admitting: Family Medicine

## 2017-04-09 MED FILL — LISINOPRIL 10 MG TABS: 10 | 90 days supply | Qty: 90 | Fill #1

## 2017-04-09 MED FILL — PRAVASTATIN NA 40 MG TAB: 40 | 90 days supply | Qty: 90 | Fill #0

## 2017-04-30 MED FILL — VIT D2 1.25 MG (50,000 UNIT: 1.25 MG | 28 days supply | Qty: 8 | Fill #1

## 2017-05-08 DIAGNOSIS — H5203 Hypermetropia, bilateral: Secondary | ICD-10-CM | POA: Diagnosis not present

## 2017-05-08 DIAGNOSIS — H35371 Puckering of macula, right eye: Secondary | ICD-10-CM | POA: Diagnosis not present

## 2017-05-08 DIAGNOSIS — H524 Presbyopia: Secondary | ICD-10-CM | POA: Diagnosis not present

## 2017-05-08 DIAGNOSIS — H2513 Age-related nuclear cataract, bilateral: Secondary | ICD-10-CM | POA: Diagnosis not present

## 2017-05-30 ENCOUNTER — Encounter: Payer: Self-pay | Admitting: Family Medicine

## 2017-06-01 NOTE — Progress Notes (Signed)
HPI:  Here for CPE:  -Concerns and/or follow up today:  Due for labs and flu shot  [name] is a pleasant 63 y.o. here for follow up. Chronic medical problems summarized below were reviewed for changes and stability and were updated as needed below. Has not been in for follow up in over 1 year.  Reports taking medications daily, but may not be doing so per refill patter. Reports doing well for the most part.  Gets 15,000 steps per day at work.  Reports diet is poor and she has decided to start working to improve this.  She has started this recently.  Reports she is now seeing an endocrinologist for the osteoporosis, she was referred by her gynecologist.  Denies CP, SOB, DOE, treatment intolerance or new symptoms.  HTN: -meds: lisinopril  HLD/Elevated BMI: -meds: pravastatin  IBD: -sees GI for managment  Osteoporosis: -sees endocrinologist now (referred by gyn) Dr. Chalmers Cater per her report   -on prolia, vit D  -Diabetes and Dyslipidemia Screening: due for labs -Vaccines: see vaccine section EPIC -pap history: sees gyn -FDLMP: see nursing notes -sexual activity: yes, female partner, no new partners -wants STI testing (Hep C if born 25-65): no -FH breast, colon or ovarian ca: see FH Last mammogram: sees gyn Last colon cancer screening: 2014 - due 03/2018  Breast Ca Risk Assessment: see family history and pt history DEXA (>/= 58): sees gyn for this  -Alcohol, Tobacco, drug use: see social history  Review of Systems - no fevers, unintentional weight loss, vision loss, hearing loss, chest pain, sob, hemoptysis, melena, hematochezia, hematuria, genital discharge, changing or concerning skin lesions, bleeding, bruising, loc, thoughts of self harm or SI  Past Medical History:  Diagnosis Date  . B12 DEFICIENCY 02/09/2009   Qualifier: Diagnosis of  By: Ronnald Ramp RN, CGRN, Sheri    . Blood in stool   . COLONIC POLYPS, HYPERPLASTIC, HX OF 02/03/2009   Qualifier: Diagnosis of  By:  Surface RN, Butch Penny    . DIVERTICULOSIS, COLON 02/03/2009   Qualifier: Diagnosis of  By: Varney Daily RN, Butch Penny    . Dyslipidemia 06/20/2012  . Fracture, intertrochanteric, left femur ([place]) 05/02/2012  . GERD (gastroesophageal reflux disease)   . Hearing loss    Bil/has hearing aids  . Hemorrhoids   . HEMORRHOIDS 02/03/2009   Qualifier: Diagnosis of  By: Varney Daily RN, Butch Penny    . Hypertension   . INFLAMMATORY BOWEL DISEASE - Followed by Dr. Sharlett Iles in GI 02/03/2009   Qualifier: Diagnosis of  By: Varney Daily RN, Butch Penny    . Osteopenia   . Turner's syndrome    was on Provera and Premarin and d/c this  at age 62yr . Ulcerative colitis     Past Surgical History:  Procedure Laterality Date  . COLONOSCOPY    . FRACTURE SURGERY  2004   fx lt thumb  . HERNIA REPAIR     lt ing   . INTRAMEDULLARY (IM) NAIL INTERTROCHANTERIC  05/02/2012   Procedure: INTRAMEDULLARY (IM) NAIL INTERTROCHANTRIC;  Surgeon: JJohnny Bridge MD;  Location: [facility]  Service: Orthopedics;  Laterality: Left;  . ORIF FINGER FRACTURE  06/14/2011   Procedure: OPEN REDUCTION INTERNAL FIXATION (ORIF) METACARPAL (FINGER) FRACTURE;  Surgeon: KTennis Must MD;  [facility]  Service: Orthopedics;  Laterality: Right;  right ring  . TONSILLECTOMY    . UPPER GASTROINTESTINAL ENDOSCOPY      Family History  Problem Relation Age of Onset  . Lymphoma Mother  nhl-mantle cell  . Lymphoma Father        nhl  . Colon cancer Neg Hx   . Esophageal cancer Neg Hx   . Stomach cancer Neg Hx   . Rectal cancer Neg Hx   . Breast cancer Neg Hx     Social History   Socioeconomic History  . Marital status: Married    Spouse name: None  . Number of children: None  . Years of education: None  . Highest education level: None  Social Needs  . Financial resource strain: None  . Food insecurity - worry: None  . Food insecurity - inability: None  . Transportation needs - medical: None  . Transportation needs -  non-medical: None  Occupational History  . None  Tobacco Use  . Smoking status: Never Smoker  . Smokeless tobacco: Never Used  Substance and Sexual Activity  . Alcohol use: Yes    Alcohol/week: 0.6 oz    Types: 1 Glasses of wine per week    Comment: rare  . Drug use: No  . Sexual activity: None  Other Topics Concern  . None  Social History Narrative   Works at 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  denosumab (PROLIA) 60 MG/ML SOLN injection, Inject 60 mg into the skin every 6 (six) months. Administer in upper arm, thigh, or abdomen, Disp: , Rfl:  .  lisinopril (PRINIVIL,ZESTRIL) 10 MG tablet, Take 1 tablet (10 mg total) by mouth daily., Disp: 90 tablet, Rfl: 3 .  Multiple Vitamin (MULTIVITAMIN) tablet, Take 1 tablet by mouth daily., Disp: , Rfl:  .  pravastatin (PRAVACHOL) 40 MG tablet, TAKE 1 TABLET (40 MG TOTAL) BY MOUTH DAILY., Disp: 90 tablet, Rfl: 0 .  Vitamin D, Ergocalciferol, (DRISDOL) 50000 UNITS CAPS, Take 1 capsule (50,000 Units total) by mouth every 7 (seven) days., Disp: 30 capsule, Rfl: 1  EXAM:  Vitals:   06/03/17 0942  BP: 122/64  Pulse: 74  Temp: 98.3 F (36.8 C)  Body mass index is 31.5 kg/m.   GENERAL: vitals reviewed and listed below, alert, oriented, appears well hydrated and in no acute distress  HEENT: head atraumatic, PERRLA, normal appearance of eyes, ears, nose and mouth. moist mucus membranes.  NECK: supple, no masses or lymphadenopathy  LUNGS: clear to auscultation bilaterally, no rales, rhonchi or wheeze  CV: HRRR, no peripheral edema or cyanosis, normal pedal pulses  ABDOMEN: bowel sounds normal, soft, non tender to palpation, no masses, no rebound or guarding  GU/BREAST: Declined, does with GYN  SKIN: no rash or abnormal lesions  MS: normal gait, moves all extremities normally  NEURO: normal  gait, speech and thought processing grossly intact, muscle tone grossly intact throughout  PSYCH: normal affect, pleasant and cooperative  ASSESSMENT AND PLAN:  Discussed the following assessment and plan:  PREVENTIVE EXAM: -Discussed and advised all [place] preventive services health task force level A and B recommendations for age, sex and risks. -Advised at least 150 minutes of exercise per week and a healthy diet with avoidance of (less then 1 serving per week) processed foods, white starches, red meat, fast foods and sweets and consisting of: * 5-9 servings of fresh fruits and vegetables (not corn or potatoes) *nuts and seeds, beans *olives and olive oil *lean meats such as  fish and white chicken  *whole grains -labs, studies and vaccines per orders this encounter  2. Screening for depression -neg   3. Essential hypertension - Basic metabolic panel - CBC -continue lisinopril -lifestyle recs -advise to take med daily and follow up in 4 months  4. Dyslipidemia -lifestyle recs, labs, continue statin - see below - Lipid panel  5. BMI 31.0-31.9,adult -healthy low sugar diet, regular activity/exercise advise for wt reduction with follow up in 4 months - Hemoglobin A1c  6. Encounter for hepatitis C virus screening test for high risk patient - Hepatitis C antibody   Patient advised to return to clinic immediately if symptoms worsen or persist or new concerns.  Patient Instructions  BEFORE YOU LEAVE: -labs -follow up: 4 months  Schedule your colonoscopy this year  We have ordered labs or studies at this visit. It can take up to 1-2 weeks for results and processing. IF results require follow up or explanation, we will call you with instructions. Clinically stable results will be released to your [facility]. If you have not heard from [place] or cannot find your results in [facility] in 2 weeks please contact our office at [phone].  If you are not yet signed up for [facility], please  consider signing up.   We recommend the following healthy lifestyle for LIFE: 1) Small portions. But, make sure to get regular (at least 3 per day), healthy meals and small healthy snacks if needed.  2) Eat a healthy clean diet.   TRY TO EAT: -at least 5-7 servings of low sugar, colorful, and nutrient rich vegetables per day (not corn, potatoes or bananas.) -berries are the best choice if you wish to eat fruit (only eat small amounts if trying to reduce weight)  -lean meets (fish, white meat of chicken or [place]) -vegan proteins for some meals - beans or tofu, whole grains, nuts and seeds -Replace bad fats with good fats - good fats include: fish, nuts and seeds, canola oil, olive oil -small amounts of low fat or non fat dairy -small amounts of100 % whole grains - check the lables -drink plenty of water  AVOID: -SUGAR, sweets, anything with added sugar, corn syrup or sweeteners - must read labels as even foods advertised as "healthy" often are loaded with sugar -if you must have a sweetener, small amounts of stevia may be best -sweetened beverages and artificially sweetened beverages -simple starches (rice, bread, potatoes, pasta, chips, etc - small amounts of 100% whole grains are ok) -red meat, pork, butter -fried foods, fast food, processed food, excessive dairy, eggs and coconut.  3)Get at least 150 minutes of sweaty aerobic exercise per week.  4)Reduce stress - consider counseling, meditation and relaxation to balance other aspects of your life.   Health Maintenance for Postmenopausal Women Menopause is a normal process in which your reproductive ability comes to an end. This process happens gradually over a span of months to years, usually between the ages of 59 and 72. Menopause is complete when you have missed 12 consecutive menstrual periods. It is important to talk with your health care provider about some of the most common conditions that affect postmenopausal women, such  as heart disease, cancer, and bone loss (osteoporosis). Adopting a healthy lifestyle and getting preventive care can help to promote your health and wellness. Those actions can also lower your chances of developing some of these common conditions. What should I know about menopause? During menopause, you may experience a number of symptoms, such as:  Moderate-to-severe hot flashes.  Night sweats.  Decrease in sex drive.  Mood swings.  Headaches.  Tiredness.  Irritability.  Memory problems.  Insomnia.  Choosing to treat or not to treat menopausal changes is an individual decision that you make with your health care provider. What should I know about hormone replacement therapy and supplements? Hormone therapy products are effective for treating symptoms that are associated with menopause, such as hot flashes and night sweats. Hormone replacement carries certain risks, especially as you become older. If you are thinking about using estrogen or estrogen with progestin treatments, discuss the benefits and risks with your health care provider. What should I know about heart disease and stroke? Heart disease, heart attack, and stroke become more likely as you age. This may be due, in part, to the hormonal changes that your body experiences during menopause. These can affect how your body processes dietary fats, triglycerides, and cholesterol. Heart attack and stroke are both medical emergencies. There are many things that you can do to help prevent heart disease and stroke:  Have your blood pressure checked at least every 1-2 years. High blood pressure causes heart disease and increases the risk of stroke.  If you are 50-41 years old, ask your health care provider if you should take aspirin to prevent a heart attack or a stroke.  Do not use any tobacco products, including cigarettes, chewing tobacco, or electronic cigarettes. If you need help quitting, ask your health care provider.  It  is important to eat a healthy diet and maintain a healthy weight. ? Be sure to include plenty of vegetables, fruits, low-fat dairy products, and lean protein. ? Avoid eating foods that are high in solid fats, added sugars, or salt (sodium).  Get regular exercise. This is one of the most important things that you can do for your health. ? Try to exercise for at least 150 minutes each week. The type of exercise that you do should increase your heart rate and make you sweat. This is known as moderate-intensity exercise. ? Try to do strengthening exercises at least twice each week. Do these in addition to the moderate-intensity exercise.  Know your numbers.Ask your health care provider to check your cholesterol and your blood glucose. Continue to have your blood tested as directed by your health care provider.  What should I know about cancer screening? There are several types of cancer. Take the following steps to reduce your risk and to catch any cancer development as early as possible. Breast Cancer  Practice breast self-awareness. ? This means understanding how your breasts normally appear and feel. ? It also means doing regular breast self-exams. Let your health care provider know about any changes, no matter how small.  If you are 26 or older, have a clinician do a breast exam (clinical breast exam or CBE) every year. Depending on your age, family history, and medical history, it may be recommended that you also have a yearly breast X-ray (mammogram).  If you have a family history of breast cancer, talk with your health care provider about genetic screening.  If you are at high risk for breast cancer, talk with your health care provider about having an MRI and a mammogram every year.  Breast cancer (BRCA) gene test is recommended for women who have family members with BRCA-related cancers. Results of the assessment will determine the need for genetic counseling and BRCA1 and for BRCA2  testing. BRCA-related cancers include these types: ? Breast. This occurs in  males or females. ? Ovarian. ? Tubal. This may also be called fallopian tube cancer. ? Cancer of the abdominal or pelvic lining (peritoneal cancer). ? Prostate. ? Pancreatic.  Cervical, Uterine, and Ovarian Cancer Your health care provider may recommend that you be screened regularly for cancer of the pelvic organs. These include your ovaries, uterus, and vagina. This screening involves a pelvic exam, which includes checking for microscopic changes to the surface of your cervix (Pap test).  For women ages 21-65, health care providers may recommend a pelvic exam and a Pap test every three years. For women ages 16-65, they may recommend the Pap test and pelvic exam, combined with testing for human papilloma virus (HPV), every five years. Some types of HPV increase your risk of cervical cancer. Testing for HPV may also be done on women of any age who have unclear Pap test results.  Other health care providers may not recommend any screening for nonpregnant women who are considered low risk for pelvic cancer and have no symptoms. Ask your health care provider if a screening pelvic exam is right for you.  If you have had past treatment for cervical cancer or a condition that could lead to cancer, you need Pap tests and screening for cancer for at least 20 years after your treatment. If Pap tests have been discontinued for you, your risk factors (such as having a new sexual partner) need to be reassessed to determine if you should start having screenings again. Some women have medical problems that increase the chance of getting cervical cancer. In these cases, your health care provider may recommend that you have screening and Pap tests more often.  If you have a family history of uterine cancer or ovarian cancer, talk with your health care provider about genetic screening.  If you have vaginal bleeding after reaching  menopause, tell your health care provider.  There are currently no reliable tests available to screen for ovarian cancer.  Lung Cancer Lung cancer screening is recommended for adults 76-79 years old who are at high risk for lung cancer because of a history of smoking. A yearly low-dose CT scan of the lungs is recommended if you:  Currently smoke.  Have a history of at least 30 pack-years of smoking and you currently smoke or have quit within the past 15 years. A pack-year is smoking an average of one pack of cigarettes per day for one year.  Yearly screening should:  Continue until it has been 15 years since you quit.  Stop if you develop a health problem that would prevent you from having lung cancer treatment.  Colorectal Cancer  This type of cancer can be detected and can often be prevented.  Routine colorectal cancer screening usually begins at age 72 and continues through age 72.  If you have risk factors for colon cancer, your health care provider may recommend that you be screened at an earlier age.  If you have a family history of colorectal cancer, talk with your health care provider about genetic screening.  Your health care provider may also recommend using home test kits to check for hidden blood in your stool.  A small camera at the end of a tube can be used to examine your colon directly (sigmoidoscopy or colonoscopy). This is done to check for the earliest forms of colorectal cancer.  Direct examination of the colon should be repeated every 5-10 years until age 3. However, if early forms of precancerous polyps or small growths  are found or if you have a family history or genetic risk for colorectal cancer, you may need to be screened more often.  Skin Cancer  Check your skin from head to toe regularly.  Monitor any moles. Be sure to tell your health care provider: ? About any new moles or changes in moles, especially if there is a change in a mole's shape or  color. ? If you have a mole that is larger than the size of a pencil eraser.  If any of your family members has a history of skin cancer, especially at a young age, talk with your health care provider about genetic screening.  Always use sunscreen. Apply sunscreen liberally and repeatedly throughout the day.  Whenever you are outside, protect yourself by wearing long sleeves, pants, a wide-brimmed hat, and sunglasses.  What should I know about osteoporosis? Osteoporosis is a condition in which bone destruction happens more quickly than new bone creation. After menopause, you may be at an increased risk for osteoporosis. To help prevent osteoporosis or the bone fractures that can happen because of osteoporosis, the following is recommended:  If you are 28-47 years old, get at least 1,000 mg of calcium and at least 600 mg of vitamin D per day.  If you are older than age 59 but younger than age 36, get at least 1,200 mg of calcium and at least 600 mg of vitamin D per day.  If you are older than age 57, get at least 1,200 mg of calcium and at least 800 mg of vitamin D per day.  Smoking and excessive alcohol intake increase the risk of osteoporosis. Eat foods that are rich in calcium and vitamin D, and do weight-bearing exercises several times each week as directed by your health care provider. What should I know about how menopause affects my mental health? Depression may occur at any age, but it is more common as you become older. Common symptoms of depression include:  Low or sad mood.  Changes in sleep patterns.  Changes in appetite or eating patterns.  Feeling an overall lack of motivation or enjoyment of activities that you previously enjoyed.  Frequent crying spells.  Talk with your health care provider if you think that you are experiencing depression. What should I know about immunizations? It is important that you get and maintain your immunizations. These include:  Tetanus,  diphtheria, and pertussis (Tdap) booster vaccine.  Influenza every year before the flu season begins.  Pneumonia vaccine.  Shingles vaccine.  Your health care provider may also recommend other immunizations. This information is not intended to replace advice given to you by your health care provider. Make sure you discuss any questions you have with your health care provider. Document Released: 06/29/2005 Document Revised: 11/25/2015 Document Reviewed: 02/08/2015 Elsevier Interactive Patient Education  2018 [employer].         No Follow-up on file.  Colin Benton R., DO

## 2017-06-03 ENCOUNTER — Ambulatory Visit (INDEPENDENT_AMBULATORY_CARE_PROVIDER_SITE_OTHER): Payer: 59 | Admitting: Family Medicine

## 2017-06-03 ENCOUNTER — Encounter: Payer: Self-pay | Admitting: Family Medicine

## 2017-06-03 VITALS — BP 122/64 | HR 74 | Temp 98.3°F | Ht <= 58 in | Wt 143.0 lb

## 2017-06-03 DIAGNOSIS — Z6831 Body mass index (BMI) 31.0-31.9, adult: Secondary | ICD-10-CM | POA: Diagnosis not present

## 2017-06-03 DIAGNOSIS — I10 Essential (primary) hypertension: Secondary | ICD-10-CM

## 2017-06-03 DIAGNOSIS — Z0000 Encounter for general adult medical examination without abnormal findings: Secondary | ICD-10-CM | POA: Diagnosis not present

## 2017-06-03 DIAGNOSIS — Z1159 Encounter for screening for other viral diseases: Secondary | ICD-10-CM

## 2017-06-03 DIAGNOSIS — Z1331 Encounter for screening for depression: Secondary | ICD-10-CM

## 2017-06-03 DIAGNOSIS — Z9189 Other specified personal risk factors, not elsewhere classified: Secondary | ICD-10-CM

## 2017-06-03 DIAGNOSIS — E785 Hyperlipidemia, unspecified: Secondary | ICD-10-CM

## 2017-06-03 LAB — CBC
HCT: 41.3 % (ref 36.0–46.0)
Hemoglobin: 13.6 g/dL (ref 12.0–15.0)
MCHC: 32.9 g/dL (ref 30.0–36.0)
MCV: 94.1 fl (ref 78.0–100.0)
PLATELETS: 215 10*3/uL (ref 150.0–400.0)
RBC: 4.39 Mil/uL (ref 3.87–5.11)
RDW: 13.1 % (ref 11.5–15.5)
WBC: 4.7 10*3/uL (ref 4.0–10.5)

## 2017-06-03 LAB — BASIC METABOLIC PANEL
BUN: 13 mg/dL (ref 6–23)
CHLORIDE: 105 meq/L (ref 96–112)
CO2: 29 mEq/L (ref 19–32)
CREATININE: 0.6 mg/dL (ref 0.40–1.20)
Calcium: 9.1 mg/dL (ref 8.4–10.5)
GFR: 108.06 mL/min (ref >60.00)
Glucose, Bld: 93 mg/dL (ref 70–99)
Potassium: 4 mEq/L (ref 3.5–5.1)
Sodium: 141 mEq/L (ref 135–145)

## 2017-06-03 LAB — LIPID PANEL
Cholesterol: 233 mg/dL — ABNORMAL HIGH (ref 0–200)
HDL: 75.6 mg/dL (ref >39.00)
LDL CALC: 142 mg/dL — AB (ref 0–99)
NONHDL: 157.04
Total CHOL/HDL Ratio: 3
Triglycerides: 75 mg/dL (ref 0.0–149.0)
VLDL: 15 mg/dL (ref 0.0–40.0)

## 2017-06-03 LAB — HEMOGLOBIN A1C: HEMOGLOBIN A1C: 5.4 % (ref 4.6–6.5)

## 2017-06-03 MED ORDER — LISINOPRIL 10 MG PO TABS: 10.0000 mg | ORAL_TABLET | Freq: Every day | ORAL | 3 refills | Status: DC

## 2017-06-03 MED ORDER — PRAVASTATIN SODIUM 40 MG PO TABS: 40.0000 mg | ORAL_TABLET | Freq: Every day | ORAL | 1 refills | Status: DC

## 2017-06-03 NOTE — Patient Instructions (Signed)
BEFORE YOU LEAVE: -labs -follow up: 4 months  Schedule your colonoscopy this year  We have ordered labs or studies at this visit. It can take up to 1-2 weeks for results and processing. IF results require follow up or explanation, we will call you with instructions. Clinically stable results will be released to your [facility]. If you have not heard from [place] or cannot find your results in [facility] in 2 weeks please contact our office at [phone].  If you are not yet signed up for [facility], please consider signing up.   We recommend the following healthy lifestyle for LIFE: 1) Small portions. But, make sure to get regular (at least 3 per day), healthy meals and small healthy snacks if needed.  2) Eat a healthy clean diet.   TRY TO EAT: -at least 5-7 servings of low sugar, colorful, and nutrient rich vegetables per day (not corn, potatoes or bananas.) -berries are the best choice if you wish to eat fruit (only eat small amounts if trying to reduce weight)  -lean meets (fish, white meat of chicken or [place]) -vegan proteins for some meals - beans or tofu, whole grains, nuts and seeds -Replace bad fats with good fats - good fats include: fish, nuts and seeds, canola oil, olive oil -small amounts of low fat or non fat dairy -small amounts of100 % whole grains - check the lables -drink plenty of water  AVOID: -SUGAR, sweets, anything with added sugar, corn syrup or sweeteners - must read labels as even foods advertised as "healthy" often are loaded with sugar -if you must have a sweetener, small amounts of stevia may be best -sweetened beverages and artificially sweetened beverages -simple starches (rice, bread, potatoes, pasta, chips, etc - small amounts of 100% whole grains are ok) -red meat, pork, butter -fried foods, fast food, processed food, excessive dairy, eggs and coconut.  3)Get at least 150 minutes of sweaty aerobic exercise per week.  4)Reduce stress - consider counseling,  meditation and relaxation to balance other aspects of your life.   Health Maintenance for Postmenopausal Women Menopause is a normal process in which your reproductive ability comes to an end. This process happens gradually over a span of months to years, usually between the ages of 77 and 41. Menopause is complete when you have missed 12 consecutive menstrual periods. It is important to talk with your health care provider about some of the most common conditions that affect postmenopausal women, such as heart disease, cancer, and bone loss (osteoporosis). Adopting a healthy lifestyle and getting preventive care can help to promote your health and wellness. Those actions can also lower your chances of developing some of these common conditions. What should I know about menopause? During menopause, you may experience a number of symptoms, such as:  Moderate-to-severe hot flashes.  Night sweats.  Decrease in sex drive.  Mood swings.  Headaches.  Tiredness.  Irritability.  Memory problems.  Insomnia.  Choosing to treat or not to treat menopausal changes is an individual decision that you make with your health care provider. What should I know about hormone replacement therapy and supplements? Hormone therapy products are effective for treating symptoms that are associated with menopause, such as hot flashes and night sweats. Hormone replacement carries certain risks, especially as you become older. If you are thinking about using estrogen or estrogen with progestin treatments, discuss the benefits and risks with your health care provider. What should I know about heart disease and stroke? Heart disease, heart attack,  and stroke become more likely as you age. This may be due, in part, to the hormonal changes that your body experiences during menopause. These can affect how your body processes dietary fats, triglycerides, and cholesterol. Heart attack and stroke are both medical  emergencies. There are many things that you can do to help prevent heart disease and stroke:  Have your blood pressure checked at least every 1-2 years. High blood pressure causes heart disease and increases the risk of stroke.  If you are 50-72 years old, ask your health care provider if you should take aspirin to prevent a heart attack or a stroke.  Do not use any tobacco products, including cigarettes, chewing tobacco, or electronic cigarettes. If you need help quitting, ask your health care provider.  It is important to eat a healthy diet and maintain a healthy weight. ? Be sure to include plenty of vegetables, fruits, low-fat dairy products, and lean protein. ? Avoid eating foods that are high in solid fats, added sugars, or salt (sodium).  Get regular exercise. This is one of the most important things that you can do for your health. ? Try to exercise for at least 150 minutes each week. The type of exercise that you do should increase your heart rate and make you sweat. This is known as moderate-intensity exercise. ? Try to do strengthening exercises at least twice each week. Do these in addition to the moderate-intensity exercise.  Know your numbers.Ask your health care provider to check your cholesterol and your blood glucose. Continue to have your blood tested as directed by your health care provider.  What should I know about cancer screening? There are several types of cancer. Take the following steps to reduce your risk and to catch any cancer development as early as possible. Breast Cancer  Practice breast self-awareness. ? This means understanding how your breasts normally appear and feel. ? It also means doing regular breast self-exams. Let your health care provider know about any changes, no matter how small.  If you are 29 or older, have a clinician do a breast exam (clinical breast exam or CBE) every year. Depending on your age, family history, and medical history, it  may be recommended that you also have a yearly breast X-ray (mammogram).  If you have a family history of breast cancer, talk with your health care provider about genetic screening.  If you are at high risk for breast cancer, talk with your health care provider about having an MRI and a mammogram every year.  Breast cancer (BRCA) gene test is recommended for women who have family members with BRCA-related cancers. Results of the assessment will determine the need for genetic counseling and BRCA1 and for BRCA2 testing. BRCA-related cancers include these types: ? Breast. This occurs in males or females. ? Ovarian. ? Tubal. This may also be called fallopian tube cancer. ? Cancer of the abdominal or pelvic lining (peritoneal cancer). ? Prostate. ? Pancreatic.  Cervical, Uterine, and Ovarian Cancer Your health care provider may recommend that you be screened regularly for cancer of the pelvic organs. These include your ovaries, uterus, and vagina. This screening involves a pelvic exam, which includes checking for microscopic changes to the surface of your cervix (Pap test).  For women ages 21-65, health care providers may recommend a pelvic exam and a Pap test every three years. For women ages 67-65, they may recommend the Pap test and pelvic exam, combined with testing for human papilloma virus (HPV), every five  years. Some types of HPV increase your risk of cervical cancer. Testing for HPV may also be done on women of any age who have unclear Pap test results.  Other health care providers may not recommend any screening for nonpregnant women who are considered low risk for pelvic cancer and have no symptoms. Ask your health care provider if a screening pelvic exam is right for you.  If you have had past treatment for cervical cancer or a condition that could lead to cancer, you need Pap tests and screening for cancer for at least 20 years after your treatment. If Pap tests have been discontinued  for you, your risk factors (such as having a new sexual partner) need to be reassessed to determine if you should start having screenings again. Some women have medical problems that increase the chance of getting cervical cancer. In these cases, your health care provider may recommend that you have screening and Pap tests more often.  If you have a family history of uterine cancer or ovarian cancer, talk with your health care provider about genetic screening.  If you have vaginal bleeding after reaching menopause, tell your health care provider.  There are currently no reliable tests available to screen for ovarian cancer.  Lung Cancer Lung cancer screening is recommended for adults 52-65 years old who are at high risk for lung cancer because of a history of smoking. A yearly low-dose CT scan of the lungs is recommended if you:  Currently smoke.  Have a history of at least 30 pack-years of smoking and you currently smoke or have quit within the past 15 years. A pack-year is smoking an average of one pack of cigarettes per day for one year.  Yearly screening should:  Continue until it has been 15 years since you quit.  Stop if you develop a health problem that would prevent you from having lung cancer treatment.  Colorectal Cancer  This type of cancer can be detected and can often be prevented.  Routine colorectal cancer screening usually begins at age 55 and continues through age 47.  If you have risk factors for colon cancer, your health care provider may recommend that you be screened at an earlier age.  If you have a family history of colorectal cancer, talk with your health care provider about genetic screening.  Your health care provider may also recommend using home test kits to check for hidden blood in your stool.  A small camera at the end of a tube can be used to examine your colon directly (sigmoidoscopy or colonoscopy). This is done to check for the earliest forms of  colorectal cancer.  Direct examination of the colon should be repeated every 5-10 years until age 23. However, if early forms of precancerous polyps or small growths are found or if you have a family history or genetic risk for colorectal cancer, you may need to be screened more often.  Skin Cancer  Check your skin from head to toe regularly.  Monitor any moles. Be sure to tell your health care provider: ? About any new moles or changes in moles, especially if there is a change in a mole's shape or color. ? If you have a mole that is larger than the size of a pencil eraser.  If any of your family members has a history of skin cancer, especially at a young age, talk with your health care provider about genetic screening.  Always use sunscreen. Apply sunscreen liberally and repeatedly throughout  the day.  Whenever you are outside, protect yourself by wearing long sleeves, pants, a wide-brimmed hat, and sunglasses.  What should I know about osteoporosis? Osteoporosis is a condition in which bone destruction happens more quickly than new bone creation. After menopause, you may be at an increased risk for osteoporosis. To help prevent osteoporosis or the bone fractures that can happen because of osteoporosis, the following is recommended:  If you are 26-36 years old, get at least 1,000 mg of calcium and at least 600 mg of vitamin D per day.  If you are older than age 58 but younger than age 25, get at least 1,200 mg of calcium and at least 600 mg of vitamin D per day.  If you are older than age 78, get at least 1,200 mg of calcium and at least 800 mg of vitamin D per day.  Smoking and excessive alcohol intake increase the risk of osteoporosis. Eat foods that are rich in calcium and vitamin D, and do weight-bearing exercises several times each week as directed by your health care provider. What should I know about how menopause affects my mental health? Depression may occur at any age, but it  is more common as you become older. Common symptoms of depression include:  Low or sad mood.  Changes in sleep patterns.  Changes in appetite or eating patterns.  Feeling an overall lack of motivation or enjoyment of activities that you previously enjoyed.  Frequent crying spells.  Talk with your health care provider if you think that you are experiencing depression. What should I know about immunizations? It is important that you get and maintain your immunizations. These include:  Tetanus, diphtheria, and pertussis (Tdap) booster vaccine.  Influenza every year before the flu season begins.  Pneumonia vaccine.  Shingles vaccine.  Your health care provider may also recommend other immunizations. This information is not intended to replace advice given to you by your health care provider. Make sure you discuss any questions you have with your health care provider. Document Released: 06/29/2005 Document Revised: 11/25/2015 Document Reviewed: 02/08/2015 Elsevier Interactive Patient Education  2018 [employer].

## 2017-06-03 NOTE — Addendum Note (Signed)
Addended by: Agnes Lawrence on: 06/03/2017 10:35 AM   Modules accepted: Orders

## 2017-06-04 LAB — HEPATITIS C ANTIBODY
Hepatitis C Ab: NONREACTIVE
SIGNAL TO CUT-OFF: 0.01 (ref <1.00)

## 2017-06-04 MED FILL — VIT D2 1.25 MG (50,000 UNIT: 1.25 MG | 28 days supply | Qty: 8 | Fill #2

## 2017-06-10 MED ORDER — ROSUVASTATIN CALCIUM 20 MG PO TABS: 20.0000 mg | ORAL_TABLET | Freq: Every day | ORAL | 3 refills | Status: DC

## 2017-06-10 MED FILL — ROSUVASTATIN CALCIUM 20 MG: 20 | 90 days supply | Qty: 90 | Fill #0

## 2017-06-10 NOTE — Addendum Note (Signed)
Addended by: Agnes Lawrence on: 06/10/2017 02:49 PM   Modules accepted: Orders

## 2017-06-12 MED FILL — PROLIA 60 MG/ML SOLN: 60 | 30 days supply | Qty: 1 | Fill #0

## 2017-06-14 DIAGNOSIS — M810 Age-related osteoporosis without current pathological fracture: Secondary | ICD-10-CM | POA: Diagnosis not present

## 2017-06-28 DIAGNOSIS — R32 Unspecified urinary incontinence: Secondary | ICD-10-CM | POA: Diagnosis not present

## 2017-06-28 DIAGNOSIS — N3941 Urge incontinence: Secondary | ICD-10-CM | POA: Diagnosis not present

## 2017-07-09 MED FILL — VIT D2 1.25 MG (50,000 UNIT: 1.25 MG | 28 days supply | Qty: 8 | Fill #3

## 2017-07-10 MED FILL — OXYBUTYNIN CL ER 15 MG TAB: 15 | 30 days supply | Qty: 30 | Fill #0

## 2017-08-12 MED FILL — LISINOPRIL 10 MG TABS: 10 | 90 days supply | Qty: 90 | Fill #0

## 2017-08-12 MED FILL — VIT D2 1.25 MG (50,000 UNIT: 1.25 MG | 28 days supply | Qty: 8 | Fill #4

## 2017-08-19 MED FILL — OXYBUTYNIN CL ER 15 MG TAB: 15 | 30 days supply | Qty: 30 | Fill #1

## 2017-09-10 MED FILL — VIT D2 1.25 MG (50,000 UNIT: 1.25 MG | 28 days supply | Qty: 8 | Fill #0

## 2017-09-30 ENCOUNTER — Ambulatory Visit: Payer: Self-pay | Admitting: Family Medicine

## 2017-09-30 MED FILL — ROSUVASTATIN CALCIUM 20 MG: 20 | 90 days supply | Qty: 90 | Fill #1

## 2017-09-30 MED FILL — OXYBUTYNIN CL ER 15 MG TAB: 15 | 30 days supply | Qty: 30 | Fill #2

## 2017-10-06 NOTE — Progress Notes (Signed)
HPI:  Using dictation device. Unfortunately this device frequently misinterprets words/phrases.  [name] is a pleasant 62 y.o. here for follow up. Chronic medical problems summarized below were reviewed for changes. Changed from pravastatin to crestor 05/2017. Reports had to loose wt or loose job as works for [employer] - down significantly from last visit. Getting 20000 steps daily at work. Denies CP, SOB, DOE, treatment intolerance or new symptoms. Due to recheck labs including lipids. CPE 06/03/17  HTN: -meds: lisinopril  HLD/Elevated BMI: -meds: pravastatin --> crestor 05/2017 -wt 143 --> 128 (5/19)  IBD: -sees GI for managment  Osteoporosis: -sees endocrinologist now (referred by gyn) Dr. Chalmers Cater per her report                       -on prolia, vit D   ROS: See pertinent positives and negatives per HPI.  Past Medical History:  Diagnosis Date  . B12 DEFICIENCY 02/09/2009   Qualifier: Diagnosis of  By: Ronnald Ramp RN, CGRN, Sheri    . Blood in stool   . COLONIC POLYPS, HYPERPLASTIC, HX OF 02/03/2009   Qualifier: Diagnosis of  By: Surface RN, Butch Penny    . DIVERTICULOSIS, COLON 02/03/2009   Qualifier: Diagnosis of  By: Varney Daily RN, Butch Penny    . Dyslipidemia 06/20/2012  . Fracture, intertrochanteric, left femur ([place]) 05/02/2012  . GERD (gastroesophageal reflux disease)   . Hearing loss    Bil/has hearing aids  . Hemorrhoids   . HEMORRHOIDS 02/03/2009   Qualifier: Diagnosis of  By: Varney Daily RN, Butch Penny    . Hypertension   . INFLAMMATORY BOWEL DISEASE - Followed by Dr. Sharlett Iles in GI 02/03/2009   Qualifier: Diagnosis of  By: Varney Daily RN, Butch Penny    . Osteopenia   . Turner's syndrome    was on Provera and Premarin and d/c this  at age 7yr  . Ulcerative colitis     Past Surgical History:  Procedure Laterality Date  . COLONOSCOPY    . FRACTURE SURGERY  2004   fx lt thumb  . HERNIA REPAIR     lt ing   . INTRAMEDULLARY (IM) NAIL INTERTROCHANTERIC  05/02/2012   Procedure:  INTRAMEDULLARY (IM) NAIL INTERTROCHANTRIC;  Surgeon: Johnny Bridge, MD;  Location: [place];  Service: Orthopedics;  Laterality: Left;  . ORIF FINGER FRACTURE  06/14/2011   Procedure: OPEN REDUCTION INTERNAL FIXATION (ORIF) METACARPAL (FINGER) FRACTURE;  Surgeon: Tennis Must, MD;  Location: [place];  Service: Orthopedics;  Laterality: Right;  right ring  . TONSILLECTOMY    . UPPER GASTROINTESTINAL ENDOSCOPY      Family History  Problem Relation Age of Onset  . Lymphoma Mother        nhl-mantle cell  . Lymphoma Father        nhl  . Colon cancer Neg Hx   . Esophageal cancer Neg Hx   . Stomach cancer Neg Hx   . Rectal cancer Neg Hx   . Breast cancer Neg Hx     SOCIAL HX: see hpi   Current Outpatient Medications:  .  denosumab (PROLIA) 60 MG/ML SOLN injection, Inject 60 mg into the skin every 6 (six) months. Administer in upper arm, thigh, or abdomen, Disp: , Rfl:  .  lisinopril (PRINIVIL,ZESTRIL) 10 MG tablet, Take 1 tablet (10 mg total) by mouth daily., Disp: 90 tablet, Rfl: 3 .  Multiple Vitamin (MULTIVITAMIN) tablet, Take 1 tablet by mouth daily., Disp: , Rfl:  .  rosuvastatin (  CRESTOR) 20 MG tablet, Take 1 tablet (20 mg total) by mouth daily., Disp: 90 tablet, Rfl: 3 .  Vitamin D, Ergocalciferol, (DRISDOL) 50000 UNITS CAPS, Take 1 capsule (50,000 Units total) by mouth every 7 (seven) days., Disp: 30 capsule, Rfl: 1  EXAM:  Vitals:   10/07/17 0938  BP: 100/60  Pulse: (!) 55  Temp: 98.7 F (37.1 C)    Body mass index is 28.32 kg/m.  GENERAL: vitals reviewed and listed above, alert, oriented, appears well hydrated and in no acute distress  HEENT: atraumatic, conjunttiva clear, no obvious abnormalities on inspection of external nose and ears  NECK: no obvious masses on inspection  LUNGS: clear to auscultation bilaterally, no wheezes, rales or rhonchi, good air movement  CV: HRRR, no peripheral edema  MS: moves all extremities without noticeable  abnormality  PSYCH: pleasant and cooperative, no obvious depression or anxiety  ASSESSMENT AND PLAN:  Discussed the following assessment and plan:  Essential hypertension - Plan: Basic metabolic panel -looks good today, labs, may need to reduce BP medication if maintains lower wt  Dyslipidemia - Plan: Lipid panel -labs today  BMI 28.0-28.9,adult (overweight) -congratulated on wt reduction from 143--> 128 since last visit! -encouraged to continue healthy diet and regular exercise   -Patient advised to return or notify a doctor immediately if symptoms worsen or persist or new concerns arise.  Patient Instructions  BEFORE YOU LEAVE: -labs -follow up: 3-5 months  Congratulations on the weight reduction!   We have ordered labs or studies at this visit. It can take up to 1-2 weeks for results and processing. IF results require follow up or explanation, we will call you with instructions. Clinically stable results will be released to your [facility] If you have not heard from [place] or cannot find your results in [facility] in 2 weeks please contact our office at [phone].  If you are not yet signed up for [facility], please consider signing up.           Lucretia Kern, DO

## 2017-10-07 ENCOUNTER — Ambulatory Visit: Payer: 59 | Admitting: Family Medicine

## 2017-10-07 ENCOUNTER — Encounter: Payer: Self-pay | Admitting: Family Medicine

## 2017-10-07 VITALS — BP 100/60 | HR 55 | Temp 98.7°F | Ht <= 58 in | Wt 128.6 lb

## 2017-10-07 DIAGNOSIS — E785 Hyperlipidemia, unspecified: Secondary | ICD-10-CM

## 2017-10-07 DIAGNOSIS — I10 Essential (primary) hypertension: Secondary | ICD-10-CM

## 2017-10-07 DIAGNOSIS — Z6828 Body mass index (BMI) 28.0-28.9, adult: Secondary | ICD-10-CM

## 2017-10-07 LAB — BASIC METABOLIC PANEL
BUN: 15 mg/dL (ref 6–23)
CALCIUM: 9.5 mg/dL (ref 8.4–10.5)
CHLORIDE: 103 meq/L (ref 96–112)
CO2: 29 mEq/L (ref 19–32)
Creatinine, Ser: 0.6 mg/dL (ref 0.40–1.20)
GFR: 107.94 mL/min (ref >60.00)
Glucose, Bld: 88 mg/dL (ref 70–99)
Potassium: 4.1 mEq/L (ref 3.5–5.1)
SODIUM: 140 meq/L (ref 135–145)

## 2017-10-07 LAB — LIPID PANEL
CHOLESTEROL: 188 mg/dL (ref 0–200)
HDL: 61.4 mg/dL (ref >39.00)
LDL Cholesterol: 116 mg/dL — ABNORMAL HIGH (ref 0–99)
NonHDL: 126.98
TRIGLYCERIDES: 57 mg/dL (ref 0.0–149.0)
Total CHOL/HDL Ratio: 3
VLDL: 11.4 mg/dL (ref 0.0–40.0)

## 2017-10-07 NOTE — Patient Instructions (Addendum)
BEFORE YOU LEAVE: -labs -follow up: 3-4 months  Congratulations on the weight reduction!   We have ordered labs or studies at this visit. It can take up to 1-2 weeks for results and processing. IF results require follow up or explanation, we will call you with instructions. Clinically stable results will be released to your [facility]. If you have not heard from [place] or cannot find your results in [facility] in 2 weeks please contact our office at [phone].  If you are not yet signed up for [facility], please consider signing up.

## 2017-10-08 MED FILL — VIT D2 1.25 MG (50,000 UNIT: 1.25 MG | 28 days supply | Qty: 8 | Fill #1

## 2017-10-11 ENCOUNTER — Telehealth: Payer: Self-pay | Admitting: Family Medicine

## 2017-10-11 NOTE — Telephone Encounter (Signed)
Copied from [place] [id number]. Topic: Quick Communication - Lab Results >> Oct 11, 2017  3:31 PM Synthia Innocent wrote: Requesting lab work

## 2017-10-14 NOTE — Telephone Encounter (Signed)
Released in mychart

## 2017-10-15 NOTE — Telephone Encounter (Signed)
Noted  

## 2017-11-11 MED FILL — VIT D2 1.25 MG (50,000 UNIT: 1.25 MG | 28 days supply | Qty: 8 | Fill #2

## 2017-11-11 MED FILL — OXYBUTYNIN CL ER 15 MG TAB: 15 | 30 days supply | Qty: 30 | Fill #3

## 2017-11-13 ENCOUNTER — Other Ambulatory Visit: Payer: Self-pay | Admitting: Obstetrics

## 2017-11-13 DIAGNOSIS — Z1231 Encounter for screening mammogram for malignant neoplasm of breast: Secondary | ICD-10-CM

## 2017-12-02 MED FILL — LISINOPRIL 10 MG TABLET: 10 | 90 days supply | Qty: 90 | Fill #1

## 2017-12-03 DIAGNOSIS — I10 Essential (primary) hypertension: Secondary | ICD-10-CM | POA: Diagnosis not present

## 2017-12-03 DIAGNOSIS — B001 Herpesviral vesicular dermatitis: Secondary | ICD-10-CM | POA: Diagnosis not present

## 2017-12-04 ENCOUNTER — Telehealth: Payer: Self-pay | Admitting: Family Medicine

## 2017-12-04 NOTE — Telephone Encounter (Signed)
Relation to pt: self Call back number:[phone] Pharmacy:  [facility], [place], [place] [id number]  Reason for call:  Patient wanted to inform please send "pain medication" to [facility], [place], [place] [address] due to patient being at work, please advise

## 2017-12-04 NOTE — Telephone Encounter (Signed)
Copied from [place] [id number]. Topic: Quick Communication - See Telephone Encounter >> Dec 04, 2017  9:22 AM Gardiner Ramus wrote: CRM for notification. See Telephone encounter for: 12/04/17. Pt calling to get some pain medication. She was diagnosed shingles from [facility] on [address]. She was put on an antiviral valtrex. Please advise. The pain is not bad now but it is staring to get worse. She did not want to come in the office because she did not want to infect anyone.

## 2017-12-05 NOTE — Telephone Encounter (Signed)
So sorry to hear she has shingles. We can not send opiod pain medication (controlled med) without OV.if requiring more pain control then OTC or what [facility] provided do advise OV to evaluate. Thanks.

## 2017-12-05 NOTE — Telephone Encounter (Signed)
I called the pt and informed her of the message below.  She stated she will see how she feels and call back for an appt if needed.

## 2017-12-06 ENCOUNTER — Ambulatory Visit: Payer: Self-pay

## 2017-12-06 ENCOUNTER — Ambulatory Visit: Payer: 59 | Admitting: Adult Health

## 2017-12-06 ENCOUNTER — Encounter: Payer: Self-pay | Admitting: Adult Health

## 2017-12-06 VITALS — BP 140/80 | Temp 98.6°F | Wt 135.0 lb

## 2017-12-06 DIAGNOSIS — B029 Zoster without complications: Secondary | ICD-10-CM | POA: Diagnosis not present

## 2017-12-06 NOTE — Progress Notes (Signed)
Subjective:    Patient ID: [name], female    DOB: [birth year], 62 y.o.   MRN: [id number]  HPI  62 year old female who  has a past medical history of B12 DEFICIENCY (02/09/2009), Blood in stool, COLONIC POLYPS, HYPERPLASTIC, HX OF (02/03/2009), DIVERTICULOSIS, COLON (02/03/2009), Dyslipidemia (06/20/2012), Fracture, intertrochanteric, left femur ([place]) (05/02/2012), GERD (gastroesophageal reflux disease), Hearing loss, Hemorrhoids, HEMORRHOIDS (02/03/2009), Hypertension, INFLAMMATORY BOWEL DISEASE - Followed by Dr. Sharlett Iles in GI (02/03/2009), Osteopenia, Turner's syndrome, and Ulcerative colitis.  She is a patient of Dr. Maudie Mercury who I am seeing today for an acute issue. She was seen by [facility] 4 days ago and diagnosed with Shingles. She was started on Valtrex that day. She reports no pain but has " irritation" from clothes. She is wondering if she needs pain medications for the weekend.   She is also wondering when she can see her newborn grandchild.   Review of Systems See HPI   Past Medical History:  Diagnosis Date  . B12 DEFICIENCY 02/09/2009   Qualifier: Diagnosis of  By: Ronnald Ramp RN, CGRN, Sheri    . Blood in stool   . COLONIC POLYPS, HYPERPLASTIC, HX OF 02/03/2009   Qualifier: Diagnosis of  By: Surface RN, Butch Penny    . DIVERTICULOSIS, COLON 02/03/2009   Qualifier: Diagnosis of  By: Varney Daily RN, Butch Penny    . Dyslipidemia 06/20/2012  . Fracture, intertrochanteric, left femur ([place]) 05/02/2012  . GERD (gastroesophageal reflux disease)   . Hearing loss    Bil/has hearing aids  . Hemorrhoids   . HEMORRHOIDS 02/03/2009   Qualifier: Diagnosis of  By: Varney Daily RN, Butch Penny    . Hypertension   . INFLAMMATORY BOWEL DISEASE - Followed by Dr. Sharlett Iles in GI 02/03/2009   Qualifier: Diagnosis of  By: Varney Daily RN, Butch Penny    . Osteopenia   . Turner's syndrome    was on Provera and Premarin and d/c this  at age 57yr  . Ulcerative colitis     Social History   Socioeconomic History  . Marital  status: Married    Spouse name: Not on file  . Number of children: Not on file  . Years of education: Not on file  . Highest education level: Not on file  Occupational History  . Not on file  Social Needs  . Financial resource strain: Not on file  . Food insecurity:    Worry: Not on file    Inability: Not on file  . Transportation needs:    Medical: Not on file    Non-medical: Not on file  Tobacco Use  . Smoking status: Never Smoker  . Smokeless tobacco: Never Used  Substance and Sexual Activity  . Alcohol use: Yes    Alcohol/week: 0.6 oz    Types: 1 Glasses of wine per week    Comment: rare  . Drug use: No  . Sexual activity: Not on file  Lifestyle  . Physical activity:    Days per week: Not on file    Minutes per session: Not on file  . Stress: Not on file  Relationships  . Social connections:    Talks on phone: Not on file    Gets together: Not on file    Attends religious service: Not on file    Active member of club or organization: Not on file    Attends meetings of clubs or organizations: Not on file    Relationship status: Not on file  . Intimate partner violence:  Fear of current or ex partner: Not on file    Emotionally abused: Not on file    Physically abused: Not on file    Forced sexual activity: Not on file  Other Topics Concern  . Not on file  Social History Narrative   Works at 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 COLONOSCOPY    . FRACTURE SURGERY  2004   fx lt thumb  . HERNIA REPAIR     lt ing   . INTRAMEDULLARY (IM) NAIL INTERTROCHANTERIC  05/02/2012   Procedure: INTRAMEDULLARY (IM) NAIL INTERTROCHANTRIC;  Surgeon: Johnny Bridge, MD;  Location: [place];  Service: Orthopedics;  Laterality: Left;  . ORIF FINGER FRACTURE  06/14/2011   Procedure: OPEN REDUCTION INTERNAL FIXATION (ORIF)  METACARPAL (FINGER) FRACTURE;  Surgeon: Tennis Must, MD;  Location: [place];  Service: Orthopedics;  Laterality: Right;  right ring  . TONSILLECTOMY    . UPPER GASTROINTESTINAL ENDOSCOPY      Family History  Problem Relation Age of Onset  . Lymphoma Mother        nhl-mantle cell  . Lymphoma Father        nhl  . Colon cancer Neg Hx   . Esophageal cancer Neg Hx   . Stomach cancer Neg Hx   . Rectal cancer Neg Hx   . Breast cancer Neg Hx     No Known Allergies  Current Outpatient Medications on File Prior to Visit  Medication Sig Dispense Refill  . denosumab (PROLIA) 60 MG/ML SOLN injection Inject 60 mg into the skin every 6 (six) months. Administer in upper arm, thigh, or abdomen    . lisinopril (PRINIVIL,ZESTRIL) 10 MG tablet Take 1 tablet (10 mg total) by mouth daily. 90 tablet 3  . Multiple Vitamin (MULTIVITAMIN) tablet Take 1 tablet by mouth daily.    Marland Kitchen oxybutynin (DITROPAN XL) 15 MG 24 hr tablet TAKE 1 TABLET BY MOUTH EVERY 24 HOURS DAILY    . rosuvastatin (CRESTOR) 20 MG tablet Take 1 tablet (20 mg total) by mouth daily. 90 tablet 3  . valACYclovir (VALTREX) 1000 MG tablet Take by mouth.    . Vitamin D, Ergocalciferol, (DRISDOL) 50000 UNITS CAPS Take 1 capsule (50,000 Units total) by mouth every 7 (seven) days. 30 capsule 1   No current facility-administered medications on file prior to visit.     BP 140/80   Temp 98.6 F (37 C) (Oral)   Wt 135 lb (61.2 kg)   BMI 29.73 kg/m       Objective:   Physical Exam  Constitutional: She is oriented to person, place, and time. She appears well-developed and well-nourished. No distress.  Neurological: She is alert and oriented to person, place, and time.  Skin: Skin is warm and dry. Rash noted. Rash is vesicular. She is not diaphoretic. There is erythema.  Red vesicular rash throughout right T8-T9 dermatome. Rash from midline umbilical to midline spine. Vesicles appear to be healing well. No active drainage     Psychiatric: She has a normal mood and affect. Her behavior is normal. Judgment and thought content normal.  Vitals reviewed.     Assessment & Plan:  1. Herpes zoster without complication - Consistent with shingles.  - No pain at this time.  Advised patient that Tylenol or Motrin would be sufficient for pain relief; if needed. Follow up if pain starts - Finish course of Valtrex  - Follow up with signs of infection  - No contact with newborn until all lesions have crusted over.   Dorothyann Peng, NP

## 2017-12-09 MED FILL — VIT D2 1.25 MG (50,000 UNIT: 1.25 MG | 28 days supply | Qty: 8 | Fill #3

## 2017-12-17 MED FILL — OXYBUTYNIN CL ER 15 MG TAB: 15 | 30 days supply | Qty: 30 | Fill #0

## 2017-12-17 MED FILL — PROLIA 60 MG/ML SOLN: 60 | 30 days supply | Qty: 1 | Fill #0

## 2017-12-18 DIAGNOSIS — M810 Age-related osteoporosis without current pathological fracture: Secondary | ICD-10-CM | POA: Diagnosis not present

## 2017-12-24 MED FILL — ROSUVASTATIN CALCIUM 20 MG: 20 | 90 days supply | Qty: 90 | Fill #2

## 2017-12-30 ENCOUNTER — Inpatient Hospital Stay: Admission: RE | Admit: 2017-12-30 | Payer: Self-pay | Source: Ambulatory Visit

## 2018-01-01 ENCOUNTER — Ambulatory Visit
Admission: RE | Admit: 2018-01-01 | Discharge: 2018-01-01 | Disposition: A | Discharge status: Home / Self Care | Payer: 59 | Source: Ambulatory Visit | Attending: Obstetrics | Admitting: Obstetrics

## 2018-01-01 DIAGNOSIS — Z1231 Encounter for screening mammogram for malignant neoplasm of breast: Secondary | ICD-10-CM | POA: Diagnosis not present

## 2018-01-02 DIAGNOSIS — H0234 Blepharochalasis left upper eyelid: Secondary | ICD-10-CM | POA: Diagnosis not present

## 2018-01-02 DIAGNOSIS — H0231 Blepharochalasis right upper eyelid: Secondary | ICD-10-CM | POA: Diagnosis not present

## 2018-01-02 DIAGNOSIS — H35371 Puckering of macula, right eye: Secondary | ICD-10-CM | POA: Diagnosis not present

## 2018-01-02 DIAGNOSIS — H2513 Age-related nuclear cataract, bilateral: Secondary | ICD-10-CM | POA: Diagnosis not present

## 2018-01-22 DIAGNOSIS — E559 Vitamin D deficiency, unspecified: Secondary | ICD-10-CM | POA: Diagnosis not present

## 2018-02-05 MED FILL — OXYBUTYNIN CL ER 15 MG TAB: 15 | 30 days supply | Qty: 30 | Fill #0

## 2018-02-10 ENCOUNTER — Encounter: Payer: Self-pay | Admitting: Gastroenterology

## 2018-03-17 MED FILL — OXYBUTYNIN CL ER 15 MG TAB: 15 | 30 days supply | Qty: 30 | Fill #1

## 2018-03-20 MED FILL — LISINOPRIL 10 MG TABLET: 10 | 90 days supply | Qty: 90 | Fill #2

## 2018-04-01 DIAGNOSIS — M810 Age-related osteoporosis without current pathological fracture: Secondary | ICD-10-CM | POA: Diagnosis not present

## 2018-04-01 DIAGNOSIS — Z01419 Encounter for gynecological examination (general) (routine) without abnormal findings: Secondary | ICD-10-CM | POA: Diagnosis not present

## 2018-04-01 DIAGNOSIS — N3941 Urge incontinence: Secondary | ICD-10-CM | POA: Diagnosis not present

## 2018-04-09 ENCOUNTER — Encounter: Payer: Self-pay | Admitting: Gastroenterology

## 2018-04-23 ENCOUNTER — Ambulatory Visit: Payer: 59 | Admitting: Adult Health

## 2018-04-23 ENCOUNTER — Encounter: Payer: Self-pay | Admitting: Adult Health

## 2018-04-23 VITALS — BP 118/70 | Temp 98.2°F | Wt 140.0 lb

## 2018-04-23 DIAGNOSIS — J069 Acute upper respiratory infection, unspecified: Secondary | ICD-10-CM

## 2018-04-23 DIAGNOSIS — B9789 Other viral agents as the cause of diseases classified elsewhere: Secondary | ICD-10-CM

## 2018-04-23 LAB — POC INFLUENZA A&B (BINAX/QUICKVUE)
INFLUENZA B, POC: NEGATIVE
Influenza A, POC: NEGATIVE

## 2018-04-23 MED ORDER — BENZONATATE 200 MG PO CAPS: 200.0000 mg | ORAL_CAPSULE | Freq: Two times a day (BID) | ORAL | 1 refills | Status: DC | PRN

## 2018-04-23 NOTE — Progress Notes (Signed)
   Subjective:    Patient ID: [name], female    DOB: Nov 28, 1955, 62 y.o.   MRN: [id number]  URI   This is a new problem. The current episode started in the past 7 days (4 days). The problem has been waxing and waning. There has been no fever. Associated symptoms include congestion, coughing (dry ) and a sore throat. Pertinent negatives include no chest pain, diarrhea, ear pain, headaches, joint swelling, nausea, plugged ear sensation, rhinorrhea, sinus pain, swollen glands, vomiting or wheezing. Treatments tried: mucinex  The treatment provided no relief.     Review of Systems  Constitutional: Negative.   HENT: Positive for congestion, sore throat and voice change. Negative for ear pain, postnasal drip, rhinorrhea, sinus pain and trouble swallowing.   Respiratory: Positive for cough (dry ). Negative for chest tightness, shortness of breath and wheezing.   Cardiovascular: Negative for chest pain.  Gastrointestinal: Negative for diarrhea, nausea and vomiting.  Musculoskeletal: Negative.   Neurological: Negative for headaches.       Objective:   Physical Exam  Constitutional: She appears well-developed and well-nourished. No distress.  HENT:  Head: Normocephalic and atraumatic.  Right Ear: Hearing, tympanic membrane, external ear and ear canal normal.  Left Ear: Hearing, tympanic membrane, external ear and ear canal normal.  Nose: Nose normal. Right sinus exhibits no maxillary sinus tenderness and no frontal sinus tenderness. Left sinus exhibits no maxillary sinus tenderness and no frontal sinus tenderness.  Mouth/Throat: Uvula is midline, oropharynx is clear and moist and mucous membranes are normal. No oropharyngeal exudate, posterior oropharyngeal edema, posterior oropharyngeal erythema or tonsillar abscesses.  Eyes: Pupils are equal, round, and reactive to light. Conjunctivae and EOM are normal. Right eye exhibits no discharge. Left eye exhibits no discharge. No scleral icterus.   Neck: Normal range of motion. Neck supple.  Cardiovascular: Normal rate, regular rhythm, normal heart sounds and intact distal pulses.  Pulmonary/Chest: Effort normal and breath sounds normal.  Lymphadenopathy:    She has no cervical adenopathy.  Skin: Skin is warm and dry. She is not diaphoretic.  Psychiatric: She has a normal mood and affect. Her behavior is normal. Judgment and thought content normal.  Nursing note and vitals reviewed.     Assessment & Plan:  1. Viral URI with cough - No signs of bacterial infection. Will send in [place] for cough. Advised warm fluids and humidifier in her bedroom.  - Follow up if no improvement in the next 3-4 days or sooner if fever develops  - POC Influenza A&B(BINAX/QUICKVUE)- negative  - benzonatate (TESSALON) 200 MG capsule; Take 1 capsule (200 mg total) by mouth 2 (two) times daily as needed for cough.  Dispense: 20 capsule; Refill: 1  Dorothyann Peng, NP

## 2018-05-01 MED FILL — OXYBUTYNIN CL ER 15 MG TAB: 15 | 90 days supply | Qty: 90 | Fill #0

## 2018-05-05 ENCOUNTER — Encounter: Payer: Self-pay | Admitting: Gastroenterology

## 2018-05-05 ENCOUNTER — Ambulatory Visit (AMBULATORY_SURGERY_CENTER): Payer: Self-pay | Admitting: *Deleted

## 2018-05-05 VITALS — Ht <= 58 in | Wt 142.0 lb

## 2018-05-05 DIAGNOSIS — Z86010 Personal history of colonic polyps: Secondary | ICD-10-CM

## 2018-05-05 MED ORDER — NA SULFATE-K SULFATE-MG SULF 17.5-3.13-1.6 GM/177ML PO SOLN: ORAL | 0 refills | Status: DC

## 2018-05-05 MED FILL — SUPREP BOWEL PREP KIT: 17.5-3.13-1 | 1 days supply | Qty: 354 | Fill #0

## 2018-05-05 NOTE — Progress Notes (Signed)
Patient denies any allergies to eggs or soy. Patient denies any problems with anesthesia/sedation. Patient denies any oxygen use at home. Patient denies taking any diet/weight loss medications or blood thinners. EMMI education offered, pt declined.  

## 2018-05-13 MED FILL — ROSUVASTATIN CALCIUM 20 MG: 20 | 90 days supply | Qty: 90 | Fill #3

## 2018-05-15 ENCOUNTER — Ambulatory Visit: Payer: Self-pay | Admitting: Family Medicine

## 2018-05-16 ENCOUNTER — Ambulatory Visit: Payer: Self-pay | Admitting: Family Medicine

## 2018-05-19 ENCOUNTER — Ambulatory Visit (AMBULATORY_SURGERY_CENTER): Payer: 59 | Admitting: Gastroenterology

## 2018-05-19 ENCOUNTER — Encounter: Payer: Self-pay | Admitting: Gastroenterology

## 2018-05-19 VITALS — BP 136/88 | HR 70 | Temp 98.4°F | Resp 19 | Ht <= 58 in | Wt 140.0 lb

## 2018-05-19 DIAGNOSIS — Z86010 Personal history of colonic polyps: Secondary | ICD-10-CM | POA: Diagnosis not present

## 2018-05-19 DIAGNOSIS — K639 Disease of intestine, unspecified: Secondary | ICD-10-CM

## 2018-05-19 DIAGNOSIS — K5140 Inflammatory polyps of colon without complications: Secondary | ICD-10-CM | POA: Diagnosis not present

## 2018-05-19 DIAGNOSIS — K529 Noninfective gastroenteritis and colitis, unspecified: Secondary | ICD-10-CM | POA: Diagnosis not present

## 2018-05-19 DIAGNOSIS — D120 Benign neoplasm of cecum: Secondary | ICD-10-CM | POA: Diagnosis not present

## 2018-05-19 MED ORDER — SODIUM CHLORIDE 0.9 % IV SOLN
500.0000 mL | Freq: Once | INTRAVENOUS | Status: DC
Start: 2018-05-19 — End: 2023-10-23

## 2018-05-19 NOTE — Progress Notes (Signed)
Received patient in recovery post colonoscopy. Patient VSS, patient not passing air and c/o abdominal cramping. Attempt several positioning techniques in order to assist with passage of air. Patient then passed small amounts of air intermittently, but with minimal relief of pain. Dr. Loletha Carrow notified. Assisted patient to ambulate to BR where she was able to pass more air over a period of time and patient then reported a significant relief of abd cramping. Dr. Loletha Carrow notified. Report given to Arman Bogus, RN and returned to admitting.

## 2018-05-19 NOTE — Op Note (Signed)
[facility] Patient Name: [name] Procedure Date: 05/19/2018 11:07 AM MRN: [id number] Endoscopist: [place]. Loletha Carrow , MD Age: 62 Referring MD:  Date of Birth: 01-18-1956 Gender: Female Account #: [id number] Procedure:                Colonoscopy Indications:              Surveillance: Personal history of adenomatous                            polyps on last colonoscopy 5 years ago (74mm TA                            03/2013) Medicines:                Monitored Anesthesia Care Procedure:                Pre-Anesthesia Assessment:                           - Prior to the procedure, a History and Physical                            was performed, and patient medications and                            allergies were reviewed. The patient's tolerance of                            previous anesthesia was also reviewed. The risks                            and benefits of the procedure and the sedation                            options and risks were discussed with the patient.                            All questions were answered, and informed consent                            was obtained. Prior Anticoagulants: The patient has                            taken no previous anticoagulant or antiplatelet                            agents. ASA Grade Assessment: II - A patient with                            mild systemic disease. After reviewing the risks                            and benefits, the patient was deemed in  satisfactory condition to undergo the procedure.                           After obtaining informed consent, the colonoscope                            was passed under direct vision. Throughout the                            procedure, the patient's blood pressure, pulse, and                            oxygen saturations were monitored continuously. The                            Colonoscope was introduced through the anus and                      advanced to the the cecum, identified by                            appendiceal orifice and ileocecal valve. The                            colonoscopy was technically difficult and complex                            due to multiple diverticula in the colon,                            significant looping and a tortuous colon.                            Successful completion of the procedure was aided by                            changing the patient to a supine position, using                            manual pressure and withdrawing the pediatric                            colonoscope and replacing with the adult endoscope.                            The patient tolerated the procedure well. Scope In: 11:23:08 AM Scope Out: 12:06:25 PM Total Procedure Duration: 0 hours 43 minutes 17 seconds  Findings:                 The perianal and digital rectal examinations were                            normal.  The sigmoid colon was significantly redundant and                            tortuous with diverticulosis, making scope passage                            very challenging. Scope changed, as noted above.                           Diverticula were found in the sigmoid colon.                           A 2 mm polyp was found in the cecum. The polyp was                            sessile. The polyp was removed with a cold biopsy                            forceps. Resection and retrieval were complete.                           An area of nodular, inflamed mucosa was found over                            much of the ileocecal valve. Biopsies were taken                            with a cold forceps for histology. The terminal                            ileum could not be intubated due to scope looping                            and type of scope used.                           The exam was otherwise without abnormality on                             direct and retroflexion views. Complications:            No immediate complications. Estimated Blood Loss:     Estimated blood loss was minimal. Impression:               - Redundant colon.                           - Diverticulosis in the sigmoid colon.                           - One 2 mm polyp in the cecum, removed with a cold                            biopsy forceps. Resected and retrieved.                           -  Nodular mucosa at the ileocecal valve. Biopsied.                           - The examination was otherwise normal on direct                            and retroflexion views. Recommendation:           - Patient has a contact number available for                            emergencies. The signs and symptoms of potential                            delayed complications were discussed with the                            patient. Return to normal activities tomorrow.                            Written discharge instructions were provided to the                            patient.                           - Resume previous diet.                           - Continue present medications.                           - Await pathology results.                           - Repeat colonoscopy is recommended for                            surveillance. The colonoscopy date will be                            determined after pathology results from today's                            exam become available for review. [name], MD 05/19/2018 12:15:18 PM This report has been signed electronically.

## 2018-05-19 NOTE — Patient Instructions (Signed)
YOU HAD AN ENDOSCOPIC PROCEDURE TODAY AT THE [facility]:   Refer to the procedure report that was given to you for any specific questions about what was found during the examination.  If the procedure report does not answer your questions, please call your gastroenterologist to clarify.  If you requested that your care partner not be given the details of your procedure findings, then the procedure report has been included in a sealed envelope for you to review at your convenience later.  YOU SHOULD EXPECT: Some feelings of bloating in the abdomen. Passage of more gas than usual.  Walking can help get rid of the air that was put into your GI tract during the procedure and reduce the bloating. If you had a lower endoscopy (such as a colonoscopy or flexible sigmoidoscopy) you may notice spotting of blood in your stool or on the toilet paper. If you underwent a bowel prep for your procedure, you may not have a normal bowel movement for a few days.  Please Note:  You might notice some irritation and congestion in your nose or some drainage.  This is from the oxygen used during your procedure.  There is no need for concern and it should clear up in a day or so.  SYMPTOMS TO REPORT IMMEDIATELY:   Following lower endoscopy (colonoscopy or flexible sigmoidoscopy):  Excessive amounts of blood in the stool  Significant tenderness or worsening of abdominal pains  Swelling of the abdomen that is new, acute  Fever of 100F or higher  For urgent or emergent issues, a gastroenterologist can be reached at any hour by calling [phone].   DIET:  We do recommend a small meal at first, but then you may proceed to your regular diet.  Drink plenty of fluids but you should avoid alcoholic beverages for 24 hours.  MEDICATIONS: Continue present medications.  Please see handouts given to you by your recovery nurse.  ACTIVITY:  You should plan to take it easy for the rest of today and you should NOT  DRIVE or use heavy machinery until tomorrow (because of the sedation medicines used during the test).    FOLLOW UP: Our staff will call the number listed on your records the next business day following your procedure to check on you and address any questions or concerns that you may have regarding the information given to you following your procedure. If we do not reach you, we will leave a message.  However, if you are feeling well and you are not experiencing any problems, there is no need to return our call.  We will assume that you have returned to your regular daily activities without incident.  If any biopsies were taken you will be contacted by phone or by letter within the next 1-3 weeks.  Please call us at [phone] if you have not heard about the biopsies in 3 weeks.   Thank you for allowing us to provide for your healthcare needs today.   SIGNATURES/CONFIDENTIALITY: You and/or your care partner have signed paperwork which will be entered into your electronic medical record.  These signatures attest to the fact that that the information above on your After Visit Summary has been reviewed and is understood.  Full responsibility of the confidentiality of this discharge information lies with you and/or your care-partner. 

## 2018-05-19 NOTE — Progress Notes (Signed)
Called to room to assist during endoscopic procedure.  Patient ID and intended procedure confirmed with present staff. Received instructions for my participation in the procedure from the performing physician.  

## 2018-05-19 NOTE — Progress Notes (Signed)
Pt's states no medical or surgical changes since previsit or office visit. 

## 2018-05-19 NOTE — Progress Notes (Signed)
A/ox3, pleased with MAC, report to RN 

## 2018-05-20 ENCOUNTER — Telehealth: Payer: Self-pay

## 2018-05-20 NOTE — Telephone Encounter (Signed)
  Follow up Call-  Call back number 05/19/2018  Post procedure Call Back phone  # [phone]  Permission to leave phone message Yes  Some recent data might be hidden      No ID on voicemail therefore no message left

## 2018-05-26 ENCOUNTER — Encounter: Payer: Self-pay | Admitting: Family Medicine

## 2018-05-26 ENCOUNTER — Ambulatory Visit (INDEPENDENT_AMBULATORY_CARE_PROVIDER_SITE_OTHER): Payer: 59

## 2018-05-26 ENCOUNTER — Ambulatory Visit: Payer: 59 | Admitting: Family Medicine

## 2018-05-26 VITALS — BP 138/100 | HR 94 | Temp 97.7°F | Ht <= 58 in | Wt 140.8 lb

## 2018-05-26 DIAGNOSIS — J40 Bronchitis, not specified as acute or chronic: Secondary | ICD-10-CM

## 2018-05-26 DIAGNOSIS — R05 Cough: Secondary | ICD-10-CM | POA: Diagnosis not present

## 2018-05-26 MED ORDER — GUAIFENESIN-CODEINE 100-10 MG/5ML PO SOLN: 10.0000 mL | ORAL | 0 refills | Status: DC | PRN

## 2018-05-26 MED ORDER — FLUTICASONE PROPIONATE 50 MCG/ACT NA SUSP: 2.0000 | Freq: Every day | NASAL | 6 refills | Status: DC

## 2018-05-26 MED ORDER — ALBUTEROL SULFATE 108 (90 BASE) MCG/ACT IN AEPB: 1.0000 | INHALATION_SPRAY | RESPIRATORY_TRACT | 1 refills | Status: DC | PRN

## 2018-05-26 NOTE — Progress Notes (Signed)
Patient: [name] MRN: [id number] DOB: 10-24-1955 PCP: Lucretia Kern, DO     Subjective:  Chief Complaint  Patient presents with  . Cough    x 1 month    HPI: The patient is a 63 y.o. female who presents today for cough x 1 month. She states she was 99% better and then had a colonscopy last Monday and after this her cough started back up. Cough started back on Tuesday after her cscope. No fever/chills. She has no other symptoms associated with this. She saw her NP about one month ago and was told she had viral URI. Tested negative for flu. Given tessalon pearls that did not help her. Mucinex has helped her. She has not taken anything else this past week. Cough is mainly dry in nature. She is not SOB and denies any wheezing. No asthma, copd and she is not a smoker. She states her husband is sick with similar symptoms.   Review of Systems  Constitutional: Negative for chills, fatigue and fever.  HENT: Positive for congestion. Negative for ear pain, postnasal drip, rhinorrhea, sinus pressure, sinus pain and sore throat.   Respiratory: Positive for cough and shortness of breath. Negative for chest tightness.        C/o intermittent SOB w/cough  Cardiovascular: Negative for chest pain.  Gastrointestinal: Negative for abdominal pain, nausea and vomiting.  Musculoskeletal: Negative for back pain and neck pain.  Neurological: Negative for dizziness and headaches.  Psychiatric/Behavioral: Positive for sleep disturbance.    Allergies Patient has No Known Allergies.  Past Medical History Patient  has a past medical history of B12 DEFICIENCY (02/09/2009), Blood in stool, COLONIC POLYPS, HYPERPLASTIC, HX OF (02/03/2009), DIVERTICULOSIS, COLON (02/03/2009), Dyslipidemia (06/20/2012), Fracture, intertrochanteric, left femur ([place]) (05/02/2012), GERD (gastroesophageal reflux disease), Hearing loss, Hemorrhoids, HEMORRHOIDS (02/03/2009), Hypertension, INFLAMMATORY BOWEL DISEASE - Followed by Dr.  Sharlett Iles in GI (02/03/2009), Osteopenia, Turner's syndrome, and Ulcerative colitis.  Surgical History Patient  has a past surgical history that includes Hernia repair; Tonsillectomy; Fracture surgery (2004); Upper gastrointestinal endoscopy; ORIF finger fracture (06/14/2011); Intramedullary (im) nail intertrochanteric (05/02/2012); Polypectomy; and Colonoscopy (last 03/25/2013).  Family History Pateint's family history includes Lymphoma in her father and mother.  Social History Patient  reports that she has never smoked. She has never used smokeless tobacco. She reports current alcohol use of about 1.0 standard drinks of alcohol per week. She reports that she does not use drugs.    Objective: Vitals:   05/26/18 1428  BP: (!) 138/100  Pulse: 94  Temp: 97.7 F (36.5 C)  TempSrc: Oral  SpO2: 98%  Weight: 140 lb 12.8 oz (63.9 kg)  Height: 4\' 9"  (1.448 m)    Body mass index is 30.47 kg/m.  Physical Exam Vitals signs reviewed.  Constitutional:      General: She is not in acute distress.    Appearance: Normal appearance.  HENT:     Right Ear: Tympanic membrane and ear canal normal.     Left Ear: Tympanic membrane and ear canal normal.     Nose: Nose normal.  Neck:     Musculoskeletal: Normal range of motion and neck supple.  Cardiovascular:     Rate and Rhythm: Normal rate and regular rhythm.     Heart sounds: No murmur.  Pulmonary:     Effort: Pulmonary effort is normal. No respiratory distress.     Breath sounds: Normal breath sounds. No wheezing or rales.  Abdominal:     General: Bowel sounds are  normal.     Palpations: Abdomen is soft.  Lymphadenopathy:     Cervical: No cervical adenopathy.  Skin:    Capillary Refill: Capillary refill takes less than 2 seconds.  Neurological:     General: No focal deficit present.     Mental Status: She is alert.       CXR: no acute consolidation. Appears more bronchitic in nature. Official read pending.   Assessment/plan: 1.  Bronchitis Conservative therapy with cool mist humidifier at night, rest/fluids, and honey daily. Recommend 1 tablespoon/day. Also recommended over the counter anti tussive medication: robitussin DM during the day and could do a nyquil at night.  Discussed viral in nature and no indication for antibiotics at this point. Precautions given for worsening symptoms, fever, shortness of breath to let us know immediatly. Also doing codeine cough syrup at night. Drowsy precautions given.   - DG Chest 2 View     Return if symptoms worsen or fail to improve.   Orma Flaming, MD [place]   05/26/2018

## 2018-05-26 NOTE — Patient Instructions (Signed)
Start flonase at night Cool mist humidifier Honey daily Codeine cough syrup at night   Acute Bronchitis, Adult Acute bronchitis is when air tubes (bronchi) in the lungs suddenly get swollen. The condition can make it hard to breathe. It can also cause these symptoms:  A cough.  Coughing up clear, yellow, or green mucus.  Wheezing.  Chest congestion.  Shortness of breath.  A fever.  Body aches.  Chills.  A sore throat. Follow these instructions at home:  Medicines  Take over-the-counter and prescription medicines only as told by your doctor.  If you were prescribed an antibiotic medicine, take it as told by your doctor. Do not stop taking the antibiotic even if you start to feel better. General instructions  Rest.  Drink enough fluids to keep your pee (urine) pale yellow.  Avoid smoking and secondhand smoke. If you smoke and you need help quitting, ask your doctor. Quitting will help your lungs heal faster.  Use an inhaler, cool mist vaporizer, or humidifier as told by your doctor.  Keep all follow-up visits as told by your doctor. This is important. How is this prevented? To lower your risk of getting this condition again:  Wash your hands often with soap and water. If you cannot use soap and water, use hand sanitizer.  Avoid contact with people who have cold symptoms.  Try not to touch your hands to your mouth, nose, or eyes.  Make sure to get the flu shot every year. Contact a doctor if:  Your symptoms do not get better in 2 weeks. Get help right away if:  You cough up blood.  You have chest pain.  You have very bad shortness of breath.  You become dehydrated.  You faint (pass out) or keep feeling like you are going to pass out.  You keep throwing up (vomiting).  You have a very bad headache.  Your fever or chills gets worse. This information is not intended to replace advice given to you by your health care provider. Make sure you discuss  any questions you have with your health care provider. Document Released: 10/24/2007 Document Revised: 12/19/2016 Document Reviewed: 10/26/2015 Elsevier Interactive Patient Education  2019 [employer].

## 2018-05-27 ENCOUNTER — Other Ambulatory Visit: Payer: Self-pay | Admitting: Family Medicine

## 2018-05-27 ENCOUNTER — Encounter: Payer: Self-pay | Admitting: Gastroenterology

## 2018-05-27 ENCOUNTER — Telehealth: Payer: Self-pay | Admitting: Family Medicine

## 2018-05-27 MED ORDER — ALBUTEROL SULFATE HFA 108 (90 BASE) MCG/ACT IN AERS: 2.0000 | INHALATION_SPRAY | Freq: Four times a day (QID) | RESPIRATORY_TRACT | 0 refills | Status: DC | PRN

## 2018-05-27 NOTE — Telephone Encounter (Signed)
Called and left detailed message on patient's cell phone.  Dr. Rogers Blocker not in office today and will return to office in am on 1/8.  Will forward this message to her to see what she can change medication to.

## 2018-05-27 NOTE — Telephone Encounter (Signed)
Copied from [place] [id number]. Topic: General - Inquiry >> May 27, 2018 10:47 AM Judyann Munson wrote: Reason for CRM:  patient is calling to state the medication  Albuterol Sulfate (PROAIR RESPICLICK) 583 (90 Base) MCG/ACT AEPB, is not in her tier and the medication cost 70$ she is hoping something different can be called in cheaper due to her insurance. The patient is requesing a call back for a update. Please advise

## 2018-05-27 NOTE — Telephone Encounter (Signed)
See note

## 2018-05-27 NOTE — Telephone Encounter (Signed)
Sent in another inhaler that is hopefully much cheaper, otherwise she is going to need to call her insurance and see what they will cover.

## 2018-05-28 NOTE — Telephone Encounter (Signed)
Returned call to patient but was unable to leave voicemail message for pt due to voice mailbox being full.  CRM created.

## 2018-05-29 NOTE — Telephone Encounter (Signed)
Attempted to call patient on her cell phone; voice mailbox was full so I was unable to leave message.

## 2018-05-30 ENCOUNTER — Telehealth: Payer: Self-pay | Admitting: Radiology

## 2018-05-30 NOTE — Telephone Encounter (Signed)
Please advise. Pt is a Dr. Maudie Mercury Pt but was seen by Dr. Rogers Blocker for chest x-ray and has additional questions.

## 2018-05-30 NOTE — Telephone Encounter (Signed)
Copied from [place] [id number]. Topic: General - Inquiry >> May 30, 2018 10:36 AM Reyne Dumas L wrote: Reason for CRM:   Pt states she has seen chest x-ray results on MyChart and she has follow up questions. Pt can be reached at [phone]. >> May 30, 2018 11:12 AM Cox, Melburn Hake, CMA wrote: This xray was ordered by Dr Rogers Blocker.

## 2018-05-30 NOTE — Telephone Encounter (Signed)
Called and spoke with patient.  She had questions about her xray results.  I advised per Dr. Rogers Blocker that bronchitis sometimes looks like copd on CXR and that if she is still having symptoms or feeling worse she needs to f/u with Dr. Maudie Mercury.  Also advised pt that a different inhaler had been sent to her pharmacy per her request.  She verbalized understanding.

## 2018-06-03 ENCOUNTER — Ambulatory Visit: Payer: Self-pay | Admitting: Family Medicine

## 2018-06-04 NOTE — Progress Notes (Signed)
HPI:  Using dictation device. Unfortunately this device frequently misinterprets words/phrases. Due for flu shot and pap smear.  Acute visit for cough: -x4-6 weeks -had a cold in December - sounds like cleared up then restarted around 2 weeks ago, she saw another provider 1/6, husband sick with similar, had cxr with bronchitis and it concerned her that the report said "mild hyperinflation, suspicious for COPD"  -has tried: tessalon - didn't help, albuterol, musinex - did help, albuterol didn't help -no fevers, chills, hemoptysis, wheezing or sob, gerd -does have some pnd   ROS: See pertinent positives and negatives per HPI.  Past Medical History:  Diagnosis Date  . B12 DEFICIENCY 02/09/2009   Qualifier: Diagnosis of  By: Ronnald Ramp RN, CGRN, Sheri    . Blood in stool   . COLONIC POLYPS, HYPERPLASTIC, HX OF 02/03/2009   Qualifier: Diagnosis of  By: Surface RN, Butch Penny    . DIVERTICULOSIS, COLON 02/03/2009   Qualifier: Diagnosis of  By: Varney Daily RN, Butch Penny    . Dyslipidemia 06/20/2012  . Fracture, intertrochanteric, left femur ([place]) 05/02/2012  . GERD (gastroesophageal reflux disease)   . Hearing loss    Bil/has hearing aids  . Hemorrhoids   . HEMORRHOIDS 02/03/2009   Qualifier: Diagnosis of  By: Varney Daily RN, Butch Penny    . Hypertension   . INFLAMMATORY BOWEL DISEASE - Followed by Dr. Sharlett Iles in GI 02/03/2009   Qualifier: Diagnosis of  By: Varney Daily RN, Butch Penny    . Osteopenia   . Turner's syndrome    was on Provera and Premarin and d/c this  at age 5yr  . Ulcerative colitis     Past Surgical History:  Procedure Laterality Date  . COLONOSCOPY  last 03/25/2013  . FRACTURE SURGERY  2004   fx lt thumb  . HERNIA REPAIR     lt ing   . INTRAMEDULLARY (IM) NAIL INTERTROCHANTERIC  05/02/2012   Procedure: INTRAMEDULLARY (IM) NAIL INTERTROCHANTRIC;  Surgeon: Johnny Bridge, MD;  Location: [facility];  Service: Orthopedics;  Laterality: Left;  . ORIF FINGER FRACTURE  06/14/2011   Procedure: OPEN  REDUCTION INTERNAL FIXATION (ORIF) METACARPAL (FINGER) FRACTURE;  Surgeon: Tennis Must, MD;  Location: [facility];  Service: Orthopedics;  Laterality: Right;  right ring  . POLYPECTOMY    . TONSILLECTOMY    . UPPER GASTROINTESTINAL ENDOSCOPY      Family History  Problem Relation Age of Onset  . Lymphoma Mother        nhl-mantle cell  . Lymphoma Father        nhl  . Colon cancer Neg Hx   . Esophageal cancer Neg Hx   . Stomach cancer Neg Hx   . Rectal cancer Neg Hx   . Breast cancer Neg Hx   . Colon polyps Neg Hx     SOCIAL HX: see hpi   Current Outpatient Medications:  .  albuterol (PROVENTIL HFA;VENTOLIN HFA) 108 (90 Base) MCG/ACT inhaler, Inhale 2 puffs into the lungs every 6 (six) hours as needed for wheezing or shortness of breath., Disp: 1 Inhaler, Rfl: 0 .  Calcium Carbonate Antacid (CALCIUM CARBONATE PO), Take by mouth., Disp: , Rfl:  .  denosumab (PROLIA) 60 MG/ML SOLN injection, Inject 60 mg into the skin every 6 (six) months. Administer in upper arm, thigh, or abdomen, Disp: , Rfl:  .  fluticasone (FLONASE) 50 MCG/ACT nasal spray, Place 2 sprays into both nostrils daily., Disp: 16 g, Rfl: 6 .  guaiFENesin-codeine 100-10 MG/5ML syrup, Take  10 mLs by mouth every 4 (four) hours as needed for cough., Disp: 120 mL, Rfl: 0 .  lisinopril (PRINIVIL,ZESTRIL) 10 MG tablet, Take 1 tablet (10 mg total) by mouth daily., Disp: 90 tablet, Rfl: 3 .  oxybutynin (DITROPAN XL) 15 MG 24 hr tablet, TAKE 1 TABLET BY MOUTH EVERY 24 HOURS DAILY, Disp: , Rfl:  .  rosuvastatin (CRESTOR) 20 MG tablet, Take 1 tablet (20 mg total) by mouth daily., Disp: 90 tablet, Rfl: 3 .  Vitamin D, Ergocalciferol, (DRISDOL) 50000 UNITS CAPS, Take 1 capsule (50,000 Units total) by mouth every 7 (seven) days., Disp: 30 capsule, Rfl: 1 .  predniSONE (DELTASONE) 20 MG tablet, Take 2 tablets once a day with food, Disp: 8 tablet, Rfl: 0  Current Facility-Administered Medications:  .  0.9 %  sodium  chloride infusion, 500 mL, Intravenous, Once, Danis, Estill Cotta III, MD  EXAM:  Vitals:   06/05/18 1552  BP: 124/76  Pulse: 90  Temp: 98.3 F (36.8 C)  SpO2: 98%    Body mass index is 30.56 kg/m.  GENERAL: vitals reviewed and listed above, alert, oriented, appears well hydrated and in no acute distress  HEENT: atraumatic, conjunttiva clear, no obvious abnormalities on inspection of external nose and ears  NECK: no obvious masses on inspection  LUNGS: clear to auscultation bilaterally, no wheezes, rales or rhonchi, good air movement  CV: HRRR, no peripheral edema  MS: moves all extremities without noticeable abnormality  PSYCH: pleasant and cooperative, no obvious depression or anxiety  ASSESSMENT AND PLAN:  Discussed the following assessment and plan:  Cough  -we discussed possible serious and likely etiologies, workup and treatment, treatment risks and return precautions, suspect postviral most likely vs pnd, vs other, silent gerd also a possibility among others -after this discussion, Adelis opted for prednisone, nasale saline, pulmonology eval if not improving -follow up advised as needed if symptoms not resolving -of course, we advised Anahla  to return or notify a doctor immediately if symptoms worsen or persist or new concerns arise. Declined avs.  There are no Patient Instructions on file for this visit.  Lucretia Kern, DO

## 2018-06-05 ENCOUNTER — Encounter: Payer: Self-pay | Admitting: Family Medicine

## 2018-06-05 ENCOUNTER — Ambulatory Visit: Payer: 59 | Admitting: Family Medicine

## 2018-06-05 VITALS — BP 124/76 | HR 90 | Temp 98.3°F | Ht <= 58 in | Wt 141.2 lb

## 2018-06-05 DIAGNOSIS — R05 Cough: Secondary | ICD-10-CM

## 2018-06-05 DIAGNOSIS — R059 Cough, unspecified: Secondary | ICD-10-CM

## 2018-06-05 MED ORDER — PREDNISONE 20 MG PO TABS: ORAL_TABLET | ORAL | 0 refills | Status: DC

## 2018-06-23 ENCOUNTER — Ambulatory Visit: Payer: Self-pay

## 2018-06-23 NOTE — Telephone Encounter (Signed)
Returned call to patient who states she hit the back of her head on her piano.  She states that she was bent over fixing the cat litter and came up and bangs the back of her head.  She states that she was fine no LOC. She has been working and functioning normally. She has no nausea blurred vision. She is concerned because she keeps getting intermittent pain to the area. She rates that pain at worse a 3. She states now it is 0 or 1. Appointment scheduled per protocol. Care advice read to patient. Pt verbalized understanding of all instructions. Reason for Disposition . [1] After 72 hours AND [2] headache persists  Answer Assessment - Initial Assessment Questions 1. MECHANISM: "How did the injury happen?" For falls, ask: "What height did you fall from?" and "What surface did you fall against?"      Bumped on piano last week 2. ONSET: "When did the injury happen?" (Minutes or hours ago)      Last week 3. NEUROLOGIC SYMPTOMS: "Was there any loss of consciousness?" "Are there any other neurological symptoms?"      no 4. MENTAL STATUS: "Does the person know who he is, who you are, and where he is?"      yes 5. LOCATION: "What part of the head was hit?"      Back of head 6. SCALP APPEARANCE: "What does the scalp look like? Is it bleeding now?" If so, ask: "Is it difficult to stop?"      No bleeding 7. SIZE: For cuts, bruises, or swelling, ask: "How large is it?" (e.g., inches or centimeters)      no 8. PAIN: "Is there any pain?" If so, ask: "How bad is it?"  (e.g., Scale 1-10; or mild, moderate, severe)     0 now worst 3 9. TETANUS: For any breaks in the skin, ask: "When was the last tetanus booster?"    yes 10. OTHER SYMPTOMS: "Do you have any other symptoms?" (e.g., neck pain, vomiting)      No none 11. PREGNANCY: "Is there any chance you are pregnant?" "When was your last menstrual period?"       N/A  Protocols used: HEAD INJURY-A-AH

## 2018-06-24 ENCOUNTER — Encounter: Payer: Self-pay | Admitting: Family Medicine

## 2018-06-24 ENCOUNTER — Ambulatory Visit: Payer: 59 | Admitting: Family Medicine

## 2018-06-24 VITALS — BP 118/62 | HR 84 | Temp 98.3°F | Ht <= 58 in

## 2018-06-24 DIAGNOSIS — R0981 Nasal congestion: Secondary | ICD-10-CM

## 2018-06-24 DIAGNOSIS — S0990XA Unspecified injury of head, initial encounter: Secondary | ICD-10-CM | POA: Diagnosis not present

## 2018-06-24 DIAGNOSIS — M791 Myalgia, unspecified site: Secondary | ICD-10-CM

## 2018-06-24 NOTE — Patient Instructions (Signed)
Nasal saline twice daily.  Heat and gentle stretching.  I hope you are feeling better soon! Seek care promptly if your symptoms worsen, new concerns arise or you are not improving with treatment.

## 2018-06-24 NOTE — Progress Notes (Signed)
HPI:  Using dictation device. Unfortunately this device frequently misinterprets words/phrases.  Acute visit for several issues:  Nasal congestion: -about 1 week -thick stuffy nose, pnd -no fevers, body aches, sob, sinus pain, travel to [place]  Head pain: -was sitting on floor and hit head on her piano about 5-6 days ago -mild soreness back of head on and off  -no mod or sever pain, no loc, no bump, no lac, no dizziness, no neck ore radiating pain, no persistent or severe headache, no n/v/vision/speach or neuro symptoms  ROS: See pertinent positives and negatives per HPI.  Past Medical History:  Diagnosis Date  . B12 DEFICIENCY 02/09/2009   Qualifier: Diagnosis of  By: Ronnald Ramp RN, CGRN, Sheri    . Blood in stool   . COLONIC POLYPS, HYPERPLASTIC, HX OF 02/03/2009   Qualifier: Diagnosis of  By: Surface RN, Butch Penny    . DIVERTICULOSIS, COLON 02/03/2009   Qualifier: Diagnosis of  By: Varney Daily RN, Butch Penny    . Dyslipidemia 06/20/2012  . Fracture, intertrochanteric, left femur ([place]) 05/02/2012  . GERD (gastroesophageal reflux disease)   . Hearing loss    Bil/has hearing aids  . Hemorrhoids   . HEMORRHOIDS 02/03/2009   Qualifier: Diagnosis of  By: Varney Daily RN, Butch Penny    . Hypertension   . INFLAMMATORY BOWEL DISEASE - Followed by Dr. Sharlett Iles in GI 02/03/2009   Qualifier: Diagnosis of  By: Varney Daily RN, Butch Penny    . Osteopenia   . Turner's syndrome    was on Provera and Premarin and d/c this  at age 81yr  . Ulcerative colitis     Past Surgical History:  Procedure Laterality Date  . COLONOSCOPY  last 03/25/2013  . FRACTURE SURGERY  2004   fx lt thumb  . HERNIA REPAIR     lt ing   . INTRAMEDULLARY (IM) NAIL INTERTROCHANTERIC  05/02/2012   Procedure: INTRAMEDULLARY (IM) NAIL INTERTROCHANTRIC;  Surgeon: Johnny Bridge, MD;  Location: [facility];  Service: Orthopedics;  Laterality: Left;  . ORIF FINGER FRACTURE  06/14/2011   Procedure: OPEN REDUCTION INTERNAL FIXATION (ORIF) METACARPAL (FINGER)  FRACTURE;  Surgeon: Tennis Must, MD;  Location: [facility];  Service: Orthopedics;  Laterality: Right;  right ring  . POLYPECTOMY    . TONSILLECTOMY    . UPPER GASTROINTESTINAL ENDOSCOPY      Family History  Problem Relation Age of Onset  . Lymphoma Mother        nhl-mantle cell  . Lymphoma Father        nhl  . Colon cancer Neg Hx   . Esophageal cancer Neg Hx   . Stomach cancer Neg Hx   . Rectal cancer Neg Hx   . Breast cancer Neg Hx   . Colon polyps Neg Hx     SOCIAL HX: se ehpi   Current Outpatient Medications:  .  albuterol (PROVENTIL HFA;VENTOLIN HFA) 108 (90 Base) MCG/ACT inhaler, Inhale 2 puffs into the lungs every 6 (six) hours as needed for wheezing or shortness of breath., Disp: 1 Inhaler, Rfl: 0 .  Calcium Carbonate Antacid (CALCIUM CARBONATE PO), Take by mouth., Disp: , Rfl:  .  denosumab (PROLIA) 60 MG/ML SOLN injection, Inject 60 mg into the skin every 6 (six) months. Administer in upper arm, thigh, or abdomen, Disp: , Rfl:  .  fluticasone (FLONASE) 50 MCG/ACT nasal spray, Place 2 sprays into both nostrils daily., Disp: 16 g, Rfl: 6 .  guaiFENesin-codeine 100-10 MG/5ML syrup, Take 10 mLs by mouth  every 4 (four) hours as needed for cough., Disp: 120 mL, Rfl: 0 .  lisinopril (PRINIVIL,ZESTRIL) 10 MG tablet, Take 1 tablet (10 mg total) by mouth daily., Disp: 90 tablet, Rfl: 3 .  oxybutynin (DITROPAN XL) 15 MG 24 hr tablet, TAKE 1 TABLET BY MOUTH EVERY 24 HOURS DAILY, Disp: , Rfl:  .  rosuvastatin (CRESTOR) 20 MG tablet, Take 1 tablet (20 mg total) by mouth daily., Disp: 90 tablet, Rfl: 3 .  Vitamin D, Ergocalciferol, (DRISDOL) 50000 UNITS CAPS, Take 1 capsule (50,000 Units total) by mouth every 7 (seven) days., Disp: 30 capsule, Rfl: 1  Current Facility-Administered Medications:  .  0.9 %  sodium chloride infusion, 500 mL, Intravenous, Once, Danis, Estill Cotta III, MD  EXAM:  Vitals:   06/24/18 1545  BP: 118/62  Pulse: 84  Temp: 98.3 F (36.8 C)   SpO2: 98%    Body mass index is 30.56 kg/m.  GENERAL: vitals reviewed and listed above, alert, oriented, appears well hydrated and in no acute distress  HEENT: atraumatic, conjunttiva clear, no obvious abnormalities on inspection of external nose and ears, normal appearance of ear canals and TMs, clear nasal congestion, mild post oropharyngeal erythema with PND, no tonsillar edema or exudate, no sinus TTP  NECK: no obvious masses on inspection  LUNGS: clear to auscultation bilaterally, no wheezes, rales or rhonchi, good air movement  CV: HRRR, no peripheral edema  MS: moves all extremities without noticeable abnormality, mild TTP muscle of post occiput bilat, no lump or edema, no bony TP, head or neck, normal ROM head and neck  PSYCH/NEURP: pleasant and cooperative, no obvious depression or anxiety, speech and thought processing grossly intact, cranial nerves II through XII grossly intact, finger-to-nose normal, visual field testing grossly intact, gait normal  ASSESSMENT AND PLAN:  Discussed the following assessment and plan:  Injury of head, initial encounter Muscle soreness -Needs like she had a very minor head injury with very mild symptoms, she has not required any medications or treatment for this as she reports that this is very mild, discussed potential etiologies of neck pain and this is likely muscular.  Discussed options for further evaluation including imaging, declined today as she feels is improving and mild.  Commended follow-up if any symptoms worsen or if they persist.  Sinus congestion Allergies are mild viral upper respiratory illness.  For conservative symptomatic care.  Saline. -Patient advised to return or notify a doctor immediately if symptoms worsen or persist or new concerns arise.  Patient Instructions  Nasal saline twice daily.  Heat and gentle stretching.  I hope you are feeling better soon! Seek care promptly if your symptoms worsen, new concerns  arise or you are not improving with treatment.     Lucretia Kern, DO

## 2018-07-22 ENCOUNTER — Ambulatory Visit: Payer: 59 | Admitting: Family Medicine

## 2018-07-22 ENCOUNTER — Encounter: Payer: Self-pay | Admitting: *Deleted

## 2018-07-22 ENCOUNTER — Encounter: Payer: Self-pay | Admitting: Family Medicine

## 2018-07-22 VITALS — BP 128/90 | HR 90 | Temp 98.3°F | Ht <= 58 in

## 2018-07-22 DIAGNOSIS — R197 Diarrhea, unspecified: Secondary | ICD-10-CM | POA: Diagnosis not present

## 2018-07-22 DIAGNOSIS — R0981 Nasal congestion: Secondary | ICD-10-CM | POA: Diagnosis not present

## 2018-07-22 NOTE — Progress Notes (Signed)
HPI:  Using dictation device. Unfortunately this device frequently misinterprets words/phrases.   Nasal congestion: -started:chronic for a long time, at least months -symptoms:nasal congestion, pnd -denies:fever, SOB, NVD, tooth pain, sinus pain, wheezing, cough, sore throat -has tried: nasal saline -seeing the Ear nose and throat doctor about this in a few weeks  Acute diarrhea: -started 3 days ago, worse then -multiple episodes of diarrhea - nonbloody, not watering  -no sig abd pain, fever, nausea or vomiting -grandkids sick with same and she was with them  -no recent travel or abx  ROS: See pertinent positives and negatives per HPI.  Past Medical History:  Diagnosis Date  . B12 DEFICIENCY 02/09/2009   Qualifier: Diagnosis of  By: Ronnald Ramp RN, CGRN, Sheri    . Blood in stool   . COLONIC POLYPS, HYPERPLASTIC, HX OF 02/03/2009   Qualifier: Diagnosis of  By: Surface RN, Butch Penny    . DIVERTICULOSIS, COLON 02/03/2009   Qualifier: Diagnosis of  By: Varney Daily RN, Butch Penny    . Dyslipidemia 06/20/2012  . Fracture, intertrochanteric, left femur ([place]) 05/02/2012  . GERD (gastroesophageal reflux disease)   . Hearing loss    Bil/has hearing aids  . Hemorrhoids   . HEMORRHOIDS 02/03/2009   Qualifier: Diagnosis of  By: Varney Daily RN, Butch Penny    . Hypertension   . INFLAMMATORY BOWEL DISEASE - Followed by Dr. Sharlett Iles in GI 02/03/2009   Qualifier: Diagnosis of  By: Varney Daily RN, Butch Penny    . Osteopenia   . Turner's syndrome    was on Provera and Premarin and d/c this  at age 12yr  . Ulcerative colitis     Past Surgical History:  Procedure Laterality Date  . COLONOSCOPY  last 03/25/2013  . FRACTURE SURGERY  2004   fx lt thumb  . HERNIA REPAIR     lt ing   . INTRAMEDULLARY (IM) NAIL INTERTROCHANTERIC  05/02/2012   Procedure: INTRAMEDULLARY (IM) NAIL INTERTROCHANTRIC;  Surgeon: Johnny Bridge, MD;  Location: [facility];  Service: Orthopedics;  Laterality: Left;  . ORIF FINGER FRACTURE  06/14/2011   Procedure: OPEN REDUCTION INTERNAL FIXATION (ORIF) METACARPAL (FINGER) FRACTURE;  Surgeon: Tennis Must, MD;  Location: [facility];  Service: Orthopedics;  Laterality: Right;  right ring  . POLYPECTOMY    . TONSILLECTOMY    . UPPER GASTROINTESTINAL ENDOSCOPY      Family History  Problem Relation Age of Onset  . Lymphoma Mother        nhl-mantle cell  . Lymphoma Father        nhl  . Colon cancer Neg Hx   . Esophageal cancer Neg Hx   . Stomach cancer Neg Hx   . Rectal cancer Neg Hx   . Breast cancer Neg Hx   . Colon polyps Neg Hx     Social History   Socioeconomic History  . Marital status: Married    Spouse name: Not on file  . Number of children: Not on file  . Years of education: Not on file  . Highest education level: Not on file  Occupational History  . Not on file  Social Needs  . Financial resource strain: Not on file  . Food insecurity:    Worry: Not on file    Inability: Not on file  . Transportation needs:    Medical: Not on file    Non-medical: Not on file  Tobacco Use  . Smoking status: Never Smoker  . Smokeless tobacco: Never Used  Substance  and Sexual Activity  . Alcohol use: Yes    Alcohol/week: 1.0 standard drinks    Types: 1 Glasses of wine per week    Comment: occ.  . Drug use: No  . Sexual activity: Not on file  Lifestyle  . Physical activity:    Days per week: Not on file    Minutes per session: Not on file  . Stress: Not on file  Relationships  . Social connections:    Talks on phone: Not on file    Gets together: Not on file    Attends religious service: Not on file    Active member of club or organization: Not on file    Attends meetings of clubs or organizations: Not on file    Relationship status: Not on file  Other Topics Concern  . Not on file  Social History Narrative   Works at 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  Calcium Carbonate Antacid (CALCIUM CARBONATE PO), Take by mouth., Disp: , Rfl:  .  denosumab (PROLIA) 60 MG/ML SOLN injection, Inject 60 mg into the skin every 6 (six) months. Administer in upper arm, thigh, or abdomen, Disp: , Rfl:  .  lisinopril (PRINIVIL,ZESTRIL) 10 MG tablet, Take 1 tablet (10 mg total) by mouth daily., Disp: 90 tablet, Rfl: 3 .  oxybutynin (DITROPAN XL) 15 MG 24 hr tablet, TAKE 1 TABLET BY MOUTH EVERY 24 HOURS DAILY, Disp: , Rfl:  .  rosuvastatin (CRESTOR) 20 MG tablet, Take 1 tablet (20 mg total) by mouth daily., Disp: 90 tablet, Rfl: 3 .  VITAMIN D PO, Take 2,000 Units by mouth daily., Disp: , Rfl:   Current Facility-Administered Medications:  .  0.9 %  sodium chloride infusion, 500 mL, Intravenous, Once, Danis, Estill Cotta III, MD  EXAM:  Vitals:   07/22/18 1539  BP: 128/90  Pulse: 90  Temp: 98.3 F (36.8 C)    Body mass index is 30.56 kg/m.  GENERAL: vitals reviewed and listed above, alert, oriented, appears well hydrated and in no acute distress  HEENT: atraumatic, conjunttiva clear, no obvious abnormalities on inspection of external nose and ears, normal appearance of ear canals and TMs, clear nasal congestion, mild post oropharyngeal erythema with PND, no tonsillar edema or exudate, no sinus TTP  NECK: no obvious masses on inspection  LUNGS: clear to auscultation bilaterally, no wheezes, rales or rhonchi, good air movement  CV: HRRR, no peripheral edema  MS: moves all extremities without noticeable abnormality  PSYCH: pleasant and cooperative, no obvious depression or anxiety  ASSESSMENT AND PLAN:  Discussed the following assessment and plan:  Nasal congestion  Diarrhea, unspecified type  -discusse potential etiologies, options for evaluation, implications, treatment, complications of current issues -discussed most common causes chronic nasal congestion and opted for allergy regimen with antihistamine  and INS with ENT eval as planned, follow up here in interim as needed -imodium, oral hydration, work note, no dairy for the diarrhea with return precuations -of course, we advised to return or notify a doctor immediately if symptoms worsen or persist or new concerns arise.    Patient Instructions  Before you leave: Recheck bp if elevated follow up 1 month Work note: do not return to work until no diarrhea,vomiting or fever for 2-3 days.  Flonase  2 sprays each nostril daily until you see the Ear, nose and throat doctor in a few weeks.  Zyrtec 1/2 to 1 tablet nightly.  For the diarrhea: -imodium as needed -plenty of fluids and bland diet -NO dairy for 2 weeks  I hope you are feeling better soon! Seek care promptly if your symptoms worsen, new concerns arise or you are not improving with treatment.   Norovirus Infection  Norovirus infection causes inflammation in the stomach and intestines (gastroenteritis) and food poisoning. It is caused by exposure to a virus in a group of similar viruses called noroviruses. Norovirus spreads very easily from person to person (is very contagious). It often occurs in places where people are in close contact, such as schools, nursing homes, and cruise ships. You can get it from food, water, surfaces, or other people who have the virus (are contaminated). Norovirus is also found in the stool (feces) or vomit of infected people. You can spread the infection as soon as you feel sick, and you may continue to be contagious after you recover. What are the causes? This condition is caused by contact with norovirus. You can catch norovirus if you:  Eat or drink something that is contaminated with norovirus.  Touch surfaces or objects that are contaminated with norovirus and then put your hand in or by your mouth or nose.  Have direct contact with an infected person who has symptoms.  Share food, drink, or utensils with someone who is sick with  norovirus. What are the signs or symptoms? Symptoms usually begin within 12 hours to 2 days after you become infected. Most norovirus symptoms affect the digestive system.Symptoms may include:  Nausea.  Vomiting.  Diarrhea.  Stomach cramps.  Fever.  Chills.  Headache.  Muscle aches.  Tiredness. How is this diagnosed? This condition may be diagnosed based on:  Your symptoms.  A physical exam.  A stool test. How is this treated? There is no specific treatment for norovirus. Most people get better without treatment in about 2 days. Young children, the elderly, and people who are already sick may take up to 6 days to recover. Follow these instructions at home: Eating and drinking  Drink plenty of water to replace fluids that are lost through diarrhea and vomiting. This prevents dehydration. Drink enough fluid to keep your urine clear or pale yellow.  Drink clear fluids in small amounts as you are able. Clear fluids include water, ice chips, fruit juice with water added (diluted fruit juice), and low-calorie sports drinks. ? Avoid fluids that contain a lot of sugar or caffeine, such as energy drinks, sports drinks, and soda. ? Avoid alcohol.  If instructed by your health care provider, drink an oral rehydration solution (ORS). This is a drink that is sold at pharmacies and retail stores. An ORS contains minerals (electrolytes) that you can lose through diarrhea and vomiting.  Eat bland, easy-to-digest foods in small amounts as you are able. These foods include bananas, applesauce, rice, lean meats, toast, and crackers. ? Avoid spicy or fatty foods. General instructions   Rest at home while you recover.  Do not prepare food for others while you are infected. Wait at least 3 days after you recover from the illness to do this.  Take over-the-counter and prescription medicines only as told by your health care provider.  Wash your hands frequently with soap and water. If  soap and water are not available, use hand sanitizer.  Make sure that all people in your  household wash their hands well and often.  Keep all follow-up visits as told by your health care provider. This is important. How is this prevented? To help prevent the spread of norovirus:  Stay at home if you are feeling sick. This will reduce the risk of spreading the virus to others.  Wash your hands often with soap and water for at least 20 seconds, especially after using the toilet or changing a diaper.  Wash fruits and vegetables thoroughly before peeling, preparing, or serving them.  Throw out any food that a sick person may have touched.  Disinfect contaminated surfaces immediately after someone in the household has been sick. Use a bleach-based household cleaner.  Immediately remove and wash soiled clothes or sheets. Contact a health care provider if:  You have vomiting, diarrhea, or stomach pain that gets worse.  You have symptoms that do not go away after 2-6 days.  You have a fever.  You cannot drink without vomiting.  You feel light-headed or dizzy.  Your symptoms get worse. Get help right away if: You develop symptoms of dehydration that do not improve with fluid replacement, such as:  Excessive sleepiness.  Lack of tears.  Very little urine production.  Dry mouth.  Muscle cramps.  Weak pulse.  Confusion. Summary  Norovirus infection is common and often occurs in places where people are in close contact, such as schools, nursing homes, and cruise ships.  To help prevent the spread of this infection, wash hands with soap and water for at least 20 seconds before handling food or after having contact with stool or body fluids.  There is no specific treatment for norovirus, but most people get better without treatment in about 2 days. People who are healthy when infected often recover sooner than those who are elderly, young, or already sick.  Replace lost  fluids by drinking plenty of water, or by drinking oral rehydration solution (ORS), which contains important minerals called electrolytes. This prevents dehydration. This information is not intended to replace advice given to you by your health care provider. Make sure you discuss any questions you have with your health care provider. Document Released: 07/28/2002 Document Revised: 06/13/2016 Document Reviewed: 06/13/2016 Elsevier Interactive Patient Education  2019 [place], DO

## 2018-07-22 NOTE — Patient Instructions (Addendum)
Before you leave: Recheck bp if elevated follow up 1 month Work note: do not return to work until no diarrhea,vomiting or fever for 2-3 days.  Flonase 2 sprays each nostril daily until you see the Ear, nose and throat doctor in a few weeks.  Zyrtec 1/2 to 1 tablet nightly.  For the diarrhea: -imodium as needed -plenty of fluids and bland diet -NO dairy for 2 weeks  I hope you are feeling better soon! Seek care promptly if your symptoms worsen, new concerns arise or you are not improving with treatment.   Norovirus Infection  Norovirus infection causes inflammation in the stomach and intestines (gastroenteritis) and food poisoning. It is caused by exposure to a virus in a group of similar viruses called noroviruses. Norovirus spreads very easily from person to person (is very contagious). It often occurs in places where people are in close contact, such as schools, nursing homes, and cruise ships. You can get it from food, water, surfaces, or other people who have the virus (are contaminated). Norovirus is also found in the stool (feces) or vomit of infected people. You can spread the infection as soon as you feel sick, and you may continue to be contagious after you recover. What are the causes? This condition is caused by contact with norovirus. You can catch norovirus if you:  Eat or drink something that is contaminated with norovirus.  Touch surfaces or objects that are contaminated with norovirus and then put your hand in or by your mouth or nose.  Have direct contact with an infected person who has symptoms.  Share food, drink, or utensils with someone who is sick with norovirus. What are the signs or symptoms? Symptoms usually begin within 12 hours to 2 days after you become infected. Most norovirus symptoms affect the digestive system.Symptoms may include:  Nausea.  Vomiting.  Diarrhea.  Stomach cramps.  Fever.  Chills.  Headache.  Muscle  aches.  Tiredness. How is this diagnosed? This condition may be diagnosed based on:  Your symptoms.  A physical exam.  A stool test. How is this treated? There is no specific treatment for norovirus. Most people get better without treatment in about 2 days. Young children, the elderly, and people who are already sick may take up to 6 days to recover. Follow these instructions at home: Eating and drinking  Drink plenty of water to replace fluids that are lost through diarrhea and vomiting. This prevents dehydration. Drink enough fluid to keep your urine clear or pale yellow.  Drink clear fluids in small amounts as you are able. Clear fluids include water, ice chips, fruit juice with water added (diluted fruit juice), and low-calorie sports drinks. ? Avoid fluids that contain a lot of sugar or caffeine, such as energy drinks, sports drinks, and soda. ? Avoid alcohol.  If instructed by your health care provider, drink an oral rehydration solution (ORS). This is a drink that is sold at pharmacies and retail stores. An ORS contains minerals (electrolytes) that you can lose through diarrhea and vomiting.  Eat bland, easy-to-digest foods in small amounts as you are able. These foods include bananas, applesauce, rice, lean meats, toast, and crackers. ? Avoid spicy or fatty foods. General instructions   Rest at home while you recover.  Do not prepare food for others while you are infected. Wait at least 3 days after you recover from the illness to do this.  Take over-the-counter and prescription medicines only as told by your health care provider.  Wash your hands frequently with soap and water. If soap and water are not available, use hand sanitizer.  Make sure that all people in your household wash their hands well and often.  Keep all follow-up visits as told by your health care provider. This is important. How is this prevented? To help prevent the spread of norovirus:  Stay at  home if you are feeling sick. This will reduce the risk of spreading the virus to others.  Wash your hands often with soap and water for at least 20 seconds, especially after using the toilet or changing a diaper.  Wash fruits and vegetables thoroughly before peeling, preparing, or serving them.  Throw out any food that a sick person may have touched.  Disinfect contaminated surfaces immediately after someone in the household has been sick. Use a bleach-based household cleaner.  Immediately remove and wash soiled clothes or sheets. Contact a health care provider if:  You have vomiting, diarrhea, or stomach pain that gets worse.  You have symptoms that do not go away after 2-6 days.  You have a fever.  You cannot drink without vomiting.  You feel light-headed or dizzy.  Your symptoms get worse. Get help right away if: You develop symptoms of dehydration that do not improve with fluid replacement, such as:  Excessive sleepiness.  Lack of tears.  Very little urine production.  Dry mouth.  Muscle cramps.  Weak pulse.  Confusion. Summary  Norovirus infection is common and often occurs in places where people are in close contact, such as schools, nursing homes, and cruise ships.  To help prevent the spread of this infection, wash hands with soap and water for at least 20 seconds before handling food or after having contact with stool or body fluids.  There is no specific treatment for norovirus, but most people get better without treatment in about 2 days. People who are healthy when infected often recover sooner than those who are elderly, young, or already sick.  Replace lost fluids by drinking plenty of water, or by drinking oral rehydration solution (ORS), which contains important minerals called electrolytes. This prevents dehydration. This information is not intended to replace advice given to you by your health care provider. Make sure you discuss any questions you  have with your health care provider. Document Released: 07/28/2002 Document Revised: 06/13/2016 Document Reviewed: 06/13/2016 Elsevier Interactive Patient Education  2019 [employer].

## 2018-07-24 ENCOUNTER — Other Ambulatory Visit: Payer: Self-pay | Admitting: Family Medicine

## 2018-07-24 MED FILL — LISINOPRIL 10 MG TABS: 10 | 90 days supply | Qty: 90 | Fill #0

## 2018-08-04 MED FILL — PROLIA 60 MG/ML SOLN: 60 | 30 days supply | Qty: 1 | Fill #0

## 2018-08-05 MED FILL — OXYBUTYNIN CL ER 15 MG TAB: 15 | 90 days supply | Qty: 90 | Fill #1

## 2018-08-06 DIAGNOSIS — M810 Age-related osteoporosis without current pathological fracture: Secondary | ICD-10-CM | POA: Diagnosis not present

## 2018-09-09 ENCOUNTER — Other Ambulatory Visit: Payer: Self-pay | Admitting: Family Medicine

## 2018-09-11 ENCOUNTER — Other Ambulatory Visit: Payer: Self-pay | Admitting: Family Medicine

## 2018-09-11 MED FILL — ROSUVASTATIN CALCIUM 20 MG: 20 | 90 days supply | Qty: 90 | Fill #0

## 2018-09-11 NOTE — Telephone Encounter (Signed)
Patient called to check staus

## 2018-11-03 DIAGNOSIS — H2512 Age-related nuclear cataract, left eye: Secondary | ICD-10-CM | POA: Diagnosis not present

## 2018-11-03 DIAGNOSIS — H35371 Puckering of macula, right eye: Secondary | ICD-10-CM | POA: Diagnosis not present

## 2018-11-04 ENCOUNTER — Emergency Department (HOSPITAL_BASED_OUTPATIENT_CLINIC_OR_DEPARTMENT_OTHER)
Admission: EM | Admit: 2018-11-04 | Discharge: 2018-11-04 | Disposition: A | Discharge status: Home / Self Care | Payer: 59 | Attending: Emergency Medicine | Admitting: Emergency Medicine

## 2018-11-04 ENCOUNTER — Encounter (HOSPITAL_BASED_OUTPATIENT_CLINIC_OR_DEPARTMENT_OTHER): Payer: Self-pay | Admitting: *Deleted

## 2018-11-04 ENCOUNTER — Other Ambulatory Visit: Payer: Self-pay

## 2018-11-04 DIAGNOSIS — I10 Essential (primary) hypertension: Secondary | ICD-10-CM | POA: Diagnosis not present

## 2018-11-04 DIAGNOSIS — Z79899 Other long term (current) drug therapy: Secondary | ICD-10-CM | POA: Insufficient documentation

## 2018-11-04 NOTE — ED Notes (Signed)
Pt states she was just concerned about her B/P. Denies any symptoms. Denies CP, ShOB, blurred vision, or HA.

## 2018-11-04 NOTE — ED Triage Notes (Signed)
States she had her BP checked at the eye doctor yesterday. Her BP has been elevated all day today. States she has gotten herself worked up. No pain.

## 2018-11-04 NOTE — Discharge Instructions (Signed)
Keep a log of your blood pressure until you can follow-up with your PCP. Otherwise take lisinopril as prescribed.

## 2018-11-05 ENCOUNTER — Telehealth: Payer: Self-pay | Admitting: *Deleted

## 2018-11-05 NOTE — Telephone Encounter (Signed)
Copied from [place] [id number]. Topic: General - Other >> Nov 04, 2018  4:18 PM Mcneil, [name] wrote: Reason for CRM: Pt stated she has been experiencing high blood pressure readings and she would like to schedule an appt.Pt requests call back  Clinic RN attempted to call patient to schedule an appointment. No answer. LVM for patient to return call.

## 2018-11-05 NOTE — ED Provider Notes (Signed)
[facility] EMERGENCY DEPARTMENT Provider Note   CSN: [id number] Arrival date & time: 11/04/18  1832     History   Chief Complaint Chief Complaint  Patient presents with  . Hypertension  . Palpitations    HPI [name] is a 63 y.o. female.     HPI   63yF with HTN. Hx of the same. Reports had BP taken at eye appointment yesterday and high. 150s/90s. Took BP today and kept climbing from this range into 009Q systolic and she became anxious/concerned so came to the ED. Denies any pain or dyspnea. Feels like her heart is beating fast.   Past Medical History:  Diagnosis Date  . B12 DEFICIENCY 02/09/2009   Qualifier: Diagnosis of  By: Ronnald Ramp RN, CGRN, Sheri    . Blood in stool   . COLONIC POLYPS, HYPERPLASTIC, HX OF 02/03/2009   Qualifier: Diagnosis of  By: Surface RN, Butch Penny    . DIVERTICULOSIS, COLON 02/03/2009   Qualifier: Diagnosis of  By: Varney Daily RN, Butch Penny    . Dyslipidemia 06/20/2012  . Fracture, intertrochanteric, left femur ([place]) 05/02/2012  . GERD (gastroesophageal reflux disease)   . Hearing loss    Bil/has hearing aids  . Hemorrhoids   . HEMORRHOIDS 02/03/2009   Qualifier: Diagnosis of  By: Varney Daily RN, Butch Penny    . Hypertension   . INFLAMMATORY BOWEL DISEASE - Followed by Dr. Sharlett Iles in GI 02/03/2009   Qualifier: Diagnosis of  By: Varney Daily RN, Butch Penny    . Osteopenia   . Turner's syndrome    was on Provera and Premarin and d/c this  at age 27yr  . Ulcerative colitis     Patient Active Problem List   Diagnosis Date Noted  . Osteopenia/?Osteoporosis - followed by her gynecologist, Dr. Pamala Hurry 06/20/2012  . Dyslipidemia 06/20/2012  . H/O Turner syndrome 05/02/2012  . Essential hypertension 02/03/2009  . GERD 02/03/2009  . INFLAMMATORY BOWEL DISEASE - Followed by GI 02/03/2009  . COLONIC POLYPS, HYPERPLASTIC, HX OF 02/03/2009    Past Surgical History:  Procedure Laterality Date  . COLONOSCOPY  last 03/25/2013  . FRACTURE SURGERY  2004   fx lt  thumb  . HERNIA REPAIR     lt ing   . INTRAMEDULLARY (IM) NAIL INTERTROCHANTERIC  05/02/2012   Procedure: INTRAMEDULLARY (IM) NAIL INTERTROCHANTRIC;  Surgeon: Johnny Bridge, MD;  Location: [facility];  Service: Orthopedics;  Laterality: Left;  . ORIF FINGER FRACTURE  06/14/2011   Procedure: OPEN REDUCTION INTERNAL FIXATION (ORIF) METACARPAL (FINGER) FRACTURE;  Surgeon: Tennis Must, MD;  Location: [facility];  Service: Orthopedics;  Laterality: Right;  right ring  . POLYPECTOMY    . TONSILLECTOMY    . UPPER GASTROINTESTINAL ENDOSCOPY       OB History   No obstetric history on file.      Home Medications    Prior to Admission medications   Medication Sig Start Date End Date Taking? Authorizing Provider  Calcium Carbonate Antacid (CALCIUM CARBONATE PO) Take by mouth.    [provider]  denosumab (PROLIA) 60 MG/ML SOLN injection Inject 60 mg into the skin every 6 (six) months. Administer in upper arm, thigh, or abdomen    [provider]  lisinopril (PRINIVIL,ZESTRIL) 10 MG tablet TAKE 1 TABLET (10 MG TOTAL) BY MOUTH DAILY. 07/24/18   Lucretia Kern, DO  oxybutynin (DITROPAN XL) 15 MG 24 hr tablet TAKE 1 TABLET BY MOUTH EVERY 24 HOURS DAILY 11/11/17   [provider]  rosuvastatin (CRESTOR) 20 MG tablet TAKE 1 TABLET (20 MG TOTAL) BY MOUTH DAILY. 09/11/18   Lucretia Kern, DO  VITAMIN D PO Take 2,000 Units by mouth daily.    [provider]    Family History Family History  Problem Relation Age of Onset  . Lymphoma Mother        nhl-mantle cell  . Lymphoma Father        nhl  . Colon cancer Neg Hx   . Esophageal cancer Neg Hx   . Stomach cancer Neg Hx   . Rectal cancer Neg Hx   . Breast cancer Neg Hx   . Colon polyps Neg Hx     Social History Social History   Tobacco Use  . Smoking status: Never Smoker  . Smokeless tobacco: Never Used  Substance Use Topics  . Alcohol use: Yes    Alcohol/week: 1.0 standard drinks    Types: 1  Glasses of wine per week    Comment: occ.  . Drug use: No     Allergies   Patient has no known allergies.   Review of Systems Review of Systems  All systems reviewed and negative, other than as noted in HPI.  Physical Exam Updated Vital Signs BP (!) 152/84   Pulse 99   Temp 99 F (37.2 C) (Oral)   Resp 16   Ht 4\' 8"  (1.422 m)   Wt 65.6 kg   SpO2 96%   BMI 32.44 kg/m   Physical Exam Vitals signs and nursing note reviewed.  Constitutional:      General: She is not in acute distress.    Appearance: She is well-developed.  HENT:     Head: Normocephalic and atraumatic.  Eyes:     General:        Right eye: No discharge.        Left eye: No discharge.     Conjunctiva/sclera: Conjunctivae normal.  Neck:     Musculoskeletal: Neck supple.  Cardiovascular:     Rate and Rhythm: Regular rhythm. Tachycardia present.     Heart sounds: Normal heart sounds. No murmur. No friction rub. No gallop.   Pulmonary:     Effort: Pulmonary effort is normal. No respiratory distress.     Breath sounds: Normal breath sounds.  Abdominal:     General: There is no distension.     Palpations: Abdomen is soft.     Tenderness: There is no abdominal tenderness.  Musculoskeletal:        General: No tenderness.     Comments: Lower extremities symmetric as compared to each other. No calf tenderness. Negative Homan's. No palpable cords.   Skin:    General: Skin is warm and dry.  Neurological:     Mental Status: She is alert.  Psychiatric:        Behavior: Behavior normal.        Thought Content: Thought content normal.      ED Treatments / Results  Labs (all labs ordered are listed, but only abnormal results are displayed) Labs Reviewed - No data to display  EKG EKG Interpretation  Date/Time:  Tuesday November 04 2018 18:56:17 EDT Ventricular Rate:  126 PR Interval:  118 QRS Duration: 80 QT Interval:  334 QTC Calculation: 483 R Axis:   166 Text Interpretation:  Sinus tachycardia  Right axis deviation Pulmonary disease pattern Right ventricular hypertrophy Nonspecific ST abnormality Abnormal ECG Confirmed by Virgel Manifold [phone]) on 11/04/2018 7:42:05 PM   Radiology  No results found.  Procedures Procedures (including critical care time)  Medications Ordered in ED Medications - No data to display   Initial Impression / Assessment and Plan / ED Course  I have reviewed the triage vital signs and the nursing notes.  Pertinent labs & imaging results that were available during my care of the patient were reviewed by me and considered in my medical decision making (see chart for details).    63yF with HTN. She is largely asymptomatic though. HR is elevated but I suspect this may be more anxiety related than anything else. Advised to keep a log of her BP and take it with her to her next appointment with her PCP.   Final Clinical Impressions(s) / ED Diagnoses   Final diagnoses:  Hypertension, unspecified type    ED Discharge Orders    None       Virgel Manifold, MD 11/05/18 1026

## 2018-11-06 ENCOUNTER — Other Ambulatory Visit: Payer: Self-pay

## 2018-11-06 ENCOUNTER — Encounter: Payer: Self-pay | Admitting: Family Medicine

## 2018-11-06 ENCOUNTER — Ambulatory Visit (INDEPENDENT_AMBULATORY_CARE_PROVIDER_SITE_OTHER): Payer: 59 | Admitting: Family Medicine

## 2018-11-06 VITALS — BP 143/70

## 2018-11-06 DIAGNOSIS — E669 Obesity, unspecified: Secondary | ICD-10-CM | POA: Diagnosis not present

## 2018-11-06 DIAGNOSIS — I10 Essential (primary) hypertension: Secondary | ICD-10-CM

## 2018-11-06 DIAGNOSIS — E785 Hyperlipidemia, unspecified: Secondary | ICD-10-CM

## 2018-11-06 MED ORDER — LISINOPRIL 20 MG PO TABS: 20.0000 mg | ORAL_TABLET | Freq: Every day | ORAL | 0 refills | Status: DC

## 2018-11-06 MED FILL — LISINOPRIL 20 MG TABLET: 20 | 90 days supply | Qty: 90 | Fill #0

## 2018-11-06 NOTE — Progress Notes (Signed)
Virtual Visit via Video Note  I connected with [name]  on 11/06/18 at  5:00 PM EDT by a video enabled telemedicine application and verified that I am speaking with the correct person using two identifiers.  Location patient: home Location provider:work or home office Persons participating in the virtual visit: patient, provider  I discussed the limitations of evaluation and management by telemedicine and the availability of in person appointments. The patient expressed understanding and agreed to proceed.   HPI:  Acute visit for Elevated BP: BMP hypertension on low dose lisinopril Has had increased stress lately and gained 15-20lbs over the last 3 months during the pandemic due to self reported poor diet, is just now starting back on weight watchers Bp 178/94 at eye doctor last Thursday, this worried her and she started checking her blood pressure frequently BP check at work 150-190/90 Martin Majestic to the emergency department 6/16 for this and reports BP was 170/90, 152/84 Yesterday 146/88 Today is 143/70, now 174/96 -reports feels ok -no no cp, sob, doe, weakness, numbness, neuro deficits -very mild headache at times, none now -has been walking and feels ok when exercises, no DOE or chest pain    ROS: See pertinent positives and negatives per HPI.  Past Medical History:  Diagnosis Date  . B12 DEFICIENCY 02/09/2009   Qualifier: Diagnosis of  By: Ronnald Ramp RN, CGRN, Sheri    . Blood in stool   . COLONIC POLYPS, HYPERPLASTIC, HX OF 02/03/2009   Qualifier: Diagnosis of  By: Surface RN, Butch Penny    . DIVERTICULOSIS, COLON 02/03/2009   Qualifier: Diagnosis of  By: Varney Daily RN, Butch Penny    . Dyslipidemia 06/20/2012  . Fracture, intertrochanteric, left femur ([place]) 05/02/2012  . GERD (gastroesophageal reflux disease)   . Hearing loss    Bil/has hearing aids  . Hemorrhoids   . HEMORRHOIDS 02/03/2009   Qualifier: Diagnosis of  By: Varney Daily RN, Butch Penny    . Hypertension   . INFLAMMATORY BOWEL DISEASE -  Followed by Dr. Sharlett Iles in GI 02/03/2009   Qualifier: Diagnosis of  By: Varney Daily RN, Butch Penny    . Osteopenia   . Turner's syndrome    was on Provera and Premarin and d/c this  at age 53yr  . Ulcerative colitis     Past Surgical History:  Procedure Laterality Date  . COLONOSCOPY  last 03/25/2013  . FRACTURE SURGERY  2004   fx lt thumb  . HERNIA REPAIR     lt ing   . INTRAMEDULLARY (IM) NAIL INTERTROCHANTERIC  05/02/2012   Procedure: INTRAMEDULLARY (IM) NAIL INTERTROCHANTRIC;  Surgeon: Johnny Bridge, MD;  Location: [facility];  Service: Orthopedics;  Laterality: Left;  . ORIF FINGER FRACTURE  06/14/2011   Procedure: OPEN REDUCTION INTERNAL FIXATION (ORIF) METACARPAL (FINGER) FRACTURE;  Surgeon: Tennis Must, MD;  Location: [facility];  Service: Orthopedics;  Laterality: Right;  right ring  . POLYPECTOMY    . TONSILLECTOMY    . UPPER GASTROINTESTINAL ENDOSCOPY      Family History  Problem Relation Age of Onset  . Lymphoma Mother        nhl-mantle cell  . Lymphoma Father        nhl  . Colon cancer Neg Hx   . Esophageal cancer Neg Hx   . Stomach cancer Neg Hx   . Rectal cancer Neg Hx   . Breast cancer Neg Hx   . Colon polyps Neg Hx     SOCIAL HX: see hpi   Current  Outpatient Medications:  .  Calcium Carbonate Antacid (CALCIUM CARBONATE PO), Take by mouth., Disp: , Rfl:  .  denosumab (PROLIA) 60 MG/ML SOLN injection, Inject 60 mg into the skin every 6 (six) months. Administer in upper arm, thigh, or abdomen, Disp: , Rfl:  .  lisinopril (ZESTRIL) 20 MG tablet, Take 1 tablet (20 mg total) by mouth daily., Disp: 90 tablet, Rfl: 0 .  oxybutynin (DITROPAN XL) 15 MG 24 hr tablet, TAKE 1 TABLET BY MOUTH EVERY 24 HOURS DAILY, Disp: , Rfl:  .  rosuvastatin (CRESTOR) 20 MG tablet, TAKE 1 TABLET (20 MG TOTAL) BY MOUTH DAILY., Disp: 90 tablet, Rfl: 0 .  VITAMIN D PO, Take 2,000 Units by mouth daily., Disp: , Rfl:   Current Facility-Administered Medications:  .  0.9 %  sodium  chloride infusion, 500 mL, Intravenous, Once, Danis, Kirke Corin, MD  EXAM:  VITALS per patient if applicable: Today is 143/70 on check in with my nurse , now 174/96 GENERAL: alert, oriented, appears well and in no acute distress  HEENT: atraumatic, conjunttiva clear, no obvious abnormalities on inspection of external nose and ears  NECK: normal movements of the head and neck  LUNGS: on inspection no signs of respiratory distress, breathing rate appears normal, no obvious gross SOB, gasping or wheezing  CV: no obvious cyanosis  MS: moves all visible extremities without noticeable abnormality  PSYCH/NEURO: pleasant and cooperative, no obvious depression, anxious, speech and thought processing grossly intact  ASSESSMENT AND PLAN:  Discussed the following assessment and plan:  Essential hypertension  Dyslipidemia   Obesity (BMI 30-39.9)   -fasting labs -increase lisinopril to 20mg  daily -healthy diet and exercise with goal to reduce weight by 10-15lbs in next 3 months -follow up 2 week, sooner if any concerns   I discussed the assessment and treatment plan with the patient. The patient was provided an opportunity to ask questions and all were answered. The patient agreed with the plan and demonstrated an understanding of the instructions.   The patient was advised to call back or seek an in-person evaluation if the symptoms worsen or if the condition fails to improve as anticipated.   Follow up instructions: Advised assistant Wendie Simmer to help patient arrange the following: -lab appointment in next 1 week for fasting labs -follow up Dr. Maudie Mercury 2 weeks   Lucretia Kern, DO

## 2018-11-06 NOTE — Patient Instructions (Signed)
Please increase the lisinopril to 20mg  daily.  Stick to the [employer] with a goal of 10-15 lb weight reduction over the next 3 months to help the blood pressure.  Labs next week, call if our lab does not contact you by Monday to set up.  Follow up with Dr. Maudie Mercury in 2 weeks.  I hope you are feeling better soon! Seek care sooner if your symptoms worsen, new concerns arise or you are not improving with treatment.

## 2018-11-07 ENCOUNTER — Telehealth: Payer: Self-pay | Admitting: *Deleted

## 2018-11-07 NOTE — Telephone Encounter (Signed)
I left a detailed message for the pt to call and schedule the appts as below.

## 2018-11-07 NOTE — Telephone Encounter (Signed)
-----   Message from Lucretia Kern, DO sent at 11/06/2018  7:04 PM EDT ----- -lab visit in next 1 week fasting-follow up with Dr. Maudie Mercury in 2 weeks

## 2018-11-12 ENCOUNTER — Telehealth: Payer: Self-pay | Admitting: *Deleted

## 2018-11-12 ENCOUNTER — Other Ambulatory Visit (INDEPENDENT_AMBULATORY_CARE_PROVIDER_SITE_OTHER): Payer: 59

## 2018-11-12 ENCOUNTER — Other Ambulatory Visit: Payer: Self-pay

## 2018-11-12 DIAGNOSIS — E785 Hyperlipidemia, unspecified: Secondary | ICD-10-CM | POA: Diagnosis not present

## 2018-11-12 DIAGNOSIS — I10 Essential (primary) hypertension: Secondary | ICD-10-CM | POA: Diagnosis not present

## 2018-11-12 LAB — CBC WITH DIFFERENTIAL/PLATELET
Basophils Absolute: 0 10*3/uL (ref 0.0–0.1)
Basophils Relative: 0.5 % (ref 0.0–3.0)
Eosinophils Absolute: 0.1 10*3/uL (ref 0.0–0.7)
Eosinophils Relative: 1.4 % (ref 0.0–5.0)
HCT: 44.5 % (ref 36.0–46.0)
Hemoglobin: 14.9 g/dL (ref 12.0–15.0)
Lymphocytes Relative: 46.4 % — ABNORMAL HIGH (ref 12.0–46.0)
Lymphs Abs: 1.9 10*3/uL (ref 0.7–4.0)
MCHC: 33.5 g/dL (ref 30.0–36.0)
MCV: 93.4 fl (ref 78.0–100.0)
Monocytes Absolute: 0.3 10*3/uL (ref 0.1–1.0)
Monocytes Relative: 8.1 % (ref 3.0–12.0)
Neutro Abs: 1.7 10*3/uL (ref 1.4–7.7)
Neutrophils Relative %: 43.6 % (ref 43.0–77.0)
Platelets: 180 10*3/uL (ref 150.0–400.0)
RBC: 4.76 Mil/uL (ref 3.87–5.11)
RDW: 12.9 % (ref 11.5–15.5)
WBC: 4 10*3/uL (ref 4.0–10.5)

## 2018-11-12 LAB — BASIC METABOLIC PANEL
BUN: 10 mg/dL (ref 6–23)
CO2: 28 mEq/L (ref 19–32)
Calcium: 9.3 mg/dL (ref 8.4–10.5)
Chloride: 101 mEq/L (ref 96–112)
Creatinine, Ser: 0.65 mg/dL (ref 0.40–1.20)
GFR: 92.26 mL/min (ref >60.00)
Glucose, Bld: 82 mg/dL (ref 70–99)
Potassium: 4.1 mEq/L (ref 3.5–5.1)
Sodium: 138 mEq/L (ref 135–145)

## 2018-11-12 LAB — LIPID PANEL
Cholesterol: 163 mg/dL (ref 0–200)
HDL: 57.1 mg/dL (ref >39.00)
LDL Cholesterol: 92 mg/dL (ref 0–99)
NonHDL: 105.83
Total CHOL/HDL Ratio: 3
Triglycerides: 69 mg/dL (ref 0.0–149.0)
VLDL: 13.8 mg/dL (ref 0.0–40.0)

## 2018-11-12 NOTE — Telephone Encounter (Signed)
Per [name] the pt is coming in today for labs and there are no orders in the chart.  [name] reviewed the pts chart and advised to order a CBC, BMP and lipid panel.  Orders entered and [name] was informed.

## 2018-11-18 DIAGNOSIS — H25043 Posterior subcapsular polar age-related cataract, bilateral: Secondary | ICD-10-CM | POA: Diagnosis not present

## 2018-11-18 DIAGNOSIS — H18413 Arcus senilis, bilateral: Secondary | ICD-10-CM | POA: Diagnosis not present

## 2018-11-18 DIAGNOSIS — H25013 Cortical age-related cataract, bilateral: Secondary | ICD-10-CM | POA: Diagnosis not present

## 2018-11-18 DIAGNOSIS — H2513 Age-related nuclear cataract, bilateral: Secondary | ICD-10-CM | POA: Diagnosis not present

## 2018-11-18 DIAGNOSIS — H2511 Age-related nuclear cataract, right eye: Secondary | ICD-10-CM | POA: Diagnosis not present

## 2018-11-18 MED FILL — PROLENSA 0.07% EYE DROPS: 0.07 | 60 days supply | Qty: 3 | Fill #0

## 2018-11-18 MED FILL — BESIVANCE 0.6% SUSP: 0.6 | 25 days supply | Qty: 5 | Fill #0

## 2018-11-20 ENCOUNTER — Encounter: Payer: Self-pay | Admitting: Family Medicine

## 2018-11-20 ENCOUNTER — Ambulatory Visit (INDEPENDENT_AMBULATORY_CARE_PROVIDER_SITE_OTHER): Payer: 59 | Admitting: Family Medicine

## 2018-11-20 ENCOUNTER — Other Ambulatory Visit: Payer: Self-pay

## 2018-11-20 VITALS — BP 133/61

## 2018-11-20 DIAGNOSIS — I10 Essential (primary) hypertension: Secondary | ICD-10-CM | POA: Diagnosis not present

## 2018-11-20 MED FILL — DUREZOL 0.05% EYE DROPS: 0.05 | 25 days supply | Qty: 5 | Fill #0

## 2018-11-20 NOTE — Progress Notes (Signed)
Virtual Visit via Video Note  I connected with [name]  on 11/20/18 at  5:00 PM EDT by a video enabled telemedicine application and verified that I am speaking with the correct person using two identifiers.  Location patient: home Location provider:work or home office Persons participating in the virtual visit: patient, provider  I discussed the limitations of evaluation and management by telemedicine and the availability of in person appointments. The patient expressed understanding and agreed to proceed.   HPI:  Follow up Hypertension: -reports has been much better -1 teens-130/60-70s -today 133/61 today at the hospital/work -average in 120s/70s -taking lisinopril 20mg  -labs looked pre -no CP, SOB, HA, vision changes  ROS: See pertinent positives and negatives per HPI.  Past Medical History:  Diagnosis Date  . B12 DEFICIENCY 02/09/2009   Qualifier: Diagnosis of  By: Ronnald Ramp RN, CGRN, Sheri    . Blood in stool   . COLONIC POLYPS, HYPERPLASTIC, HX OF 02/03/2009   Qualifier: Diagnosis of  By: Surface RN, Butch Penny    . DIVERTICULOSIS, COLON 02/03/2009   Qualifier: Diagnosis of  By: Varney Daily RN, Butch Penny    . Dyslipidemia 06/20/2012  . Fracture, intertrochanteric, left femur ([place]) 05/02/2012  . GERD (gastroesophageal reflux disease)   . Hearing loss    Bil/has hearing aids  . Hemorrhoids   . HEMORRHOIDS 02/03/2009   Qualifier: Diagnosis of  By: Varney Daily RN, Butch Penny    . Hypertension   . INFLAMMATORY BOWEL DISEASE - Followed by Dr. Sharlett Iles in GI 02/03/2009   Qualifier: Diagnosis of  By: Varney Daily RN, Butch Penny    . Osteopenia   . Turner's syndrome    was on Provera and Premarin and d/c this  at age 41yr  . Ulcerative colitis     Past Surgical History:  Procedure Laterality Date  . COLONOSCOPY  last 03/25/2013  . FRACTURE SURGERY  2004   fx lt thumb  . HERNIA REPAIR     lt ing   . INTRAMEDULLARY (IM) NAIL INTERTROCHANTERIC  05/02/2012   Procedure: INTRAMEDULLARY (IM) NAIL  INTERTROCHANTRIC;  Surgeon: Johnny Bridge, MD;  Location: [facility];  Service: Orthopedics;  Laterality: Left;  . ORIF FINGER FRACTURE  06/14/2011   Procedure: OPEN REDUCTION INTERNAL FIXATION (ORIF) METACARPAL (FINGER) FRACTURE;  Surgeon: Tennis Must, MD;  Location: [facility];  Service: Orthopedics;  Laterality: Right;  right ring  . POLYPECTOMY    . TONSILLECTOMY    . UPPER GASTROINTESTINAL ENDOSCOPY      Family History  Problem Relation Age of Onset  . Lymphoma Mother        nhl-mantle cell  . Lymphoma Father        nhl  . Colon cancer Neg Hx   . Esophageal cancer Neg Hx   . Stomach cancer Neg Hx   . Rectal cancer Neg Hx   . Breast cancer Neg Hx   . Colon polyps Neg Hx     SOCIAL HX: see hpi   Current Outpatient Medications:  .  Calcium Carbonate Antacid (CALCIUM CARBONATE PO), Take by mouth., Disp: , Rfl:  .  denosumab (PROLIA) 60 MG/ML SOLN injection, Inject 60 mg into the skin every 6 (six) months. Administer in upper arm, thigh, or abdomen, Disp: , Rfl:  .  lisinopril (ZESTRIL) 20 MG tablet, Take 1 tablet (20 mg total) by mouth daily., Disp: 90 tablet, Rfl: 0 .  oxybutynin (DITROPAN XL) 15 MG 24 hr tablet, TAKE 1 TABLET BY MOUTH EVERY 24 HOURS DAILY, Disp: ,  Rfl:  .  rosuvastatin (CRESTOR) 20 MG tablet, TAKE 1 TABLET (20 MG TOTAL) BY MOUTH DAILY., Disp: 90 tablet, Rfl: 0 .  VITAMIN D PO, Take 2,000 Units by mouth daily., Disp: , Rfl:   Current Facility-Administered Medications:  .  0.9 %  sodium chloride infusion, 500 mL, Intravenous, Once, Danis, Kirke Corin, MD  EXAM:  VITALS per patient if applicable: BP 001/74  GENERAL: alert, oriented, appears well and in no acute distress  HEENT: atraumatic, conjunttiva clear, no obvious abnormalities on inspection of external nose and ears  NECK: normal movements of the head and neck  LUNGS: on inspection no signs of respiratory distress, breathing rate appears normal, no obvious gross SOB, gasping or  wheezing  CV: no obvious cyanosis  MS: moves all visible extremities without noticeable abnormality  PSYCH/NEURO: pleasant and cooperative, no obvious depression or anxiety, speech and thought processing grossly intact  ASSESSMENT AND PLAN:  Discussed the following assessment and plan:  Essential hypertension -  -lifestyle recs, healthy diet and regular exercise -opted to hold the course with current medications -follow up in 3-4 months   I discussed the assessment and treatment plan with the patient. The patient was provided an opportunity to ask questions and all were answered. The patient agreed with the plan and demonstrated an understanding of the instructions.   The patient was advised to call back or seek an in-person evaluation if the symptoms worsen or if the condition fails to improve as anticipated.   Follow up instructions: Advised assistant Wendie Simmer to help patient arrange the following: -follow up HTN, HLD with Dr. Maudie Mercury in 3-4 months -CPE with Dr. Ethlyn Gallery in office if she is comfortable when able  Lucretia Kern, DO

## 2018-11-25 ENCOUNTER — Telehealth: Payer: Self-pay | Admitting: *Deleted

## 2018-11-25 NOTE — Telephone Encounter (Signed)
I left a detailed message at the pts cell number to call and schedule appts as below.  

## 2018-11-25 NOTE — Telephone Encounter (Signed)
-----   Message from Lucretia Kern, DO sent at 11/20/2018  5:21 PM EDT ----- -follow up HTN, HLD with Dr. Maudie Mercury in 3-4 months-CPE with Dr. Ethlyn Gallery in office if she is comfortable when able

## 2018-12-03 ENCOUNTER — Encounter: Payer: Self-pay | Admitting: Family Medicine

## 2018-12-03 ENCOUNTER — Ambulatory Visit (INDEPENDENT_AMBULATORY_CARE_PROVIDER_SITE_OTHER): Payer: 59 | Admitting: Family Medicine

## 2018-12-03 ENCOUNTER — Other Ambulatory Visit: Payer: Self-pay

## 2018-12-03 VITALS — BP 156/92 | HR 108 | Temp 97.9°F | Ht <= 58 in | Wt 141.5 lb

## 2018-12-03 DIAGNOSIS — I10 Essential (primary) hypertension: Secondary | ICD-10-CM

## 2018-12-03 MED ORDER — HYDROCHLOROTHIAZIDE 12.5 MG PO CAPS: 12.5000 mg | ORAL_CAPSULE | Freq: Every day | ORAL | 11 refills | Status: DC

## 2018-12-03 NOTE — Progress Notes (Signed)
Subjective:     Patient ID: [name], female   DOB: [birth year], 63 y.o.   MRN: [id number]  HPI Patient seated for evaluation of hypertension.  She was initially set up for a virtual visit today but she requested an office evaluation.  She has history of hypertension.  This has been controlled with lisinopril and at some point earlier this year dosage was increased to 20 mg.  Blood pressure seems to be doing well until few days ago.  She brings in several readings from her home including 165/87, 151/93, 157/88.  Occasional mild headache.  No clear provoking factors for her blood pressure.  She drinks only occasional wine once or twice a week.  No recent nonsteroidal use.  Compliant with lisinopril.  No recent peripheral edema.  Minimal sodium intake.  Past Medical History:  Diagnosis Date  . B12 DEFICIENCY 02/09/2009   Qualifier: Diagnosis of  By: Ronnald Ramp RN, CGRN, Sheri    . Blood in stool   . COLONIC POLYPS, HYPERPLASTIC, HX OF 02/03/2009   Qualifier: Diagnosis of  By: Surface RN, Butch Penny    . DIVERTICULOSIS, COLON 02/03/2009   Qualifier: Diagnosis of  By: Varney Daily RN, Butch Penny    . Dyslipidemia 06/20/2012  . Fracture, intertrochanteric, left femur ([place]) 05/02/2012  . GERD (gastroesophageal reflux disease)   . Hearing loss    Bil/has hearing aids  . Hemorrhoids   . HEMORRHOIDS 02/03/2009   Qualifier: Diagnosis of  By: Varney Daily RN, Butch Penny    . Hypertension   . INFLAMMATORY BOWEL DISEASE - Followed by Dr. Sharlett Iles in GI 02/03/2009   Qualifier: Diagnosis of  By: Varney Daily RN, Butch Penny    . Osteopenia   . Turner's syndrome    was on Provera and Premarin and d/c this  at age 81yr  . Ulcerative colitis    Past Surgical History:  Procedure Laterality Date  . COLONOSCOPY  last 03/25/2013  . FRACTURE SURGERY  2004   fx lt thumb  . HERNIA REPAIR     lt ing   . INTRAMEDULLARY (IM) NAIL INTERTROCHANTERIC  05/02/2012   Procedure: INTRAMEDULLARY (IM) NAIL INTERTROCHANTRIC;  Surgeon: Johnny Bridge,  MD;  Location: [facility];  Service: Orthopedics;  Laterality: Left;  . ORIF FINGER FRACTURE  06/14/2011   Procedure: OPEN REDUCTION INTERNAL FIXATION (ORIF) METACARPAL (FINGER) FRACTURE;  Surgeon: Tennis Must, MD;  Location: [facility];  Service: Orthopedics;  Laterality: Right;  right ring  . POLYPECTOMY    . TONSILLECTOMY    . UPPER GASTROINTESTINAL ENDOSCOPY      reports that she has never smoked. She has never used smokeless tobacco. She reports current alcohol use of about 1.0 standard drinks of alcohol per week. She reports that she does not use drugs. family history includes Lymphoma in her father and mother. No Known Allergies   Review of Systems  Constitutional: Negative for chills, fatigue and fever.  Eyes: Negative for visual disturbance.  Respiratory: Negative for cough, chest tightness, shortness of breath and wheezing.   Cardiovascular: Negative for chest pain, palpitations and leg swelling.  Neurological: Negative for dizziness, seizures, syncope, weakness and light-headedness.       Objective:   Physical Exam Constitutional:      Appearance: She is well-developed.  Eyes:     Pupils: Pupils are equal, round, and reactive to light.  Neck:     Musculoskeletal: Neck supple.     Thyroid: No thyromegaly.     Vascular: No JVD.  Cardiovascular:  Rate and Rhythm: Normal rate and regular rhythm.     Heart sounds: No gallop.   Pulmonary:     Effort: Pulmonary effort is normal. No respiratory distress.     Breath sounds: Normal breath sounds. No wheezing or rales.  Musculoskeletal:     Right lower leg: No edema.     Left lower leg: No edema.  Neurological:     Mental Status: She is alert.        Assessment:     Hypertension.  Currently poorly controlled by several readings.  Repeat by me left arm seated after rest 162/82    Plan:     -Continue lisinopril 20 mg daily -Add HCTZ 12.5 mg daily -Work on weight loss -Recommend follow-up with primary  in 3 to 4 weeks to reassess -Consider basic metabolic panel at follow-up  Eulas Post MD [facility]

## 2018-12-03 NOTE — Patient Instructions (Signed)
Set up follow up with Dr Maudie Mercury in about one month.

## 2018-12-04 DIAGNOSIS — H2511 Age-related nuclear cataract, right eye: Secondary | ICD-10-CM | POA: Diagnosis not present

## 2018-12-04 MED FILL — OXYBUTYNIN CL ER 15 MG TAB: 15 | 90 days supply | Qty: 90 | Fill #2

## 2018-12-05 DIAGNOSIS — H2511 Age-related nuclear cataract, right eye: Secondary | ICD-10-CM | POA: Diagnosis not present

## 2018-12-05 DIAGNOSIS — H2512 Age-related nuclear cataract, left eye: Secondary | ICD-10-CM | POA: Diagnosis not present

## 2018-12-26 ENCOUNTER — Other Ambulatory Visit: Payer: Self-pay

## 2018-12-26 ENCOUNTER — Ambulatory Visit: Payer: 59 | Admitting: Family Medicine

## 2018-12-26 ENCOUNTER — Encounter: Payer: Self-pay | Admitting: Family Medicine

## 2018-12-26 VITALS — BP 118/70 | HR 81 | Temp 96.1°F | Ht <= 58 in | Wt 142.8 lb

## 2018-12-26 DIAGNOSIS — M8580 Other specified disorders of bone density and structure, unspecified site: Secondary | ICD-10-CM | POA: Diagnosis not present

## 2018-12-26 DIAGNOSIS — I10 Essential (primary) hypertension: Secondary | ICD-10-CM

## 2018-12-26 DIAGNOSIS — E785 Hyperlipidemia, unspecified: Secondary | ICD-10-CM | POA: Diagnosis not present

## 2018-12-26 MED ORDER — ROSUVASTATIN CALCIUM 20 MG PO TABS: 20.0000 mg | ORAL_TABLET | Freq: Every day | ORAL | 1 refills | Status: DC

## 2018-12-26 MED ORDER — LISINOPRIL 20 MG PO TABS: 20.0000 mg | ORAL_TABLET | Freq: Every day | ORAL | 1 refills | Status: DC

## 2018-12-26 MED ORDER — HYDROCHLOROTHIAZIDE 12.5 MG PO CAPS: 12.5000 mg | ORAL_CAPSULE | Freq: Every day | ORAL | 1 refills | Status: DC

## 2018-12-26 MED FILL — ROSUVASTATIN CALCIUM 20 MG: 20 | 90 days supply | Qty: 90 | Fill #0

## 2018-12-26 NOTE — Patient Instructions (Signed)
Don't forget to get your flu shot this fall!  It was nice meeting you today. Let me know if any issues with blood pressure between now and next visit. Feel free to bring your cuff to your next visit so that we can compare against our office cuff.

## 2018-12-26 NOTE — Progress Notes (Signed)
[name] DOB: [birth year] Encounter date: 12/26/2018  This isa 63 y.o. female who presents to establish care. Chief Complaint  Patient presents with  . Establish Care    History of present illness: Recently seen 7/15 by Dr. Elease Hashimoto for elevated bp. HCTZ 12.5mg  daily added to lisinopril 20mg  daily. Yesterday at eye doc pressure was 143/88. Has been a bit all over the place. Elevations have shifted down into 62'Z diastolic. Had more salt this week than usual so she wasn't being quite as good with diet. Not running too low. No problems with hctz that was started.   GERD: no longer issue for her.   Inflammatory bowel disease: follows with GI - Dr. Loletha Carrow. Started when early 20's and had precancerous polyps removed. Has been under GI care since that time.   Follows with gyn; manage bone density: on prolia. Due for DEXA in September. Waiting for call back for scheduling.     Past Medical History:  Diagnosis Date  . B12 DEFICIENCY 02/09/2009   Qualifier: Diagnosis of  By: Ronnald Ramp RN, CGRN, Sheri    . Blood in stool   . COLONIC POLYPS, HYPERPLASTIC, HX OF 02/03/2009   Qualifier: Diagnosis of  By: Surface RN, Butch Penny    . DIVERTICULOSIS, COLON 02/03/2009   Qualifier: Diagnosis of  By: Varney Daily RN, Butch Penny    . Dyslipidemia 06/20/2012  . Fracture, intertrochanteric, left femur ([place]) 05/02/2012  . GERD (gastroesophageal reflux disease)   . Hearing loss    Bil/has hearing aids  . Hemorrhoids   . HEMORRHOIDS 02/03/2009   Qualifier: Diagnosis of  By: Varney Daily RN, Butch Penny    . Hypertension   . INFLAMMATORY BOWEL DISEASE - Followed by Dr. Sharlett Iles in GI 02/03/2009   Qualifier: Diagnosis of  By: Varney Daily RN, Butch Penny    . Osteopenia   . Turner's syndrome    was on Provera and Premarin and d/c this  at age 89yr  . Ulcerative colitis    Past Surgical History:  Procedure Laterality Date  . COLONOSCOPY  last 03/25/2013  . HERNIA REPAIR  2009   lt ing   . INCISION AND DRAINAGE  2004    lt thumb dog  bite  . INTRAMEDULLARY (IM) NAIL INTERTROCHANTERIC  05/02/2012   Procedure: INTRAMEDULLARY (IM) NAIL INTERTROCHANTRIC;  Surgeon: Johnny Bridge, MD;  Location: [facility];  Service: Orthopedics;  Laterality: Left;  . ORIF FINGER FRACTURE  06/14/2011   Procedure: OPEN REDUCTION INTERNAL FIXATION (ORIF) METACARPAL (FINGER) FRACTURE;  Surgeon: Tennis Must, MD;  Location: [facility];  Service: Orthopedics;  Laterality: Right;  right ring  . POLYPECTOMY    . TONSILLECTOMY    . UPPER GASTROINTESTINAL ENDOSCOPY     No Known Allergies Current Meds  Medication Sig  . Calcium Carbonate Antacid (CALCIUM CARBONATE PO) Take by mouth.  . denosumab (PROLIA) 60 MG/ML SOLN injection Inject 60 mg into the skin every 6 (six) months. Administer in upper arm, thigh, or abdomen  . hydrochlorothiazide (MICROZIDE) 12.5 MG capsule Take 1 capsule (12.5 mg total) by mouth daily.  Marland Kitchen lisinopril (ZESTRIL) 20 MG tablet Take 1 tablet (20 mg total) by mouth daily.  Marland Kitchen oxybutynin (DITROPAN XL) 15 MG 24 hr tablet TAKE 1 TABLET BY MOUTH EVERY 24 HOURS DAILY  . rosuvastatin (CRESTOR) 20 MG tablet Take 1 tablet (20 mg total) by mouth daily.  Marland Kitchen VITAMIN D PO Take 2,000 Units by mouth daily.  . [DISCONTINUED] hydrochlorothiazide (MICROZIDE) 12.5 MG capsule Take 1 capsule (12.5  mg total) by mouth daily.  . [DISCONTINUED] lisinopril (ZESTRIL) 20 MG tablet Take 1 tablet (20 mg total) by mouth daily.  . [DISCONTINUED] rosuvastatin (CRESTOR) 20 MG tablet TAKE 1 TABLET (20 MG TOTAL) BY MOUTH DAILY.   Current Facility-Administered Medications for the 12/26/18 encounter (Office Visit) with Caren Macadam, MD  Medication  . 0.9 %  sodium chloride infusion   Social History   Tobacco Use  . Smoking status: Never Smoker  . Smokeless tobacco: Never Used  Substance Use Topics  . Alcohol use: Yes    Alcohol/week: 1.0 standard drinks    Types: 1 Glasses of wine per week    Comment: occ.   Family History  Problem  Relation Age of Onset  . Lymphoma Mother        nhl-mantle cell  . Lymphoma Father        nhl  . Healthy Sister   . Melanoma Maternal Grandmother   . Heart disease Maternal Grandfather   . Ovarian cancer Paternal Grandmother   . Pancreatic cancer Paternal Grandfather   . Colon cancer Neg Hx   . Esophageal cancer Neg Hx   . Stomach cancer Neg Hx   . Rectal cancer Neg Hx   . Breast cancer Neg Hx   . Colon polyps Neg Hx      Review of Systems  Constitutional: Negative for chills, fatigue and fever.  Respiratory: Negative for cough, chest tightness, shortness of breath and wheezing.   Cardiovascular: Negative for chest pain, palpitations and leg swelling.    Objective:  BP 118/70 (BP Location: Left Arm, Patient Position: Sitting, Cuff Size: Normal)   Pulse 81   Temp (!) 96.1 F (35.6 C) (Temporal)   Ht 4\' 9"  (1.448 m)   Wt 142 lb 12.8 oz (64.8 kg)   SpO2 98%   BMI 30.90 kg/m   Weight: 142 lb 12.8 oz (64.8 kg)   BP Readings from Last 3 Encounters:  12/26/18 118/70  12/03/18 (!) 156/92  11/20/18 133/61   Wt Readings from Last 3 Encounters:  12/26/18 142 lb 12.8 oz (64.8 kg)  12/03/18 141 lb 8 oz (64.2 kg)  11/04/18 144 lb 11.2 oz (65.6 kg)    Physical Exam Constitutional:      General: She is not in acute distress.    Appearance: She is well-developed.  Cardiovascular:     Rate and Rhythm: Normal rate and regular rhythm.     Heart sounds: Murmur present. Systolic murmur present with a grade of 2/6. No friction rub.     Comments: Trace edema bilat Pulmonary:     Effort: Pulmonary effort is normal. No respiratory distress.     Breath sounds: Normal breath sounds. No wheezing or rales.  Musculoskeletal:     Right lower leg: No edema.     Left lower leg: No edema.  Neurological:     Mental Status: She is alert and oriented to person, place, and time.  Psychiatric:        Behavior: Behavior normal.     Assessment/Plan:  1. Essential hypertension Improved  control. Continue current medications. We will recheck bloodwork prior to next visit. She will continue to check bp at home.  - hydrochlorothiazide (MICROZIDE) 12.5 MG capsule; Take 1 capsule (12.5 mg total) by mouth daily.  Dispense: 90 capsule; Refill: 1 - rosuvastatin (CRESTOR) 20 MG tablet; Take 1 tablet (20 mg total) by mouth daily.  Dispense: 90 tablet; Refill: 1 - Basic metabolic panel; Future  2. Osteopenia, unspecified location Followed by gyn. On prolia, D, Ca.   3. Dyslipidemia Stable on crestor. Continue current medication.   Return in about 3 months (around 03/28/2019) for physical exam.  Micheline Rough, MD

## 2018-12-29 DIAGNOSIS — H2512 Age-related nuclear cataract, left eye: Secondary | ICD-10-CM | POA: Diagnosis not present

## 2018-12-31 ENCOUNTER — Other Ambulatory Visit: Payer: Self-pay | Admitting: Obstetrics

## 2018-12-31 DIAGNOSIS — M810 Age-related osteoporosis without current pathological fracture: Secondary | ICD-10-CM

## 2018-12-31 DIAGNOSIS — Z1231 Encounter for screening mammogram for malignant neoplasm of breast: Secondary | ICD-10-CM

## 2018-12-31 MED FILL — HYDROCHLOROTHIAZIDE 12.5 MG: 12.5 | 90 days supply | Qty: 90 | Fill #0

## 2019-03-11 ENCOUNTER — Ambulatory Visit
Admission: RE | Admit: 2019-03-11 | Discharge: 2019-03-11 | Disposition: A | Discharge status: Home / Self Care | Payer: 59 | Source: Ambulatory Visit | Attending: Obstetrics | Admitting: Obstetrics

## 2019-03-11 ENCOUNTER — Other Ambulatory Visit: Payer: Self-pay

## 2019-03-11 DIAGNOSIS — M810 Age-related osteoporosis without current pathological fracture: Secondary | ICD-10-CM

## 2019-03-11 DIAGNOSIS — Z1231 Encounter for screening mammogram for malignant neoplasm of breast: Secondary | ICD-10-CM

## 2019-03-11 DIAGNOSIS — Z780 Asymptomatic menopausal state: Secondary | ICD-10-CM | POA: Diagnosis not present

## 2019-03-11 DIAGNOSIS — M8589 Other specified disorders of bone density and structure, multiple sites: Secondary | ICD-10-CM | POA: Diagnosis not present

## 2019-03-13 MED FILL — OXYBUTYNIN CL ER 15 MG TAB: 15 | 90 days supply | Qty: 90 | Fill #3

## 2019-04-10 MED FILL — ROSUVASTATIN CALCIUM 20 MG: 20 | 90 days supply | Qty: 90 | Fill #1

## 2019-04-14 MED FILL — HYDROCHLOROTHIAZIDE 12.5 MG: 12.5 | 90 days supply | Qty: 90 | Fill #1

## 2019-05-04 DIAGNOSIS — Z01419 Encounter for gynecological examination (general) (routine) without abnormal findings: Secondary | ICD-10-CM | POA: Diagnosis not present

## 2019-05-04 DIAGNOSIS — R32 Unspecified urinary incontinence: Secondary | ICD-10-CM | POA: Diagnosis not present

## 2019-05-04 DIAGNOSIS — M810 Age-related osteoporosis without current pathological fracture: Secondary | ICD-10-CM | POA: Diagnosis not present

## 2019-05-04 DIAGNOSIS — N3941 Urge incontinence: Secondary | ICD-10-CM | POA: Diagnosis not present

## 2019-05-04 DIAGNOSIS — Z6832 Body mass index (BMI) 32.0-32.9, adult: Secondary | ICD-10-CM | POA: Diagnosis not present

## 2019-05-04 MED FILL — TOLTERODINE TART ER 4 MG CA: 4 | 30 days supply | Qty: 30 | Fill #0

## 2019-05-18 ENCOUNTER — Other Ambulatory Visit (INDEPENDENT_AMBULATORY_CARE_PROVIDER_SITE_OTHER): Payer: 59

## 2019-05-18 ENCOUNTER — Other Ambulatory Visit: Payer: Self-pay

## 2019-05-18 DIAGNOSIS — I10 Essential (primary) hypertension: Secondary | ICD-10-CM | POA: Diagnosis not present

## 2019-05-18 LAB — BASIC METABOLIC PANEL
BUN: 23 mg/dL (ref 6–23)
CO2: 30 mEq/L (ref 19–32)
Calcium: 9.6 mg/dL (ref 8.4–10.5)
Chloride: 102 mEq/L (ref 96–112)
Creatinine, Ser: 0.79 mg/dL (ref 0.40–1.20)
GFR: 73.54 mL/min (ref >60.00)
Glucose, Bld: 105 mg/dL — ABNORMAL HIGH (ref 70–99)
Potassium: 4 mEq/L (ref 3.5–5.1)
Sodium: 139 mEq/L (ref 135–145)

## 2019-05-20 ENCOUNTER — Other Ambulatory Visit: Payer: 59

## 2019-05-27 MED FILL — LISINOPRIL 20 MG TABLET: 20 | 90 days supply | Qty: 90 | Fill #1

## 2019-05-29 ENCOUNTER — Encounter: Payer: 59 | Admitting: Family Medicine

## 2019-05-29 NOTE — Progress Notes (Deleted)
[name] DOB: 1956-01-04 Encounter date: 05/29/2019  This is a 64 y.o. female who presents for complete physical   History of present illness/Additional concerns: At last visit we discussed elevated blood pressures: Inflammatory bowel disease: Follows with Dr. Loletha Carrow. She does see gynecology regularly and they manage her bone density (she is on Prolia). ***  Past Medical History:  Diagnosis Date  . B12 DEFICIENCY 02/09/2009   Qualifier: Diagnosis of  By: Ronnald Ramp RN, CGRN, Sheri    . Blood in stool   . COLONIC POLYPS, HYPERPLASTIC, HX OF 02/03/2009   Qualifier: Diagnosis of  By: Surface RN, Butch Penny    . DIVERTICULOSIS, COLON 02/03/2009   Qualifier: Diagnosis of  By: Varney Daily RN, Butch Penny    . Dyslipidemia 06/20/2012  . Fracture, intertrochanteric, left femur ([place]) 05/02/2012  . GERD (gastroesophageal reflux disease)   . Hearing loss    Bil/has hearing aids  . Hemorrhoids   . HEMORRHOIDS 02/03/2009   Qualifier: Diagnosis of  By: Varney Daily RN, Butch Penny    . Hypertension   . INFLAMMATORY BOWEL DISEASE - Followed by Dr. Sharlett Iles in GI 02/03/2009   Qualifier: Diagnosis of  By: Varney Daily RN, Butch Penny    . Osteopenia   . Turner's syndrome    was on Provera and Premarin and d/c this  at age 71yr  . Ulcerative colitis    Past Surgical History:  Procedure Laterality Date  . COLONOSCOPY  last 03/25/2013  . HERNIA REPAIR  2009   lt ing   . INCISION AND DRAINAGE  2004    lt thumb dog bite  . INTRAMEDULLARY (IM) NAIL INTERTROCHANTERIC  05/02/2012   Procedure: INTRAMEDULLARY (IM) NAIL INTERTROCHANTRIC;  Surgeon: Johnny Bridge, MD;  Location: [facility];  Service: Orthopedics;  Laterality: Left;  . ORIF FINGER FRACTURE  06/14/2011   Procedure: OPEN REDUCTION INTERNAL FIXATION (ORIF) METACARPAL (FINGER) FRACTURE;  Surgeon: Tennis Must, MD;  Location: [facility];  Service: Orthopedics;  Laterality: Right;  right ring  . POLYPECTOMY    . TONSILLECTOMY    . UPPER GASTROINTESTINAL ENDOSCOPY      No Known Allergies No outpatient medications have been marked as taking for the 05/29/19 encounter (Appointment) with Caren Macadam, MD.   Current Facility-Administered Medications for the 05/29/19 encounter (Appointment) with Caren Macadam, MD  Medication  . 0.9 %  sodium chloride infusion   Social History   Tobacco Use  . Smoking status: Never Smoker  . Smokeless tobacco: Never Used  Substance Use Topics  . Alcohol use: Yes    Alcohol/week: 1.0 standard drinks    Types: 1 Glasses of wine per week    Comment: occ.   Family History  Problem Relation Age of Onset  . Lymphoma Mother        nhl-mantle cell  . Lymphoma Father        nhl  . Healthy Sister   . Melanoma Maternal Grandmother   . Heart disease Maternal Grandfather   . Ovarian cancer Paternal Grandmother   . Pancreatic cancer Paternal Grandfather   . Colon cancer Neg Hx   . Esophageal cancer Neg Hx   . Stomach cancer Neg Hx   . Rectal cancer Neg Hx   . Breast cancer Neg Hx   . Colon polyps Neg Hx      Review of Systems  CBC:  Lab Results  Component Value Date   WBC 4.0 11/12/2018   HGB 14.9 11/12/2018   HCT 44.5 11/12/2018  MCH 29.5 05/16/2013   MCH 30.8 05/07/2012   MCHC 33.5 11/12/2018   RDW 12.9 11/12/2018   PLT 180.0 11/12/2018   MPV 10.3 05/16/2013   CMP: Lab Results  Component Value Date   NA 139 05/18/2019   K 4.0 05/18/2019   CL 102 05/18/2019   CO2 30 05/18/2019   GLUCOSE 105 (H) 05/18/2019   BUN 23 05/18/2019   CREATININE 0.79 05/18/2019   GFRAA >90 05/07/2012   CALCIUM 9.6 05/18/2019   PROT 5.7 (L) 05/07/2012   BILITOT 0.9 05/07/2012   ALKPHOS 157 (H) 05/07/2012   ALT 96 (H) 05/07/2012   AST 80 (H) 05/07/2012   LIPID: Lab Results  Component Value Date   CHOL 163 11/12/2018   TRIG 69.0 11/12/2018   HDL 57.10 11/12/2018   LDLCALC 92 11/12/2018    Objective:  There were no vitals taken for this visit.      BP Readings from Last 3 Encounters:  12/26/18  118/70  12/03/18 (!) 156/92  11/20/18 133/61   Wt Readings from Last 3 Encounters:  12/26/18 142 lb 12.8 oz (64.8 kg)  12/03/18 141 lb 8 oz (64.2 kg)  11/04/18 144 lb 11.2 oz (65.6 kg)    Physical Exam  Assessment/Plan: Health Maintenance Due  Topic Date Due  . INFLUENZA VACCINE  12/20/2018  . PAP SMEAR-Modifier  05/22/2019   Health Maintenance reviewed - {health maintenance:315237}.  There are no diagnoses linked to this encounter.  No follow-ups on file.  Micheline Rough, MD

## 2019-06-01 ENCOUNTER — Telehealth: Payer: Self-pay | Admitting: *Deleted

## 2019-06-01 NOTE — Telephone Encounter (Signed)
Copied from [place] [id number]. Topic: General - Other >> Jun 01, 2019 10:35 AM Celene Kras wrote: Reason for CRM: Pt called and is requesting to have the results of her  blood work. Please advise.

## 2019-06-02 NOTE — Telephone Encounter (Signed)
No worries. Noted.

## 2019-06-02 NOTE — Telephone Encounter (Signed)
Blood work is stable.  Patient had an appointment on January 8 which she did not show for which I had planned to discuss blood work with her at.  Kidney function and electrolytes look good.

## 2019-06-02 NOTE — Telephone Encounter (Signed)
Spoke with the pt and informed her of the message below.  Patient stated she was not a no-show as she spoke with someone here the day before her appt, told them her husband was exposed to [place] and they told her not to come in.  Patient stated she rescheduled the physical for 4/9 and message sent to PCP.

## 2019-06-08 MED FILL — TOLTERODINE TART ER 4 MG CA: 4 | 30 days supply | Qty: 30 | Fill #1

## 2019-07-23 ENCOUNTER — Other Ambulatory Visit (HOSPITAL_COMMUNITY): Payer: Self-pay | Admitting: Obstetrics

## 2019-07-23 MED FILL — TOLTERODINE TARTRATE ER 4 M: 4 | 30 days supply | Qty: 30 | Fill #0

## 2019-07-28 ENCOUNTER — Other Ambulatory Visit: Payer: Self-pay | Admitting: Family Medicine

## 2019-07-28 DIAGNOSIS — I10 Essential (primary) hypertension: Secondary | ICD-10-CM

## 2019-07-28 MED FILL — ROSUVASTATIN CALCIUM 20 MG: 20 | 90 days supply | Qty: 90 | Fill #0

## 2019-08-06 ENCOUNTER — Other Ambulatory Visit: Payer: Self-pay | Admitting: Family Medicine

## 2019-08-06 DIAGNOSIS — I10 Essential (primary) hypertension: Secondary | ICD-10-CM

## 2019-08-07 MED FILL — HYDROCHLOROTHIAZIDE 12.5 MG: 12.5 | 90 days supply | Qty: 90 | Fill #0

## 2019-08-27 ENCOUNTER — Other Ambulatory Visit: Payer: Self-pay

## 2019-08-28 ENCOUNTER — Encounter: Payer: Self-pay | Admitting: Family Medicine

## 2019-08-28 ENCOUNTER — Ambulatory Visit (INDEPENDENT_AMBULATORY_CARE_PROVIDER_SITE_OTHER): Payer: 59 | Admitting: Family Medicine

## 2019-08-28 VITALS — BP 140/82 | HR 82 | Temp 98.0°F | Ht <= 58 in | Wt 145.0 lb

## 2019-08-28 DIAGNOSIS — H6123 Impacted cerumen, bilateral: Secondary | ICD-10-CM

## 2019-08-28 DIAGNOSIS — E785 Hyperlipidemia, unspecified: Secondary | ICD-10-CM

## 2019-08-28 DIAGNOSIS — Z0000 Encounter for general adult medical examination without abnormal findings: Secondary | ICD-10-CM

## 2019-08-28 DIAGNOSIS — I10 Essential (primary) hypertension: Secondary | ICD-10-CM | POA: Diagnosis not present

## 2019-08-28 NOTE — Progress Notes (Signed)
[name] DOB: [birth year] Encounter date: 08/28/2019  This is a 64 y.o. female who presents for complete physical   History of present illness/Additional concerns:  Feeling great. Only issue is right shoulder. Fine today. But on occasion gets some pain and takes aleve- usually just lasts 1-2 days. Thinks might keep aggravating at work.   HTN: lisinopril, hctz: at home usually gets better bp than that today: usually more in the 120's/78, but some are higher in 140's. No low pressures.   IBS: sees Dr. Loletha Carrow; still up and down; no new concerns.   Follows with gyn Last colonoscopy 04/2018; repeat due 05/2028  Past Medical History:  Diagnosis Date  . B12 DEFICIENCY 02/09/2009   Qualifier: Diagnosis of  By: Ronnald Ramp RN, CGRN, Sheri    . Blood in stool   . COLONIC POLYPS, HYPERPLASTIC, HX OF 02/03/2009   Qualifier: Diagnosis of  By: Surface RN, Butch Penny    . DIVERTICULOSIS, COLON 02/03/2009   Qualifier: Diagnosis of  By: Varney Daily RN, Butch Penny    . Dyslipidemia 06/20/2012  . Fracture, intertrochanteric, left femur ([place]) 05/02/2012  . GERD (gastroesophageal reflux disease)   . Hearing loss    Bil/has hearing aids  . Hemorrhoids   . HEMORRHOIDS 02/03/2009   Qualifier: Diagnosis of  By: Varney Daily RN, Butch Penny    . Hypertension   . INFLAMMATORY BOWEL DISEASE - Followed by Dr. Sharlett Iles in GI 02/03/2009   Qualifier: Diagnosis of  By: Varney Daily RN, Butch Penny    . Osteopenia   . Turner's syndrome    was on Provera and Premarin and d/c this  at age 64yr  . Ulcerative colitis    Past Surgical History:  Procedure Laterality Date  . COLONOSCOPY  last 03/25/2013  . HERNIA REPAIR  2009   lt ing   . INCISION AND DRAINAGE  2004    lt thumb dog bite  . INTRAMEDULLARY (IM) NAIL INTERTROCHANTERIC  05/02/2012   Procedure: INTRAMEDULLARY (IM) NAIL INTERTROCHANTRIC;  Surgeon: Johnny Bridge, MD;  Location: [facility];  Service: Orthopedics;  Laterality: Left;  . ORIF FINGER FRACTURE  06/14/2011   Procedure: OPEN REDUCTION  INTERNAL FIXATION (ORIF) METACARPAL (FINGER) FRACTURE;  Surgeon: Tennis Must, MD;  Location: [facility];  Service: Orthopedics;  Laterality: Right;  right ring  . POLYPECTOMY    . TONSILLECTOMY    . UPPER GASTROINTESTINAL ENDOSCOPY     No Known Allergies Current Meds  Medication Sig  . Calcium Carbonate Antacid (CALCIUM CARBONATE PO) Take by mouth.  . denosumab (PROLIA) 60 MG/ML SOLN injection Inject 60 mg into the skin every 6 (six) months. Administer in upper arm, thigh, or abdomen  . hydrochlorothiazide (MICROZIDE) 12.5 MG capsule TAKE 1 CAPSULE BY MOUTH DAILY.  Marland Kitchen lisinopril (ZESTRIL) 20 MG tablet Take 1 tablet (20 mg total) by mouth daily.  Marland Kitchen oxybutynin (DITROPAN XL) 15 MG 24 hr tablet TAKE 1 TABLET BY MOUTH EVERY 24 HOURS DAILY  . rosuvastatin (CRESTOR) 20 MG tablet TAKE 1 TABLET BY MOUTH DAILY.  Marland Kitchen VITAMIN D PO Take 2,000 Units by mouth daily.   Current Facility-Administered Medications for the 08/28/19 encounter (Office Visit) with Caren Macadam, MD  Medication  . 0.9 %  sodium chloride infusion   Social History   Tobacco Use  . Smoking status: Never Smoker  . Smokeless tobacco: Never Used  Substance Use Topics  . Alcohol use: Yes    Alcohol/week: 1.0 standard drinks    Types: 1 Glasses of wine per  week    Comment: occ.   Family History  Problem Relation Age of Onset  . Lymphoma Mother        nhl-mantle cell  . Lymphoma Father        nhl  . Healthy Sister   . Melanoma Maternal Grandmother   . Heart disease Maternal Grandfather   . Ovarian cancer Paternal Grandmother   . Pancreatic cancer Paternal Grandfather   . Colon cancer Neg Hx   . Esophageal cancer Neg Hx   . Stomach cancer Neg Hx   . Rectal cancer Neg Hx   . Breast cancer Neg Hx   . Colon polyps Neg Hx      Review of Systems  Constitutional: Negative for activity change, appetite change, chills, fatigue, fever and unexpected weight change.  HENT: Negative for congestion, ear pain,  hearing loss, sinus pressure, sinus pain, sore throat and trouble swallowing.   Eyes: Negative for pain and visual disturbance.  Respiratory: Negative for cough, chest tightness, shortness of breath and wheezing.   Cardiovascular: Negative for chest pain, palpitations and leg swelling.  Gastrointestinal: Negative for abdominal pain, blood in stool, constipation, diarrhea, nausea and vomiting.  Genitourinary: Negative for difficulty urinating and menstrual problem.  Musculoskeletal: Negative for arthralgias and back pain.  Skin: Negative for rash.  Neurological: Negative for dizziness, weakness, numbness and headaches.  Hematological: Negative for adenopathy. Does not bruise/bleed easily.  Psychiatric/Behavioral: Negative for sleep disturbance and suicidal ideas. The patient is not nervous/anxious.     CBC:  Lab Results  Component Value Date   WBC 4.0 11/12/2018   HGB 14.9 11/12/2018   HCT 44.5 11/12/2018   MCH 29.5 05/16/2013   MCH 30.8 05/07/2012   MCHC 33.5 11/12/2018   RDW 12.9 11/12/2018   PLT 180.0 11/12/2018   MPV 10.3 05/16/2013   CMP: Lab Results  Component Value Date   NA 139 05/18/2019   K 4.0 05/18/2019   CL 102 05/18/2019   CO2 30 05/18/2019   GLUCOSE 105 (H) 05/18/2019   BUN 23 05/18/2019   CREATININE 0.79 05/18/2019   GFRAA >90 05/07/2012   CALCIUM 9.6 05/18/2019   PROT 5.7 (L) 05/07/2012   BILITOT 0.9 05/07/2012   ALKPHOS 157 (H) 05/07/2012   ALT 96 (H) 05/07/2012   AST 80 (H) 05/07/2012   LIPID: Lab Results  Component Value Date   CHOL 163 11/12/2018   TRIG 69.0 11/12/2018   HDL 57.10 11/12/2018   LDLCALC 92 11/12/2018    Objective:  BP 140/82 (BP Location: Right Arm, Patient Position: Sitting, Cuff Size: Normal)   Pulse 82   Temp 98 F (36.7 C) (Temporal)   Ht 4\' 8"  (1.422 m)   Wt 145 lb (65.8 kg)   BMI 32.51 kg/m   Weight: 145 lb (65.8 kg)   BP Readings from Last 3 Encounters:  08/28/19 140/82  12/26/18 118/70  12/03/18 (!) 156/92    Wt Readings from Last 3 Encounters:  08/28/19 145 lb (65.8 kg)  12/26/18 142 lb 12.8 oz (64.8 kg)  12/03/18 141 lb 8 oz (64.2 kg)    Physical Exam Constitutional:      General: She is not in acute distress.    Appearance: She is well-developed.  HENT:     Head: Normocephalic and atraumatic.     Comments: Cerumen impaction resolve with irrigation, lighted currette use on left; hemostat/microscope right    Right Ear: External ear normal.     Left Ear: External ear normal.  Mouth/Throat:     Pharynx: No oropharyngeal exudate.  Eyes:     Conjunctiva/sclera: Conjunctivae normal.     Pupils: Pupils are equal, round, and reactive to light.  Neck:     Thyroid: No thyromegaly.  Cardiovascular:     Rate and Rhythm: Normal rate and regular rhythm.     Heart sounds: Normal heart sounds. No murmur. No friction rub. No gallop.   Pulmonary:     Effort: Pulmonary effort is normal.     Breath sounds: Normal breath sounds.  Abdominal:     General: Bowel sounds are normal. There is no distension.     Palpations: Abdomen is soft. There is no mass.     Tenderness: There is no abdominal tenderness. There is no guarding.     Hernia: No hernia is present.  Musculoskeletal:        General: No tenderness or deformity. Normal range of motion.     Cervical back: Normal range of motion and neck supple.  Lymphadenopathy:     Cervical: No cervical adenopathy.  Skin:    General: Skin is warm and dry.     Findings: No rash.  Neurological:     Mental Status: She is alert and oriented to person, place, and time.     Deep Tendon Reflexes: Reflexes normal.     Reflex Scores:      Tricep reflexes are 2+ on the right side and 2+ on the left side.      Bicep reflexes are 2+ on the right side and 2+ on the left side.      Brachioradialis reflexes are 2+ on the right side and 2+ on the left side.      Patellar reflexes are 2+ on the right side and 2+ on the left side. Psychiatric:        Speech:  Speech normal.        Behavior: Behavior normal.        Thought Content: Thought content normal.     Assessment/Plan: There are no preventive care reminders to display for this patient. Health Maintenance reviewed.  1. Preventative health care Discussed healthy eating, regular exercise  2. Essential hypertension Well controlled; continue current medication - CBC with Differential/Platelet; Future - Comprehensive metabolic panel; Future  3. Dyslipidemia Does well with the crestor - Lipid panel; Future  4. Bilateral hearing loss due to cerumen impaction Resolved in office with immediate improvement in symptoms. - Ear Lavage  Return for bloodwork in June; CCV in 6 months.  Micheline Rough, MD

## 2019-09-01 MED FILL — TOLTERODINE TART ER 4 MG CA: 4 | 30 days supply | Qty: 30 | Fill #1

## 2019-09-07 DIAGNOSIS — H35371 Puckering of macula, right eye: Secondary | ICD-10-CM | POA: Diagnosis not present

## 2019-09-07 DIAGNOSIS — H26493 Other secondary cataract, bilateral: Secondary | ICD-10-CM | POA: Diagnosis not present

## 2019-09-07 DIAGNOSIS — Z961 Presence of intraocular lens: Secondary | ICD-10-CM | POA: Diagnosis not present

## 2019-09-07 DIAGNOSIS — H538 Other visual disturbances: Secondary | ICD-10-CM | POA: Diagnosis not present

## 2019-09-11 DIAGNOSIS — H26492 Other secondary cataract, left eye: Secondary | ICD-10-CM | POA: Diagnosis not present

## 2019-09-11 DIAGNOSIS — H18413 Arcus senilis, bilateral: Secondary | ICD-10-CM | POA: Diagnosis not present

## 2019-09-11 DIAGNOSIS — Z961 Presence of intraocular lens: Secondary | ICD-10-CM | POA: Diagnosis not present

## 2019-09-11 DIAGNOSIS — H26491 Other secondary cataract, right eye: Secondary | ICD-10-CM | POA: Diagnosis not present

## 2019-09-21 DIAGNOSIS — H26492 Other secondary cataract, left eye: Secondary | ICD-10-CM | POA: Diagnosis not present

## 2019-09-22 ENCOUNTER — Other Ambulatory Visit: Payer: Self-pay | Admitting: Family Medicine

## 2019-09-22 MED FILL — LISINOPRIL 20 MG TABLET: 20 | 90 days supply | Qty: 90 | Fill #0

## 2019-09-28 DIAGNOSIS — H26491 Other secondary cataract, right eye: Secondary | ICD-10-CM | POA: Diagnosis not present

## 2019-10-09 MED FILL — TOLTERODINE TART ER 4 MG CA: 4 | 30 days supply | Qty: 30 | Fill #2

## 2019-11-16 MED FILL — TOLTERODINE TART ER 4 MG CA: 4 | 30 days supply | Qty: 30 | Fill #3

## 2019-11-19 MED FILL — ROSUVASTATIN CALCIUM 20 MG: 20 | 90 days supply | Qty: 90 | Fill #1

## 2019-11-19 MED FILL — HYDROCHLOROTHIAZIDE 12.5 MG: 12.5 | 90 days supply | Qty: 90 | Fill #1

## 2019-12-22 MED FILL — TOLTERODINE TART ER 4 MG CA: 4 | 30 days supply | Qty: 30 | Fill #4

## 2020-01-11 MED FILL — LISINOPRIL 20 MG TABLET: 20 | 90 days supply | Qty: 90 | Fill #1

## 2020-01-28 MED FILL — TOLTERODINE TART ER 4 MG CA: 4 | 30 days supply | Qty: 30 | Fill #5

## 2020-02-03 DIAGNOSIS — H903 Sensorineural hearing loss, bilateral: Secondary | ICD-10-CM | POA: Diagnosis not present

## 2020-02-22 ENCOUNTER — Other Ambulatory Visit: Payer: Self-pay | Admitting: Obstetrics

## 2020-02-22 DIAGNOSIS — H903 Sensorineural hearing loss, bilateral: Secondary | ICD-10-CM | POA: Diagnosis not present

## 2020-02-22 DIAGNOSIS — Z1231 Encounter for screening mammogram for malignant neoplasm of breast: Secondary | ICD-10-CM

## 2020-03-09 ENCOUNTER — Other Ambulatory Visit: Payer: Self-pay | Admitting: Family Medicine

## 2020-03-09 DIAGNOSIS — I10 Essential (primary) hypertension: Secondary | ICD-10-CM

## 2020-03-09 MED FILL — ROSUVASTATIN CALCIUM 20 MG: 20 | 90 days supply | Qty: 90 | Fill #0

## 2020-03-09 MED FILL — TOLTERODINE TART ER 4 MG CA: 4 | 30 days supply | Qty: 30 | Fill #6

## 2020-03-11 ENCOUNTER — Ambulatory Visit: Payer: 59

## 2020-03-22 ENCOUNTER — Other Ambulatory Visit: Payer: Self-pay | Admitting: Family Medicine

## 2020-03-22 DIAGNOSIS — I10 Essential (primary) hypertension: Secondary | ICD-10-CM

## 2020-03-22 MED FILL — HYDROCHLOROTHIAZIDE 12.5 MG: 12.5 | 90 days supply | Qty: 90 | Fill #0

## 2020-03-24 ENCOUNTER — Ambulatory Visit
Admission: RE | Admit: 2020-03-24 | Discharge: 2020-03-24 | Disposition: A | Discharge status: Home / Self Care | Payer: 59 | Source: Ambulatory Visit | Attending: Obstetrics | Admitting: Obstetrics

## 2020-03-24 ENCOUNTER — Other Ambulatory Visit: Payer: Self-pay

## 2020-03-24 DIAGNOSIS — Z1231 Encounter for screening mammogram for malignant neoplasm of breast: Secondary | ICD-10-CM

## 2020-04-21 MED FILL — TOLTERODINE TART ER 4 MG CA: 4 | 30 days supply | Qty: 30 | Fill #7

## 2020-05-23 ENCOUNTER — Other Ambulatory Visit: Payer: Self-pay | Admitting: Family Medicine

## 2020-05-25 ENCOUNTER — Encounter: Payer: Self-pay | Admitting: Family Medicine

## 2020-05-25 ENCOUNTER — Other Ambulatory Visit: Payer: Self-pay | Admitting: Family Medicine

## 2020-05-25 ENCOUNTER — Ambulatory Visit: Payer: 59 | Admitting: Family Medicine

## 2020-05-25 ENCOUNTER — Other Ambulatory Visit: Payer: Self-pay

## 2020-05-25 VITALS — HR 111 | Temp 97.8°F | Ht <= 58 in | Wt 146.4 lb

## 2020-05-25 DIAGNOSIS — I10 Essential (primary) hypertension: Secondary | ICD-10-CM

## 2020-05-25 DIAGNOSIS — M8580 Other specified disorders of bone density and structure, unspecified site: Secondary | ICD-10-CM | POA: Diagnosis not present

## 2020-05-25 DIAGNOSIS — E785 Hyperlipidemia, unspecified: Secondary | ICD-10-CM

## 2020-05-25 MED ORDER — LISINOPRIL 30 MG PO TABS: 30.0000 mg | ORAL_TABLET | Freq: Every day | ORAL | 1 refills | Status: DC

## 2020-05-25 MED FILL — LISINOPRIL 30 MG TABS: 30 | 90 days supply | Qty: 90 | Fill #0

## 2020-05-25 NOTE — Patient Instructions (Signed)
If your blood pressure is staying regularly over 135 systolic after 2-3 weeks on the new lisinopril dose; let me know.   If your pressures regularly are less than 110 systolic or less than 70 diastolic let me know.

## 2020-05-25 NOTE — Progress Notes (Signed)
[name] DOB: 02-09-1956 Encounter date: 05/25/2020  This is a 65 y.o. female who presents with Chief Complaint  Patient presents with  . Follow-up    History of present illness: Last visit with me was 08/28/2019.  HTN: lisinopril 20 mg daily, hctz 12.5 mg daily: at home usually gets better bp than that today. She ran out of lisinopril x 2 days. Has checked a little at home and gotten numbers in the 121-132 range over 69-95. She did get an outlier of 157 systolic and then today at home was 162/95.   Hyperlipidemia: Crestor 20 mg daily  IBS: sees Dr. Myrtie Neither; still up and down; no new concerns. last colonoscopy 05/19/2018  Osteoporosis: Prolia 60 mg stopped since she has been on for 6 years.   Follows with gyn: last mammogram 03/2020, bone density 02/2019 Last colonoscopy 04/2018; repeat due 05/2028   No Known Allergies Current Meds  Medication Sig  . Calcium Carbonate Antacid (CALCIUM CARBONATE PO) Take by mouth.  . hydrochlorothiazide (MICROZIDE) 12.5 MG capsule TAKE 1 CAPSULE BY MOUTH DAILY.  Marland Kitchen lisinopril (ZESTRIL) 30 MG tablet Take 1 tablet (30 mg total) by mouth daily.  Marland Kitchen oxybutynin (DITROPAN XL) 15 MG 24 hr tablet TAKE 1 TABLET BY MOUTH EVERY 24 HOURS DAILY  . rosuvastatin (CRESTOR) 20 MG tablet TAKE 1 TABLET BY MOUTH DAILY.  Marland Kitchen VITAMIN D PO Take 2,000 Units by mouth daily.  . [DISCONTINUED] denosumab (PROLIA) 60 MG/ML SOLN injection Inject 60 mg into the skin every 6 (six) months. Administer in upper arm, thigh, or abdomen  . [DISCONTINUED] lisinopril (ZESTRIL) 20 MG tablet TAKE 1 TABLET BY MOUTH ONCE A DAY   Current Facility-Administered Medications for the 05/25/20 encounter (Office Visit) with Wynn Banker, MD  Medication  . 0.9 %  sodium chloride infusion    Review of Systems  Constitutional: Negative for chills, fatigue and fever.  Respiratory: Negative for cough, chest tightness, shortness of breath and wheezing.   Cardiovascular: Negative for chest  pain, palpitations and leg swelling.    Objective:  Pulse (!) 111   Temp 97.8 F (36.6 C) (Oral)   Ht 4\' 8"  (1.422 m)   Wt 146 lb 6.4 oz (66.4 kg)   BMI 32.82 kg/m   Weight: 146 lb 6.4 oz (66.4 kg)   BP Readings from Last 3 Encounters:  08/28/19 140/82  12/26/18 118/70  12/03/18 (!) 156/92   Wt Readings from Last 3 Encounters:  05/25/20 146 lb 6.4 oz (66.4 kg)  08/28/19 145 lb (65.8 kg)  12/26/18 142 lb 12.8 oz (64.8 kg)    Physical Exam Constitutional:      General: She is not in acute distress.    Appearance: She is well-developed.  Cardiovascular:     Rate and Rhythm: Normal rate and regular rhythm.     Heart sounds: Normal heart sounds. No murmur heard. No friction rub.  Pulmonary:     Effort: Pulmonary effort is normal. No respiratory distress.     Breath sounds: Normal breath sounds. No wheezing or rales.  Musculoskeletal:     Right lower leg: No edema.     Left lower leg: No edema.  Neurological:     Mental Status: She is alert and oriented to person, place, and time.  Psychiatric:        Behavior: Behavior normal.     Assessment/Plan  1. Essential hypertension Missed dose last couple of days of lisinopril but still running a little higher than desired at  home. Will increase lisinopril to 30mg  daily and keep hctz 12.5mg  on board.   2. Dyslipidemia crestor 20mg  regularly  3. Osteopenia, unspecified location Bone density has been stable. Followed by Dr. Pamala Hurry.    Return in about 3 months (around 08/23/2020) for physical exam.    Micheline Rough, MD

## 2020-06-08 MED FILL — TOLTERODINE TART ER 4 MG CA: 4 | 30 days supply | Qty: 30 | Fill #8

## 2020-06-11 IMAGING — MG DIGITAL SCREENING BILATERAL MAMMOGRAM WITH TOMO AND CAD
8 series · 8 of 24 positions shown · non-contrast
Comparison: Previous exam(s).

ACR Breast Density Category a: The breast tissue is almost entirely
fatty.

CLINICAL DATA: Screening.

EXAM:
DIGITAL SCREENING BILATERAL MAMMOGRAM WITH TOMO AND CAD

[L MLO synth-2D]
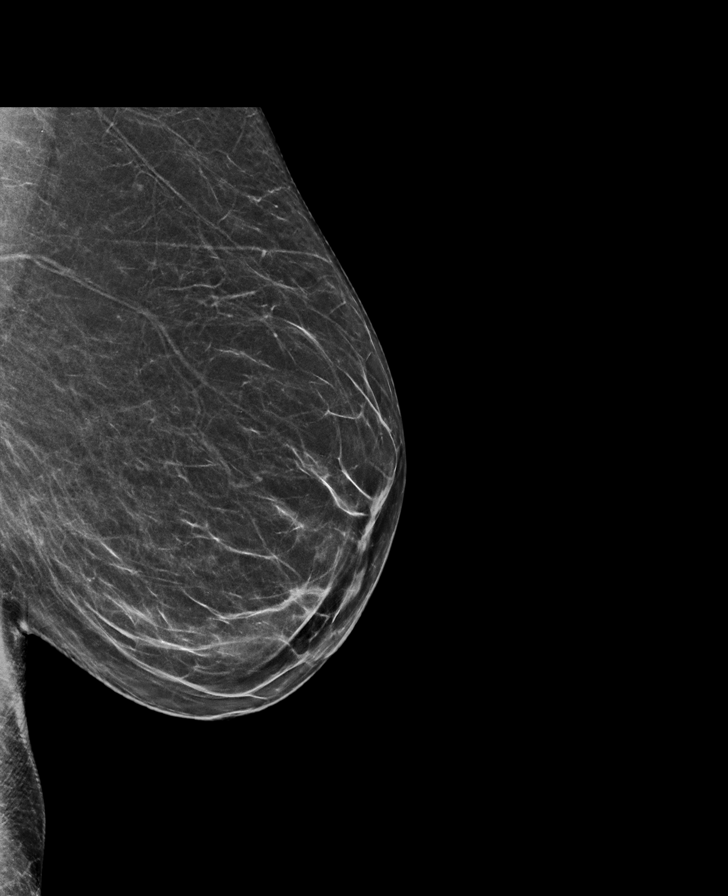

[R MLO synth-2D]
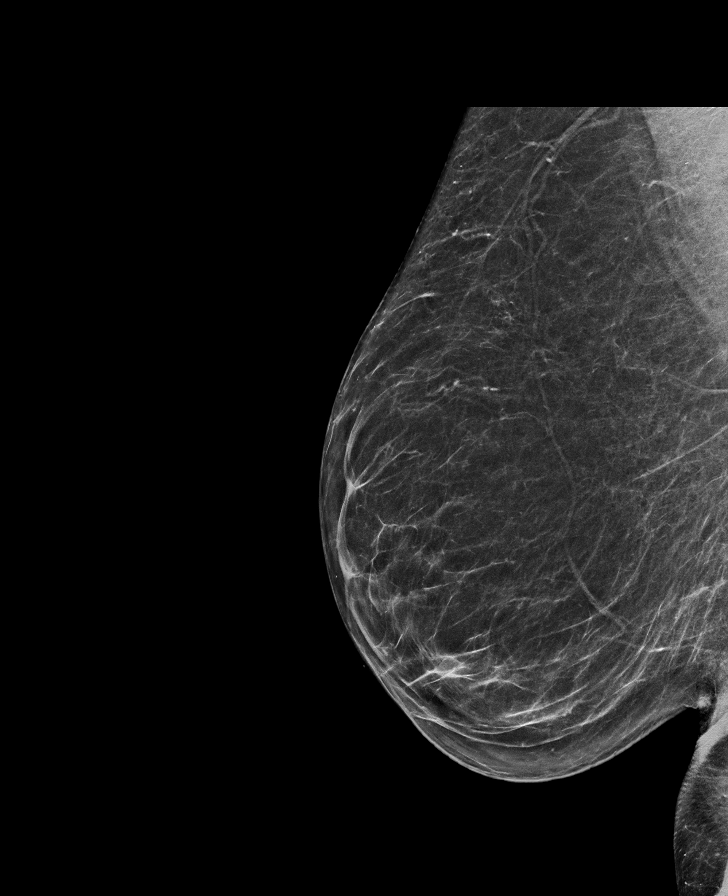

[R CC synth-2D]
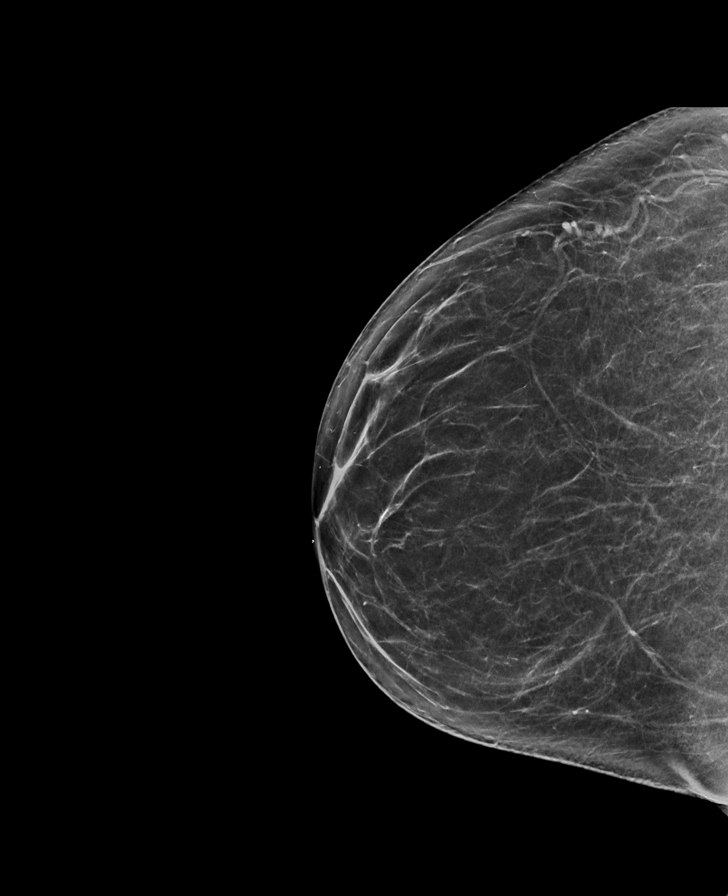

[L CC synth-2D]
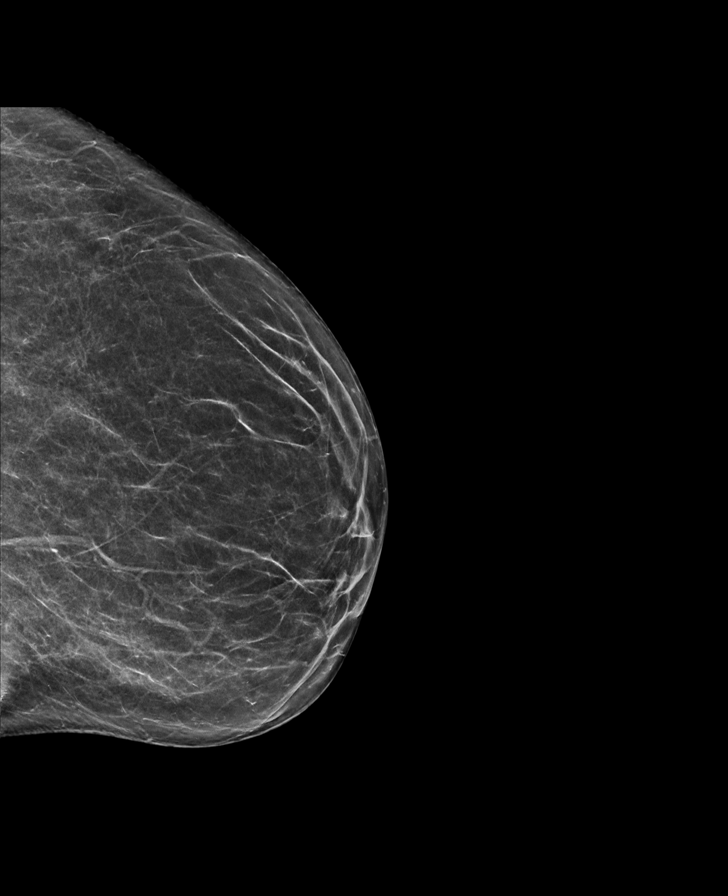

[R MLO tomo · tomo slice 37/74.0]
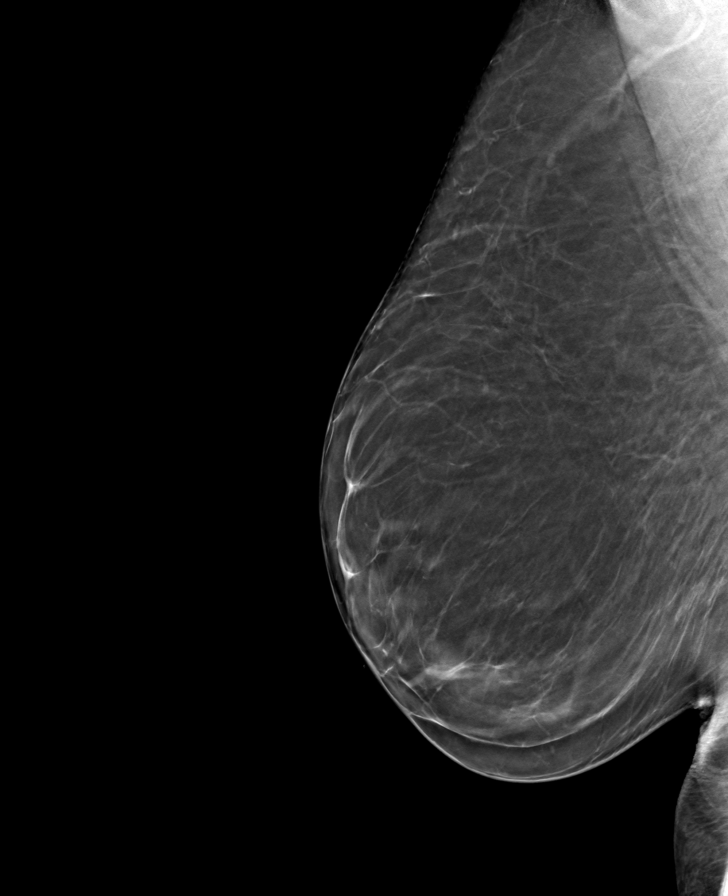

[L MLO tomo · tomo slice 34/67.0]
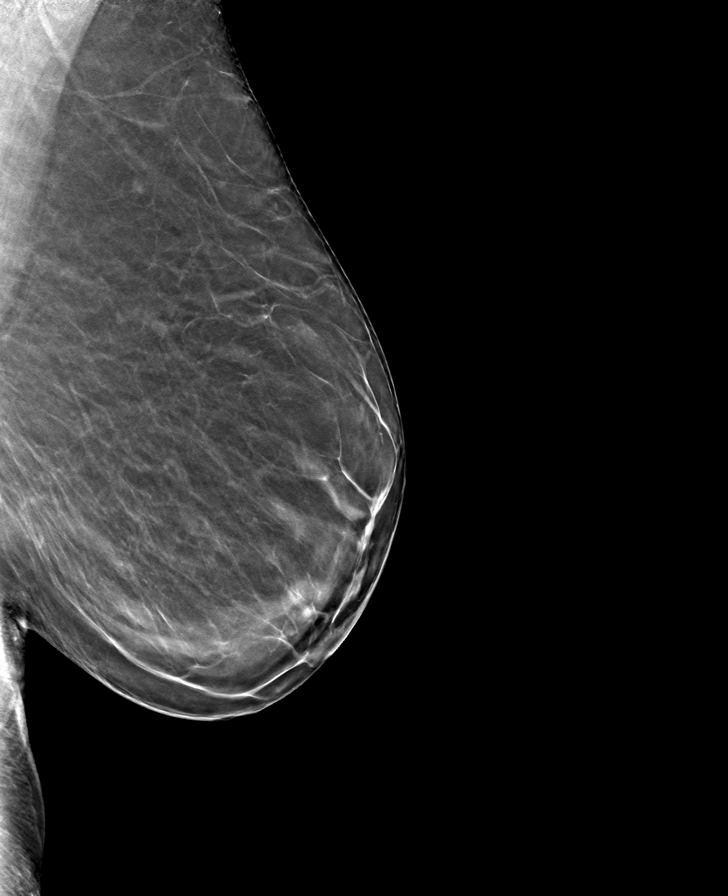

[L CC tomo · tomo slice 33/64.0]
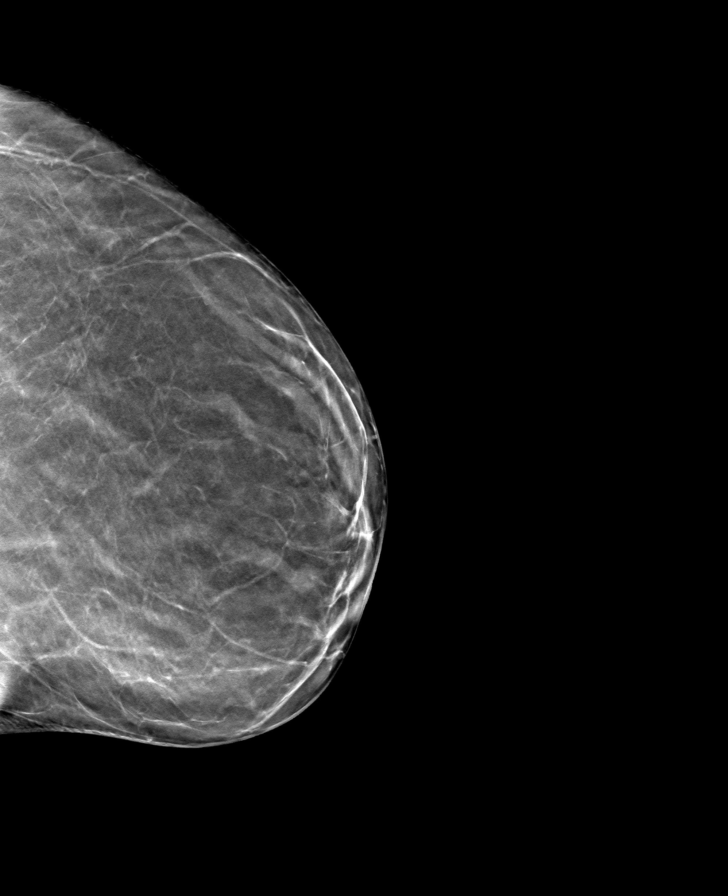

[R CC tomo · tomo slice 37/73.0]
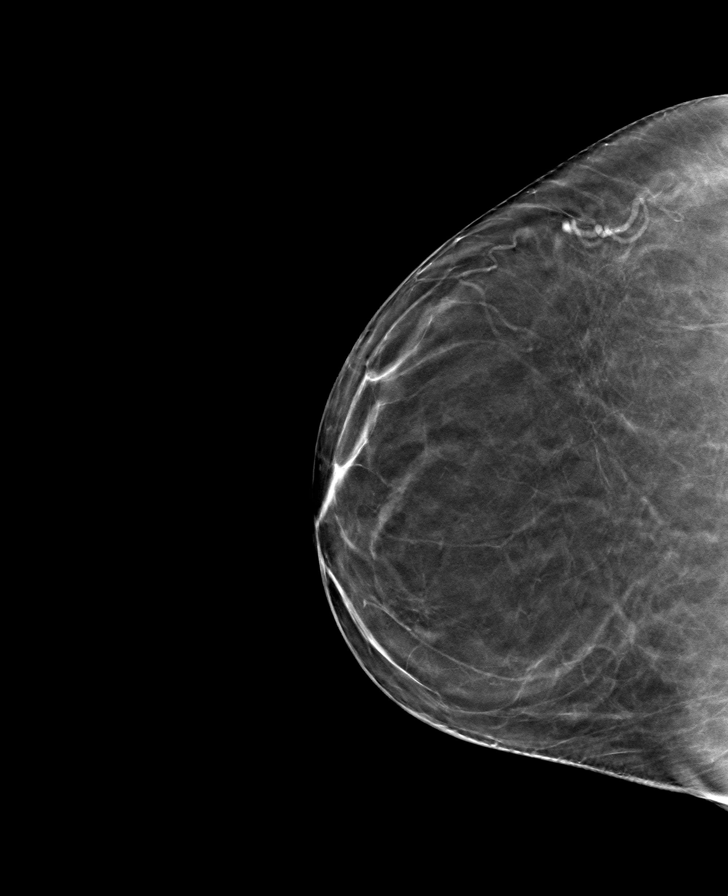

[8 of 24 positions shown; findings below may reference images not displayed]

FINDINGS: There are no findings suspicious for malignancy. Images were
processed with CAD.
IMPRESSION: No mammographic evidence of malignancy. A result letter of this
screening mammogram will be mailed directly to the patient.

RECOMMENDATION:
Screening mammogram in one year. (Code:[id number]-[id number])

BI-RADS CATEGORY  1: Negative.

## 2020-07-18 ENCOUNTER — Other Ambulatory Visit: Payer: Self-pay | Admitting: Family Medicine

## 2020-07-18 DIAGNOSIS — I10 Essential (primary) hypertension: Secondary | ICD-10-CM

## 2020-07-19 ENCOUNTER — Other Ambulatory Visit: Payer: Self-pay | Admitting: Family Medicine

## 2020-07-19 MED FILL — ROSUVASTATIN CALCIUM 20 MG: 20 | 90 days supply | Qty: 90 | Fill #0

## 2020-07-19 MED FILL — HYDROCHLOROTHIAZIDE 12.5 MG: 12.5 | 90 days supply | Qty: 90 | Fill #0

## 2020-07-22 ENCOUNTER — Other Ambulatory Visit (HOSPITAL_COMMUNITY): Payer: Self-pay | Admitting: Obstetrics

## 2020-07-22 MED FILL — TOLTERODINE TART ER 4 MG CA: 4 | 30 days supply | Qty: 30 | Fill #0

## 2020-08-17 DIAGNOSIS — Z124 Encounter for screening for malignant neoplasm of cervix: Secondary | ICD-10-CM | POA: Diagnosis not present

## 2020-08-17 DIAGNOSIS — Z01411 Encounter for gynecological examination (general) (routine) with abnormal findings: Secondary | ICD-10-CM | POA: Diagnosis not present

## 2020-08-17 DIAGNOSIS — M810 Age-related osteoporosis without current pathological fracture: Secondary | ICD-10-CM | POA: Diagnosis not present

## 2020-08-17 DIAGNOSIS — Z01419 Encounter for gynecological examination (general) (routine) without abnormal findings: Secondary | ICD-10-CM | POA: Diagnosis not present

## 2020-08-17 DIAGNOSIS — Z113 Encounter for screening for infections with a predominantly sexual mode of transmission: Secondary | ICD-10-CM | POA: Diagnosis not present

## 2020-08-17 DIAGNOSIS — Z6831 Body mass index (BMI) 31.0-31.9, adult: Secondary | ICD-10-CM | POA: Diagnosis not present

## 2020-08-18 ENCOUNTER — Other Ambulatory Visit: Payer: Self-pay | Admitting: Obstetrics

## 2020-08-18 DIAGNOSIS — M810 Age-related osteoporosis without current pathological fracture: Secondary | ICD-10-CM

## 2020-08-30 ENCOUNTER — Other Ambulatory Visit: Payer: Self-pay | Admitting: Obstetrics

## 2020-08-31 ENCOUNTER — Encounter: Payer: 59 | Admitting: Family Medicine

## 2020-09-02 ENCOUNTER — Other Ambulatory Visit (HOSPITAL_COMMUNITY): Payer: Self-pay

## 2020-09-02 ENCOUNTER — Other Ambulatory Visit: Payer: Self-pay | Admitting: Obstetrics

## 2020-09-08 ENCOUNTER — Other Ambulatory Visit: Payer: Self-pay | Admitting: Obstetrics

## 2020-09-08 ENCOUNTER — Other Ambulatory Visit (HOSPITAL_COMMUNITY): Payer: Self-pay

## 2020-09-08 DIAGNOSIS — Z1231 Encounter for screening mammogram for malignant neoplasm of breast: Secondary | ICD-10-CM

## 2020-09-09 ENCOUNTER — Other Ambulatory Visit (HOSPITAL_COMMUNITY): Payer: Self-pay

## 2020-09-09 MED ORDER — TOLTERODINE TARTRATE ER 4 MG PO CP24
4.0000 mg | ORAL_CAPSULE | Freq: Every day | ORAL | 10 refills | Status: DC
  Filled 2020-09-09: qty 30, 30d supply, fill #0
  Filled 2020-10-20: qty 30, 30d supply, fill #1
  Filled 2020-11-29: qty 30, 30d supply, fill #2
  Filled 2021-01-06: qty 30, 30d supply, fill #3
  Filled 2021-02-16: qty 30, 30d supply, fill #4
  Filled 2021-03-15 – 2021-03-28 (×2): qty 30, 30d supply, fill #5
  Filled 2021-05-09: qty 30, 30d supply, fill #6
  Filled 2021-06-16: qty 30, 30d supply, fill #7

## 2020-09-12 ENCOUNTER — Other Ambulatory Visit (HOSPITAL_COMMUNITY): Payer: Self-pay

## 2020-09-13 ENCOUNTER — Other Ambulatory Visit (HOSPITAL_COMMUNITY): Payer: Self-pay

## 2020-09-14 ENCOUNTER — Encounter: Payer: 59 | Admitting: Family Medicine

## 2020-09-14 ENCOUNTER — Ambulatory Visit (INDEPENDENT_AMBULATORY_CARE_PROVIDER_SITE_OTHER): Payer: 59 | Admitting: Family Medicine

## 2020-09-14 ENCOUNTER — Encounter: Payer: Self-pay | Admitting: Family Medicine

## 2020-09-14 ENCOUNTER — Other Ambulatory Visit: Payer: Self-pay

## 2020-09-14 VITALS — BP 130/60 | HR 86 | Temp 98.1°F | Ht <= 58 in | Wt 145.6 lb

## 2020-09-14 DIAGNOSIS — Z0000 Encounter for general adult medical examination without abnormal findings: Secondary | ICD-10-CM | POA: Diagnosis not present

## 2020-09-14 DIAGNOSIS — E785 Hyperlipidemia, unspecified: Secondary | ICD-10-CM | POA: Diagnosis not present

## 2020-09-14 DIAGNOSIS — I10 Essential (primary) hypertension: Secondary | ICD-10-CM | POA: Diagnosis not present

## 2020-09-14 DIAGNOSIS — Z87798 Personal history of other (corrected) congenital malformations: Secondary | ICD-10-CM

## 2020-09-14 LAB — CBC WITH DIFFERENTIAL/PLATELET
Basophils Absolute: 0 10*3/uL (ref 0.0–0.1)
Basophils Relative: 0.4 % (ref 0.0–3.0)
Eosinophils Absolute: 0 10*3/uL (ref 0.0–0.7)
Eosinophils Relative: 0.6 % (ref 0.0–5.0)
HCT: 39.3 % (ref 36.0–46.0)
Hemoglobin: 13.4 g/dL (ref 12.0–15.0)
Lymphocytes Relative: 38.9 % (ref 12.0–46.0)
Lymphs Abs: 1.6 10*3/uL (ref 0.7–4.0)
MCHC: 34.2 g/dL (ref 30.0–36.0)
MCV: 91.8 fl (ref 78.0–100.0)
Monocytes Absolute: 0.4 10*3/uL (ref 0.1–1.0)
Monocytes Relative: 9.2 % (ref 3.0–12.0)
Neutro Abs: 2 10*3/uL (ref 1.4–7.7)
Neutrophils Relative %: 50.9 % (ref 43.0–77.0)
Platelets: 166 10*3/uL (ref 150.0–400.0)
RBC: 4.27 Mil/uL (ref 3.87–5.11)
RDW: 13.6 % (ref 11.5–15.5)
WBC: 4 10*3/uL (ref 4.0–10.5)

## 2020-09-14 LAB — COMPREHENSIVE METABOLIC PANEL
ALT: 26 U/L (ref 0–35)
AST: 24 U/L (ref 0–37)
Albumin: 4.5 g/dL (ref 3.5–5.2)
Alkaline Phosphatase: 56 U/L (ref 39–117)
BUN: 14 mg/dL (ref 6–23)
CO2: 28 mEq/L (ref 19–32)
Calcium: 9.5 mg/dL (ref 8.4–10.5)
Chloride: 102 mEq/L (ref 96–112)
Creatinine, Ser: 0.72 mg/dL (ref 0.40–1.20)
GFR: 88.51 mL/min (ref >60.00)
Glucose, Bld: 92 mg/dL (ref 70–99)
Potassium: 3.9 mEq/L (ref 3.5–5.1)
Sodium: 139 mEq/L (ref 135–145)
Total Bilirubin: 1.2 mg/dL (ref 0.2–1.2)
Total Protein: 6.3 g/dL (ref 6.0–8.3)

## 2020-09-14 LAB — LIPID PANEL
Cholesterol: 162 mg/dL (ref 0–200)
HDL: 65.2 mg/dL (ref >39.00)
LDL Cholesterol: 83 mg/dL (ref 0–99)
NonHDL: 96.4
Total CHOL/HDL Ratio: 2
Triglycerides: 65 mg/dL (ref 0.0–149.0)
VLDL: 13 mg/dL (ref 0.0–40.0)

## 2020-09-14 NOTE — Progress Notes (Signed)
[name] DOB: [birth year] Encounter date: 09/14/2020  This is a 65 y.o. female who presents for complete physical   History of present illness/Additional concerns: shingles vaccine - wasn't covered for her in past; would get this if covered, AAA screening  Going upstairs feet tire out. Wears good supportive shoes at work.   HTN: lisinopril 20 mg daily, hctz 12.5 mg daily. Has been around 120-125/70-75 at home.   Hyperlipidemia: Crestor 20 mg daily - no aches/cramps with this.   IBS: sees Dr. Loletha Carrow; still up and down; no new concerns.last colonoscopy 05/19/2018  Osteoporosis: Prolia 60 mg stopped since she has been on for 6 years.   Follows with gyn: last mammogram 03/2020, bone density 02/2019 Last colonoscopy 04/2018; repeat due 05/2028  Has appointment with Dr. Pearline Cables coming up (dermatology).   Past Medical History:  Diagnosis Date  . B12 DEFICIENCY 02/09/2009   Qualifier: Diagnosis of  By: Ronnald Ramp RN, CGRN, Sheri    . Blood in stool   . COLONIC POLYPS, HYPERPLASTIC, HX OF 02/03/2009   Qualifier: Diagnosis of  By: Surface RN, Butch Penny    . DIVERTICULOSIS, COLON 02/03/2009   Qualifier: Diagnosis of  By: Varney Daily RN, Butch Penny    . Dyslipidemia 06/20/2012  . Fracture, intertrochanteric, left femur ([place]) 05/02/2012  . GERD (gastroesophageal reflux disease)   . Hearing loss    Bil/has hearing aids  . Hemorrhoids   . HEMORRHOIDS 02/03/2009   Qualifier: Diagnosis of  By: Varney Daily RN, Butch Penny    . Hypertension   . INFLAMMATORY BOWEL DISEASE - Followed by Dr. Sharlett Iles in GI 02/03/2009   Qualifier: Diagnosis of  By: Varney Daily RN, Butch Penny    . Osteopenia   . Turner's syndrome    was on Provera and Premarin and d/c this  at age 32yr  . Ulcerative colitis    Past Surgical History:  Procedure Laterality Date  . COLONOSCOPY  last 03/25/2013  . HERNIA REPAIR  2009   lt ing   . INCISION AND DRAINAGE  2004    lt thumb dog bite  . INTRAMEDULLARY (IM) NAIL INTERTROCHANTERIC  05/02/2012    Procedure: INTRAMEDULLARY (IM) NAIL INTERTROCHANTRIC;  Surgeon: Johnny Bridge, MD;  Location: [facility];  Service: Orthopedics;  Laterality: Left;  . ORIF FINGER FRACTURE  06/14/2011   Procedure: OPEN REDUCTION INTERNAL FIXATION (ORIF) METACARPAL (FINGER) FRACTURE;  Surgeon: Tennis Must, MD;  Location: [facility];  Service: Orthopedics;  Laterality: Right;  right ring  . POLYPECTOMY    . TONSILLECTOMY    . UPPER GASTROINTESTINAL ENDOSCOPY     No Known Allergies No outpatient medications have been marked as taking for the 09/14/20 encounter (Office Visit) with Caren Macadam, MD.   Current Facility-Administered Medications for the 09/14/20 encounter (Office Visit) with Caren Macadam, MD  Medication  . 0.9 %  sodium chloride infusion   Social History   Tobacco Use  . Smoking status: Never Smoker  . Smokeless tobacco: Never Used  Substance Use Topics  . Alcohol use: Yes    Alcohol/week: 1.0 standard drink    Types: 1 Glasses of wine per week    Comment: occ.   Family History  Problem Relation Age of Onset  . Lymphoma Mother        nhl-mantle cell  . Lymphoma Father        nhl  . Healthy Sister   . Melanoma Maternal Grandmother   . Heart disease Maternal Grandfather   . Ovarian cancer  Paternal Grandmother   . Pancreatic cancer Paternal Grandfather   . Colon cancer Neg Hx   . Esophageal cancer Neg Hx   . Stomach cancer Neg Hx   . Rectal cancer Neg Hx   . Breast cancer Neg Hx   . Colon polyps Neg Hx      Review of Systems  Constitutional: Negative for activity change, appetite change, chills, fatigue, fever and unexpected weight change.  HENT: Negative for congestion, ear pain, hearing loss, sinus pressure, sinus pain, sore throat and trouble swallowing.   Eyes: Negative for pain and visual disturbance.  Respiratory: Negative for cough, chest tightness, shortness of breath and wheezing.   Cardiovascular: Negative for chest pain, palpitations and leg  swelling.  Gastrointestinal: Negative for abdominal pain, blood in stool, constipation, diarrhea, nausea and vomiting.  Genitourinary: Negative for difficulty urinating and menstrual problem.  Musculoskeletal: Negative for arthralgias and back pain.  Skin: Negative for rash.  Neurological: Negative for dizziness, weakness, numbness and headaches.  Hematological: Negative for adenopathy. Does not bruise/bleed easily.  Psychiatric/Behavioral: Negative for sleep disturbance and suicidal ideas. The patient is not nervous/anxious.     CBC:  Lab Results  Component Value Date   WBC 4.0 11/12/2018   HGB 14.9 11/12/2018   HCT 44.5 11/12/2018   MCH 29.5 05/16/2013   MCH 30.8 05/07/2012   MCHC 33.5 11/12/2018   RDW 12.9 11/12/2018   PLT 180.0 11/12/2018   MPV 10.3 05/16/2013   CMP: Lab Results  Component Value Date   NA 139 05/18/2019   K 4.0 05/18/2019   CL 102 05/18/2019   CO2 30 05/18/2019   GLUCOSE 105 (H) 05/18/2019   BUN 23 05/18/2019   CREATININE 0.79 05/18/2019   GFRAA >90 05/07/2012   CALCIUM 9.6 05/18/2019   PROT 5.7 (L) 05/07/2012   BILITOT 0.9 05/07/2012   ALKPHOS 157 (H) 05/07/2012   ALT 96 (H) 05/07/2012   AST 80 (H) 05/07/2012   LIPID: Lab Results  Component Value Date   CHOL 163 11/12/2018   TRIG 69.0 11/12/2018   HDL 57.10 11/12/2018   LDLCALC 92 11/12/2018    Objective:  BP 130/60 (BP Location: Left Arm, Patient Position: Sitting, Cuff Size: Normal)   Pulse 86   Temp 98.1 F (36.7 C) (Oral)   Ht 4\' 8"  (1.422 m)   Wt 145 lb 9.6 oz (66 kg)   SpO2 96%   BMI 32.64 kg/m   Weight: 145 lb 9.6 oz (66 kg)   BP Readings from Last 3 Encounters:  09/14/20 130/60  08/28/19 140/82  12/26/18 118/70   Wt Readings from Last 3 Encounters:  09/14/20 145 lb 9.6 oz (66 kg)  05/25/20 146 lb 6.4 oz (66.4 kg)  08/28/19 145 lb (65.8 kg)    Physical Exam Constitutional:      General: She is not in acute distress.    Appearance: She is well-developed.  HENT:      Head: Normocephalic and atraumatic.     Right Ear: External ear normal.     Left Ear: External ear normal.     Mouth/Throat:     Pharynx: No oropharyngeal exudate.  Eyes:     Conjunctiva/sclera: Conjunctivae normal.     Pupils: Pupils are equal, round, and reactive to light.  Neck:     Thyroid: No thyromegaly.  Cardiovascular:     Rate and Rhythm: Normal rate and regular rhythm.     Heart sounds: Normal heart sounds. No murmur heard. No friction rub.  No gallop.   Pulmonary:     Effort: Pulmonary effort is normal.     Breath sounds: Normal breath sounds.  Abdominal:     General: Bowel sounds are normal. There is no distension.     Palpations: Abdomen is soft. There is no mass.     Tenderness: There is no abdominal tenderness. There is no guarding.     Hernia: No hernia is present.  Musculoskeletal:        General: No tenderness or deformity. Normal range of motion.     Cervical back: Normal range of motion and neck supple.  Lymphadenopathy:     Cervical: No cervical adenopathy.  Skin:    General: Skin is warm and dry.     Findings: No rash.  Neurological:     Mental Status: She is alert and oriented to person, place, and time.     Deep Tendon Reflexes: Reflexes normal.     Reflex Scores:      Tricep reflexes are 2+ on the right side and 2+ on the left side.      Bicep reflexes are 2+ on the right side and 2+ on the left side.      Brachioradialis reflexes are 2+ on the right side and 2+ on the left side.      Patellar reflexes are 2+ on the right side and 2+ on the left side. Psychiatric:        Speech: Speech normal.        Behavior: Behavior normal.        Thought Content: Thought content normal.     Assessment/Plan: There are no preventive care reminders to display for this patient. Health Maintenance reviewed.  1. Preventative health care She stays very active with her work.   2. Essential hypertension Well controlled. Continue with current medications.   - CBC with Differential/Platelet; Future - Comprehensive metabolic panel; Future  3. H/O Turner syndrome I think it would be reasonable for [place] to get baseline AAA  4. Dyslipidemia - Lipid panel; Future  Return in about 6 months (around 03/16/2021) for Chronic condition visit.  Micheline Rough, MD

## 2020-09-14 NOTE — Patient Instructions (Addendum)
*  call insurance to see if shingrix is covered; if it is covered; then you can make a nurse visit to get this done.   [facility] - [phone]

## 2020-09-16 ENCOUNTER — Other Ambulatory Visit (HOSPITAL_COMMUNITY): Payer: Self-pay

## 2020-09-16 MED FILL — Lisinopril Tab 30 MG: ORAL | 90 days supply | Qty: 90 | Fill #0 | Status: AC

## 2020-09-27 ENCOUNTER — Telehealth: Payer: Self-pay | Admitting: Family Medicine

## 2020-09-27 NOTE — Telephone Encounter (Signed)
Patient would like for Dr. Ethlyn Gallery or her medical assistant to give her a call back about her ultrasound tomorrow.

## 2020-09-28 ENCOUNTER — Ambulatory Visit (HOSPITAL_COMMUNITY)
Admission: RE | Admit: 2020-09-28 | Discharge: 2020-09-28 | Disposition: A | Discharge status: Home / Self Care | Payer: 59 | Source: Ambulatory Visit | Attending: Family Medicine | Admitting: Family Medicine

## 2020-09-28 ENCOUNTER — Other Ambulatory Visit: Payer: Self-pay

## 2020-09-28 DIAGNOSIS — Z0000 Encounter for general adult medical examination without abnormal findings: Secondary | ICD-10-CM | POA: Diagnosis not present

## 2020-09-28 DIAGNOSIS — Z87798 Personal history of other (corrected) congenital malformations: Secondary | ICD-10-CM | POA: Insufficient documentation

## 2020-09-28 NOTE — Telephone Encounter (Signed)
Patient informed of the message below.

## 2020-09-28 NOTE — Telephone Encounter (Signed)
We did discuss at her visit, I thought it was worthwhile to do a one-time screening since I had no prior heart evaluation and there is some increased risk of aortic aneurysm with Turner syndrome.  If this is negative, she should not need any further evaluation at this point.

## 2020-09-28 NOTE — Telephone Encounter (Signed)
Spoke with the pt and she stated she found it strange that a vascular US was ordered for today and she did not know the reason.  I advised the pt per the office notes on 4/27, Dr Ethlyn Gallery ordered a vascular US as a baseline due to her history of Turner syndrome.  Patient again stated she found this strange as she has never been told she needed one before but will have this done today at 10:30am.  Message sent to PCP.

## 2020-10-20 ENCOUNTER — Other Ambulatory Visit (HOSPITAL_COMMUNITY): Payer: Self-pay

## 2020-11-17 ENCOUNTER — Other Ambulatory Visit: Payer: Self-pay | Admitting: Family Medicine

## 2020-11-17 DIAGNOSIS — I10 Essential (primary) hypertension: Secondary | ICD-10-CM

## 2020-11-18 ENCOUNTER — Other Ambulatory Visit (HOSPITAL_COMMUNITY): Payer: Self-pay

## 2020-11-18 MED ORDER — ROSUVASTATIN CALCIUM 20 MG PO TABS
20.0000 mg | ORAL_TABLET | Freq: Every day | ORAL | 1 refills | Status: DC
  Filled 2020-11-18: qty 90, 90d supply, fill #0
  Filled 2021-03-08: qty 90, 90d supply, fill #1

## 2020-11-18 MED ORDER — HYDROCHLOROTHIAZIDE 12.5 MG PO CAPS
12.5000 mg | ORAL_CAPSULE | Freq: Every day | ORAL | 1 refills | Status: DC
  Filled 2020-11-18: qty 90, 90d supply, fill #0
  Filled 2021-03-08: qty 90, 90d supply, fill #1

## 2020-11-29 ENCOUNTER — Other Ambulatory Visit (HOSPITAL_COMMUNITY): Payer: Self-pay

## 2020-12-05 ENCOUNTER — Encounter: Payer: Self-pay | Admitting: Family Medicine

## 2020-12-05 ENCOUNTER — Other Ambulatory Visit: Payer: Self-pay

## 2020-12-05 ENCOUNTER — Other Ambulatory Visit (HOSPITAL_COMMUNITY): Payer: Self-pay

## 2020-12-05 ENCOUNTER — Ambulatory Visit: Payer: 59 | Admitting: Family Medicine

## 2020-12-05 VITALS — BP 128/70 | HR 71 | Resp 16 | Ht <= 58 in | Wt 138.1 lb

## 2020-12-05 DIAGNOSIS — G8929 Other chronic pain: Secondary | ICD-10-CM

## 2020-12-05 DIAGNOSIS — M7541 Impingement syndrome of right shoulder: Secondary | ICD-10-CM | POA: Diagnosis not present

## 2020-12-05 DIAGNOSIS — I10 Essential (primary) hypertension: Secondary | ICD-10-CM

## 2020-12-05 DIAGNOSIS — M25511 Pain in right shoulder: Secondary | ICD-10-CM

## 2020-12-05 MED ORDER — CELECOXIB 100 MG PO CAPS
100.0000 mg | ORAL_CAPSULE | Freq: Two times a day (BID) | ORAL | 0 refills | Status: DC
  Filled 2020-12-05: qty 30, 15d supply, fill #0

## 2020-12-05 NOTE — Patient Instructions (Signed)
A few things to remember from today's visit:  Impingement syndrome of right shoulder - Plan: Ambulatory referral to Physical Therapy  Chronic right shoulder pain - Plan: celecoxib (CELEBREX) 100 MG capsule  Essential hypertension Take Celebrex daily 2 times for 7 days. Monitor blood pressure at home. PT will be arranged. Please follow with your PCP is pain is not better with PT.  Do not use My Chart to request refills or for acute issues that need immediate attention.    Please be sure medication list is accurate. If a new problem present, please set up appointment sooner than planned today.

## 2020-12-05 NOTE — Progress Notes (Signed)
ACUTE VISIT Chief Complaint  Patient presents with   Shoulder Pain    Right, has been ongoing for a while but getting worse. Hard to raise arm up now.    HPI: [name] is a 65 y.o. right handed female with hx of HTN,HLD, and osteoporosis here today complaining of right shoulder pain as described above. Problem has been worse for the past few weeks. Achy like constant pain and intermittent sharp sometimes, 6/10, not radiated.  Exacerbated by movement, specially reaching up. Limitation of ROM. Alleviated by rest. Pain interferes with sleep. Negative for fever,cough, wheezing, SOB, joint edema or deformity,numbness,tingling, or skin rash.  No hx of recent trauma. Her job involves repetitive movements above head,pushing, and lifting. She is taking Tylenol.  No hx of CKD, CAD,or CHF. HTN on HCTZ 12.5 mg daily and Lisinopril 30 mg daily.  Lab Results  Component Value Date   CREATININE 0.72 09/14/2020   BUN 14 09/14/2020   NA 139 09/14/2020   K 3.9 09/14/2020   CL 102 09/14/2020   CO2 28 09/14/2020   Review of Systems  Constitutional:  Positive for activity change. Negative for chills.  HENT:  Negative for mouth sores, nosebleeds and sore throat.   Cardiovascular:  Negative for chest pain and palpitations.  Gastrointestinal:  Negative for abdominal pain, nausea and vomiting.  Musculoskeletal:  Negative for joint swelling and neck pain.  Skin:  Negative for color change and pallor.  Neurological:  Negative for syncope and weakness.  Psychiatric/Behavioral:  Positive for sleep disturbance. Negative for confusion.   Rest see pertinent positives and negatives per HPI.  Current Outpatient Medications on File Prior to Visit  Medication Sig Dispense Refill   Calcium Carbonate Antacid (CALCIUM CARBONATE PO) Take by mouth.     hydrochlorothiazide (MICROZIDE) 12.5 MG capsule Take 1 capsule (12.5 mg total) by mouth daily. 90 capsule 1   lisinopril (ZESTRIL) 30 MG tablet  TAKE 1 TABLET (30 MG TOTAL) BY MOUTH DAILY. 90 tablet 1   oxybutynin (DITROPAN XL) 15 MG 24 hr tablet TAKE 1 TABLET BY MOUTH EVERY 24 HOURS DAILY     rosuvastatin (CRESTOR) 20 MG tablet Take 1 tablet (20 mg total) by mouth daily. 90 tablet 1   tolterodine (DETROL LA) 4 MG 24 hr capsule Take 1 capsule (4 mg total) by mouth daily. 30 capsule 10   VITAMIN D PO Take 2,000 Units by mouth daily.     Current Facility-Administered Medications on File Prior to Visit  Medication Dose Route Frequency Provider Last Rate Last Admin   0.9 %  sodium chloride infusion  500 mL Intravenous Once Doran Stabler, MD       Past Medical History:  Diagnosis Date   B12 DEFICIENCY 02/09/2009   Qualifier: Diagnosis of  By: Ronnald Ramp RN, CGRN, Sheri     Blood in stool    COLONIC POLYPS, HYPERPLASTIC, HX OF 02/03/2009   Qualifier: Diagnosis of  By: Surface RN, [facility], COLON 02/03/2009   Qualifier: Diagnosis of  By: Varney Daily RN, Donna     Dyslipidemia 06/20/2012   Fracture, intertrochanteric, left femur ([place]) 05/02/2012   GERD (gastroesophageal reflux disease)    Hearing loss    Bil/has hearing aids   Hemorrhoids    HEMORRHOIDS 02/03/2009   Qualifier: Diagnosis of  By: Surface RN, Butch Penny     Hypertension    INFLAMMATORY BOWEL DISEASE - Followed by Dr. Sharlett Iles in GI 02/03/2009   Qualifier:  Diagnosis of  By: Surface RN, Pricilla Handler    Turner's syndrome    was on Provera and Premarin and d/c this  at age 19yr   Ulcerative colitis    No Known Allergies  Social History   Socioeconomic History   Marital status: Married    Spouse name: Not on file   Number of children: Not on file   Years of education: Not on file   Highest education level: Not on file  Occupational History   Not on file  Tobacco Use   Smoking status: Never   Smokeless tobacco: Never  Vaping Use   Vaping Use: Never used  Substance and Sexual Activity   Alcohol use: Yes    Alcohol/week: 1.0 standard drink     Types: 1 Glasses of wine per week    Comment: occ.   Drug use: No   Sexual activity: Not on file  Other Topics Concern   Not on file  Social History Narrative   Works at 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  Physical Exam Vitals and nursing note reviewed.  Constitutional:      General: She is not in acute distress.    Appearance: She is well-developed. She is not ill-appearing.  HENT:     Head: Normocephalic and atraumatic.  Eyes:     Conjunctiva/sclera: Conjunctivae normal.  Cardiovascular:     Rate and Rhythm: Normal rate and regular rhythm.     Pulses:          Radial pulses are 2+ on the right side.     Heart sounds: No murmur heard. Pulmonary:     Effort: Pulmonary effort is normal. No respiratory distress.  Musculoskeletal:     Right shoulder: Tenderness present. No deformity or bony tenderness. Decreased range of motion. Normal pulse.     Comments: Right shoulder: No deformity, edema, or erythema appreciated.No muscle atrophy. Luan Pulling' test pos, drop arm rotator cuff test pos, empty can supraspinatus test positive, lift-Off Subscapularis test positive. Active ROM mildly limited..   Skin:    General: Skin is warm.     Findings: No erythema or rash.  Neurological:     General: No focal deficit present.     Mental Status: She is alert and oriented to person, place, and time.     Gait: Gait normal.   ASSESSMENT AND PLAN:  [name] was seen today for shoulder pain.  Diagnoses and all orders for this visit:  Chronic right shoulder  pain Problem is getting worse. We discussed possible etiologies. After discussing some side effects she agrees with taking Celebrex 100 mg bid x 7 days then daily prn. I do not think imaging is needed today.  -     celecoxib (CELEBREX) 100 MG capsule; Take 1 capsule (100 mg total) by mouth 2 (two) times daily for 14 days.  Impingement syndrome of right shoulder Discussed treatment options.  I do not think subacromial injection is needed at this time. Celebrex 100 mg bid and PT recommended.  -     Ambulatory referral to Physical Therapy  Essential hypertension We discussed some side effects of NSAID's Instructed to monitor BP regularly while taking Celebrex.   Return if symptoms worsen or fail to improve.   Clotilda Hafer G. [place], MD  [facility]. [place] office.

## 2020-12-06 ENCOUNTER — Other Ambulatory Visit (HOSPITAL_COMMUNITY): Payer: Self-pay

## 2020-12-13 ENCOUNTER — Ambulatory Visit: Payer: Self-pay | Admitting: Physical Therapy

## 2020-12-13 ENCOUNTER — Other Ambulatory Visit: Payer: Self-pay

## 2020-12-13 ENCOUNTER — Encounter: Payer: Self-pay | Admitting: Physical Therapy

## 2020-12-13 ENCOUNTER — Ambulatory Visit: Payer: 59 | Attending: Family Medicine | Admitting: Physical Therapy

## 2020-12-13 DIAGNOSIS — M25511 Pain in right shoulder: Secondary | ICD-10-CM | POA: Diagnosis not present

## 2020-12-13 DIAGNOSIS — M25611 Stiffness of right shoulder, not elsewhere classified: Secondary | ICD-10-CM | POA: Insufficient documentation

## 2020-12-13 DIAGNOSIS — M6281 Muscle weakness (generalized): Secondary | ICD-10-CM | POA: Diagnosis not present

## 2020-12-13 NOTE — Patient Instructions (Signed)
Access Code: [email or web address] URL: [email or web address] Date: 12/13/2020 Prepared by: Ruben Im  Exercises Supine Shoulder Flexion Extension AAROM with Dowel - 1 x daily - 7 x weekly - 1 sets - 10 reps Seated Scapular Retraction - 1 x daily - 7 x weekly - 1 sets - 10 reps Seated Thoracic Lumbar Extension - 1 x daily - 7 x weekly - 1 sets - 10 reps

## 2020-12-13 NOTE — Therapy (Signed)
[facility] [address] [place], [place], [address] Phone: [phone]   Fax:  [phone]  Physical Therapy Evaluation  Patient Details  Name: [name] MRN: [id number] Date of Birth: 19-Mar-1956 Referring Provider (PT): Dr. Betty Martinique   Encounter Date: 12/13/2020   PT End of Session - 12/13/20 1939     Visit Number 1    Date for PT Re-Evaluation 02/07/21    Authorization Type [employer] employee    PT Start Time 1530    PT Stop Time 1610    PT Time Calculation (min) 40 min    Activity Tolerance Patient tolerated treatment well             Past Medical History:  Diagnosis Date   B12 DEFICIENCY 02/09/2009   Qualifier: Diagnosis of  By: Ronnald Ramp RN, CGRN, Sheri     Blood in stool    COLONIC POLYPS, HYPERPLASTIC, HX OF 02/03/2009   Qualifier: Diagnosis of  By: Surface RN, Brent General, COLON 02/03/2009   Qualifier: Diagnosis of  By: Varney Daily RN, Donna     Dyslipidemia 06/20/2012   Fracture, intertrochanteric, left femur ([place]) 05/02/2012   GERD (gastroesophageal reflux disease)    Hearing loss    Bil/has hearing aids   Hemorrhoids    HEMORRHOIDS 02/03/2009   Qualifier: Diagnosis of  By: Surface RN, Butch Penny     Hypertension    INFLAMMATORY BOWEL DISEASE - Followed by Dr. 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 and d/c this  at age 7yr  Ulcerative colitis     Past Surgical History:  Procedure Laterality Date   COLONOSCOPY  last 03/25/2013   [place]ERNIA REPAIR  2009   lt [place]    [place]    [place] thumb [place]   INTRAMEDULLARY (IM) NAIL INTERTROCHANTERIC  05/02/2012   Procedure: INTRAMEDULLARY (IM) NAIL INTERTROCHANTRIC;  Surgeon: JJohnny Bridge MD;  [facility] [facility] [place]  Service: Orthopedics;  Laterality: Left;   ORIF FINGER FRACTURE  06/14/2011   Procedure: OPEN REDUCTION INTERNAL  FIXATION (ORIF) METACARPAL (FINGER) FRACTURE;  Surgeon: KTennis Must MD;  [place] [facility]  Service: Orthopedics;  Laterality: Right;  right ring   POLYPECTOMY     TONSILLECTOMY     UPPER GASTROINTESTINAL ENDOSCOPY      There were no vitals filed for this visit.    Subjective Assessment - 12/13/20 1533     Subjective Started a while ago;  at night I would feel it at the computer 1 1/2 months ago.  Discomfort with reaching up and behind back.  Hard to take arm out of sleeve.   Pain at night.    Pertinent History Right hand dominant;  retiring Jul 13, 2021;  hip fracture 10 years ago    Limitations House hold activities    Diagnostic tests none    Patient Stated Goals pain free; better movement; no surgery    Currently in Pain? Yes    Pain Location Shoulder    Pain Orientation Right;Upper    Pain Type Acute pain    Pain Frequency Intermittent    Aggravating Factors  raising arm up; putting pressure on it (holding a tray); body weight on it    Pain Relieving Factors movement sometimes;  Celebrex helped  [facility] PT Assessment - 12/13/20 0001       Assessment   Medical Diagnosis impingement syndrome right shoulder    Referring Provider (PT) Dr. Betty Martinique    Onset Date/Surgical Date --   1 1/2 months ago   Hand Dominance Right    Next MD Visit as needed    Prior Therapy for hip 10 years ago      Precautions   Precautions None      Restrictions   Weight Bearing Restrictions No      Balance Screen   Has the patient fallen in the past 6 months No    Has the patient had a decrease in activity level because of a fear of falling?  No    Is the patient reluctant to leave their home because of a fear of falling?  No      Home Ecologist residence    Living Arrangements Spouse/significant other    Additional Comments takes care of husband who has Parkinsons   I cut up his food     Prior Function   Level of  Independence Independent    Vocation Full time employment    Vocation Requirements dietary services push/pull carts; carrying trays;  sometimes takes orders    Leisure go out to dinner with friends, go to shows; do games on computer      Observation/Other Assessments   Focus on Therapeutic Outcomes (FOTO)  45%      AROM   Overall AROM Comments decreased thoracic extension; difficulty scapular retraction/depression    Right Shoulder Flexion 116 Degrees    Right Shoulder ABduction 132 Degrees    Right Shoulder Internal Rotation --   L5 painful   Right Shoulder External Rotation 60 Degrees    Left Shoulder Flexion 157 Degrees    Left Shoulder ABduction 156 Degrees    Left Shoulder Internal Rotation --   T8   Left Shoulder External Rotation 60 Degrees      Strength   Overall Strength Comments left 5/5; biceps 5/5    Right Shoulder Flexion 3/5    Right Shoulder Extension 4/5    Right Shoulder ABduction 3-/5    Right Shoulder Internal Rotation 3+/5    Right Shoulder External Rotation 3+/5      Palpation   Palpation comment no bursal tenderness      Hawkins-Kennedy test   Findings Positive    Side Right      Empty Can test   Findings Positive    Side Right      Drop Arm test   Findings Positive    Side Right                        Objective measurements completed on examination: See above findings.               PT Education - 12/13/20 1939     Education Details supine dowel assisted flexion; seated scap retractions; seated thoracic extension    Person(s) Educated Patient    Methods Explanation;Demonstration;Handout    Comprehension Returned demonstration;Verbalized understanding              PT Short Term Goals - 12/13/20 1958       PT SHORT TERM GOAL #1   Title The patient will demonstrate knowledge of basic self care strategies and ex's to promote ROM and improved function    Time 4  Period Weeks    Status New    Target Date  01/10/21      PT SHORT TERM GOAL #2   Title The patient will report a 30% improvement in right shoulder pain with sleep, dressing and reaching for a higher shelf    Time 4    Period Weeks    Status New      PT SHORT TERM GOAL #3   Title The patient will have improved right shoulder flexion/scaption to 130 needed for reaching overhead    Time 4    Period Weeks    Status New      PT SHORT TERM GOAL #4   Title The patient will have improved internal rotation to L1 needed for greater ease with dressing tasks    Time 4    Period Weeks    Status New               PT Long Term Goals - 12/13/20 2001       PT LONG TERM GOAL #1   Title The patient will be independent with safe self progression of HEP    Time 8    Period Weeks    Status New    Target Date 02/07/21      PT LONG TERM GOAL #2   Title The patient will have improved flexion/scaption to 150 degrees needed for reaching overhead with greater ease    Time 8    Period Weeks    Status New      PT LONG TERM GOAL #3   Title The patient will have grossly 4-/5 to 4/5  shoulder/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    Time 8    Period Weeks    Status New      PT LONG TERM GOAL #4   Title The patient will be able to lift a 3# object down from a higher shelf    Time 8    Period Weeks    Status New      PT LONG TERM GOAL #5   Title The patient will report a 60% improvement in pain at night and with usual home and work ADLs    Time 8    Period Weeks    Status New      Additional Long Term Goals   Additional Long Term Goals Yes      PT LONG TERM GOAL #6   Title FOTO score improved from 45% to 65%    Time 8    Period [place] - 12/13/20 1940     Clinical Impression Statement The patient reports a month and a half history of increasing right shoulder pain and lack of mobility which started for no apparent reason.  She is right hand dominant.    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  Elevating her arm is painful as well as reaching behind her back.  Some sleep disruption.    Her ROM is painful and limited in all planes: flexion 116 degrees, abduction 132, internal rotation to L5 and external rotation 60 degrees. Limited thoracic extension.  Decreased strength: particularly abduction 3-/5.  Poor scapular control with difficulty activating retractors and depressors.    +  Michel Bickers and empty can test.    Personal Factors and Comorbidities Comorbidity 1;Comorbidity 2    Comorbidities osteoporosis; HTN    Examination-Activity Limitations Reach Overhead;Caring for Others;Dressing;Lift;Sleep   assists husband who has Parkinsons   Examination-Participation Restrictions Meal Prep;Cleaning;Occupation    Stability/Clinical Decision Making Stable/Uncomplicated    Clinical Decision Making Low    Rehab Potential Good    PT Frequency 2x / week    PT Duration 8 weeks    PT Treatment/Interventions ADLs/Self Care Home Management;Aquatic Therapy;Cryotherapy;Electrical Stimulation;Ultrasound;Moist Heat;Iontophoresis '4mg'$ /ml Dexamethasone;Therapeutic activities;Therapeutic exercise;Manual techniques;Patient/family education;Dry needling;Taping;Vasopneumatic Device    PT Next Visit Plan right shoulder assisted ROM; low level strengthening start with rows and shoulder extension initially;  thoracic extension; scapular mobility;  ionto if cert signed    Consulted and Agree with Plan of Care Patient             Patient will benefit from skilled therapeutic intervention in order to improve the following deficits and impairments:  Decreased range of motion, Impaired UE functional use, Pain, Decreased strength  Visit Diagnosis: Acute pain of right shoulder - Plan: PT plan of care cert/re-cert  Stiffness of right shoulder, not  elsewhere classified - Plan: PT plan of care cert/re-cert  Muscle weakness (generalized) - Plan: PT plan of care cert/re-cert     Problem List Patient Active Problem List   Diagnosis Date Noted   Osteopenia/?Osteoporosis - followed by her gynecologist, Dr. Pamala Hurry 06/20/2012   Dyslipidemia 06/20/2012   H/O Turner syndrome 05/02/2012   Essential hypertension 02/03/2009   INFLAMMATORY BOWEL DISEASE - Followed by GI 02/03/2009   COLONIC POLYPS, HYPERPLASTIC, HX OF 02/03/2009   Ruben Im, PT 12/13/20 8:08 PM Phone: [phone] Fax: [phone]  Alvera Singh 12/13/2020, 8:07 PM  [facility] [address], [place], [place], [address] Phone: [phone]   Fax:  [phone]  Name: [name] MRN: [id number] Date of Birth: 20-Aug-1955

## 2020-12-21 ENCOUNTER — Other Ambulatory Visit (HOSPITAL_COMMUNITY): Payer: Self-pay

## 2020-12-22 ENCOUNTER — Other Ambulatory Visit: Payer: Self-pay

## 2020-12-22 ENCOUNTER — Ambulatory Visit: Payer: 59 | Attending: Family Medicine | Admitting: Physical Therapy

## 2020-12-22 DIAGNOSIS — R252 Cramp and spasm: Secondary | ICD-10-CM | POA: Insufficient documentation

## 2020-12-22 DIAGNOSIS — R2689 Other abnormalities of gait and mobility: Secondary | ICD-10-CM | POA: Diagnosis not present

## 2020-12-22 DIAGNOSIS — M6281 Muscle weakness (generalized): Secondary | ICD-10-CM | POA: Diagnosis not present

## 2020-12-22 DIAGNOSIS — M25511 Pain in right shoulder: Secondary | ICD-10-CM | POA: Diagnosis not present

## 2020-12-22 DIAGNOSIS — R293 Abnormal posture: Secondary | ICD-10-CM | POA: Insufficient documentation

## 2020-12-22 DIAGNOSIS — G8929 Other chronic pain: Secondary | ICD-10-CM | POA: Insufficient documentation

## 2020-12-22 NOTE — Therapy (Signed)
[facility] [address] [place], [place], [address] Phone: [phone]   Fax:  [phone]  Physical Therapy Treatment  Patient Details  Name: [name] MRN: [id number] Date of Birth: 04/24/1956 Referring Provider (PT): Dr. Betty Martinique   Encounter Date: 12/22/2020   PT End of Session - 12/22/20 1602     Visit Number 2    Date for PT Re-Evaluation 02/07/21    Authorization Type [employer] employee    PT Start Time 1531    PT Stop Time 1610    PT Time Calculation (min) 39 min    Activity Tolerance Patient tolerated treatment well    Behavior During Therapy [facility] for tasks assessed/performed             Past Medical History:  Diagnosis Date   B12 DEFICIENCY 02/09/2009   Qualifier: Diagnosis of  By: Ronnald Ramp RN, CGRN, Sheri     Blood in stool    COLONIC POLYPS, HYPERPLASTIC, HX OF 02/03/2009   Qualifier: Diagnosis of  By: Surface RN, Brent General, COLON 02/03/2009   Qualifier: Diagnosis of  By: Varney Daily RN, Donna     Dyslipidemia 06/20/2012   Fracture, intertrochanteric, left femur ([place]) 05/02/2012   GERD (gastroesophageal reflux disease)    Hearing loss    Bil/has hearing aids   Hemorrhoids    HEMORRHOIDS 02/03/2009   Qualifier: Diagnosis of  By: Surface RN, Butch Penny     Hypertension    INFLAMMATORY BOWEL DISEASE - Followed by Dr. 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 and d/c this  at age 89yr  Ulcerative colitis     Past Surgical History:  Procedure Laterality Date   COLONOSCOPY  last 03/25/2013   HERNIA REPAIR  2009   lt ing    [place]    lt thumb dog bite   INTRAMEDULLARY (IM) NAIL INTERTROCHANTERIC  05/02/2012   Procedure: INTRAMEDULLARY (IM) NAIL INTERTROCHANTRIC;  Surgeon: JJohnny Bridge MD;  [place]  Service: Orthopedics;  Laterality: Left;   ORIF FINGER  FRACTURE  06/14/2011   Procedure: OPEN REDUCTION INTERNAL FIXATION (ORIF) METACARPAL (FINGER) FRACTURE;  Surgeon: KTennis Must MD;  [place] [facility]  Service: Orthopedics;  Laterality: Right;  right ring   POLYPECTOMY     TONSILLECTOMY     UPPER GASTROINTESTINAL ENDOSCOPY      There were no vitals filed for this visit.   Subjective Assessment - 12/22/20 1533     Subjective Pt reports she feels pain when pushing carts forward however when taking orders and less weight to push she has much less pain in Rt shoulder.    Pertinent History Right hand dominant;  retiring Jul 13, 2021;  hip fracture 10 years ago    Limitations House hold activities    Diagnostic tests none    Patient Stated Goals pain free; better movement; no surgery    Currently in Pain? No/denies                               [facility]ercise - 12/22/20 0001       Exercises   Exercises Shoulder;Neck      Neck Exercises: Machines for Strengthening   UBE (Upper  Arm Bike) L1.1 for 3 mins forward and 3 mins backward      Neck Exercises: Seated   Shoulder Rolls Backwards;Forwards;10 reps      Shoulder Exercises: Supine   Horizontal ABduction AAROM;Both;20 reps   2x10   Horizontal ABduction Limitations SPC at 90 deg shoulder flexion with Rt and Lt horizontal Abduction    Flexion AAROM;Both;20 reps   2x10   Flexion Limitations SPC for self ass with range of motion    ABduction --   2x10     Shoulder Exercises: Seated   Row Strengthening;Both;20 reps   2x10   Theraband Level (Shoulder Row) Level 3 (Green)    Horizontal ABduction Strengthening;Both;20 reps   2x10   Theraband Level (Shoulder Horizontal ABduction) Level 3 (Green)    External Rotation Strengthening;Both;20 reps;Weights   2x10   External Rotation Weight (lbs) 2# in the Rt and 3# in the Lt    Internal Rotation Strengthening;Both;20 reps   2x10   Internal Rotation Limitations 2# in the Rt and 3# in  the Lt    Flexion Strengthening;Both;20 reps    Flexion Weight (lbs) 2# in the Rt and 3# in the Lt                    PT Education - 12/22/20 1548     Education Details Pt educated on all exercises with proper technique    Person(s) Educated Patient    Methods Explanation;Demonstration;Tactile cues;Verbal cues    Comprehension Verbalized understanding;Returned demonstration              PT Short Term Goals - 12/13/20 1958       PT SHORT TERM GOAL #1   Title The patient will demonstrate knowledge of basic self care strategies and ex's to promote ROM and improved function    Time 4    Period Weeks    Status New    Target Date 01/10/21      PT SHORT TERM GOAL #2   Title The patient will report a 30% improvement in right shoulder pain with sleep, dressing and reaching for a higher shelf    Time 4    Period Weeks    Status New      PT SHORT TERM GOAL #3   Title The patient will have improved right shoulder flexion/scaption to 130 needed for reaching overhead    Time 4    Period Weeks    Status New      PT SHORT TERM GOAL #4   Title The patient will have improved internal rotation to L1 needed for greater ease with dressing tasks    Time 4    Period Weeks    Status New               PT Long Term Goals - 12/13/20 2001       PT LONG TERM GOAL #1   Title The patient will be independent with safe self progression of HEP    Time 8    Period Weeks    Status New    Target Date 02/07/21      PT LONG TERM GOAL #2   Title The patient will have improved flexion/scaption to 150 degrees needed for reaching overhead with greater ease    Time 8    Period Weeks    Status New      PT LONG TERM GOAL #3   Title The patient will have grossly 4-/5 to 4/5  shoulder/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    Time 8    Period Weeks    Status New      PT LONG TERM GOAL #4   Title The patient will be able to lift  a 3# object down from a higher shelf    Time 8    Period Weeks    Status New      PT LONG TERM GOAL #5   Title The patient will report a 60% improvement in pain at night and with usual home and work ADLs    Time 8    Period Weeks    Status New      Additional Long Term Goals   Additional Long Term Goals Yes      PT LONG TERM GOAL #6   Title FOTO score improved from 45% to 65%    Time 8    Period Weeks    Status New                   Plan - 12/22/20 1602     Clinical Impression Statement Pt presents to clinic with reports of only mild pain in Rt shoulder, dependent    Personal Factors and Comorbidities Comorbidity 1;Comorbidity 2    Comorbidities osteoporosis; HTN    Examination-Activity Limitations Reach Overhead;Caring for Others;Dressing;Lift;Sleep    Examination-Participation Restrictions Meal Prep;Cleaning;Occupation    Stability/Clinical Decision Making Stable/Uncomplicated    Clinical Decision Making Low    Rehab Potential Good    PT Frequency 2x / week    PT Duration 8 weeks    PT Treatment/Interventions ADLs/Self Care Home Management;Aquatic Therapy;Cryotherapy;Electrical Stimulation;Ultrasound;Moist Heat;Iontophoresis '4mg'$ /ml Dexamethasone;Therapeutic activities;Therapeutic exercise;Manual techniques;Patient/family education;Dry needling;Taping;Vasopneumatic Device    PT Next Visit Plan right shoulder assisted ROM; low level strengthening start with rows and shoulder extension initially;  thoracic extension; scapular mobility;  ionto if cert signed    Consulted and Agree with Plan of Care Patient             Patient will benefit from skilled therapeutic intervention in order to improve the following deficits and impairments:  Decreased range of motion, Impaired UE functional use, Pain, Decreased strength  Visit Diagnosis: Chronic right shoulder pain  Muscle weakness (generalized)  Other abnormalities of gait and mobility     Problem  List Patient Active Problem List   Diagnosis Date Noted   Osteopenia/?Osteoporosis - followed by her gynecologist, Dr. Pamala Hurry 06/20/2012   Dyslipidemia 06/20/2012   H/O Turner syndrome 05/02/2012   Essential hypertension 02/03/2009   INFLAMMATORY BOWEL DISEASE - Followed by GI 02/03/2009   COLONIC POLYPS, HYPERPLASTIC, HX OF 02/03/2009   Stacy Gardner, PT 08/04/224:18 PM   [facility] [address] [place], [place], [address] Phone: [phone]   Fax:  [phone]  Name: PASSION MASHAK MRN: [id number] Date of Birth: 08-Jul-1955

## 2020-12-26 ENCOUNTER — Other Ambulatory Visit: Payer: Self-pay

## 2020-12-26 ENCOUNTER — Telehealth: Payer: Self-pay

## 2020-12-26 ENCOUNTER — Ambulatory Visit: Payer: 59

## 2020-12-26 ENCOUNTER — Other Ambulatory Visit: Payer: Self-pay | Admitting: Family Medicine

## 2020-12-26 ENCOUNTER — Other Ambulatory Visit (HOSPITAL_COMMUNITY): Payer: Self-pay

## 2020-12-26 DIAGNOSIS — G8929 Other chronic pain: Secondary | ICD-10-CM | POA: Diagnosis not present

## 2020-12-26 DIAGNOSIS — M25511 Pain in right shoulder: Secondary | ICD-10-CM

## 2020-12-26 DIAGNOSIS — R252 Cramp and spasm: Secondary | ICD-10-CM | POA: Diagnosis not present

## 2020-12-26 DIAGNOSIS — R2689 Other abnormalities of gait and mobility: Secondary | ICD-10-CM | POA: Diagnosis not present

## 2020-12-26 DIAGNOSIS — R293 Abnormal posture: Secondary | ICD-10-CM | POA: Diagnosis not present

## 2020-12-26 DIAGNOSIS — M6281 Muscle weakness (generalized): Secondary | ICD-10-CM | POA: Diagnosis not present

## 2020-12-26 MED ORDER — CELECOXIB 100 MG PO CAPS
100.0000 mg | ORAL_CAPSULE | Freq: Two times a day (BID) | ORAL | 0 refills | Status: DC | PRN
  Filled 2020-12-26 (×2): qty 60, 30d supply, fill #0

## 2020-12-26 NOTE — Telephone Encounter (Signed)
Refill sent to pharmacy. Take with food to avoid stomach upset.

## 2020-12-26 NOTE — Telephone Encounter (Signed)
Patient called stating she is out of Celecoxib 100 mg and would like a refill pt stated she has physical therapy and would like to continue taking Rx

## 2020-12-26 NOTE — Therapy (Signed)
[facility] [address] [place], [place], [address] Phone: [phone]   Fax:  [phone]  Physical Therapy Treatment  Patient Details  Name: [name] MRN: [id number] Date of Birth: 10/02/1955 Referring Provider (PT): Dr. Betty Martinique   Encounter Date: 12/26/2020   PT End of Session - 12/26/20 1101     Visit Number 3    Date for PT Re-Evaluation 02/07/21    Authorization Type [employer] employee    PT Start Time [id number]    PT Stop Time 1101    PT Time Calculation (min) 40 min    Activity Tolerance Patient tolerated treatment well    Behavior During Therapy [facility] for tasks assessed/performed             Past Medical History:  Diagnosis Date   B12 DEFICIENCY 02/09/2009   Qualifier: Diagnosis of  By: Ronnald Ramp RN, CGRN, Sheri     Blood in stool    COLONIC POLYPS, HYPERPLASTIC, HX OF 02/03/2009   Qualifier: Diagnosis of  By: Surface RN, Brent General, COLON 02/03/2009   Qualifier: Diagnosis of  By: Varney Daily RN, Donna     Dyslipidemia 06/20/2012   Fracture, intertrochanteric, left femur ([place]) 05/02/2012   GERD (gastroesophageal reflux disease)    Hearing loss    Bil/has hearing aids   Hemorrhoids    HEMORRHOIDS 02/03/2009   Qualifier: Diagnosis of  By: Surface RN, Butch Penny     Hypertension    INFLAMMATORY BOWEL DISEASE - Followed by Dr. 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 and d/c this  at age 24yr  Ulcerative colitis     Past Surgical History:  Procedure Laterality Date   COLONOSCOPY  last 03/25/2013   HERNIA REPAIR  2009   lt ing    [place] 2004    lt thumb dog bite   INTRAMEDULLARY (IM) NAIL INTERTROCHANTERIC  05/02/2012   Procedure: INTRAMEDULLARY (IM) NAIL INTERTROCHANTRIC;  Surgeon: JJohnny Bridge MD;  [place]  Service: Orthopedics;  Laterality: Left;   ORIF FINGER  FRACTURE  06/14/2011   Procedure: OPEN REDUCTION INTERNAL FIXATION (ORIF) METACARPAL (FINGER) FRACTURE;  Surgeon: KTennis Must MD;  [place]  Service: Orthopedics;  Laterality: Right;  right ring   POLYPECTOMY     TONSILLECTOMY     UPPER GASTROINTESTINAL ENDOSCOPY      There were no vitals filed for this visit.   Subjective Assessment - 12/26/20 1025     Subjective I feel about the same. I have pain at night.    Pertinent History Right hand dominant;  retiring Jul 13, 2021;  hip fracture 10 years ago    Patient Stated Goals pain free; better movement; no surgery    Currently in Pain? No/denies                [facility]       AROM   Right Shoulder Flexion 127 Degrees                           [facility] Adult PT Treatment/Exercise - 12/26/20 0001       Neck Exercises: Machines for Strengthening   UBE (Upper Arm Bike) L1.1 for 3 mins forward and  3 mins backward- PT present to discuss progress with pt      Shoulder Exercises: Standing   Extension Strengthening;Both;20 reps;Theraband    Theraband Level (Shoulder Extension) Level 3 (Green)    Row [employer];Theraband    Theraband Level (Shoulder Row) Level 3 (Green)      Shoulder Exercises: Pulleys   Flexion 3 minutes      Shoulder Exercises: ROM/Strengthening   Ranger seated flexion 2x10    Other ROM/Strengthening Exercises finger ladder Rt x 10, abduction x10                      PT Short Term Goals - 12/26/20 1034       PT SHORT TERM GOAL #1   Title The patient will demonstrate knowledge of basic self care strategies and ex's to promote ROM and improved function    Status Achieved      PT SHORT TERM GOAL #2   Title The patient will report a 30% improvement in right shoulder pain with sleep, dressing and reaching for a higher shelf    Status On-going               PT Long Term Goals - 12/13/20 2001       PT  LONG TERM GOAL #1   Title The patient will be independent with safe self progression of HEP    Time 8    Period Weeks    Status New    Target Date 02/07/21      PT LONG TERM GOAL #2   Title The patient will have improved flexion/scaption to 150 degrees needed for reaching overhead with greater ease    Time 8    Period Weeks    Status New      PT LONG TERM GOAL #3   Title The patient will have grossly 4-/5 to 4/5  shoulder/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    Time 8    Period Weeks    Status New      PT LONG TERM GOAL #4   Title The patient will be able to lift a 3# object down from a higher shelf    Time 8    Period Weeks    Status New      PT LONG TERM GOAL #5   Title The patient will report a 60% improvement in pain at night and with usual home and work ADLs    Time 8    Period Weeks    Status New      Additional Long Term Goals   Additional Long Term Goals Yes      PT LONG TERM GOAL #6   Title FOTO score improved from 45% to 65%    Time 8    Period Weeks    Status New                   Plan - 12/26/20 1043     Clinical Impression Statement Pt with continued Rt shoulder pain that is worse at night, up to 7-8/10.  Pt denies any change in this since the start of care.  Pt has not been compliant with HEP due to care of her husband at home when she is not working.  Pt did well with exercise today in the clinic and required minor tactile cueing for scapular depression.  Rt shoulder A/ROM is improved to 127 degrees today. Pt  will continue to benefit from skilled PT to address Rt shoulder strength, flexibility and use of Rt UE and improve sleep.    PT Frequency 2x / week    PT Duration 8 weeks    PT Treatment/Interventions ADLs/Self Care Home Management;Aquatic Therapy;Cryotherapy;Electrical Stimulation;Ultrasound;Moist Heat;Iontophoresis '4mg'$ /ml Dexamethasone;Therapeutic activities;Therapeutic exercise;Manual  techniques;Patient/family education;Dry needling;Taping;Vasopneumatic Device    PT Next Visit Plan right shoulder assisted ROM; low level strengthening start with rows and shoulder extension initially;  thoracic extension; scapular mobility, ionto (order is signed.  Pt declined today because she wants to swim today)    PT Home Exercise Plan [id number]    Recommended Other Services initial cert is signed    Consulted and Agree with Plan of Care Patient             Patient will benefit from skilled therapeutic intervention in order to improve the following deficits and impairments:  Decreased range of motion, Impaired UE functional use, Pain, Decreased strength  Visit Diagnosis: Chronic right shoulder pain  Muscle weakness (generalized)  Other abnormalities of gait and mobility     Problem List Patient Active Problem List   Diagnosis Date Noted   Osteopenia/?Osteoporosis - followed by her gynecologist, Dr. Pamala Hurry 06/20/2012   Dyslipidemia 06/20/2012   H/O Turner syndrome 05/02/2012   Essential hypertension 02/03/2009   INFLAMMATORY BOWEL DISEASE - Followed by GI 02/03/2009   COLONIC POLYPS, HYPERPLASTIC, HX OF 02/03/2009    Sigurd Sos, PT 12/26/20 11:05 AM   [facility] [address] [place], [place], [address] Phone: [phone]   Fax:  [phone]  Name: [name] MRN: [id number] Date of Birth: 06/15/1955

## 2020-12-27 ENCOUNTER — Other Ambulatory Visit (HOSPITAL_COMMUNITY): Payer: Self-pay

## 2020-12-27 ENCOUNTER — Ambulatory Visit: Payer: 59 | Admitting: Physical Therapy

## 2020-12-27 DIAGNOSIS — M6281 Muscle weakness (generalized): Secondary | ICD-10-CM

## 2020-12-27 DIAGNOSIS — R293 Abnormal posture: Secondary | ICD-10-CM | POA: Diagnosis not present

## 2020-12-27 DIAGNOSIS — R252 Cramp and spasm: Secondary | ICD-10-CM | POA: Diagnosis not present

## 2020-12-27 DIAGNOSIS — R2689 Other abnormalities of gait and mobility: Secondary | ICD-10-CM

## 2020-12-27 DIAGNOSIS — M25511 Pain in right shoulder: Secondary | ICD-10-CM | POA: Diagnosis not present

## 2020-12-27 DIAGNOSIS — G8929 Other chronic pain: Secondary | ICD-10-CM | POA: Diagnosis not present

## 2020-12-27 NOTE — Therapy (Signed)
[facility] [address] [place], [place], [address] Phone: [phone]   Fax:  [phone]  Physical Therapy Treatment  Patient Details  Name: [name] MRN: [id number] Date of Birth: [birth year] Referring Provider (PT): Dr. Betty Martinique   Encounter Date: 12/27/2020   PT End of Session - 12/27/20 1612     Visit Number 4    Date for PT Re-Evaluation 02/07/21    Authorization Type [employer] employee    PT Start Time 1533    PT Stop Time [id number]    PT Time Calculation (min) 42 min    Activity Tolerance Patient tolerated treatment well    Behavior During Therapy [facility] for tasks assessed/performed             Past Medical History:  Diagnosis Date   B12 DEFICIENCY 02/09/2009   Qualifier: Diagnosis of  By: Ronnald Ramp RN, CGRN, Sheri     Blood in stool    COLONIC POLYPS, HYPERPLASTIC, HX OF 02/03/2009   Qualifier: Diagnosis of  By: Surface RN, Brent General, COLON 02/03/2009   Qualifier: Diagnosis of  By: Varney Daily RN, Donna     Dyslipidemia 06/20/2012   Fracture, intertrochanteric, left femur ([place]) 05/02/2012   GERD (gastroesophageal reflux disease)    Hearing loss    Bil/has hearing aids   Hemorrhoids    HEMORRHOIDS 02/03/2009   Qualifier: Diagnosis of  By: Surface RN, Butch Penny     Hypertension    INFLAMMATORY BOWEL DISEASE - Followed by Dr. 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 and d/c this  at age 65yr  Ulcerative colitis     Past Surgical History:  Procedure Laterality Date   COLONOSCOPY  last 03/25/2013   HERNIA REPAIR  2009   lt ing    [place] 2004    lt thumb dog bite   INTRAMEDULLARY (IM) NAIL INTERTROCHANTERIC  05/02/2012   Procedure: INTRAMEDULLARY (IM) NAIL INTERTROCHANTRIC;  Surgeon: JJohnny Bridge MD;  [place]  Service: Orthopedics;  Laterality: Left;   ORIF FINGER  FRACTURE  06/14/2011   Procedure: OPEN REDUCTION INTERNAL FIXATION (ORIF) METACARPAL (FINGER) FRACTURE;  Surgeon: KTennis Must MD;  [place]  Service: Orthopedics;  Laterality: Right;  right ring   POLYPECTOMY     TONSILLECTOMY     UPPER GASTROINTESTINAL ENDOSCOPY      There were no vitals filed for this visit.   Subjective Assessment - 12/27/20 1612     Subjective I feel about the same. I have pain at night.    Pertinent History Right hand dominant;  retiring Jul 13, 2021;  hip fracture 10 years ago    Limitations House hold activities    Diagnostic tests none    Patient Stated Goals pain free; better movement; no surgery    Currently in Pain? No/denies                               [facility] - 12[facility]27[facility] 0001       Exercises   Exercises Shoulder;Neck      Neck Exercises: Machines for Strengthening   UBE (Upper Arm Bike) L1.2 for 3 mins forward and 3 mins backward  Shoulder Exercises: Supine   Horizontal ABduction Strengthening;Both;Theraband   2x15   Theraband Level (Shoulder Horizontal ABduction) Level 3 (Green)    Flexion AAROM;Both   SPC 2x15   Diagonals Strengthening;Both;20 reps;Theraband   2x10   Theraband Level (Shoulder Diagonals) Level 3 (Green)    Other Supine Exercises scap press 2x15      Shoulder Exercises: Seated   Retraction Strengthening;Both   2x15   Theraband Level (Shoulder Retraction) Level 3 (Green)    Corporate treasurer;Both   2x15   Theraband Level (Shoulder Row) Level 3 (Green)    Other Seated Exercises punches green band 2x15      Shoulder Exercises: Stretch   Other Shoulder Stretches with SPC for assist with AAROM: x10 flexion and scap plane flexion with 3s holds                    PT Education - 12/27/20 1613     Education Details Pt educated on all exercises with proper technique    Person(s) Educated Patient    Methods  Explanation;Demonstration;Tactile cues;Verbal cues    Comprehension Verbalized understanding;Returned demonstration              PT Short Term Goals - 12/26/20 1034       PT SHORT TERM GOAL #1   Title The patient will demonstrate knowledge of basic self care strategies and ex's to promote ROM and improved function    Status Achieved      PT SHORT TERM GOAL #2   Title The patient will report a 30% improvement in right shoulder pain with sleep, dressing and reaching for a higher shelf    Status On-going               PT Long Term Goals - 12/13/20 2001       PT LONG TERM GOAL #1   Title The patient will be independent with safe self progression of HEP    Time 8    Period Weeks    Status New    Target Date 02/07/21      PT LONG TERM GOAL #2   Title The patient will have improved flexion/scaption to 150 degrees needed for reaching overhead with greater ease    Time 8    Period Weeks    Status New      PT LONG TERM GOAL #3   Title The patient will have grossly 4-/5 to 4/5  shoulder/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    Time 8    Period Weeks    Status New      PT LONG TERM GOAL #4   Title The patient will be able to lift a 3# object down from a higher shelf    Time 8    Period Weeks    Status New      PT LONG TERM GOAL #5   Title The patient will report a 60% improvement in pain at night and with usual home and work ADLs    Time 8    Period Weeks    Status New      Additional Long Term Goals   Additional Long Term Goals Yes      PT LONG TERM GOAL #6   Title FOTO score improved from 45% to 65%    Time 8    Period Weeks    Status New  Plan - 12/27/20 1613     Clinical Impression Statement PT continues to have night time shoulder pain and intermittently during the day pending what she does at work (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 Pt has been struggling to complete HEP due to being caregiver for husband at home. Pt benefited from cues for technique throughout session. Pt reports slight pain with end range shoulder flexion. Pt will continue to benefit from skilled PT to address Rt shoulder strength, flexibility and use of Rt UE and improve sleep.    Personal Factors and Comorbidities Comorbidity 1;Comorbidity 2    Comorbidities osteoporosis; HTN    Examination-Activity Limitations Reach Overhead;Caring for Others;Dressing;Lift;Sleep    Examination-Participation Restrictions Meal Prep;Cleaning;Occupation    Stability/Clinical Decision Making Stable/Uncomplicated    Clinical Decision Making Low    Rehab Potential Good    PT Frequency 2x / week    PT Duration 8 weeks    PT Treatment/Interventions ADLs/Self Care Home Management;Aquatic Therapy;Cryotherapy;Electrical Stimulation;Ultrasound;Moist Heat;Iontophoresis '4mg'$ /ml Dexamethasone;Therapeutic activities;Therapeutic exercise;Manual techniques;Patient/family education;Dry needling;Taping;Vasopneumatic Device    PT Next Visit Plan right shoulder assisted ROM; low level strengthening start with rows and shoulder extension initially;  thoracic extension; scapular mobility, ionto (order is signed.  Pt declined today because she wants to swim today)    PT Home Exercise Plan [id number]    Consulted and Agree with Plan of Care Patient             Patient will benefit from skilled therapeutic intervention in order to improve the following deficits and impairments:  Decreased range of motion, Impaired UE functional use, Pain, Decreased strength  Visit Diagnosis: Muscle weakness (generalized)  Abnormal posture  Other abnormalities of gait and mobility     Problem List Patient Active Problem List   Diagnosis Date Noted   Osteopenia/?Osteoporosis - followed by her gynecologist, Dr. Pamala Hurry 06/20/2012   Dyslipidemia 06/20/2012   H/O  Turner syndrome 05/02/2012   Essential hypertension 02/03/2009   INFLAMMATORY BOWEL DISEASE - Followed by GI 02/03/2009   COLONIC POLYPS, HYPERPLASTIC, HX OF 02/03/2009    Stacy Gardner, PT 08/09/224:22 PM  [facility] [address] [place], [place], [address] Phone: [phone]   Fax:  [phone]  Name: ANJOLAOLUWA STJULIAN MRN: [id number] Date of Birth: 25-May-1955

## 2021-01-02 ENCOUNTER — Ambulatory Visit: Payer: 59 | Admitting: Physical Therapy

## 2021-01-02 ENCOUNTER — Other Ambulatory Visit: Payer: Self-pay

## 2021-01-02 DIAGNOSIS — M25511 Pain in right shoulder: Secondary | ICD-10-CM | POA: Diagnosis not present

## 2021-01-02 DIAGNOSIS — R2689 Other abnormalities of gait and mobility: Secondary | ICD-10-CM | POA: Diagnosis not present

## 2021-01-02 DIAGNOSIS — R293 Abnormal posture: Secondary | ICD-10-CM | POA: Diagnosis not present

## 2021-01-02 DIAGNOSIS — R252 Cramp and spasm: Secondary | ICD-10-CM

## 2021-01-02 DIAGNOSIS — M6281 Muscle weakness (generalized): Secondary | ICD-10-CM

## 2021-01-02 DIAGNOSIS — G8929 Other chronic pain: Secondary | ICD-10-CM | POA: Diagnosis not present

## 2021-01-02 NOTE — Therapy (Signed)
[facility] [address] [place], [place], [address] Phone: [phone]   Fax:  [phone]  Physical Therapy Treatment  Patient Details  Name: CHAMARA MALAK MRN: [id number] Date of Birth: 10/22/1955 Referring Provider (PT): Dr. Betty Martinique   Encounter Date: 01/02/2021   PT End of Session - 01/02/21 1601     Visit Number 5    Date for PT Re-Evaluation 02/07/21    Authorization Type [employer] employee    PT Start Time 1533    PT Stop Time [id number]    PT Time Calculation (min) 42 min    Activity Tolerance Patient tolerated treatment well    Behavior During Therapy [facility] for tasks assessed/performed             Past Medical History:  Diagnosis Date   B12 DEFICIENCY 02/09/2009   Qualifier: Diagnosis of  By: Ronnald Ramp RN, CGRN, Sheri     Blood in stool    COLONIC POLYPS, HYPERPLASTIC, HX OF 02/03/2009   Qualifier: Diagnosis of  By: Surface RN, Brent General, COLON 02/03/2009   Qualifier: Diagnosis of  By: Varney Daily RN, Donna     Dyslipidemia 06/20/2012   Fracture, intertrochanteric, left femur ([place]) 05/02/2012   GERD (gastroesophageal reflux disease)    Hearing loss    Bil/has hearing aids   Hemorrhoids    HEMORRHOIDS 02/03/2009   Qualifier: Diagnosis of  By: Surface RN, Butch Penny     Hypertension    INFLAMMATORY BOWEL DISEASE - Followed by Dr. 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 and d/c this  at age 89yr  Ulcerative colitis     Past Surgical History:  Procedure Laterality Date   COLONOSCOPY  last 03/25/2013   HERNIA REPAIR  2009   lt ing    [place] 2004    lt thumb dog bite   INTRAMEDULLARY (IM) NAIL INTERTROCHANTERIC  05/02/2012   Procedure: INTRAMEDULLARY (IM) NAIL INTERTROCHANTRIC;  Surgeon: JJohnny Bridge MD;  [place]  Service: Orthopedics;  Laterality: Left;   ORIF  FINGER FRACTURE  06/14/2011   Procedure: OPEN REDUCTION INTERNAL FIXATION (ORIF) METACARPAL (FINGER) FRACTURE;  Surgeon: KTennis Must MD;  [place]  Service: Orthopedics;  Laterality: Right;  right ring   POLYPECTOMY     TONSILLECTOMY     UPPER GASTROINTESTINAL ENDOSCOPY      There were no vitals filed for this visit.   Subjective Assessment - 01/02/21 1537     Subjective Pt reports she has been busy with work and home needs and reports she has had 2 days where pain as been a little worse.    Pertinent History Right hand dominant;  retiring Jul 13, 2021;  hip fracture 10 years ago    Limitations House hold activities    Diagnostic tests none    Patient Stated Goals pain free; better movement; no surgery    Currently in Pain? Yes    Pain Score 2     Pain Location Shoulder    Pain Orientation Right;Upper    Pain Descriptors / Indicators Aching    Pain Type Acute pain  [facility] Adult PT Treatment/Exercise - 01/02/21 0001       Exercises   Exercises Neck;Shoulder      Neck Exercises: Machines for Strengthening   UBE (Upper Arm Bike) L1.2 for 3 mins forward and 3 mins backward      Neck Exercises: Seated   Shoulder Rolls Backwards;Forwards;10 reps      Shoulder Exercises: Supine   Horizontal ABduction Strengthening;Both;Theraband   2x15   Theraband Level (Shoulder Horizontal ABduction) Level 1 (Yellow)    Flexion AAROM;Both   SPC 2x15   Other Supine Exercises scap press 2x15      Shoulder Exercises: Seated   Retraction --   2x15   Row Strengthening;Both   2x15   Theraband Level (Shoulder Row) Level 3 (Green)    Flexion Strengthening;Both;20 reps    Flexion Weight (lbs) 1    Abduction Strengthening;Both;20 reps    ABduction Weight (lbs) 1    Diagonals Strengthening;Both;20 reps   2x10   Theraband Level (Shoulder Diagonals) Level 3 (Green)    Other Seated Exercises punches green band 2x15                     PT Education - 01/02/21 1600     Education Details Pt educated on exercises with good technique and adjustments made to resistance based on pt's pain control for optimal strength gains throughout session    Person(s) Educated Patient    Methods Explanation;Demonstration;Tactile cues;Verbal cues    Comprehension Verbalized understanding;Returned demonstration              PT Short Term Goals - 12/26/20 1034       PT SHORT TERM GOAL #1   Title The patient will demonstrate knowledge of basic self care strategies and ex's to promote ROM and improved function    Status Achieved      PT SHORT TERM GOAL #2   Title The patient will report a 30% improvement in right shoulder pain with sleep, dressing and reaching for a higher shelf    Status On-going               PT Long Term Goals - 12/13/20 2001       PT LONG TERM GOAL #1   Title The patient will be independent with safe self progression of HEP    Time 8    Period Weeks    Status New    Target Date 02/07/21      PT LONG TERM GOAL #2   Title The patient will have improved flexion/scaption to 150 degrees needed for reaching overhead with greater ease    Time 8    Period Weeks    Status New      PT LONG TERM GOAL #3   Title The patient will have grossly 4-/5 to 4/5  shoulder/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    Time 8    Period Weeks    Status New      PT LONG TERM GOAL #4   Title The patient will be able to lift a 3# object down from a higher shelf    Time 8    Period Weeks    Status New      PT LONG TERM GOAL #5   Title The patient will report a 60% improvement in pain at night and with usual home and work ADLs    Time 8    Period Weeks  Status New      Additional Long Term Goals   Additional Long Term Goals Yes      PT LONG TERM GOAL #6   Title FOTO score improved from 45% to 65%    Time 8    Period Weeks    Status New                    Plan - 01/02/21 1602     Clinical Impression Statement Pt continues to report she has intermittent pain at night but the last wek days she has had some pain in Rt shoulder. Pt sesion focused on strengthening and stretching at Rt shoulder with pt reporting decreased pain at end of session and felt  better. Pt would benefit from continued PT for improved mobility, decreased pain, improved strength and flexibility, and improve QOL    Personal Factors and Comorbidities Comorbidity 1;Comorbidity 2    Comorbidities osteoporosis; HTN    Examination-Activity Limitations Reach Overhead;Caring for Others;Dressing;Lift;Sleep    Examination-Participation Restrictions Meal Prep;Cleaning;Occupation    Stability/Clinical Decision Making Stable/Uncomplicated    Clinical Decision Making Low    Rehab Potential Good    PT Frequency 2x / week    PT Duration 8 weeks    PT Treatment/Interventions ADLs/Self Care Home Management;Aquatic Therapy;Cryotherapy;Electrical Stimulation;Ultrasound;Moist Heat;Iontophoresis '4mg'$ /ml Dexamethasone;Therapeutic activities;Therapeutic exercise;Manual techniques;Patient/family education;Dry needling;Taping;Vasopneumatic Device    PT Next Visit Plan right shoulder assisted ROM; low level strengthening start with rows and shoulder extension initially;  thoracic extension; scapular mobility, ionto (order is signed.  Pt declined today because she wants to swim today)    PT Home Exercise Plan [id number]    Consulted and Agree with Plan of Care Patient             Patient will benefit from skilled therapeutic intervention in order to improve the following deficits and impairments:  Decreased range of motion, Impaired UE functional use, Pain, Decreased strength  Visit Diagnosis: Muscle weakness (generalized)  Cramp and spasm     Problem List Patient Active Problem List   Diagnosis Date Noted   Osteopenia/?Osteoporosis - followed by her gynecologist,  Dr. Pamala Hurry 06/20/2012   Dyslipidemia 06/20/2012   H/O Turner syndrome 05/02/2012   Essential hypertension 02/03/2009   INFLAMMATORY BOWEL DISEASE - Followed by GI 02/03/2009   COLONIC POLYPS, HYPERPLASTIC, HX OF 02/03/2009   Stacy Gardner, PT 08/15/225:07 PM   [facility] [address] [place], [place], [address] Phone: [phone]   Fax:  [phone]  Name: JASENIA PEWITT MRN: [id number] Date of Birth: 09/29/1955

## 2021-01-05 ENCOUNTER — Ambulatory Visit: Payer: 59 | Admitting: Physical Therapy

## 2021-01-05 ENCOUNTER — Other Ambulatory Visit: Payer: Self-pay

## 2021-01-05 DIAGNOSIS — R252 Cramp and spasm: Secondary | ICD-10-CM

## 2021-01-05 DIAGNOSIS — R2689 Other abnormalities of gait and mobility: Secondary | ICD-10-CM | POA: Diagnosis not present

## 2021-01-05 DIAGNOSIS — R293 Abnormal posture: Secondary | ICD-10-CM | POA: Diagnosis not present

## 2021-01-05 DIAGNOSIS — M25511 Pain in right shoulder: Secondary | ICD-10-CM

## 2021-01-05 DIAGNOSIS — G8929 Other chronic pain: Secondary | ICD-10-CM | POA: Diagnosis not present

## 2021-01-05 DIAGNOSIS — M6281 Muscle weakness (generalized): Secondary | ICD-10-CM

## 2021-01-05 NOTE — Therapy (Signed)
[facility] [address], [place], [place], [address] Phone: [phone]   Fax:  [phone]  Physical Therapy Treatment  Patient Details  Name: [name] MRN: [id number] Date of Birth: May 19, 1956 Referring Provider (PT): Dr. Betty Martinique   Encounter Date: 01/05/2021   PT End of Session - 01/05/21 1557     Visit Number 6    Date for PT Re-Evaluation 02/07/21    Authorization Type [employer] employee    PT Start Time 1532    PT Stop Time [id number]    PT Time Calculation (min) 42 min    Activity Tolerance Patient tolerated treatment well    Behavior During Therapy [facility] for tasks assessed/performed             Past Medical History:  Diagnosis Date   B12 DEFICIENCY 02/09/2009   Qualifier: Diagnosis of  By: Ronnald Ramp RN, CGRN, Sheri     Blood in stool    COLONIC POLYPS, HYPERPLASTIC, HX OF 02/03/2009   Qualifier: Diagnosis of  By: Surface RN, Brent General, COLON 02/03/2009   Qualifier: Diagnosis of  By: Varney Daily RN, Donna     Dyslipidemia 06/20/2012   Fracture, intertrochanteric, left femur ([place]) 05/02/2012   GERD (gastroesophageal reflux disease)    Hearing loss    Bil/has hearing aids   Hemorrhoids    HEMORRHOIDS 02/03/2009   Qualifier: Diagnosis of  By: Surface RN, Butch Penny     Hypertension    INFLAMMATORY BOWEL DISEASE - Followed by Dr. 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 and d/c this  at age 55yr  Ulcerative colitis     Past Surgical History:  Procedure Laterality Date   COLONOSCOPY  last 03/25/2013   HERNIA REPAIR  2009   lt ing    [place] 2004    lt thumb dog bite   INTRAMEDULLARY (IM) NAIL INTERTROCHANTERIC  05/02/2012   Procedure: INTRAMEDULLARY (IM) NAIL INTERTROCHANTRIC;  Surgeon: JJohnny Bridge MD;  [facility]  Service: Orthopedics;  Laterality: Left;   ORIF  FINGER FRACTURE  06/14/2011   Procedure: OPEN REDUCTION INTERNAL FIXATION (ORIF) METACARPAL (FINGER) FRACTURE;  Surgeon: KTennis Must MD;  [facility] [facility]  Service: Orthopedics;  Laterality: Right;  right ring   POLYPECTOMY     TONSILLECTOMY     UPPER GASTROINTESTINAL ENDOSCOPY      There were no vitals filed for this visit.   Subjective Assessment - 01/05/21 1539     Subjective Pt reports she feel much better today and 2/10 at start of session and just dull achy, took orders at work today and wasn't as painful during the day. Pt reports she thinks the night time painful lately and isn't waking as much and less painful in morning.    Pertinent History Right hand dominant;  retiring Jul 13, 2021;  hip fracture 10 years ago    Limitations House hold activities    Diagnostic tests none    Patient Stated Goals pain free; better movement; no surgery    Currently in Pain? Yes    Pain Score 2     Pain Location Shoulder    Pain Orientation Right;Upper    Pain Descriptors / Indicators Aching;Dull    Pain Type Acute pain  [facility] Adult PT Treatment/Exercise - 01/05/21 0001       Exercises   Exercises Neck;Shoulder      Neck Exercises: Machines for Strengthening   UBE (Upper Arm Bike) L1.2 for 3 mins forward and 3 mins backward      Shoulder Exercises: Supine   Horizontal ABduction Strengthening;Both;Theraband   2x15   Theraband Level (Shoulder Horizontal ABduction) Level 3 (Green)    Horizontal ABduction Limitations SPC at 90 deg shoulder flexion with Rt and Lt horizontal Abduction    Flexion AAROM;Both   SPC 2x15   Diagonals --   2x10   Theraband Level (Shoulder Diagonals) --    Other Supine Exercises shoulder extension green band x10      Shoulder Exercises: Seated   Row Strengthening;Both   2x15   Theraband Level (Shoulder Row) Level 3 (Green)    Flexion --    Flexion Weight (lbs) --    Abduction --     ABduction Weight (lbs) --    Diagonals Strengthening;Both;20 reps   2x10   Theraband Level (Shoulder Diagonals) Level 3 (Green)    Other Seated Exercises punches green band 2x15      Shoulder Exercises: Pulleys   Flexion 3 minutes      Shoulder Exercises: ROM/Strengthening   Other ROM/Strengthening Exercises finger ladder Rt x 10, abduction x10                    PT Education - 01/05/21 1556     Education Details Pt educated on exercises with proper technique throughout session    Person(s) Educated Patient    Methods Explanation;Demonstration;Tactile cues;Verbal cues    Comprehension Verbalized understanding;Returned demonstration              PT Short Term Goals - 12/26/20 1034       PT SHORT TERM GOAL #1   Title The patient will demonstrate knowledge of basic self care strategies and ex's to promote ROM and improved function    Status Achieved      PT SHORT TERM GOAL #2   Title The patient will report a 30% improvement in right shoulder pain with sleep, dressing and reaching for a higher shelf    Status On-going               PT Long Term Goals - 12/13/20 2001       PT LONG TERM GOAL #1   Title The patient will be independent with safe self progression of HEP    Time 8    Period Weeks    Status New    Target Date 02/07/21      PT LONG TERM GOAL #2   Title The patient will have improved flexion/scaption to 150 degrees needed for reaching overhead with greater ease    Time 8    Period Weeks    Status New      PT LONG TERM GOAL #3   Title The patient will have grossly 4-/5 to 4/5  shoulder/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    Time 8    Period Weeks    Status New      PT LONG TERM GOAL #4   Title The patient will be able to lift a 3# object down from a higher shelf    Time 8    Period Weeks    Status New      PT LONG TERM GOAL #5  Title The patient will report a 60% improvement in  pain at night and with usual home and work ADLs    Time 8    Period Weeks    Status New      Additional Long Term Goals   Additional Long Term Goals Yes      PT LONG TERM GOAL #6   Title FOTO score improved from 45% to 65%    Time 8    Period Weeks    Status New                   Plan - 01/05/21 1557     Clinical Impression Statement Pt presenting to clinic reporting improvement in pain thorugh the night, better day today with less pain and hasn't woken up through the night with pain for the past few nights. Pt session focused on shoulder and scap strengthening and flexibility with pt tolerating well with limited pain. Pt would benefit from continued PT for improved mobility, decreased pain, improved strength and flexibility, and improve QOL    Personal Factors and Comorbidities Comorbidity 1;Comorbidity 2    Comorbidities osteoporosis; HTN    Examination-Activity Limitations Reach Overhead;Caring for Others;Dressing;Lift;Sleep    Examination-Participation Restrictions Meal Prep;Cleaning;Occupation    Stability/Clinical Decision Making Stable/Uncomplicated    Clinical Decision Making Low    Rehab Potential Good    PT Frequency 2x / week    PT Duration 8 weeks    PT Treatment/Interventions ADLs/Self Care Home Management;Aquatic Therapy;Cryotherapy;Electrical Stimulation;Ultrasound;Moist Heat;Iontophoresis '4mg'$ /ml Dexamethasone;Therapeutic activities;Therapeutic exercise;Manual techniques;Patient/family education;Dry needling;Taping;Vasopneumatic Device    PT Next Visit Plan right shoulder assisted ROM;  thoracic extension; scapular mobility    PT Home Exercise Plan [email or web address]    Consulted and Agree with Plan of Care Patient             Patient will benefit from skilled therapeutic intervention in order to improve the following deficits and impairments:  Decreased range of motion, Impaired UE functional use, Pain, Decreased strength  Visit Diagnosis: Acute pain of right  shoulder  Muscle weakness (generalized)  Cramp and spasm     Problem List Patient Active Problem List   Diagnosis Date Noted   Osteopenia/?Osteoporosis - followed by her gynecologist, Dr. Pamala Hurry 06/20/2012   Dyslipidemia 06/20/2012   H/O Turner syndrome 05/02/2012   Essential hypertension 02/03/2009   INFLAMMATORY BOWEL DISEASE - Followed by GI 02/03/2009   COLONIC POLYPS, HYPERPLASTIC, HX OF 02/03/2009    Stacy Gardner, PT 08/18/224:15 PM   [facility] [address], [place], [place], [address] Phone: [phone]   Fax:  [phone]  Name: [name] MRN: [id number] Date of Birth: [birth year]

## 2021-01-06 ENCOUNTER — Other Ambulatory Visit (HOSPITAL_COMMUNITY): Payer: Self-pay

## 2021-01-12 ENCOUNTER — Other Ambulatory Visit: Payer: Self-pay

## 2021-01-12 ENCOUNTER — Ambulatory Visit: Payer: 59 | Admitting: Physical Therapy

## 2021-01-12 ENCOUNTER — Other Ambulatory Visit (HOSPITAL_COMMUNITY): Payer: Self-pay

## 2021-01-12 ENCOUNTER — Other Ambulatory Visit: Payer: Self-pay | Admitting: Family Medicine

## 2021-01-12 DIAGNOSIS — M6281 Muscle weakness (generalized): Secondary | ICD-10-CM | POA: Diagnosis not present

## 2021-01-12 DIAGNOSIS — M25511 Pain in right shoulder: Secondary | ICD-10-CM | POA: Diagnosis not present

## 2021-01-12 DIAGNOSIS — R252 Cramp and spasm: Secondary | ICD-10-CM | POA: Diagnosis not present

## 2021-01-12 DIAGNOSIS — R2689 Other abnormalities of gait and mobility: Secondary | ICD-10-CM | POA: Diagnosis not present

## 2021-01-12 DIAGNOSIS — R293 Abnormal posture: Secondary | ICD-10-CM | POA: Diagnosis not present

## 2021-01-12 DIAGNOSIS — G8929 Other chronic pain: Secondary | ICD-10-CM | POA: Diagnosis not present

## 2021-01-12 MED ORDER — LISINOPRIL 30 MG PO TABS
30.0000 mg | ORAL_TABLET | Freq: Every day | ORAL | 1 refills | Status: DC
  Filled 2021-01-12: qty 90, 90d supply, fill #0
  Filled 2021-05-09: qty 90, 90d supply, fill #1

## 2021-01-12 NOTE — Patient Instructions (Signed)
Access Code: [email or web address] URL: [email or web address] Date: 01/12/2021 Prepared by: Ruben Im  Exercises Seated Scapular Retraction - 1 x daily - 7 x weekly - 1 sets - 10 reps Seated Thoracic Lumbar Extension - 1 x daily - 7 x weekly - 1 sets - 10 reps Sitting Wall Angels - 1 x daily - 7 x weekly - 1 sets - 10 reps - 10s holds Seated Shoulder Flexion Slide at Table Top with Forearm in Neutral - 1 x daily - 7 x weekly - 1 sets - 10 reps Standing Low Shoulder Row with Anchored Resistance - 1 x daily - 7 x weekly - 1-2 sets - 10 reps Single Arm Shoulder Extension with Anchored Resistance - 1 x daily - 7 x weekly - 1-2 sets - 10 reps Seated Shoulder Horizontal Abduction with Resistance - 1 x daily - 7 x weekly - 1-2 sets - 10 reps Seated Shoulder Diagonal Pulls with Resistance - 1 x daily - 7 x weekly - 1 sets - 10 reps

## 2021-01-12 NOTE — Therapy (Signed)
[facility] [address], [place], [place], [address] Phone: [phone]   Fax:  [phone]  Physical Therapy Treatment  Patient Details  Name: [name] MRN: [id number] Date of Birth: [birth year] Referring Provider (PT): Dr. Betty Martinique   Encounter Date: 01/12/2021   PT End of Session - 01/12/21 1652     Visit Number 7    Date for PT Re-Evaluation 02/07/21    Authorization Type [employer] employee    PT Start Time [id number]    PT Stop Time [id number]    PT Time Calculation (min) 39 min    Activity Tolerance Patient tolerated treatment well             Past Medical History:  Diagnosis Date   B12 DEFICIENCY 02/09/2009   Qualifier: Diagnosis of  By: Ronnald Ramp RN, CGRN, Sheri     Blood in stool    COLONIC POLYPS, HYPERPLASTIC, HX OF 02/03/2009   Qualifier: Diagnosis of  By: Surface RN, Brent General, COLON 02/03/2009   Qualifier: Diagnosis of  By: Varney Daily RN, Donna     Dyslipidemia 06/20/2012   Fracture, intertrochanteric, left femur ([place]) 05/02/2012   GERD (gastroesophageal reflux disease)    Hearing loss    Bil/has hearing aids   Hemorrhoids    HEMORRHOIDS 02/03/2009   Qualifier: Diagnosis of  By: Surface RN, Butch Penny     Hypertension    INFLAMMATORY BOWEL DISEASE - Followed by Dr. 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 and d/c this  at age 63yr  Ulcerative colitis     Past Surgical History:  Procedure Laterality Date   COLONOSCOPY  last 03/25/2013   HERNIA REPAIR  2009   lt ing    [place] 2004    lt thumb dog bite   INTRAMEDULLARY (IM) NAIL INTERTROCHANTERIC  05/02/2012   Procedure: INTRAMEDULLARY (IM) NAIL INTERTROCHANTRIC;  Surgeon: JJohnny Bridge MD;  [facility]  Service: Orthopedics;  Laterality: Left;   ORIF FINGER FRACTURE  06/14/2011   Procedure: OPEN REDUCTION INTERNAL  FIXATION (ORIF) METACARPAL (FINGER) FRACTURE;  Surgeon: KTennis Must MD;  [facility]  Service: Orthopedics;  Laterality: Right;  right ring   POLYPECTOMY     TONSILLECTOMY     UPPER GASTROINTESTINAL ENDOSCOPY      There were no vitals filed for this visit.   Subjective Assessment - 01/12/21 1619     Subjective Difficulty with raising arm.  Sleeping OK.    Pertinent History Right hand dominant;  retiring Jul 13, 2021;  hip fracture 10 years ago    Currently in Pain? No/denies    Pain Score 0-No pain                [facility] PT Assessment - 01/12/21 0001       AROM   Right Shoulder Flexion 136 Degrees    Right Shoulder ABduction 167 Degrees    Right Shoulder Internal Rotation --   S1   Right Shoulder External Rotation 77 Degrees                           [facility] Adult PT Treatment/Exercise - 01/12/21 0001       Neck Exercises: Machines for Strengthening   UBE (  Upper Arm Bike) L1.0 for 3 mins forward and 3 mins backward      Shoulder Exercises: Seated   Diagonals Strengthening;Both;20 reps   2x10   Theraband Level (Shoulder Diagonals) Level 3 (Green)    Other Seated Exercises low punches green 10x      Shoulder Exercises: Standing   Extension Strengthening;Both;20 reps;Theraband    Theraband Level (Shoulder Extension) Level 3 (Green)    Row [employer];Theraband    Theraband Level (Shoulder Row) Level 3 (Green)      Shoulder Exercises: Pulleys   Other Pulley Exercises standing pulleys 3 way counter weights 3# 3x 10      Shoulder Exercises: ROM/Strengthening   Other ROM/Strengthening Exercises table top flexion slides 10x right to replace cane flexion since painful                    PT Education - 01/12/21 1651     Education Details table slides, green band rows, shoulder extensions green band, green band diagonals    Person(s) Educated Patient    Methods Explanation;Demonstration;Handout     Comprehension Returned demonstration;Verbalized understanding              PT Short Term Goals - 01/12/21 1705       PT SHORT TERM GOAL #1   Title The patient will demonstrate knowledge of basic self care strategies and ex's to promote ROM and improved function    Status Achieved      PT SHORT TERM GOAL #2   Title The patient will report a 30% improvement in right shoulder pain with sleep, dressing and reaching for a higher shelf    Status Achieved      PT SHORT TERM GOAL #3   Title The patient will have improved right shoulder flexion/scaption to 130 needed for reaching overhead    Status Achieved      PT SHORT TERM GOAL #4   Title The patient will have improved internal rotation to L1 needed for greater ease with dressing tasks    Time 4    Period Weeks    Status On-going               PT Long Term Goals - 12/13/20 2001       PT LONG TERM GOAL #1   Title The patient will be independent with safe self progression of HEP    Time 8    Period Weeks    Status New    Target Date 02/07/21      PT LONG TERM GOAL #2   Title The patient will have improved flexion/scaption to 150 degrees needed for reaching overhead with greater ease    Time 8    Period Weeks    Status New      PT LONG TERM GOAL #3   Title The patient will have grossly 4-/5 to 4/5  shoulder/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    Time 8    Period Weeks    Status New      PT LONG TERM GOAL #4   Title The patient will be able to lift a 3# object down from a higher shelf    Time 8    Period Weeks    Status New      PT LONG TERM GOAL #5   Title The patient will report a 60% improvement in pain at night and with usual home and work  ADLs    Time 8    Period Weeks    Status New      Additional Long Term Goals   Additional Long Term Goals Yes      PT LONG TERM GOAL #6   Title FOTO score improved from 45% to 65%    Time 8    Period Weeks     Status New                   Plan - 01/12/21 1659     Clinical Impression Statement Significant improvements in shoulder ROM noted particularly abduction.  Pain with elevation perists but lower in intensity.  Verbal and tactile cues for scapular retraction and depression with lat activation.  Progressing with STGs.  Therapist monitoring response and modifying treatment accordingly.    Comorbidities osteoporosis; HTN    Examination-Activity Limitations Reach Overhead;Caring for Others;Dressing;Lift;Sleep    Rehab Potential Good    PT Frequency 2x / week    PT Duration 8 weeks    PT Treatment/Interventions ADLs/Self Care Home Management;Aquatic Therapy;Cryotherapy;Electrical Stimulation;Ultrasound;Moist Heat;Iontophoresis '4mg'$ /ml Dexamethasone;Therapeutic activities;Therapeutic exercise;Manual techniques;Patient/family education;Dry needling;Taping;Vasopneumatic Device    PT Next Visit Plan right shoulder ROM;  thoracic extension; scapular mobility; glenohumeral and scapular strengthening    PT Home Exercise Plan [id number]             Patient will benefit from skilled therapeutic intervention in order to improve the following deficits and impairments:  Decreased range of motion, Impaired UE functional use, Pain, Decreased strength  Visit Diagnosis: Acute pain of right shoulder  Muscle weakness (generalized)     Problem List Patient Active Problem List   Diagnosis Date Noted   Osteopenia/?Osteoporosis - followed by her gynecologist, Dr. Pamala Hurry 06/20/2012   Dyslipidemia 06/20/2012   H/O Turner syndrome 05/02/2012   Essential hypertension 02/03/2009   INFLAMMATORY BOWEL DISEASE - Followed by GI 02/03/2009   COLONIC POLYPS, HYPERPLASTIC, HX OF 02/03/2009   Ruben Im, PT 01/12/21 5:06 PM Phone: [phone] Fax: [phone]  Alvera Singh 01/12/2021, 5:06 PM  [facility] [address] [place], [place], [address] Phone: [phone]   Fax:  [phone]  Name: [name] MRN: [id number] Date of Birth: 22-Jul-1955

## 2021-01-16 ENCOUNTER — Other Ambulatory Visit: Payer: Self-pay

## 2021-01-16 ENCOUNTER — Ambulatory Visit: Payer: 59

## 2021-01-16 DIAGNOSIS — M6281 Muscle weakness (generalized): Secondary | ICD-10-CM | POA: Diagnosis not present

## 2021-01-16 DIAGNOSIS — R252 Cramp and spasm: Secondary | ICD-10-CM

## 2021-01-16 DIAGNOSIS — R2689 Other abnormalities of gait and mobility: Secondary | ICD-10-CM | POA: Diagnosis not present

## 2021-01-16 DIAGNOSIS — R293 Abnormal posture: Secondary | ICD-10-CM | POA: Diagnosis not present

## 2021-01-16 DIAGNOSIS — G8929 Other chronic pain: Secondary | ICD-10-CM | POA: Diagnosis not present

## 2021-01-16 DIAGNOSIS — M25511 Pain in right shoulder: Secondary | ICD-10-CM | POA: Diagnosis not present

## 2021-01-16 NOTE — Therapy (Signed)
[facility] [address] [place], [place], [address] Phone: [phone]   Fax:  [phone]  Physical Therapy Treatment  Patient Details  Name: [name] MRN: [id number] Date of Birth: Apr 11, 1956 Referring Provider (PT): Dr. Betty Martinique   Encounter Date: 01/16/2021   PT End of Session - 01/16/21 1610     Visit Number 8    Date for PT Re-Evaluation 02/07/21    Authorization Type [employer] employee    PT Start Time 1534    PT Stop Time 1612    PT Time Calculation (min) 38 min    Activity Tolerance Patient tolerated treatment well    Behavior During Therapy [facility] for tasks assessed/performed             Past Medical History:  Diagnosis Date   B12 DEFICIENCY 02/09/2009   Qualifier: Diagnosis of  By: Ronnald Ramp RN, CGRN, Sheri     Blood in stool    COLONIC POLYPS, HYPERPLASTIC, HX OF 02/03/2009   Qualifier: Diagnosis of  By: Surface RN, Brent General, COLON 02/03/2009   Qualifier: Diagnosis of  By: Varney Daily RN, Donna     Dyslipidemia 06/20/2012   Fracture, intertrochanteric, left femur ([place]) 05/02/2012   GERD (gastroesophageal reflux disease)    Hearing loss    Bil/has hearing aids   Hemorrhoids    HEMORRHOIDS 02/03/2009   Qualifier: Diagnosis of  By: Surface RN, Butch Penny     Hypertension    INFLAMMATORY BOWEL DISEASE - Followed by Dr. 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 and d/c this  at age 48yr  Ulcerative colitis     Past Surgical History:  Procedure Laterality Date   COLONOSCOPY  last 03/25/2013   [place]ERNIA REPAIR  2009   lt ing    [place]    [place]   INTRAMEDULLARY (IM) NAIL INTERTROCHANTERIC  05/02/2012   Procedure: INTRAMEDULLARY (IM) NAIL INTERTROCHANTRIC;  Surgeon: JJohnny Bridge MD;  [place]  Service: Orthopedics;  Laterality: Left;   ORIF  FINGER FRACTURE  06/14/2011   Procedure: OPEN REDUCTION INTERNAL FIXATION (ORIF) METACARPAL (FINGER) FRACTURE;  Surgeon: KTennis Must MD;  [place] [facility]  Service: Orthopedics;  Laterality: Right;  right ring   POLYPECTOMY     TONSILLECTOMY     UPPER GASTROINTESTINAL ENDOSCOPY      There were no vitals filed for this visit.   Subjective Assessment - 01/16/21 1537     Subjective I had a little bit of pain at work with lifting.    Currently in Pain? No/denies   Only hurts when I lift it                              [facility] - [facility] 0001       Neck Exercises: Machines for Strengthening   UBE (Upper Arm Bike) L1.5 for 3 mins forward and 3 mins backward      Shoulder Exercises: Seated   Flexion Strengthening;Both;20 reps    Flexion Weight (lbs) 1    Diagonals Strengthening;Both;20 reps   2x10     Shoulder Exercises: Standing   Flexion AAROM;Strengthening;Right    Shoulder Flexion Weight (lbs) 1  Other Standing Exercises wall push-ups 2x10      Shoulder Exercises: Pulleys   Other Pulley Exercises standing pulleys 3 way counter weights 3# 3x 10      Shoulder Exercises: ROM/Strengthening   Other ROM/Strengthening Exercises table top flexion slides 10x right to replace cane flexion since painful      Shoulder Exercises: Stretch   Other Shoulder Stretches ball rolls seated x 10                      PT Short Term Goals - 01/16/21 1539       PT SHORT TERM GOAL #1   Title The patient will demonstrate knowledge of basic self care strategies and ex's to promote ROM and improved function    Status Achieved      PT SHORT TERM GOAL #2   Title The patient will report a 30% improvement in right shoulder pain with sleep, dressing and reaching for a higher shelf    Status Achieved      PT SHORT TERM GOAL #3   Title The patient will have improved right shoulder flexion/scaption to 130 needed for  reaching overhead    Baseline 136    Status Achieved      PT SHORT TERM GOAL #4   Title The patient will have improved internal rotation to L1 needed for greater ease with dressing tasks    Baseline S1    Status On-going               PT Long Term Goals - 12/13/20 2001       PT LONG TERM GOAL #1   Title The patient will be independent with safe self progression of HEP    Time 8    Period Weeks    Status New    Target Date 02/07/21      PT LONG TERM GOAL #2   Title The patient will have improved flexion/scaption to 150 degrees needed for reaching overhead with greater ease    Time 8    Period Weeks    Status New      PT LONG TERM GOAL #3   Title The patient will have grossly 4-/5 to 4/5  shoulder/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    Time 8    Period Weeks    Status New      PT LONG TERM GOAL #4   Title The patient will be able to lift a 3# object down from a higher shelf    Time 8    Period Weeks    Status New      PT LONG TERM GOAL #5   Title The patient will report a 60% improvement in pain at night and with usual home and work ADLs    Time 8    Period Weeks    Status New      Additional Long Term Goals   Additional Long Term Goals Yes      PT LONG TERM GOAL #6   Title FOTO score improved from 45% to 65%    Time 8    Period Weeks    Status New                   Plan - 01/16/21 1553     Clinical Impression Statement Pt demonstrated significant improvements in shoulder ROM last week.   Pt denies any pain unless reaching overhead  or lifting heavy objects. Pt was able to reach overhead with light weight today with minimal to no increase in Rt shoulder pain. Verbal and tactile cues for scapular retraction and depression with lat activation with exercise today.  Pt will continue to benefit from skilled PT to address Rt shoulder strength, functional use and flexibility to improve use.    PT  Treatment/Interventions ADLs/Self Care Home Management;Aquatic Therapy;Cryotherapy;Electrical Stimulation;Ultrasound;Moist Heat;Iontophoresis '4mg'$ /ml Dexamethasone;Therapeutic activities;Therapeutic exercise;Manual techniques;Patient/family education;Dry needling;Taping;Vasopneumatic Device    PT Next Visit Plan right shoulder ROM;  thoracic extension; scapular mobility; glenohumeral and scapular strengthening    PT Home Exercise Plan [id number]    Consulted and Agree with Plan of Care Patient             Patient will benefit from skilled therapeutic intervention in order to improve the following deficits and impairments:     Visit Diagnosis: Acute pain of right shoulder  Muscle weakness (generalized)  Cramp and spasm  Abnormal posture     Problem List Patient Active Problem List   Diagnosis Date Noted   Osteopenia/?Osteoporosis - followed by her gynecologist, Dr. Pamala Hurry 06/20/2012   Dyslipidemia 06/20/2012   H/O Turner syndrome 05/02/2012   Essential hypertension 02/03/2009   INFLAMMATORY BOWEL DISEASE - Followed by GI 02/03/2009   COLONIC POLYPS, HYPERPLASTIC, HX OF 02/03/2009    Sigurd Sos, PT 01/16/21 4:11 PM   [facility] [address], [place], [place], [address] Phone: [phone]   Fax:  [phone]  Name: MALARY MABIN MRN: [id number] Date of Birth: 06/05/1955

## 2021-01-19 ENCOUNTER — Ambulatory Visit: Payer: 59 | Attending: Family Medicine | Admitting: Physical Therapy

## 2021-01-19 ENCOUNTER — Other Ambulatory Visit: Payer: Self-pay

## 2021-01-19 ENCOUNTER — Encounter: Payer: Self-pay | Admitting: Physical Therapy

## 2021-01-19 DIAGNOSIS — R293 Abnormal posture: Secondary | ICD-10-CM | POA: Insufficient documentation

## 2021-01-19 DIAGNOSIS — R2689 Other abnormalities of gait and mobility: Secondary | ICD-10-CM | POA: Insufficient documentation

## 2021-01-19 DIAGNOSIS — M6281 Muscle weakness (generalized): Secondary | ICD-10-CM | POA: Diagnosis not present

## 2021-01-19 DIAGNOSIS — R252 Cramp and spasm: Secondary | ICD-10-CM | POA: Diagnosis not present

## 2021-01-19 DIAGNOSIS — M25511 Pain in right shoulder: Secondary | ICD-10-CM | POA: Diagnosis not present

## 2021-01-19 NOTE — Therapy (Signed)
[facility] [address] [place], [place], [address] Phone: [phone]   Fax:  [phone]  Physical Therapy Treatment  Patient Details  Name: [name] MRN: [id number] Date of Birth: [birth year] Referring Provider (PT): Dr. Betty Martinique   Encounter Date: 01/19/2021   PT End of Session - 01/19/21 1550     Visit Number 9    Date for PT Re-Evaluation 02/07/21    Authorization Type [employer] employee    PT Start Time 1533    PT Stop Time 1612    PT Time Calculation (min) 39 min    Activity Tolerance Patient tolerated treatment well;No increased pain    Behavior During Therapy [facility] for tasks assessed/performed             Past Medical History:  Diagnosis Date   B12 DEFICIENCY 02/09/2009   Qualifier: Diagnosis of  By: Ronnald Ramp RN, CGRN, Sheri     Blood in stool    COLONIC POLYPS, HYPERPLASTIC, HX OF 02/03/2009   Qualifier: Diagnosis of  By: Surface RN, Brent General, COLON 02/03/2009   Qualifier: Diagnosis of  By: Surface RN, Donna     Dyslipidemia 06/20/2012   Fracture, intertrochanteric, left femur ([place]) 05/02/2012   GERD (gastroesophageal reflux disease)    Hearing loss    Bil/has hearing aids   Hemorrhoids    HEMORRHOIDS 02/03/2009   Qualifier: Diagnosis of  By: Surface RN, Butch Penny     Hypertension    INFLAMMATORY BOWEL DISEASE - Followed by Dr. 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 and d/c this  at age 39yr  Ulcerative colitis     Past Surgical History:  Procedure Laterality Date   COLONOSCOPY  last 03/25/2013   HERNIA REPAIR  2009   lt ing    [place] 2004    lt thumb dog bite   INTRAMEDULLARY (IM) NAIL INTERTROCHANTERIC  05/02/2012   Procedure: INTRAMEDULLARY (IM) NAIL INTERTROCHANTRIC;  Surgeon: JJohnny Bridge MD;  [place] [facility]  Service: Orthopedics;  Laterality:  Left;   ORIF FINGER FRACTURE  06/14/2011   Procedure: OPEN REDUCTION INTERNAL FIXATION (ORIF) METACARPAL (FINGER) FRACTURE;  Surgeon: KTennis Must MD;  [place]  Service: Orthopedics;  Laterality: Right;  right ring   POLYPECTOMY     TONSILLECTOMY     UPPER GASTROINTESTINAL ENDOSCOPY      There were no vitals filed for this visit.   Subjective Assessment - 01/19/21 1536     Subjective Pt states that she is noticing some improvement in reaching her arm up overhead. She still has pain at night, but this is a little better.    Currently in Pain? No/denies                               [facility] - 01/19[facility]21 0001       Shoulder Exercises: Seated   Diagonals Strengthening;10 reps    Theraband Level (Shoulder Diagonals) Level 1 (Yellow)    Diagonals Limitations cues to use posterior shoulder during diagonal movement    Other Seated Exercises Rt shoulder rolls x15 reps      Shoulder Exercises: Sidelying   Other Sidelying Exercises Rt shoulder protraction with red physioball  x20 reps      Shoulder Exercises: Standing   Other Standing Exercises Rt bicep curl 2x10 reps yellow TB    Other Standing Exercises BUE press/protraction with red TB x10 reps      Manual Therapy   Manual Therapy Joint mobilization    Joint Mobilization Rt scap faciliation all directions- pt in sidelying                    PT Education - 01/19/21 2211     Education Details diagonals with yellow TB if able to complete without shoulder irritation    Person(s) Educated Patient    Methods Explanation;Verbal cues;Handout    Comprehension Returned demonstration;Verbalized understanding              PT Short Term Goals - 01/19/21 1538       PT SHORT TERM GOAL #1   Title The patient will demonstrate knowledge of basic self care strategies and ex's to promote ROM and improved function    Status Achieved      PT SHORT TERM  GOAL #2   Title The patient will report a 30% improvement in right shoulder pain with sleep, dressing and reaching for a higher shelf    Status Achieved      PT SHORT TERM GOAL #3   Title The patient will have improved right shoulder flexion/scaption to 130 needed for reaching overhead    Baseline 136    Status Achieved      PT SHORT TERM GOAL #4   Title The patient will have improved internal rotation to L1 needed for greater ease with dressing tasks    Baseline L5    Status On-going               PT Long Term Goals - 12/13/20 2001       PT LONG TERM GOAL #1   Title The patient will be independent with safe self progression of HEP    Time 8    Period Weeks    Status New    Target Date 02/07/21      PT LONG TERM GOAL #2   Title The patient will have improved flexion/scaption to 150 degrees needed for reaching overhead with greater ease    Time 8    Period Weeks    Status New      PT LONG TERM GOAL #3   Title The patient will have grossly 4-/5 to 4/5  shoulder/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    Time 8    Period Weeks    Status New      PT LONG TERM GOAL #4   Title The patient will be able to lift a 3# object down from a higher shelf    Time 8    Period Weeks    Status New      PT LONG TERM GOAL #5   Title The patient will report a 60% improvement in pain at night and with usual home and work ADLs    Time 8    Period Weeks    Status New      Additional Long Term Goals   Additional Long Term Goals Yes      PT LONG TERM GOAL #6   Title FOTO score improved from 45% to 65%    Time 8    Period Weeks    Status New  Plan - 01/19/21 2212     Clinical Impression Statement Pt continues to have pain with reaching overhead, however this is reportedly improved from the start of PT. She is also having more nights that her shoulder does not bother her. Pt is able to reach behind the  back with pain to L1. Pt demonstrated overhead press with diagonal, targeting the anterior shoulder. PT provided tactile/verbal cues to increase posterior shoulder activation. Pt denied pain during the exercise, but reported that her Rt shoulder was a little sore in the following exercise of theraband presses. Pt would continue to benefit from therex progression and manual techniques to improve shoulder control and glenohumeral mobility.    PT Treatment/Interventions ADLs/Self Care Home Management;Aquatic Therapy;Cryotherapy;Electrical Stimulation;Ultrasound;Moist Heat;Iontophoresis '4mg'$ /ml Dexamethasone;Therapeutic activities;Therapeutic exercise;Manual techniques;Patient/family education;Dry needling;Taping;Vasopneumatic Device    PT Next Visit Plan right shoulder ROM;  [place] mobs; thoracic extension; scapular mobility; glenohumeral and scapular strengthening    PT Home Exercise Plan [id number]    Consulted and Agree with Plan of Care Patient             Patient will benefit from skilled therapeutic intervention in order to improve the following deficits and impairments:     Visit Diagnosis: Acute pain of right shoulder  Muscle weakness (generalized)  Cramp and spasm  Abnormal posture     Problem List Patient Active Problem List   Diagnosis Date Noted   Osteopenia/?Osteoporosis - followed by her gynecologist, Dr. Pamala Hurry 06/20/2012   Dyslipidemia 06/20/2012   H/O Turner syndrome 05/02/2012   Essential hypertension 02/03/2009   INFLAMMATORY BOWEL DISEASE - Followed by GI 02/03/2009   COLONIC POLYPS, HYPERPLASTIC, HX OF 02/03/2009   10:21 PM,01/19/21 Sherol Dade PT, DPT [facility] [address] [place], [place], [address] Phone: [phone]   Fax:  [phone]  Name: [name] MRN: [id number] Date of Birth: 01/01/1956

## 2021-01-24 ENCOUNTER — Ambulatory Visit: Payer: 59 | Admitting: Physical Therapy

## 2021-01-24 ENCOUNTER — Encounter: Payer: Self-pay | Admitting: Physical Therapy

## 2021-01-24 ENCOUNTER — Other Ambulatory Visit: Payer: Self-pay

## 2021-01-24 DIAGNOSIS — M25511 Pain in right shoulder: Secondary | ICD-10-CM | POA: Diagnosis not present

## 2021-01-24 DIAGNOSIS — R293 Abnormal posture: Secondary | ICD-10-CM

## 2021-01-24 DIAGNOSIS — M6281 Muscle weakness (generalized): Secondary | ICD-10-CM

## 2021-01-24 DIAGNOSIS — R252 Cramp and spasm: Secondary | ICD-10-CM

## 2021-01-24 DIAGNOSIS — R2689 Other abnormalities of gait and mobility: Secondary | ICD-10-CM

## 2021-01-24 NOTE — Therapy (Signed)
[facility] [address], [place], [place], [address] Phone: [phone]   Fax:  [phone]  Physical Therapy Treatment  Patient Details  Name: [name] MRN: [id number] Date of Birth: May 20, 1956 Referring Provider (PT): Dr. Betty Martinique   Encounter Date: 01/24/2021   PT End of Session - 01/24/21 1531     Visit Number 10    Date for PT Re-Evaluation 02/07/21    Authorization Type [employer] employee    PT Start Time 1532    PT Stop Time [id number]    PT Time Calculation (min) 38 min    Activity Tolerance Patient tolerated treatment well;No increased pain    Behavior During Therapy [facility] for tasks assessed/performed             Past Medical History:  Diagnosis Date   B12 DEFICIENCY 02/09/2009   Qualifier: Diagnosis of  By: Ronnald Ramp RN, CGRN, Sheri     Blood in stool    COLONIC POLYPS, HYPERPLASTIC, HX OF 02/03/2009   Qualifier: Diagnosis of  By: Surface RN, Brent General, COLON 02/03/2009   Qualifier: Diagnosis of  By: Surface RN, Donna     Dyslipidemia 06/20/2012   Fracture, intertrochanteric, left femur ([place]) 05/02/2012   GERD (gastroesophageal reflux disease)    Hearing loss    Bil/has hearing aids   Hemorrhoids    HEMORRHOIDS 02/03/2009   Qualifier: Diagnosis of  By: Surface RN, Butch Penny     Hypertension    INFLAMMATORY BOWEL DISEASE - Followed by Dr. 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 and d/c this  at age 89yr  Ulcerative colitis     Past Surgical History:  Procedure Laterality Date   COLONOSCOPY  last 03/25/2013   HERNIA REPAIR  2009   lt ing    [place] 2004    lt thumb dog bite   INTRAMEDULLARY (IM) NAIL INTERTROCHANTERIC  05/02/2012   Procedure: INTRAMEDULLARY (IM) NAIL INTERTROCHANTRIC;  Surgeon: JJohnny Bridge MD;  [place]  Service: Orthopedics;  Laterality:  Left;   ORIF FINGER FRACTURE  06/14/2011   Procedure: OPEN REDUCTION INTERNAL FIXATION (ORIF) METACARPAL (FINGER) FRACTURE;  Surgeon: KTennis Must MD;  [place]  Service: Orthopedics;  Laterality: Right;  right ring   POLYPECTOMY     TONSILLECTOMY     UPPER GASTROINTESTINAL ENDOSCOPY      There were no vitals filed for this visit.   Subjective Assessment - 01/24/21 1533     Subjective Pain has decreased so that she can sleep through the night. Pain is only with going behind her back and overhead.    Pertinent History Right hand dominant;  retiring Jul 13, 2021;  hip fracture 10 years ago    Patient Stated Goals pain free; better movement; no surgery    Currently in Pain? No/denies                               [facility] - 01/24/[facility] 0001       Neck Exercises: Standing   Upper Extremity D2 Flexion;10 reps    Theraband Level (UE D2) Level 1 (Yellow)    UE D2 Limitations from floor and also 10 from waist  Shoulder Exercises: Supine   Protraction Right;20 reps;Weights    Protraction Weight (lbs) 3# and 5# x 10 ea    Flexion Both;20 reps;Theraband    Theraband Level (Shoulder Flexion) --   light green loop around wrists   Other Supine Exercises triceps 3# 2x10 90/90      Shoulder Exercises: Seated   Other Seated Exercises partial OH press using stabilizer pole x 10      Shoulder Exercises: Sidelying   Other Sidelying Exercises 2# Trio: ER, flexion, horizontal ABD x 10 ea x 3 rounds      Shoulder Exercises: Standing   Flexion Right;10 reps    Flexion Limitations with towel after looped flexion below    Other Standing Exercises wall slides bil with light green loop elbows stay against wall 2x10                  Upper Extremity Functional Index Score :   /80     PT Short Term Goals - 01/19/21 1538       PT SHORT TERM GOAL #1   Title The patient will demonstrate knowledge of basic self  care strategies and ex's to promote ROM and improved function    Status Achieved      PT SHORT TERM GOAL #2   Title The patient will report a 30% improvement in right shoulder pain with sleep, dressing and reaching for a higher shelf    Status Achieved      PT SHORT TERM GOAL #3   Title The patient will have improved right shoulder flexion/scaption to 130 needed for reaching overhead    Baseline 136    Status Achieved      PT SHORT TERM GOAL #4   Title The patient will have improved internal rotation to L1 needed for greater ease with dressing tasks    Baseline L5    Status On-going               PT Long Term Goals - 12/13/20 2001       PT LONG TERM GOAL #1   Title The patient will be independent with safe self progression of HEP    Time 8    Period Weeks    Status New    Target Date 02/07/21      PT LONG TERM GOAL #2   Title The patient will have improved flexion/scaption to 150 degrees needed for reaching overhead with greater ease    Time 8    Period Weeks    Status New      PT LONG TERM GOAL #3   Title The patient will have grossly 4-/5 to 4/5  shoulder/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    Time 8    Period Weeks    Status New      PT LONG TERM GOAL #4   Title The patient will be able to lift a 3# object down from a higher shelf    Time 8    Period Weeks    Status New      PT LONG TERM GOAL #5   Title The patient will report a 60% improvement in pain at night and with usual home and work ADLs    Time 8    Period Weeks    Status New      Additional Long Term Goals   Additional Long Term Goals Yes      PT  LONG TERM GOAL #6   Title FOTO score improved from 45% to 65%    Time 8    Period Weeks    Status New                   Plan - 01/24/21 1818     Clinical Impression Statement Patient still reporting pain with OH ADLS and reaching behind her back. She has signficant weakness in  posterior scapular muscles and did well with new exercises today. Plan to progress tempo of these as tolerated and add to HEP next visit.    Comorbidities osteoporosis; HTN    PT Treatment/Interventions ADLs/Self Care Home Management;Aquatic Therapy;Cryotherapy;Electrical Stimulation;Ultrasound;Moist Heat;Iontophoresis '4mg'$ /ml Dexamethasone;Therapeutic activities;Therapeutic exercise;Manual techniques;Patient/family education;Dry needling;Taping;Vasopneumatic Device    PT Next Visit Plan assess goals, continue with SDLY trio and try tempo; right shoulder ROM;  [place] mobs; thoracic extension; scapular mobility; glenohumeral and scapular strengthening    PT Home Exercise Plan [id number]             Patient will benefit from skilled therapeutic intervention in order to improve the following deficits and impairments:  Decreased range of motion, Impaired UE functional use, Pain, Decreased strength  Visit Diagnosis: Acute pain of right shoulder  Muscle weakness (generalized)  Cramp and spasm  Abnormal posture  Other abnormalities of gait and mobility     Problem List Patient Active Problem List   Diagnosis Date Noted   Osteopenia/?Osteoporosis - followed by her gynecologist, Dr. Pamala Hurry 06/20/2012   Dyslipidemia 06/20/2012   H/O Turner syndrome 05/02/2012   Essential hypertension 02/03/2009   INFLAMMATORY BOWEL DISEASE - Followed by GI 02/03/2009   COLONIC POLYPS, HYPERPLASTIC, HX OF 02/03/2009   Madelyn Flavors PT 01/24/2021, 6:21 PM  [facility] [address] [place], [place], [address] Phone: [phone]   Fax:  [phone]  Name: [name] MRN: [id number] Date of Birth: 02/24/1956

## 2021-01-26 ENCOUNTER — Other Ambulatory Visit: Payer: Self-pay

## 2021-01-26 ENCOUNTER — Encounter: Payer: Self-pay | Admitting: Physical Therapy

## 2021-01-26 ENCOUNTER — Ambulatory Visit: Payer: 59 | Admitting: Physical Therapy

## 2021-01-26 DIAGNOSIS — M25511 Pain in right shoulder: Secondary | ICD-10-CM | POA: Diagnosis not present

## 2021-01-26 DIAGNOSIS — R252 Cramp and spasm: Secondary | ICD-10-CM | POA: Diagnosis not present

## 2021-01-26 DIAGNOSIS — R2689 Other abnormalities of gait and mobility: Secondary | ICD-10-CM | POA: Diagnosis not present

## 2021-01-26 DIAGNOSIS — R293 Abnormal posture: Secondary | ICD-10-CM | POA: Diagnosis not present

## 2021-01-26 DIAGNOSIS — M6281 Muscle weakness (generalized): Secondary | ICD-10-CM

## 2021-01-26 NOTE — Patient Instructions (Signed)
Access Code: [email or web address] URL: [email or web address] Date: 01/26/2021 Prepared by: Almyra Free  Exercises Seated Scapular Retraction - 1 x daily - 7 x weekly - 1 sets - 10 reps Seated Thoracic Lumbar Extension - 1 x daily - 7 x weekly - 1 sets - 10 reps Sitting Wall Angels - 1 x daily - 7 x weekly - 1 sets - 10 reps - 10s holds Seated Shoulder Flexion Slide at Table Top with Forearm in Neutral - 1 x daily - 7 x weekly - 1 sets - 10 reps Standing Low Shoulder Row with Anchored Resistance - 1 x daily - 7 x weekly - 1-2 sets - 10 reps Single Arm Shoulder Extension with Anchored Resistance - 1 x daily - 7 x weekly - 1-2 sets - 10 reps Seated Shoulder Horizontal Abduction with Resistance - 1 x daily - 7 x weekly - 1-2 sets - 10 reps Seated Shoulder Diagonal Pulls with Resistance - 1 x daily - 7 x weekly - 1 sets - 10 reps Sidelying Shoulder ER with Towel and Dumbbell - 1 x daily - 3-4 x weekly - 3 sets - 10 reps Sidelying Shoulder Flexion 15 Degrees (Mirrored) - 1 x daily - 3-4 x weekly - 3 sets - 10 reps Sidelying Shoulder Horizontal Abduction - 1 x daily - 3-4 x weekly - 3 sets - 10 reps

## 2021-01-26 NOTE — Therapy (Signed)
[facility] [address] [place], [place], [address] Phone: [phone]   Fax:  [phone]  Physical Therapy Treatment  Patient Details  Name: [name] MRN: [id number] Date of Birth: 1955/12/27 Referring Provider (PT): Dr. Betty Martinique   Encounter Date: 01/26/2021   PT End of Session - 01/26/21 1530     Visit Number 11    Date for PT Re-Evaluation 02/07/21    Authorization Type [employer] employee    PT Start Time 1531    PT Stop Time 2542    PT Time Calculation (min) 44 min    Activity Tolerance Patient tolerated treatment well;No increased pain    Behavior During Therapy [facility] for tasks assessed/performed             Past Medical History:  Diagnosis Date   B12 DEFICIENCY 02/09/2009   Qualifier: Diagnosis of  By: Ronnald Ramp RN, CGRN, Sheri     Blood in stool    COLONIC POLYPS, HYPERPLASTIC, HX OF 02/03/2009   Qualifier: Diagnosis of  By: Surface RN, Brent General, COLON 02/03/2009   Qualifier: Diagnosis of  By: Surface RN, Donna     Dyslipidemia 06/20/2012   Fracture, intertrochanteric, left femur ([place]) 05/02/2012   GERD (gastroesophageal reflux disease)    Hearing loss    Bil/has hearing aids   Hemorrhoids    HEMORRHOIDS 02/03/2009   Qualifier: Diagnosis of  By: Surface RN, Butch Penny     Hypertension    INFLAMMATORY BOWEL DISEASE - Followed by Dr. 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 and d/c this  at age 63yr  Ulcerative colitis     Past Surgical History:  Procedure Laterality Date   COLONOSCOPY  last 03/25/2013   HERNIA REPAIR  2009   lt ing    [place] 2004    lt thumb dog bite   INTRAMEDULLARY (IM) NAIL INTERTROCHANTERIC  05/02/2012   Procedure: INTRAMEDULLARY (IM) NAIL INTERTROCHANTRIC;  Surgeon: JJohnny Bridge MD;  [place]  Service: Orthopedics;  Laterality:  Left;   ORIF FINGER FRACTURE  06/14/2011   Procedure: OPEN REDUCTION INTERNAL FIXATION (ORIF) METACARPAL (FINGER) FRACTURE;  Surgeon: KTennis Must MD;  [place]  Service: Orthopedics;  Laterality: Right;  right ring   POLYPECTOMY     TONSILLECTOMY     UPPER GASTROINTESTINAL ENDOSCOPY      There were no vitals filed for this visit.   Subjective Assessment - 01/26/21 1531     Subjective No new complaints    Pertinent History Right hand dominant;  retiring Jul 13, 2021;  hip fracture 10 years ago    Patient Stated Goals pain free; better movement; no surgery    Currently in Pain? No/denies                [facility] - 01/26/21 0001       AROM   Right Shoulder Flexion 160 Degrees   143 standing   Right Shoulder ABduction 120 Degrees   scaption 156     Strength   Right Shoulder Flexion 4+/5   2/10   Right Shoulder Extension 5/5    Right Shoulder ABduction 5/5    Right Shoulder Internal Rotation 4+/5   3/10   Right Shoulder External Rotation 4+/5  1/10                          [facility] Adult PT Treatment/Exercise - 01/26/21 0001       Shoulder Exercises: Supine   Flexion Both;20 reps;Theraband      Shoulder Exercises: Sidelying   Other Sidelying Exercises 2# Trio with tempo (up,2,3; hold 2,3; down 2,3): ER, flexion, horizontal ABD x 10 ea x 3 rounds      Shoulder Exercises: Standing   Other Standing Exercises wall slides bil with light green loop elbows stay against wall 1x10    Other Standing Exercises biceps curl to [facility] press right 3# x 10; shelf reaches 2# x 10, 3# x 10                       PT Short Term Goals - 01/26/21 1719       PT SHORT TERM GOAL #4   Title The patient will have improved internal rotation to L1 needed for greater ease with dressing tasks    Baseline L5    Status On-going               PT Long Term Goals - 01/26/21 1608       PT LONG TERM GOAL #1   Title The  patient will be independent with safe self progression of HEP    Baseline still needs VCs/TCs    Status On-going      PT LONG TERM GOAL #2   Title The patient will have improved flexion/scaption to 150 degrees needed for reaching overhead with greater ease    Baseline scaption met; flexion was met during bicep curl to [facility] press exercise, but not with straight AROM    Status Partially Met      PT LONG TERM GOAL #3   Title The patient will have grossly 4-/5 to 4/5  shoulder/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 Her ROM and strength have improved significantly and she can now sleep without pain most nights. She still has pain at work when having to hold trays in an underhanded position and she will benefit from strengthening in this position.    PT Frequency 2x / week    PT Duration 8 weeks    PT Treatment/Interventions ADLs/Self Care Home Management;Aquatic Therapy;Cryotherapy;Electrical Stimulation;Ultrasound;Moist Heat;Iontophoresis 26m/ml Dexamethasone;Therapeutic activities;Therapeutic exercise;Manual techniques;Patient/family education;Dry needling;Taping;Vasopneumatic Device    PT Next Visit Plan continue with SDLY tempo trio; strengthening OH and in work simulated positions; test mid low trap strength;  right shoulder ROM;  GClarkdalemobs; thoracic extension; scapular mobility; glenohumeral and scapular strengthening    PT Home Exercise Plan [email or web address]    Consulted  and Agree with Plan of Care Patient             Patient will benefit from skilled therapeutic intervention in order to improve the following deficits and impairments:  Decreased range of motion, Impaired UE functional use, Pain, Decreased strength  Visit Diagnosis: Muscle weakness (generalized)  Acute pain of right shoulder  Cramp and spasm  Abnormal posture     Problem List Patient Active Problem List   Diagnosis Date Noted   Osteopenia/?Osteoporosis - followed by her gynecologist, Dr. Pamala Hurry 06/20/2012   Dyslipidemia 06/20/2012   H/O Turner syndrome 05/02/2012   Essential hypertension 02/03/2009   INFLAMMATORY BOWEL DISEASE - Followed by GI 02/03/2009   COLONIC POLYPS, HYPERPLASTIC, HX OF 02/03/2009    Madelyn Flavors PT  01/26/2021, 5:24 PM  [facility] [address] [place], [place], [address] Phone: [phone]   Fax:  [phone]  Name: [name] MRN: [id number] Date of Birth: 02-10-1956

## 2021-02-02 ENCOUNTER — Other Ambulatory Visit: Payer: Self-pay

## 2021-02-02 ENCOUNTER — Encounter: Payer: Self-pay | Admitting: Physical Therapy

## 2021-02-02 ENCOUNTER — Ambulatory Visit: Payer: 59 | Admitting: Physical Therapy

## 2021-02-02 DIAGNOSIS — R293 Abnormal posture: Secondary | ICD-10-CM | POA: Diagnosis not present

## 2021-02-02 DIAGNOSIS — R252 Cramp and spasm: Secondary | ICD-10-CM | POA: Diagnosis not present

## 2021-02-02 DIAGNOSIS — M25511 Pain in right shoulder: Secondary | ICD-10-CM

## 2021-02-02 DIAGNOSIS — R2689 Other abnormalities of gait and mobility: Secondary | ICD-10-CM | POA: Diagnosis not present

## 2021-02-02 DIAGNOSIS — M6281 Muscle weakness (generalized): Secondary | ICD-10-CM

## 2021-02-02 NOTE — Patient Instructions (Signed)
Access Code: [email or web address] URL: [email or web address] Date: 02/02/2021 Prepared by: Aragon  Exercises Seated Thoracic Lumbar Extension - 1 x daily - 7 x weekly - 1 sets - 10 reps Single Arm Shoulder Extension with Anchored Resistance - 1 x daily - 7 x weekly - 1-2 sets - 10 reps Seated Shoulder Diagonal Pulls with Resistance - 1 x daily - 7 x weekly - 1 sets - 10 reps Sidelying Shoulder ER with Towel and Dumbbell - 1 x daily - 3-4 x weekly - 3 sets - 10 reps Sidelying Shoulder Horizontal Abduction - 1 x daily - 3-4 x weekly - 3 sets - 10 reps Standing Single Shoulder Flexion Wall Slide with Palm Up - 1 x daily - 7 x weekly - 1 sets - 10 reps Shoulder External Rotation with Anchored Resistance - 1 x daily - 7 x weekly - 3 sets - 10 reps Seated Shoulder Y's - 1 x daily - 7 x weekly - 3 sets - 5 reps   [facility] [address], [place], [place] [address] Phone # [phone] Fax [phone]

## 2021-02-02 NOTE — Therapy (Signed)
[facility] [address], [place], [place], [address] Phone: [phone]   Fax:  [phone]  Physical Therapy Treatment  Patient Details  Name: [name] MRN: [id number] Date of Birth: December 08, 1955 Referring Provider (PT): Dr. Betty Martinique   Encounter Date: 02/02/2021   PT End of Session - 02/02/21 1711     Visit Number 12    Date for PT Re-Evaluation 02/07/21    Authorization Type [facility] employee    PT Start Time 1536    PT Stop Time 3474    PT Time Calculation (min) 39 min    Activity Tolerance Patient tolerated treatment well;No increased pain    Behavior During Therapy [facility] for tasks assessed/performed             Past Medical History:  Diagnosis Date   B12 DEFICIENCY 02/09/2009   Qualifier: Diagnosis of  By: Ronnald Ramp RN, CGRN, Sheri     Blood in stool    COLONIC POLYPS, HYPERPLASTIC, HX OF 02/03/2009   Qualifier: Diagnosis of  By: Surface RN, Brent General, COLON 02/03/2009   Qualifier: Diagnosis of  By: Surface RN, Donna     Dyslipidemia 06/20/2012   Fracture, intertrochanteric, left femur ([place]) 05/02/2012   GERD (gastroesophageal reflux disease)    Hearing loss    Bil/has hearing aids   Hemorrhoids    HEMORRHOIDS 02/03/2009   Qualifier: Diagnosis of  By: Surface RN, Butch Penny     Hypertension    INFLAMMATORY BOWEL DISEASE - Followed by Dr. 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 and d/c this  at age 22yr  Ulcerative colitis     Past Surgical History:  Procedure Laterality Date   COLONOSCOPY  last 03/25/2013   HERNIA REPAIR  2009   lt ing    [place] 2004    lt thumb dog bite   INTRAMEDULLARY (IM) NAIL INTERTROCHANTERIC  05/02/2012   Procedure: INTRAMEDULLARY (IM) NAIL INTERTROCHANTRIC;  Surgeon: JJohnny Bridge MD;  [place] [facility]  Service: Orthopedics;  Laterality:  Left;   ORIF FINGER FRACTURE  06/14/2011   Procedure: OPEN REDUCTION INTERNAL FIXATION (ORIF) METACARPAL (FINGER) FRACTURE;  Surgeon: KTennis Must MD;  [place]  Service: Orthopedics;  Laterality: Right;  right ring   POLYPECTOMY     TONSILLECTOMY     UPPER GASTROINTESTINAL ENDOSCOPY      There were no vitals filed for this visit.   Subjective Assessment - 02/02/21 1537     Subjective Pt states that her shoulder botered her this morning for some reason. It feels good now.    Pertinent History Right hand dominant;  retiring Jul 13, 2021;  hip fracture 10 years ago    Patient Stated Goals pain free; better movement; no surgery    Currently in Pain? No/denies                               [facility] - 02[facility]02[facility]21 0001       Self-Care   Self-Care Posture    Posture work modifications while lifting trays to decrease pain and strain on the Rt UE      Exercises   Exercises Other Exercises    Other Exercises  standing bent over shoulder flexion hold with end range lift off from table 5x3 sec hold, 2 rounds- PT tactile cuing to activate lower traps      Shoulder Exercises: Standing   External Rotation Strengthening;Right;10 reps    External Rotation Limitations ER to neutral and punch forward, 2nd set with #2 wrist weight    Flexion AAROM;Right;5 reps    Flexion Limitations against wall    Other Standing Exercises UE ranger L25 x10 reps, pt encouraged to reach just before pain    Other Standing Exercises bicep curls: 5# dumbbells x10 reps, 2x10 reps with light green band around wrists                     PT Education - 02/02/21 1618     Education Details technique with therex; work modifications to avoid excess strain on the Rt shoulder    Person(s) Educated Patient    Methods Explanation;Handout;Verbal cues    Comprehension Verbalized understanding;Returned demonstration              PT Short  Term Goals - 01/26/21 1719       PT SHORT TERM GOAL #4   Title The patient will have improved internal rotation to L1 needed for greater ease with dressing tasks    Baseline L5    Status On-going               PT Long Term Goals - 01/26/21 1608       PT LONG TERM GOAL #1   Title The patient will be independent with safe self progression of HEP    Baseline still needs VCs/TCs    Status On-going      PT LONG TERM GOAL #2   Title The patient will have improved flexion/scaption to 150 degrees needed for reaching overhead with greater ease    Baseline scaption met; flexion was met during bicep curl to [facility] press exercise, but not with straight AROM    Status Partially Met      PT LONG TERM GOAL #3   Title The patient will have grossly 4-/5 to 4/5  shoulder/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 PT spent a portion of today's session reviewing how the pt handles trays at work. She is typically using primarily the Rt UE to hold and lift the trays onto the cart. PT reviewed modifications to this which would allow more Lt UE use, and the pt had good understanding of this. Remainder of the session focused on improving elbow/shoulder flexion through pain free ranges. Pt was able to complete shoulder flexion lift off from a table, but she required PT  cuing to activate her lower traps. Will full  reassess pt's progress towards goals and the need for discharge or more PT at her next visit.    PT Frequency 2x / week    PT Duration 8 weeks    PT Treatment/Interventions ADLs/Self Care Home Management;Aquatic Therapy;Cryotherapy;Electrical Stimulation;Ultrasound;Moist Heat;Iontophoresis 55m/ml Dexamethasone;Therapeutic activities;Therapeutic exercise;Manual techniques;Patient/family education;Dry needling;Taping;Vasopneumatic Device;Visual/perceptual remediation/compensation    PT Next Visit Plan re-eval; f/u on modifications to lifting trays at work; progress overhead reaching strength and reaching forward without pain    PT Home Exercise Plan [id number]   Consulted and Agree with Plan of Care Patient             Patient will benefit from skilled therapeutic intervention in order to improve the following deficits and impairments:  Decreased range of motion, Impaired UE functional use, Pain, Decreased strength  Visit Diagnosis: Muscle weakness (generalized)  Acute pain of right shoulder  Cramp and spasm     Problem List Patient Active Problem List   Diagnosis Date Noted   Osteopenia/?Osteoporosis - followed by her gynecologist, Dr. FPamala Hurry01/31/2014   Dyslipidemia 06/20/2012   H/O Turner syndrome 05/02/2012   Essential hypertension 02/03/2009   INFLAMMATORY BOWEL DISEASE - Followed by GI 02/03/2009   COLONIC POLYPS, HYPERPLASTIC, HX OF 02/03/2009   5:16 PM,02/02/21 SSherol DadePT, DPT [facility] [place] [place] [address]Phone: [phone]  Fax:  [phone] Name: DVERTIS BAUDERMRN: [id number] of Birth: [birth year]

## 2021-02-06 ENCOUNTER — Ambulatory Visit: Payer: 59

## 2021-02-06 ENCOUNTER — Other Ambulatory Visit: Payer: Self-pay

## 2021-02-06 DIAGNOSIS — M6281 Muscle weakness (generalized): Secondary | ICD-10-CM

## 2021-02-06 DIAGNOSIS — M25511 Pain in right shoulder: Secondary | ICD-10-CM

## 2021-02-06 DIAGNOSIS — R252 Cramp and spasm: Secondary | ICD-10-CM

## 2021-02-06 DIAGNOSIS — R2689 Other abnormalities of gait and mobility: Secondary | ICD-10-CM | POA: Diagnosis not present

## 2021-02-06 DIAGNOSIS — R293 Abnormal posture: Secondary | ICD-10-CM | POA: Diagnosis not present

## 2021-02-06 NOTE — Therapy (Signed)
[facility] [address] [place], [place], [address] Phone: [phone]   Fax:  [phone]  Physical Therapy Treatment  Patient Details  Name: [name] MRN: [id number] Date of Birth: Jun 25, 1955 Referring Provider (PT): Dr. Betty Martinique   Encounter Date: 02/06/2021   PT End of Session - 02/06/21 1522     Visit Number 13    Date for PT Re-Evaluation 02/07/21    Authorization Type [employer] employee    PT Start Time 7026    PT Stop Time 3785    PT Time Calculation (min) 30 min    Activity Tolerance Patient tolerated treatment well;No increased pain    Behavior During Therapy [facility] for tasks assessed/performed             Past Medical History:  Diagnosis Date   B12 DEFICIENCY 02/09/2009   Qualifier: Diagnosis of  By: Ronnald Ramp RN, CGRN, Sheri     Blood in stool    COLONIC POLYPS, HYPERPLASTIC, HX OF 02/03/2009   Qualifier: Diagnosis of  By: Surface RN, Brent General, COLON 02/03/2009   Qualifier: Diagnosis of  By: Surface RN, Donna     Dyslipidemia 06/20/2012   Fracture, intertrochanteric, left femur ([place]) 05/02/2012   GERD (gastroesophageal reflux disease)    Hearing loss    Bil/has hearing aids   Hemorrhoids    HEMORRHOIDS 02/03/2009   Qualifier: Diagnosis of  By: Surface RN, Butch Penny     Hypertension    INFLAMMATORY BOWEL DISEASE - Followed by Dr. 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 and d/c this  at age 37yr   Ulcerative colitis     Past Surgical History:  Procedure Laterality Date   COLONOSCOPY  last 03/25/2013   HERNIA REPAIR  2009   lt ing    [place]  2004    lt thumb dog bite   INTRAMEDULLARY (IM) NAIL INTERTROCHANTERIC  05/02/2012   Procedure: INTRAMEDULLARY (IM) NAIL INTERTROCHANTRIC;  Surgeon: Johnny Bridge, MD;  Location: [place];  Service: Orthopedics;  Laterality:  Left;   ORIF FINGER FRACTURE  06/14/2011   Procedure: OPEN REDUCTION INTERNAL FIXATION (ORIF) METACARPAL (FINGER) FRACTURE;  Surgeon: Tennis Must, MD;  Location: [place];  Service: Orthopedics;  Laterality: Right;  right ring   POLYPECTOMY     TONSILLECTOMY     UPPER GASTROINTESTINAL ENDOSCOPY      There were no vitals filed for this visit.   Subjective Assessment - 02/06/21 1455     Subjective 80% better since the start of care. My biggest complaint is that I am not strong for lifting heavy objects.    Pertinent History Right hand dominant;  retiring Jul 13, 2021;  hip fracture 10 years ago    Currently in Pain? Yes    Pain Score 3     Pain Location Shoulder    Pain Orientation Right;Upper    Pain Descriptors / Indicators Aching;Dull    Pain Type Chronic pain    Pain Onset More than a month ago    Pain Frequency Constant    Aggravating Factors  holding trays at work, raising arm    Pain Relieving Factors movement, Celebrex                [facility] PT Assessment - 02/06/21 0001  Assessment   Medical Diagnosis impingement syndrome right shoulder    Referring Provider (PT) Dr. Betty Martinique      Prior Function   Level of Independence Independent      Observation/Other Assessments   Focus on Therapeutic Outcomes (FOTO)  57      AROM   Right Shoulder Flexion 165 Degrees   138 sitting   Right Shoulder ABduction 130 Degrees    Right Shoulder External Rotation 78 Degrees      Strength   Right Shoulder Flexion 4+/5    Right Shoulder ABduction 5/5    Right Shoulder Internal Rotation 5/5    Right Shoulder External Rotation 4+/5                           [facility] Adult PT Treatment/Exercise - 02/06/21 0001       Shoulder Exercises: ROM/Strengthening   UBE (Upper Arm Bike) Level 2x 6 minutes (3/3)- PT present to discuss progress                     PT Education - 02/06/21 1522     Education Details review of HEP using  visuals    Person(s) Educated Patient    Methods Explanation;Handout    Comprehension Verbalized understanding              PT Short Term Goals - 01/26/21 1719       PT SHORT TERM GOAL #4   Title The patient will have improved internal rotation to L1 needed for greater ease with dressing tasks    Baseline L5    Status On-going               PT Long Term Goals - 02/06/21 1457       PT LONG TERM GOAL #1   Title The patient will be independent with safe self progression of HEP    Status Achieved      PT LONG TERM GOAL #2   Title The patient will have improved flexion/scaption to 150 degrees needed for reaching overhead with greater ease      PT LONG TERM GOAL #3   Title The patient will have grossly 4-/5 to 4/5  shoulder/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  She tried to reduce use of Celebrex and notices increased pain when not taking it.  Rt shoulder A/ROM and strength are improved although pt reports end range shoulder pain with flexion overhead in sitting.  No pain in supine.  FOTO is improved to 57 indicating improved function overall.  Pt reports that she is limited with lifting and heavy activities with her Rt UE.  PT spent session reviewing all HEP with emphasis on scapular retraction to reduce impingement symptoms.  Previous  session focused on work modifications to reduce stress on Rt shoulder.  Pt will D/C to HEP today and follow-up with MD as needed.    PT Next Visit Plan D/C PT to HEP    PT Home Exercise Plan [email or web address]    Consulted and Agree with Plan of Care Patient             Patient will benefit from skilled therapeutic intervention in order to improve the following deficits and impairments:     Visit Diagnosis: Muscle weakness (generalized)  Acute pain of right shoulder  Cramp and spasm  Abnormal posture     Problem List Patient Active Problem List   Diagnosis Date Noted   Osteopenia/?Osteoporosis - followed by her gynecologist, Dr. Pamala Hurry 06/20/2012   Dyslipidemia 06/20/2012   H/O Turner syndrome 05/02/2012   Essential hypertension 02/03/2009   INFLAMMATORY BOWEL DISEASE - Followed by GI 02/03/2009   COLONIC POLYPS, HYPERPLASTIC, HX OF 02/03/2009  PHYSICAL THERAPY DISCHARGE SUMMARY  Visits from Start of Care: 13  Current functional level related to goals / functional outcomes: See above for current status.     Remaining deficits: Rt shoulder pain with work tasks.  Difficulty reaching and lifting overhead. Pt has HEP in place for strength progression.     Education / Equipment: HEP   Patient agrees to discharge. Patient goals were met. Patient is being discharged due to meeting the stated rehab goals.   Sigurd Sos, PT 02/06/21 3:27 PM  [facility] [address] [place], [place], [address] Phone: [phone]   Fax:  [phone]  Name: [name] MRN: [id number] Date of Birth: 06-May-1956

## 2021-02-16 ENCOUNTER — Other Ambulatory Visit (HOSPITAL_COMMUNITY): Payer: Self-pay

## 2021-03-08 ENCOUNTER — Other Ambulatory Visit (HOSPITAL_COMMUNITY): Payer: Self-pay

## 2021-03-15 ENCOUNTER — Other Ambulatory Visit (HOSPITAL_COMMUNITY): Payer: Self-pay

## 2021-03-23 ENCOUNTER — Other Ambulatory Visit (HOSPITAL_COMMUNITY): Payer: Self-pay

## 2021-03-28 ENCOUNTER — Other Ambulatory Visit (HOSPITAL_COMMUNITY): Payer: Self-pay

## 2021-03-29 ENCOUNTER — Encounter: Payer: Self-pay | Admitting: Family Medicine

## 2021-03-29 ENCOUNTER — Ambulatory Visit: Payer: 59 | Admitting: Family Medicine

## 2021-03-29 ENCOUNTER — Other Ambulatory Visit: Payer: Self-pay

## 2021-03-29 VITALS — BP 122/80 | HR 110 | Temp 98.3°F | Ht <= 58 in | Wt 151.0 lb

## 2021-03-29 DIAGNOSIS — I10 Essential (primary) hypertension: Secondary | ICD-10-CM | POA: Diagnosis not present

## 2021-03-29 DIAGNOSIS — E785 Hyperlipidemia, unspecified: Secondary | ICD-10-CM

## 2021-03-29 NOTE — Progress Notes (Signed)
[name] DOB: [birth year] Encounter date: 03/29/2021  This is a 65 y.o. female who presents with chronic condition visit, bp followup   History of present illness: Had been following with physical therapy for right shoulder pain. Seen by Dr. Martinique over summer. Even PT told her it won't heal completely with working. She is retiring in 3 months so expects shoulder will have time to heal then.   Just returned from [place] 48 hours ago.   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   HTN: hctz 12.5mg , lisinopril 30mg . Has been checking at home- this morning was 137/76. Didn't check in past week while on cruise.   HL: crestor 20mg  daily  No Known Allergies Current Meds  Medication Sig   Calcium Carbonate Antacid (CALCIUM CARBONATE PO) Take by mouth.   celecoxib (CELEBREX) 100 MG capsule Take 1 capsule (100 mg total) by mouth 2 (two) times daily as needed.   hydrochlorothiazide (MICROZIDE) 12.5 MG capsule Take 1 capsule (12.5 mg total) by mouth daily.   lisinopril (ZESTRIL) 30 MG tablet Take 1 tablet (30 mg total) by mouth daily.   rosuvastatin (CRESTOR) 20 MG tablet Take 1 tablet (20 mg total) by mouth daily.   tolterodine (DETROL LA) 4 MG 24 hr capsule Take 1 capsule (4 mg total) by mouth daily.   VITAMIN D PO Take 2,000 Units by mouth daily.   [DISCONTINUED] oxybutynin (DITROPAN XL) 15 MG 24 hr tablet TAKE 1 TABLET BY MOUTH EVERY 24 HOURS DAILY   Current Facility-Administered Medications for the 03/29/21 encounter (Office Visit) with Caren Macadam, MD  Medication   0.9 %  sodium chloride infusion    Review of Systems  Constitutional:  Negative for chills, fatigue and fever.  Respiratory:  Negative for cough, chest tightness, shortness of breath and wheezing.   Cardiovascular:  Negative for chest pain, palpitations and leg swelling.   Objective:  BP 122/80 (BP Location: Left  Arm, Patient Position: Sitting, Cuff Size: Normal)   Pulse (!) 110   Temp 98.3 F (36.8 C) (Oral)   Ht 4\' 8"  (1.422 m)   Wt 151 lb (68.5 kg)   BMI 33.85 kg/m   Weight: 151 lb (68.5 kg)   BP Readings from Last 3 Encounters:  03/29/21 122/80  12/05/20 128/70  09/14/20 130/60   Wt Readings from Last 3 Encounters:  03/29/21 151 lb (68.5 kg)  12/05/20 138 lb 2 oz (62.7 kg)  09/14/20 145 lb 9.6 oz (66 kg)    Physical Exam Constitutional:      General: She is not in acute distress.    Appearance: She is well-developed.  Cardiovascular:     Rate and Rhythm: Normal rate and regular rhythm.     Heart sounds: Normal heart sounds. No murmur heard.   No friction rub.  Pulmonary:     Effort: Pulmonary effort is normal. No respiratory distress.     Breath sounds: Normal breath sounds. No wheezing or rales.  Musculoskeletal:     Right lower leg: No edema.     Left lower leg: No edema.  Neurological:     Mental Status: She is alert and oriented to person, place, and time.  Psychiatric:        Behavior: Behavior normal.    Assessment/Plan  1. Essential hypertension Continue with lisinopril 30 mg daily, hydrochlorothiazide 12.5 mg daily.  Blood pressures well controlled. - CBC with Differential/Platelet;  Future  2. Dyslipidemia Tolerating Crestor 20 mg daily.  Continue with this medication at present dose. - Comprehensive metabolic panel; Future - Lipid panel; Future   Return for bloodwork in 3 months; follow up in 6 months CCV.     Micheline Rough, MD

## 2021-03-31 ENCOUNTER — Ambulatory Visit: Payer: 59

## 2021-04-03 ENCOUNTER — Ambulatory Visit
Admission: RE | Admit: 2021-04-03 | Discharge: 2021-04-03 | Disposition: A | Discharge status: Home / Self Care | Payer: 59 | Source: Ambulatory Visit | Attending: Obstetrics | Admitting: Obstetrics

## 2021-04-03 ENCOUNTER — Other Ambulatory Visit: Payer: Self-pay

## 2021-04-03 DIAGNOSIS — Z1231 Encounter for screening mammogram for malignant neoplasm of breast: Secondary | ICD-10-CM

## 2021-04-26 ENCOUNTER — Telehealth: Payer: Self-pay

## 2021-04-26 NOTE — Telephone Encounter (Signed)
   Patient calling in with respiratory symptoms: Shortness of breath, chest pain, palpitations or other red words send to Triage  Does the patient have a fever over 100, cough, congestion, sore throat, runny nose, lost of taste/smell within the last 5 days (please list symptoms that patient has)? Yes Cough and mucus in throat   Have you tested for Covid in the last 5 days? Yes   If yes, was it positive []  OR negative [x] ? If positive in the last 5 days, please schedule virtual visit now. If negative, schedule for an in person OV with the next available provider if PCP has no openings. Please also let patient know they will be tested again (follow the script below)  "you will have to arrive 87mins prior to your appt time to be Covid tested. Please park in back of office at the [facility] & call [phone] to let the staff know you have arrived. A staff member will meet you at your car to do a rapid covid test. Once the test has resulted you will be notified by phone of your results to determine if appt will remain an in person visit or be converted to a virtual/phone visit. If you arrive less than 38mins before your appt time, your visit will be automatically converted to virtual & any recommended testing will happen AFTER the visit."   [place]  If no availability for virtual visit in office,  please schedule another [place] office  If no availability at another [place] office, please instruct patient that they can schedule an evisit or virtual visit through their mychart account. Visits up to 8pm  patients can be seen in office 5 days after positive COVID test

## 2021-04-27 ENCOUNTER — Encounter: Payer: Self-pay | Admitting: Family Medicine

## 2021-04-27 ENCOUNTER — Ambulatory Visit: Payer: 59 | Admitting: Family Medicine

## 2021-04-27 VITALS — BP 128/82 | HR 91 | Temp 98.9°F | Wt 148.8 lb

## 2021-04-27 DIAGNOSIS — R059 Cough, unspecified: Secondary | ICD-10-CM

## 2021-04-27 DIAGNOSIS — R0982 Postnasal drip: Secondary | ICD-10-CM

## 2021-04-27 LAB — POC COVID19 BINAXNOW: SARS Coronavirus 2 Ag: NEGATIVE

## 2021-04-27 MED ORDER — LEVOCETIRIZINE DIHYDROCHLORIDE 5 MG PO TABS: 5.0000 mg | ORAL_TABLET | Freq: Every evening | ORAL | 0 refills | Status: DC

## 2021-04-27 MED ORDER — FLUTICASONE PROPIONATE 50 MCG/ACT NA SUSP: 1.0000 | Freq: Every day | NASAL | 0 refills | Status: DC

## 2021-04-27 NOTE — Progress Notes (Signed)
Subjective:    Patient ID: [name], female    DOB: [birth year], 65 y.o.   MRN: [id number]  Chief Complaint  Patient presents with   Cough    Cough an mucus, feels fine and is functioning. Feels like mucus in throat and starts coughing to try to get it out, tried mucinex but has not helped.    HPI Patient was seen today for acute concern.  Pt with intermittent cough x 10 days.  Feels like something is building up in throat.  Endorses throat irritation/mild soreness.  Denies fever, chills, wheezing, HA, n/v, ear pain/pressure, facial pain/pressure, rhinorrhea, dysphagia.  Pt is a non smoker.  Tried mucinex.  Past Medical History:  Diagnosis Date   B12 DEFICIENCY 02/09/2009   Qualifier: Diagnosis of  By: Ronnald Ramp RN, CGRN, Sheri     Blood in stool    COLONIC POLYPS, HYPERPLASTIC, HX OF 02/03/2009   Qualifier: Diagnosis of  By: Surface RN, Donna     DIVERTICULOSIS, COLON 02/03/2009   Qualifier: Diagnosis of  By: Surface RN, Donna     Dyslipidemia 06/20/2012   Fracture, intertrochanteric, left femur ([place]) 05/02/2012   GERD (gastroesophageal reflux disease)    Hearing loss    Bil/has hearing aids   Hemorrhoids    HEMORRHOIDS 02/03/2009   Qualifier: Diagnosis of  By: Surface RN, Butch Penny     Hypertension    INFLAMMATORY BOWEL DISEASE - Followed by Dr. 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 and d/c this  at age 71yr   Ulcerative colitis     No Known Allergies  ROS General: Denies fever, chills, night sweats, changes in weight, changes in appetite HEENT: Denies headaches, ear pain, changes in vision, rhinorrhea, sore throat CV: Denies CP, palpitations, SOB, orthopnea Pulm: Denies SOB, wheezing +cough GI: Denies abdominal pain, nausea, vomiting, diarrhea, constipation GU: Denies dysuria, hematuria, frequency, vaginal discharge Msk: Denies muscle cramps, joint pains Neuro: Denies  weakness, numbness, tingling Skin: Denies rashes, bruising Psych: Denies depression, anxiety, hallucinations     Objective:    Blood pressure 128/82, pulse 91, temperature 98.9 F (37.2 C), temperature source Oral, weight 148 lb 12.8 oz (67.5 kg), SpO2 98 %.  Gen. Pleasant, well-nourished, in no distress, normal affect   HEENT: [place]/AT, face symmetric, conjunctiva clear, no scleral icterus, PERRLA, EOMI, nares patent with mild edema and clear drainage, pharynx with clear postnasal drainage and mild erythema, no exudate.  Wearing hearing aids.  TMs normal bilaterally.  Right deep cervical lymphadenopathy. Lungs: dry cough, no accessory muscle use, CTAB, no wheezes or rales Cardiovascular: RRR, no m/r/g, no peripheral edema Musculoskeletal: No deformities, no cyanosis or clubbing, normal tone Neuro:  A&Ox3, CN II-XII intact, normal gait Skin:  Warm, no lesions/ rash   Wt Readings from Last 3 Encounters:  04/27/21 148 lb 12.8 oz (67.5 kg)  03/29/21 151 lb (68.5 kg)  12/05/20 138 lb 2 oz (62.7 kg)    Lab Results  Component Value Date   WBC 4.0 09/14/2020   HGB 13.4 09/14/2020   HCT 39.3 09/14/2020   PLT 166.0 09/14/2020   GLUCOSE 92 09/14/2020   CHOL 162 09/14/2020   TRIG 65.0 09/14/2020   HDL 65.20 09/14/2020   LDLDIRECT 157.8 09/10/2012   LDLCALC 83 09/14/2020   ALT 26 09/14/2020   AST 24 09/14/2020   NA 139 09/14/2020  K 3.9 09/14/2020   CL 102 09/14/2020   CREATININE 0.72 09/14/2020   BUN 14 09/14/2020   CO2 28 09/14/2020   INR 2.34 (H) 05/10/2012   HGBA1C 5.4 06/03/2017    Assessment/Plan:  Post-nasal drainage  -POC covid test negative. - Plan: fluticasone (FLONASE) 50 MCG/ACT nasal spray, levocetirizine (XYZAL) 5 MG tablet  Cough, unspecified type  -likely 2/2 post nasal drainage. -start antihistamine and flonase -continue supportive care. -given precautions for continued or new symptoms - Plan: POC COVID-19, fluticasone (FLONASE) 50 MCG/ACT nasal spray,  levocetirizine (XYZAL) 5 MG tablet  F/u prn  Grier Mitts, MD

## 2021-05-09 ENCOUNTER — Other Ambulatory Visit (HOSPITAL_COMMUNITY): Payer: Self-pay

## 2021-06-16 ENCOUNTER — Other Ambulatory Visit (HOSPITAL_COMMUNITY): Payer: Self-pay

## 2021-07-06 ENCOUNTER — Telehealth: Payer: Self-pay | Admitting: Family Medicine

## 2021-07-06 ENCOUNTER — Other Ambulatory Visit: Payer: Self-pay | Admitting: Family Medicine

## 2021-07-06 ENCOUNTER — Other Ambulatory Visit (HOSPITAL_COMMUNITY): Payer: Self-pay

## 2021-07-06 DIAGNOSIS — I10 Essential (primary) hypertension: Secondary | ICD-10-CM

## 2021-07-06 MED ORDER — HYDROCHLOROTHIAZIDE 12.5 MG PO CAPS
12.5000 mg | ORAL_CAPSULE | Freq: Every day | ORAL | 1 refills | Status: DC
  Filled 2021-07-06: qty 90, 90d supply, fill #0

## 2021-07-06 MED ORDER — ROSUVASTATIN CALCIUM 20 MG PO TABS
20.0000 mg | ORAL_TABLET | Freq: Every day | ORAL | 0 refills | Status: DC
  Filled 2021-07-06: qty 90, 90d supply, fill #0

## 2021-07-06 NOTE — Telephone Encounter (Signed)
Pt said the physical therapy left a note for dr Ethlyn Gallery. Pt is still having shoulder pain and  still doing PT would like to know if dr Ethlyn Gallery would send in celecoxib (CELEBREX) 100 MG capsule  [facility] Phone:  [phone]  Fax:  [phone]

## 2021-07-07 ENCOUNTER — Other Ambulatory Visit (HOSPITAL_COMMUNITY): Payer: Self-pay

## 2021-07-07 ENCOUNTER — Other Ambulatory Visit: Payer: Self-pay | Admitting: Family Medicine

## 2021-07-07 DIAGNOSIS — G8929 Other chronic pain: Secondary | ICD-10-CM

## 2021-07-07 MED ORDER — CELECOXIB 100 MG PO CAPS
100.0000 mg | ORAL_CAPSULE | Freq: Two times a day (BID) | ORAL | 1 refills | Status: DC | PRN
  Filled 2021-07-07: qty 60, 30d supply, fill #0

## 2021-07-07 NOTE — Telephone Encounter (Signed)
Patient informed of the message below.

## 2021-07-07 NOTE — Telephone Encounter (Signed)
Sure. If not feeling better after PT complete or if worsening please let me know.

## 2021-07-26 ENCOUNTER — Other Ambulatory Visit (HOSPITAL_COMMUNITY): Payer: Self-pay

## 2021-07-27 ENCOUNTER — Other Ambulatory Visit (HOSPITAL_COMMUNITY): Payer: Self-pay

## 2021-07-27 DIAGNOSIS — Z961 Presence of intraocular lens: Secondary | ICD-10-CM | POA: Diagnosis not present

## 2021-07-28 ENCOUNTER — Other Ambulatory Visit (HOSPITAL_COMMUNITY): Payer: Self-pay

## 2021-07-28 ENCOUNTER — Other Ambulatory Visit: Payer: Self-pay | Admitting: *Deleted

## 2021-07-28 DIAGNOSIS — I10 Essential (primary) hypertension: Secondary | ICD-10-CM

## 2021-07-28 MED ORDER — ROSUVASTATIN CALCIUM 20 MG PO TABS: 20.0000 mg | ORAL_TABLET | Freq: Every day | ORAL | 0 refills | Status: DC

## 2021-07-28 MED ORDER — HYDROCHLOROTHIAZIDE 12.5 MG PO CAPS: 12.5000 mg | ORAL_CAPSULE | Freq: Every day | ORAL | 0 refills | Status: DC

## 2021-07-28 MED ORDER — LISINOPRIL 30 MG PO TABS: 30.0000 mg | ORAL_TABLET | Freq: Every day | ORAL | 0 refills | Status: DC

## 2021-08-02 ENCOUNTER — Telehealth: Payer: Self-pay | Admitting: Family Medicine

## 2021-08-02 NOTE — Telephone Encounter (Signed)
Cleotis Nipper pharm tech  ?Occupational hygienist [facility]) [place], [place] Phone:  [phone]  ?Fax:  [phone]  ?  ?Is calling to see if md would authorize  tolterodine (DETROL LA) 4 MG 24 hr capsule ?

## 2021-08-03 NOTE — Telephone Encounter (Signed)
Ok. Do I need to send rx? Or just verbal ok? ?

## 2021-08-04 ENCOUNTER — Other Ambulatory Visit: Payer: Self-pay

## 2021-08-04 MED ORDER — TOLTERODINE TARTRATE ER 4 MG PO CP24: 4.0000 mg | ORAL_CAPSULE | Freq: Every day | ORAL | 10 refills | Status: DC

## 2021-08-04 NOTE — Telephone Encounter (Signed)
Rx sent 

## 2021-08-07 ENCOUNTER — Ambulatory Visit
Admission: RE | Admit: 2021-08-07 | Discharge: 2021-08-07 | Disposition: A | Discharge status: Home / Self Care | Payer: Medicare Other | Source: Ambulatory Visit | Attending: Obstetrics | Admitting: Obstetrics

## 2021-08-07 DIAGNOSIS — M8589 Other specified disorders of bone density and structure, multiple sites: Secondary | ICD-10-CM | POA: Diagnosis not present

## 2021-08-07 DIAGNOSIS — Z780 Asymptomatic menopausal state: Secondary | ICD-10-CM | POA: Diagnosis not present

## 2021-08-07 DIAGNOSIS — M810 Age-related osteoporosis without current pathological fracture: Secondary | ICD-10-CM

## 2021-08-21 DIAGNOSIS — M8580 Other specified disorders of bone density and structure, unspecified site: Secondary | ICD-10-CM | POA: Diagnosis not present

## 2021-10-03 DIAGNOSIS — L814 Other melanin hyperpigmentation: Secondary | ICD-10-CM | POA: Diagnosis not present

## 2021-10-03 DIAGNOSIS — D1801 Hemangioma of skin and subcutaneous tissue: Secondary | ICD-10-CM | POA: Diagnosis not present

## 2021-10-03 DIAGNOSIS — D485 Neoplasm of uncertain behavior of skin: Secondary | ICD-10-CM | POA: Diagnosis not present

## 2021-10-03 DIAGNOSIS — L821 Other seborrheic keratosis: Secondary | ICD-10-CM | POA: Diagnosis not present

## 2021-10-09 DIAGNOSIS — Z124 Encounter for screening for malignant neoplasm of cervix: Secondary | ICD-10-CM | POA: Diagnosis not present

## 2021-10-09 DIAGNOSIS — Z01419 Encounter for gynecological examination (general) (routine) without abnormal findings: Secondary | ICD-10-CM | POA: Diagnosis not present

## 2021-10-09 DIAGNOSIS — Z0142 Encounter for cervical smear to confirm findings of recent normal smear following initial abnormal smear: Secondary | ICD-10-CM | POA: Diagnosis not present

## 2021-10-09 DIAGNOSIS — Z01411 Encounter for gynecological examination (general) (routine) with abnormal findings: Secondary | ICD-10-CM | POA: Diagnosis not present

## 2021-10-17 ENCOUNTER — Other Ambulatory Visit: Payer: Self-pay

## 2021-10-23 ENCOUNTER — Encounter: Payer: Self-pay | Admitting: Family

## 2021-10-23 ENCOUNTER — Ambulatory Visit (INDEPENDENT_AMBULATORY_CARE_PROVIDER_SITE_OTHER): Payer: Medicare Other

## 2021-10-23 ENCOUNTER — Ambulatory Visit (INDEPENDENT_AMBULATORY_CARE_PROVIDER_SITE_OTHER): Payer: Medicare Other | Admitting: Family

## 2021-10-23 VITALS — BP 118/60 | HR 93 | Temp 97.3°F | Ht <= 58 in | Wt 151.1 lb

## 2021-10-23 DIAGNOSIS — E785 Hyperlipidemia, unspecified: Secondary | ICD-10-CM | POA: Diagnosis not present

## 2021-10-23 DIAGNOSIS — M25511 Pain in right shoulder: Secondary | ICD-10-CM

## 2021-10-23 DIAGNOSIS — I10 Essential (primary) hypertension: Secondary | ICD-10-CM

## 2021-10-23 DIAGNOSIS — E876 Hypokalemia: Secondary | ICD-10-CM | POA: Diagnosis not present

## 2021-10-23 LAB — BASIC METABOLIC PANEL
BUN: 26 mg/dL — ABNORMAL HIGH (ref 6–23)
CO2: 27 mEq/L (ref 19–32)
Calcium: 9.6 mg/dL (ref 8.4–10.5)
Chloride: 100 mEq/L (ref 96–112)
Creatinine, Ser: 0.92 mg/dL (ref 0.40–1.20)
GFR: 65.45 mL/min (ref >60.00)
Glucose, Bld: 84 mg/dL (ref 70–99)
Potassium: 3.4 mEq/L — ABNORMAL LOW (ref 3.5–5.1)
Sodium: 137 mEq/L (ref 135–145)

## 2021-10-23 LAB — CBC WITH DIFFERENTIAL/PLATELET
Basophils Absolute: 0 10*3/uL (ref 0.0–0.1)
Basophils Relative: 0.5 % (ref 0.0–3.0)
Eosinophils Absolute: 0.1 10*3/uL (ref 0.0–0.7)
Eosinophils Relative: 1.1 % (ref 0.0–5.0)
HCT: 39 % (ref 36.0–46.0)
Hemoglobin: 13 g/dL (ref 12.0–15.0)
Lymphocytes Relative: 31.7 % (ref 12.0–46.0)
Lymphs Abs: 1.7 10*3/uL (ref 0.7–4.0)
MCHC: 33.2 g/dL (ref 30.0–36.0)
MCV: 90.9 fl (ref 78.0–100.0)
Monocytes Absolute: 0.7 10*3/uL (ref 0.1–1.0)
Monocytes Relative: 12.9 % — ABNORMAL HIGH (ref 3.0–12.0)
Neutro Abs: 2.8 10*3/uL (ref 1.4–7.7)
Neutrophils Relative %: 53.8 % (ref 43.0–77.0)
Platelets: 193 10*3/uL (ref 150.0–400.0)
RBC: 4.29 Mil/uL (ref 3.87–5.11)
RDW: 13.4 % (ref 11.5–15.5)
WBC: 5.3 10*3/uL (ref 4.0–10.5)

## 2021-10-23 LAB — LIPID PANEL
Cholesterol: 158 mg/dL (ref 0–200)
HDL: 44.6 mg/dL (ref >39.00)
LDL Cholesterol: 91 mg/dL (ref 0–99)
NonHDL: 113
Total CHOL/HDL Ratio: 4
Triglycerides: 112 mg/dL (ref 0.0–149.0)
VLDL: 22.4 mg/dL (ref 0.0–40.0)

## 2021-10-24 ENCOUNTER — Other Ambulatory Visit: Payer: Self-pay | Admitting: Family

## 2021-10-24 DIAGNOSIS — G8929 Other chronic pain: Secondary | ICD-10-CM

## 2021-10-24 NOTE — Progress Notes (Unsigned)
Acute Office Visit  Subjective:     Patient ID: [name], female    DOB: 1956/05/06, 66 y.o.   MRN: [id number]  Chief Complaint  Patient presents with  . Follow-up    HPI Patient is in today for a follow-up of HTN and HLD. She reports weight gain and now back on weight watchers. She is retired and reports COVID made her less active than before. Tolerating medications well.   Patient also reports having right shoulder pain, 7/10. Pain wakes her up at night. Described as sharp. She reports decreased range of motion in the right shoulder. Denies any injury.   Review of Systems  HENT: Negative.    Respiratory: Negative.    Cardiovascular: Negative.   Musculoskeletal:  Positive for joint pain.       Right shoulder pain  Neurological: Negative.   Psychiatric/Behavioral: Negative.    All other systems reviewed and are negative.      Objective:    BP 118/60 (BP Location: Left Arm, Patient Position: Sitting, Cuff Size: Normal)   Pulse 93   Temp (!) 97.3 F (36.3 C) (Oral)   Ht '4\' 8"'$  (1.422 m)   Wt 151 lb 1.6 oz (68.5 kg)   SpO2 100%   BMI 33.88 kg/m  {Vitals History (Optional):23777}  Physical Exam Vitals and nursing note reviewed.  Constitutional:      Appearance: Normal appearance.  HENT:     Mouth/Throat:     Mouth: Mucous membranes are moist.  Cardiovascular:     Rate and Rhythm: Normal rate and regular rhythm.  Pulmonary:     Effort: Pulmonary effort is normal.     Breath sounds: Normal breath sounds.  Abdominal:     General: Abdomen is flat.     Palpations: Abdomen is soft.  Musculoskeletal:        General: Normal range of motion.     Cervical back: Normal range of motion and neck supple.     Comments: Unable to lift her arm above her head. Non-tender  Skin:    General: Skin is warm and dry.  Neurological:     General: No focal deficit present.     Mental Status: She is alert and oriented to person, place, and time.  Psychiatric:        Mood and  Affect: Mood normal.        Behavior: Behavior normal.   Results for orders placed or performed in visit on 82/42/35  Basic Metabolic Panel  Result Value Ref Range   Sodium 137 135 - 145 mEq/L   Potassium 3.4 (L) 3.5 - 5.1 mEq/L   Chloride 100 96 - 112 mEq/L   CO2 27 19 - 32 mEq/L   Glucose, Bld 84 70 - 99 mg/dL   BUN 26 (H) 6 - 23 mg/dL   Creatinine, Ser 0.92 0.40 - 1.20 mg/dL   GFR 65.45 >60.00 mL/min   Calcium 9.6 8.4 - 10.5 mg/dL  Lipid Panel  Result Value Ref Range   Cholesterol 158 0 - 200 mg/dL   Triglycerides 112.0 0.0 - 149.0 mg/dL   HDL 44.60 >39.00 mg/dL   VLDL 22.4 0.0 - 40.0 mg/dL   LDL Cholesterol 91 0 - 99 mg/dL   Total CHOL/HDL Ratio 4    NonHDL 113.00   CBC with Differential/Platelets  Result Value Ref Range   WBC 5.3 4.0 - 10.5 K/uL   RBC 4.29 3.87 - 5.11 Mil/uL   Hemoglobin 13.0 12.0 - 15.0 g/dL  HCT 39.0 36.0 - 46.0 %   MCV 90.9 78.0 - 100.0 fl   MCHC 33.2 30.0 - 36.0 g/dL   RDW 13.4 11.5 - 15.5 %   Platelets 193.0 150.0 - 400.0 K/uL   Neutrophils Relative % 53.8 43.0 - 77.0 %   Lymphocytes Relative 31.7 12.0 - 46.0 %   Monocytes Relative 12.9 (H) 3.0 - 12.0 %   Eosinophils Relative 1.1 0.0 - 5.0 %   Basophils Relative 0.5 0.0 - 3.0 %   Neutro Abs 2.8 1.4 - 7.7 K/uL   Lymphs Abs 1.7 0.7 - 4.0 K/uL   Monocytes Absolute 0.7 0.1 - 1.0 K/uL   Eosinophils Absolute 0.1 0.0 - 0.7 K/uL   Basophils Absolute 0.0 0.0 - 0.1 K/uL        Assessment & Plan:   Problem List Items Addressed This Visit     Essential hypertension - Primary   Relevant Orders   Basic Metabolic Panel (Completed)   Lipid Panel (Completed)   CBC with Differential/Platelets (Completed)   Dyslipidemia   Relevant Orders   Basic Metabolic Panel (Completed)   Lipid Panel (Completed)   CBC with Differential/Platelets (Completed)   Other Visit Diagnoses     Acute pain of right shoulder       Relevant Orders   DG Shoulder Right (Completed)   Hypokalemia       Relevant Orders    Basic metabolic panel       No orders of the defined types were placed in this encounter.   No follow-ups on file.  Kennyth Arnold, FNP

## 2021-11-03 ENCOUNTER — Other Ambulatory Visit (INDEPENDENT_AMBULATORY_CARE_PROVIDER_SITE_OTHER): Payer: Medicare Other

## 2021-11-03 DIAGNOSIS — I10 Essential (primary) hypertension: Secondary | ICD-10-CM

## 2021-11-03 DIAGNOSIS — E876 Hypokalemia: Secondary | ICD-10-CM | POA: Diagnosis not present

## 2021-11-03 DIAGNOSIS — E785 Hyperlipidemia, unspecified: Secondary | ICD-10-CM | POA: Diagnosis not present

## 2021-11-03 LAB — CBC WITH DIFFERENTIAL/PLATELET
Basophils Absolute: 0 10*3/uL (ref 0.0–0.1)
Basophils Relative: 0.3 % (ref 0.0–3.0)
Eosinophils Absolute: 0 10*3/uL (ref 0.0–0.7)
Eosinophils Relative: 0.7 % (ref 0.0–5.0)
HCT: 37 % (ref 36.0–46.0)
Hemoglobin: 12.3 g/dL (ref 12.0–15.0)
Lymphocytes Relative: 33 % (ref 12.0–46.0)
Lymphs Abs: 1.9 10*3/uL (ref 0.7–4.0)
MCHC: 33.1 g/dL (ref 30.0–36.0)
MCV: 91.7 fl (ref 78.0–100.0)
Monocytes Absolute: 0.5 10*3/uL (ref 0.1–1.0)
Monocytes Relative: 9.2 % (ref 3.0–12.0)
Neutro Abs: 3.3 10*3/uL (ref 1.4–7.7)
Neutrophils Relative %: 56.8 % (ref 43.0–77.0)
Platelets: 172 10*3/uL (ref 150.0–400.0)
RBC: 4.04 Mil/uL (ref 3.87–5.11)
RDW: 13.6 % (ref 11.5–15.5)
WBC: 5.9 10*3/uL (ref 4.0–10.5)

## 2021-11-03 LAB — COMPREHENSIVE METABOLIC PANEL
ALT: 30 U/L (ref 0–35)
AST: 22 U/L (ref 0–37)
Albumin: 4.3 g/dL (ref 3.5–5.2)
Alkaline Phosphatase: 68 U/L (ref 39–117)
BUN: 22 mg/dL (ref 6–23)
CO2: 28 mEq/L (ref 19–32)
Calcium: 9.3 mg/dL (ref 8.4–10.5)
Chloride: 103 mEq/L (ref 96–112)
Creatinine, Ser: 0.84 mg/dL (ref 0.40–1.20)
GFR: 72.98 mL/min (ref >60.00)
Glucose, Bld: 86 mg/dL (ref 70–99)
Potassium: 4 mEq/L (ref 3.5–5.1)
Sodium: 141 mEq/L (ref 135–145)
Total Bilirubin: 0.6 mg/dL (ref 0.2–1.2)
Total Protein: 6.6 g/dL (ref 6.0–8.3)

## 2021-11-03 LAB — LIPID PANEL
Cholesterol: 149 mg/dL (ref 0–200)
HDL: 47.3 mg/dL (ref >39.00)
LDL Cholesterol: 78 mg/dL (ref 0–99)
NonHDL: 101.29
Total CHOL/HDL Ratio: 3
Triglycerides: 118 mg/dL (ref 0.0–149.0)
VLDL: 23.6 mg/dL (ref 0.0–40.0)

## 2021-11-06 DIAGNOSIS — M19011 Primary osteoarthritis, right shoulder: Secondary | ICD-10-CM | POA: Diagnosis not present

## 2021-11-06 DIAGNOSIS — M7551 Bursitis of right shoulder: Secondary | ICD-10-CM | POA: Diagnosis not present

## 2021-11-06 DIAGNOSIS — M7541 Impingement syndrome of right shoulder: Secondary | ICD-10-CM | POA: Diagnosis not present

## 2021-12-18 DIAGNOSIS — M7541 Impingement syndrome of right shoulder: Secondary | ICD-10-CM | POA: Diagnosis not present

## 2021-12-18 DIAGNOSIS — M19011 Primary osteoarthritis, right shoulder: Secondary | ICD-10-CM | POA: Diagnosis not present

## 2021-12-18 DIAGNOSIS — M7551 Bursitis of right shoulder: Secondary | ICD-10-CM | POA: Diagnosis not present

## 2021-12-25 DIAGNOSIS — M25511 Pain in right shoulder: Secondary | ICD-10-CM | POA: Diagnosis not present

## 2021-12-29 ENCOUNTER — Other Ambulatory Visit: Payer: Self-pay | Admitting: Specialist

## 2021-12-29 DIAGNOSIS — G8929 Other chronic pain: Secondary | ICD-10-CM

## 2022-01-01 ENCOUNTER — Telehealth: Payer: Self-pay | Admitting: Family Medicine

## 2022-01-01 ENCOUNTER — Other Ambulatory Visit: Payer: Self-pay | Admitting: Family Medicine

## 2022-01-01 NOTE — Telephone Encounter (Signed)
Pt call need a refill on her lisinopril (ZESTRIL) 30 MG tablet sent to  [facility] ([place] [place], [place] Phone:  [phone]  Fax:  [phone]

## 2022-01-02 MED ORDER — LISINOPRIL 30 MG PO TABS: 30.0000 mg | ORAL_TABLET | Freq: Every day | ORAL | 0 refills | Status: DC

## 2022-01-08 ENCOUNTER — Ambulatory Visit
Admission: RE | Admit: 2022-01-08 | Discharge: 2022-01-08 | Disposition: A | Discharge status: Home / Self Care | Payer: Medicare Other | Source: Ambulatory Visit | Attending: Specialist | Admitting: Specialist

## 2022-01-08 DIAGNOSIS — M25511 Pain in right shoulder: Secondary | ICD-10-CM | POA: Diagnosis not present

## 2022-01-08 DIAGNOSIS — G8929 Other chronic pain: Secondary | ICD-10-CM | POA: Diagnosis not present

## 2022-01-13 LAB — SYNOVIAL FLUID ANALYSIS, COMPLETE
Basophils, %: 0 %
Eosinophils-Synovial: 0 % (ref 0–2)
Lymphocytes-Synovial Fld: 36 % (ref 0–74)
Monocyte/Macrophage: 19 % (ref 0–69)
Neutrophil, Synovial: 45 % — ABNORMAL HIGH (ref 0–24)
Synoviocytes, %: 1 % (ref 0–15)
WBC, Synovial: 1518 cells/uL — ABNORMAL HIGH (ref <150)

## 2022-01-13 LAB — ANAEROBIC AND AEROBIC CULTURE
AER RESULT:: NO GROWTH
MICRO NUMBER:: 13807212
MICRO NUMBER:: 13807213
SPECIMEN QUALITY:: ADEQUATE
SPECIMEN QUALITY:: ADEQUATE

## 2022-01-15 DIAGNOSIS — M7551 Bursitis of right shoulder: Secondary | ICD-10-CM | POA: Diagnosis not present

## 2022-01-15 DIAGNOSIS — M7541 Impingement syndrome of right shoulder: Secondary | ICD-10-CM | POA: Diagnosis not present

## 2022-01-15 DIAGNOSIS — M19011 Primary osteoarthritis, right shoulder: Secondary | ICD-10-CM | POA: Diagnosis not present

## 2022-02-14 DIAGNOSIS — M19011 Primary osteoarthritis, right shoulder: Secondary | ICD-10-CM | POA: Diagnosis not present

## 2022-02-14 NOTE — Telephone Encounter (Signed)
ERROR

## 2022-02-26 ENCOUNTER — Encounter: Payer: Medicare Other | Admitting: Family Medicine

## 2022-03-06 ENCOUNTER — Telehealth: Payer: Self-pay | Admitting: Family Medicine

## 2022-03-06 DIAGNOSIS — I10 Essential (primary) hypertension: Secondary | ICD-10-CM

## 2022-03-06 MED ORDER — ROSUVASTATIN CALCIUM 20 MG PO TABS: 20.0000 mg | ORAL_TABLET | Freq: Every day | ORAL | 0 refills | Status: DC

## 2022-03-06 NOTE — Telephone Encounter (Signed)
Refill rosuvastatin (CRESTOR) 20 MG tablet

## 2022-03-06 NOTE — Telephone Encounter (Signed)
Rx done. 

## 2022-03-06 NOTE — Telephone Encounter (Signed)
Ok to refill 

## 2022-03-20 ENCOUNTER — Encounter: Payer: Self-pay | Admitting: Family Medicine

## 2022-03-20 ENCOUNTER — Ambulatory Visit (INDEPENDENT_AMBULATORY_CARE_PROVIDER_SITE_OTHER): Payer: Medicare Other | Admitting: Family Medicine

## 2022-03-20 VITALS — BP 122/64 | HR 97 | Temp 98.4°F | Ht <= 58 in | Wt 160.0 lb

## 2022-03-20 DIAGNOSIS — I10 Essential (primary) hypertension: Secondary | ICD-10-CM | POA: Diagnosis not present

## 2022-03-20 DIAGNOSIS — Z23 Encounter for immunization: Secondary | ICD-10-CM

## 2022-03-20 DIAGNOSIS — G4700 Insomnia, unspecified: Secondary | ICD-10-CM | POA: Diagnosis not present

## 2022-03-20 DIAGNOSIS — Z1231 Encounter for screening mammogram for malignant neoplasm of breast: Secondary | ICD-10-CM | POA: Diagnosis not present

## 2022-03-20 MED ORDER — TRAZODONE HCL 50 MG PO TABS: 25.0000 mg | ORAL_TABLET | Freq: Every evening | ORAL | 0 refills | Status: DC | PRN

## 2022-03-20 NOTE — Progress Notes (Signed)
Established Patient Office Visit  Subjective   Patient ID: [name], female    DOB: Apr 08, 1956  Age: 66 y.o. MRN: [id number]  Chief Complaint  Patient presents with   Establish Care    Patient is here for transition of care visit.   Insomnia-- Patient states she retired in March 2023, however her sleep has been worsening since that time. She reports that she falls asleep ok, usually falls asleep around 10:30-11, then randomly waking up every 2 hours at night. No heavy snoring, no gasping for air at night but she has a significant amount of daytime fatigue.states she takes 10 mg of melatonin at night. Patient does have an exercise routine, [facility], also joined [employer] and does water walking twice a week.  Right shoulder pain-- patient is taking celebrex daily for her shoulder pain, states that she is in need of a shoulder replacement, is already seeing the orthopedist for this problem. States she thinks her surgery will be around December.   HTN -- BP in office performed and is well controlled. She  reports no side effects to the medications, no chest pain, SOB, dizziness or headaches. She has a BP cuff at home and is checking BP regularly, reports they are in the normal range.       Current Outpatient Medications  Medication Instructions   alendronate (FOSAMAX) 70 mg, Oral, Weekly   celecoxib (CELEBREX) 100 mg, Oral, 2 times daily PRN   hydrochlorothiazide (MICROZIDE) 12.5 mg, Oral, Daily   lisinopril (ZESTRIL) 30 mg, Oral, Daily   rosuvastatin (CRESTOR) 20 mg, Oral, Daily   tolterodine (DETROL LA) 4 mg, Oral, Daily   traZODone (DESYREL) 25-50 mg, Oral, At bedtime PRN   VITAMIN D PO 2,000 Units, Oral, Daily    Patient Active Problem List   Diagnosis Date Noted   Osteopenia/?Osteoporosis - followed by her gynecologist, Dr. Pamala Hurry 06/20/2012   Dyslipidemia 06/20/2012   H/O Turner syndrome 05/02/2012   Essential hypertension 02/03/2009    INFLAMMATORY BOWEL DISEASE - Followed by GI 02/03/2009   COLONIC POLYPS, HYPERPLASTIC, HX OF 02/03/2009      Review of Systems  All other systems reviewed and are negative.     Objective:     BP 122/64 (BP Location: Left Arm, Patient Position: Sitting, Cuff Size: Normal)   Pulse 97   Temp 98.4 F (36.9 C) (Oral)   Ht '4\' 8"'$  (1.422 m)   Wt 160 lb (72.6 kg)   SpO2 99%   BMI 35.87 kg/m    Physical Exam Vitals reviewed.  Constitutional:      Appearance: Normal appearance. She is well-groomed. She is obese.  Eyes:     Conjunctiva/sclera: Conjunctivae normal.  Neck:     Thyroid: No thyromegaly.  Cardiovascular:     Rate and Rhythm: Normal rate and regular rhythm.     Pulses: Normal pulses.     Heart sounds: S1 normal and S2 normal.  Pulmonary:     Effort: Pulmonary effort is normal.     Breath sounds: Normal breath sounds and air entry.  Abdominal:     General: Bowel sounds are normal.  Musculoskeletal:     Right lower leg: No edema.     Left lower leg: No edema.  Neurological:     Mental Status: She is alert and oriented to person, place, and time. Mental status is at baseline.     Gait: Gait is intact.  Psychiatric:  Mood and Affect: Mood and affect normal.        Speech: Speech normal.        Behavior: Behavior normal.        Judgment: Judgment normal.      No results found for any visits on 03/20/22.    The 10-year ASCVD risk score (Arnett DK, et al., 2019) is: 6.2%    Assessment & Plan:   Problem List Items Addressed This Visit       Cardiovascular and Mediastinum   Essential hypertension - Primary    Current hypertension medications:       Sig   hydrochlorothiazide (MICROZIDE) 12.5 MG capsule (Taking) Take 1 capsule (12.5 mg total) by mouth daily.   lisinopril (ZESTRIL) 30 MG tablet (Taking) Take 1 tablet (30 mg total) by mouth daily.  BP is well controlled on the above medications, will continue these as prescribed.      Other  Visit Diagnoses     Insomnia, unspecified type       Relevant Medications   Patient does not think she has OSA, she denies any gasping or heavy snoring, just having multipe nighttime awakenings. Will try trazodone 25- 50 mg at bedtime. Patient is to call me to let me know if the medication is helping.   traZODone (DESYREL) 50 MG tablet   Immunization due       Relevant Orders   Pneumococcal conjugate vaccine 20-valent (Prevnar 20) (Completed)   Breast cancer screening by mammogram       Relevant Orders   MM Digital Screening       Return in about 6 months (around 09/18/2022) for Follow up.    Farrel Conners, MD

## 2022-03-20 NOTE — Assessment & Plan Note (Signed)
Current hypertension medications:      Sig   hydrochlorothiazide (MICROZIDE) 12.5 MG capsule (Taking) Take 1 capsule (12.5 mg total) by mouth daily.   lisinopril (ZESTRIL) 30 MG tablet (Taking) Take 1 tablet (30 mg total) by mouth daily.     BP is well controlled on the above medications, will continue these as prescribed.

## 2022-03-27 ENCOUNTER — Other Ambulatory Visit: Payer: Self-pay | Admitting: Family Medicine

## 2022-04-03 ENCOUNTER — Encounter: Payer: Self-pay | Admitting: Family Medicine

## 2022-04-03 ENCOUNTER — Ambulatory Visit (INDEPENDENT_AMBULATORY_CARE_PROVIDER_SITE_OTHER): Payer: Medicare Other | Admitting: Family Medicine

## 2022-04-03 VITALS — BP 122/62 | HR 95 | Temp 98.8°F | Ht <= 58 in | Wt 157.7 lb

## 2022-04-03 DIAGNOSIS — M79605 Pain in left leg: Secondary | ICD-10-CM

## 2022-04-03 DIAGNOSIS — R051 Acute cough: Secondary | ICD-10-CM

## 2022-04-03 DIAGNOSIS — J069 Acute upper respiratory infection, unspecified: Secondary | ICD-10-CM | POA: Diagnosis not present

## 2022-04-03 MED ORDER — BENZONATATE 200 MG PO CAPS: 200.0000 mg | ORAL_CAPSULE | Freq: Two times a day (BID) | ORAL | 0 refills | Status: DC | PRN

## 2022-04-03 MED ORDER — DM-GUAIFENESIN ER 30-600 MG PO TB12: 1.0000 | ORAL_TABLET | Freq: Two times a day (BID) | ORAL | 0 refills | Status: AC

## 2022-04-03 NOTE — Progress Notes (Signed)
Established Patient Office Visit  Subjective   Patient ID: [name], female    DOB: Nov 23, 1955  Age: 66 y.o. MRN: [id number]  Chief Complaint  Patient presents with   Cough    Non-productive x4 days, tried cough drops with some relief   Leg Pain    Patient complains of bilateral leg pain and weakness when walking x1 week    Patient is here for new coughing and nasal drainge for the past 4 days. Cough is nonproductive. Has tried cough drops at home, states that she cannot sleep, no fever/chills, mild sore throat, no ear pain. No chest pain or difficulty breathing.   States the leg pain is on the left states that it is like a burning pain, no feelings of weakness of the leg, states that it is worse with weight bearing, it improves when she stops walking and sits down. Duration of 1 week.   Cough This is a new problem. The current episode started in the past 7 days.  Leg Pain  The incident occurred more than 1 week ago. The pain is present in the left leg. The quality of the pain is described as burning. The pain is mild. The pain has been Fluctuating since onset. The symptoms are aggravated by weight bearing.   Current Outpatient Medications  Medication Instructions   alendronate (FOSAMAX) 70 mg, Oral, Weekly   benzonatate (TESSALON) 200 mg, Oral, 2 times daily PRN   celecoxib (CELEBREX) 100 mg, Oral, 2 times daily PRN   dextromethorphan-guaiFENesin (MUCINEX DM) 30-600 MG 12hr tablet 1 tablet, Oral, 2 times daily   hydrochlorothiazide (MICROZIDE) 12.5 mg, Oral, Daily   lisinopril (ZESTRIL) 30 mg, Oral, Daily   rosuvastatin (CRESTOR) 20 mg, Oral, Daily   tolterodine (DETROL LA) 4 mg, Oral, Daily   traZODone (DESYREL) 25-50 mg, Oral, At bedtime PRN   VITAMIN D PO 2,000 Units, Oral, Daily    Patient Active Problem List   Diagnosis Date Noted   Essential hypertension 02/03/2009    Priority: 1.   Dyslipidemia 06/20/2012    Priority: 2.   Osteopenia/?Osteoporosis - followed by  her gynecologist, Dr. Ernestina Penna 06/20/2012    Priority: 4.   H/O Turner syndrome 05/02/2012   INFLAMMATORY BOWEL DISEASE - Followed by GI 02/03/2009   COLONIC POLYPS, HYPERPLASTIC, HX OF 02/03/2009      Review of Systems  Respiratory:  Positive for cough.       Objective:     BP 122/62 (BP Location: Left Arm, Patient Position: Sitting, Cuff Size: Normal)   Pulse 95   Temp 98.8 F (37.1 C) (Oral)   Ht 4\' 8"  (1.422 m)   Wt 157 lb 11.2 oz (71.5 kg)   SpO2 99%   BMI 35.36 kg/m    Physical Exam Vitals reviewed.  Constitutional:      Appearance: Normal appearance. She is obese.  HENT:     Right Ear: Tympanic membrane normal.     Left Ear: Tympanic membrane normal.     Mouth/Throat:     Mouth: Mucous membranes are moist.     Pharynx: Oropharynx is clear. No oropharyngeal exudate or posterior oropharyngeal erythema.  Eyes:     Conjunctiva/sclera: Conjunctivae normal.  Cardiovascular:     Rate and Rhythm: Normal rate and regular rhythm.     Pulses: Normal pulses.  Pulmonary:     Effort: Pulmonary effort is normal.     Breath sounds: No wheezing or rales.  Chest:     Chest wall:  No tenderness.  Musculoskeletal:     Right lower leg: Edema present.     Left lower leg: Edema present.  Neurological:     Mental Status: She is alert.      No results found for any visits on 04/03/22.    The 10-year ASCVD risk score (Arnett DK, et al., 2019) is: 6.2%    Assessment & Plan:   Problem List Items Addressed This Visit   None Visit Diagnoses     Acute upper respiratory infection    -  Primary   Relevant Medications   Most likely viral given the symptoms and duration, recommended mucinex DM and tessalon perles to help with her symptoms. Pt is to RTC if sx worsen or persist.  dextromethorphan-guaiFENesin (MUCINEX DM) 30-600 MG 12hr tablet   benzonatate (TESSALON) 200 MG capsule   Other Relevant Orders   POC COVID-19 BinaxNow   Acute leg pain, left       Relevant  Orders   Unclear etiology, there is swelling of both extremities, worse on the left, I recommend CK level and ruling out DVT with d dimer. If positive she will need to go for US venous duplex.  CK   D-dimer, Quantitative       No follow-ups on file.    Karie Georges, MD

## 2022-04-04 ENCOUNTER — Other Ambulatory Visit (INDEPENDENT_AMBULATORY_CARE_PROVIDER_SITE_OTHER): Payer: Medicare Other

## 2022-04-04 DIAGNOSIS — M79605 Pain in left leg: Secondary | ICD-10-CM

## 2022-04-04 DIAGNOSIS — R7989 Other specified abnormal findings of blood chemistry: Secondary | ICD-10-CM

## 2022-04-05 ENCOUNTER — Telehealth: Payer: Self-pay | Admitting: Family Medicine

## 2022-04-05 LAB — CK: Total CK: 123 U/L (ref 7–177)

## 2022-04-05 LAB — D-DIMER, QUANTITATIVE: D-Dimer, Quant: 2.11 mcg/mL FEU — ABNORMAL HIGH (ref <0.50)

## 2022-04-05 NOTE — Telephone Encounter (Signed)
Spoke with Theadora Rama at [facility] and informed her per PCP's note, yes the test was ordered to rule out DVT.  She stated she will contact the patient for scheduling.

## 2022-04-05 NOTE — Telephone Encounter (Signed)
[name] with [name] is calling and would like to know if US Venous is to rule out DVT

## 2022-04-05 NOTE — Telephone Encounter (Signed)
Brandi from [facility] called and stated for the ultrasound stat order that was sched for pt; pt called and declined that appt and resched it to Nov. 27th.   Brandi wanted Dr to know since this was a urgent order.   Please advise.

## 2022-04-05 NOTE — Telephone Encounter (Signed)
[facility]  [phone] Fax:  [phone]  Asking if ultrasound venous doppler to rule out DVT?

## 2022-04-06 ENCOUNTER — Telehealth: Payer: Self-pay | Admitting: Family Medicine

## 2022-04-06 NOTE — Telephone Encounter (Signed)
Thinks she may have a bloodclot and scheduled for ultrasound on 04/16/22 wants to speak with someone on whether this is too long, can she exercise in the meantime, etc.

## 2022-04-06 NOTE — Telephone Encounter (Signed)
Spoke with the patient and informed her of the message below.  Patient stated she was told the appt on 11/27 was the earliest available and she will call [facility] back for a sooner appt.

## 2022-04-06 NOTE — Telephone Encounter (Signed)
It's ok but please tell patient that if she develops any shortness of breath or chest pain, palpitations or dizziness she will need to immediately go to the ER for evaluation.

## 2022-04-06 NOTE — Telephone Encounter (Signed)
Spoke with the patient and informed her of the message below.  Patient agreed to contact [facility] for cancellations.

## 2022-04-06 NOTE — Telephone Encounter (Signed)
I did put the order in as urgent, I would like the [place] to be done sooner rather than later. I would not advise rigorous exercise until the [place] is done. Please ask her if she would be able to get the [place] sooner.

## 2022-04-10 ENCOUNTER — Other Ambulatory Visit: Payer: Medicare Other

## 2022-04-16 ENCOUNTER — Other Ambulatory Visit: Payer: Medicare Other

## 2022-04-17 ENCOUNTER — Ambulatory Visit (INDEPENDENT_AMBULATORY_CARE_PROVIDER_SITE_OTHER): Payer: Medicare Other | Admitting: Family Medicine

## 2022-04-17 ENCOUNTER — Encounter: Payer: Self-pay | Admitting: Family Medicine

## 2022-04-17 ENCOUNTER — Ambulatory Visit
Admission: RE | Admit: 2022-04-17 | Discharge: 2022-04-17 | Disposition: A | Discharge status: Home / Self Care | Payer: Medicare Other | Source: Ambulatory Visit | Attending: Family Medicine | Admitting: Family Medicine

## 2022-04-17 VITALS — BP 138/70 | HR 105 | Temp 98.2°F | Ht <= 58 in | Wt 159.1 lb

## 2022-04-17 DIAGNOSIS — M79605 Pain in left leg: Secondary | ICD-10-CM

## 2022-04-17 DIAGNOSIS — J209 Acute bronchitis, unspecified: Secondary | ICD-10-CM | POA: Diagnosis not present

## 2022-04-17 DIAGNOSIS — R7989 Other specified abnormal findings of blood chemistry: Secondary | ICD-10-CM

## 2022-04-17 DIAGNOSIS — M7989 Other specified soft tissue disorders: Secondary | ICD-10-CM | POA: Diagnosis not present

## 2022-04-17 MED ORDER — DOXYCYCLINE HYCLATE 100 MG PO TABS: 100.0000 mg | ORAL_TABLET | Freq: Two times a day (BID) | ORAL | 0 refills | Status: AC

## 2022-04-17 MED ORDER — ALBUTEROL SULFATE HFA 108 (90 BASE) MCG/ACT IN AERS: 2.0000 | INHALATION_SPRAY | Freq: Four times a day (QID) | RESPIRATORY_TRACT | 0 refills | Status: DC | PRN

## 2022-04-17 MED ORDER — PREDNISONE 20 MG PO TABS: ORAL_TABLET | ORAL | 0 refills | Status: AC

## 2022-04-17 NOTE — Progress Notes (Unsigned)
Established Patient Office Visit  Subjective   Patient ID: [name], female    DOB: Dec 11, 1955  Age: 66 y.o. MRN: [id number]  Chief Complaint  Patient presents with   Cough    Patient complains of recurrent non-productive cough    Cough The current episode started 1 to 4 weeks ago. The problem has been unchanged. The problem occurs every few minutes. The cough is Non-productive. Associated symptoms include nasal congestion, postnasal drip, a sore throat, shortness of breath and wheezing. Pertinent negatives include no chest pain, chills, fever or sweats. She has tried OTC cough suppressant and prescription cough suppressant for the symptoms. The treatment provided mild relief.   Current Outpatient Medications  Medication Instructions   albuterol (VENTOLIN HFA) 108 (90 Base) MCG/ACT inhaler 2 puffs, Inhalation, Every 6 hours PRN   alendronate (FOSAMAX) 70 mg, Oral, Weekly   celecoxib (CELEBREX) 100 mg, Oral, 2 times daily PRN   doxycycline (VIBRA-TABS) 100 mg, Oral, 2 times daily   hydrochlorothiazide (MICROZIDE) 12.5 mg, Oral, Daily   lisinopril (ZESTRIL) 30 mg, Oral, Daily   predniSONE (DELTASONE) 20 MG tablet Take 3 tablets (60 mg total) by mouth daily with breakfast for 3 days, THEN 2 tablets (40 mg total) daily with breakfast for 3 days, THEN 1 tablet (20 mg total) daily with breakfast for 3 days, THEN 0.5 tablets (10 mg total) daily with breakfast for 3 days.   rosuvastatin (CRESTOR) 20 mg, Oral, Daily   tolterodine (DETROL LA) 4 mg, Oral, Daily   traZODone (DESYREL) 25-50 mg, Oral, At bedtime PRN   VITAMIN D PO 2,000 Units, Oral, Daily    Patient Active Problem List   Diagnosis Date Noted   Essential hypertension 02/03/2009    Priority: 1.   Dyslipidemia 06/20/2012    Priority: 2.   Osteopenia/?Osteoporosis - followed by her gynecologist, Dr. Pamala Hurry 06/20/2012    Priority: 4.   H/O Turner syndrome 05/02/2012   INFLAMMATORY BOWEL DISEASE - Followed by GI 02/03/2009    COLONIC POLYPS, HYPERPLASTIC, HX OF 02/03/2009      Review of Systems  Constitutional:  Negative for chills and fever.  HENT:  Positive for postnasal drip and sore throat.   Respiratory:  Positive for cough, shortness of breath and wheezing.   Cardiovascular:  Negative for chest pain.  All other systems reviewed and are negative.     Objective:     BP (!) 144/68 (BP Location: Left Arm, Patient Position: Sitting, Cuff Size: Normal)   Pulse (!) 105   Temp 98.2 F (36.8 C) (Oral)   Ht '4\' 8"'$  (1.422 m)   Wt 159 lb 1.6 oz (72.2 kg)   SpO2 97%   BMI 35.67 kg/m  BP Readings from Last 3 Encounters:  04/17/22 (!) 144/68  04/03/22 122/62  03/20/22 122/64      Physical Exam Vitals reviewed.  Constitutional:      Appearance: Normal appearance. She is well-groomed and normal weight.  HENT:     Right Ear: Tympanic membrane normal.     Left Ear: Tympanic membrane normal.     Nose: Congestion present.     Mouth/Throat:     Mouth: Mucous membranes are moist.     Pharynx: Oropharynx is clear. No oropharyngeal exudate or posterior oropharyngeal erythema.  Eyes:     Conjunctiva/sclera: Conjunctivae normal.  Cardiovascular:     Rate and Rhythm: Normal rate and regular rhythm.     Pulses: Normal pulses.     Heart sounds: S1 normal  and S2 normal.  Pulmonary:     Effort: Pulmonary effort is normal.     Breath sounds: Normal air entry. Wheezing (inspiratory and expiratory wheezing in the lower airways) present.  Neurological:     Mental Status: She is alert and oriented to person, place, and time. Mental status is at baseline.     Gait: Gait is intact.  Psychiatric:        Mood and Affect: Mood and affect normal.        Speech: Speech normal.        Behavior: Behavior normal.        Judgment: Judgment normal.      No results found for any visits on 04/17/22.    The 10-year ASCVD risk score (Arnett DK, et al., 2019) is: 8.6%    Assessment & Plan:   Problem List Items  Addressed This Visit   None Visit Diagnoses     Acute bronchitis, unspecified organism    -  Primary   Relevant Medications   Persistent nagging cough with SOB, was treated conservatively previously with cough suppressants however the cough has lingered and she now has wheezing on exam which was not present previously. Will treat with prednisone 20 mg tablets in a tapering dose. Also will treat with doxycycline 100 mg BID for 7 days in case of bacterial infection, also gave patient albuterol inhaler 2 puffs every 4-6 hours as needed for cough. Pt is to follow up if her symptoms worsen or persist.  predniSONE (DELTASONE) 20 MG tablet   doxycycline (VIBRA-TABS) 100 MG tablet   albuterol (VENTOLIN HFA) 108 (90 Base) MCG/ACT inhaler       No follow-ups on file.    Farrel Conners, MD

## 2022-04-17 NOTE — Progress Notes (Signed)
Negative for DVT in the lower legs

## 2022-05-08 ENCOUNTER — Ambulatory Visit (INDEPENDENT_AMBULATORY_CARE_PROVIDER_SITE_OTHER): Payer: Medicare Other | Admitting: Family Medicine

## 2022-05-08 ENCOUNTER — Ambulatory Visit (INDEPENDENT_AMBULATORY_CARE_PROVIDER_SITE_OTHER): Payer: Medicare Other

## 2022-05-08 ENCOUNTER — Encounter: Payer: Self-pay | Admitting: Family Medicine

## 2022-05-08 VITALS — BP 160/76 | HR 95 | Temp 98.3°F | Ht <= 58 in | Wt 161.2 lb

## 2022-05-08 DIAGNOSIS — R059 Cough, unspecified: Secondary | ICD-10-CM

## 2022-05-08 DIAGNOSIS — R0982 Postnasal drip: Secondary | ICD-10-CM

## 2022-05-08 MED ORDER — FLUTICASONE PROPIONATE 50 MCG/ACT NA SUSP: 2.0000 | Freq: Every day | NASAL | 6 refills | Status: DC

## 2022-05-08 MED ORDER — ALBUTEROL SULFATE HFA 108 (90 BASE) MCG/ACT IN AERS: 2.0000 | INHALATION_SPRAY | Freq: Four times a day (QID) | RESPIRATORY_TRACT | 2 refills | Status: DC | PRN
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
## 2022-05-08 NOTE — Patient Instructions (Signed)
Use claritin or zyrtec  10 mg once daily for the nasal drainage plus the flonase nasal spray once daily.

## 2022-05-28 ENCOUNTER — Ambulatory Visit
Admission: RE | Admit: 2022-05-28 | Discharge: 2022-05-28 | Disposition: A | Discharge status: Home / Self Care | Payer: BLUE CROSS/BLUE SHIELD | Source: Ambulatory Visit | Attending: Family Medicine | Admitting: Family Medicine

## 2022-05-28 ENCOUNTER — Ambulatory Visit: Payer: Medicare Other

## 2022-05-28 DIAGNOSIS — Z1231 Encounter for screening mammogram for malignant neoplasm of breast: Secondary | ICD-10-CM

## 2022-05-29 NOTE — Progress Notes (Signed)
Normal mammo, repeat yearly

## 2022-06-11 ENCOUNTER — Other Ambulatory Visit: Payer: Self-pay | Admitting: Family Medicine

## 2022-06-11 DIAGNOSIS — G4700 Insomnia, unspecified: Secondary | ICD-10-CM

## 2022-06-18 ENCOUNTER — Ambulatory Visit (INDEPENDENT_AMBULATORY_CARE_PROVIDER_SITE_OTHER): Payer: Medicare Other | Admitting: Family Medicine

## 2022-06-18 ENCOUNTER — Telehealth: Payer: Self-pay | Admitting: *Deleted

## 2022-06-18 ENCOUNTER — Encounter: Payer: Self-pay | Admitting: Family Medicine

## 2022-06-18 VITALS — BP 163/90 | HR 143 | Temp 98.7°F | Ht <= 58 in | Wt 154.6 lb

## 2022-06-18 DIAGNOSIS — H66002 Acute suppurative otitis media without spontaneous rupture of ear drum, left ear: Secondary | ICD-10-CM | POA: Diagnosis not present

## 2022-06-18 DIAGNOSIS — I10 Essential (primary) hypertension: Secondary | ICD-10-CM | POA: Diagnosis not present

## 2022-06-18 LAB — BASIC METABOLIC PANEL
BUN: 19 mg/dL (ref 6–23)
CO2: 27 mEq/L (ref 19–32)
Calcium: 10.2 mg/dL (ref 8.4–10.5)
Chloride: 99 mEq/L (ref 96–112)
Creatinine, Ser: 0.82 mg/dL (ref 0.40–1.20)
GFR: 74.79 mL/min (ref >60.00)
Glucose, Bld: 100 mg/dL — ABNORMAL HIGH (ref 70–99)
Potassium: 4.3 mEq/L (ref 3.5–5.1)
Sodium: 138 mEq/L (ref 135–145)

## 2022-06-18 LAB — TSH: TSH: 2.17 u[IU]/mL (ref 0.35–5.50)

## 2022-06-18 MED ORDER — AMOXICILLIN 500 MG PO CAPS: 500.0000 mg | ORAL_CAPSULE | Freq: Three times a day (TID) | ORAL | 0 refills | Status: AC

## 2022-06-18 MED ORDER — LISINOPRIL 40 MG PO TABS: 40.0000 mg | ORAL_TABLET | Freq: Every day | ORAL | 3 refills | Status: DC

## 2022-06-18 MED ORDER — HYDROCHLOROTHIAZIDE 25 MG PO TABS: 25.0000 mg | ORAL_TABLET | Freq: Every day | ORAL | 3 refills | Status: DC

## 2022-06-18 NOTE — Telephone Encounter (Signed)
Message sent to PCP as I spoke with the patient and scheduled an appt today at 1:30pm.

## 2022-06-18 NOTE — Telephone Encounter (Signed)
-----  Message from Venetia Constable sent at 06/18/2022  8:24 AM EST ----- Please abstract and route to provider.

## 2022-06-18 NOTE — Progress Notes (Unsigned)
Established Patient Office Visit  Subjective   Patient ID: [name], female    DOB: [birth year]  Age: 67 y.o. MRN: [id number]  Chief Complaint  Patient presents with   Hypertension    Patient complains of elevated BP readings at home ranging around 160/90 for the past 3 days    Patient is here due to elevated BP at home. She states that her home cuff is giving her different readings, that she felt her BP was elevated a few days ago while sitting with her friends and she started checking it daily. She denies any chest pain, dizziness, or changes in SOB or urination.   She also reports she cannot hear out of her left ear for the past 2 days. She reports she wears hearing aids but reports that she used to be able to hear better in that left ear. She denies any ear pain at this time, no fullness or pressure.    Current Outpatient Medications  Medication Instructions   albuterol (VENTOLIN HFA) 108 (90 Base) MCG/ACT inhaler 2 puffs, Inhalation, Every 6 hours PRN   alendronate (FOSAMAX) 70 mg, Oral, Weekly   amoxicillin (AMOXIL) 500 mg, Oral, 3 times daily   celecoxib (CELEBREX) 100 mg, Oral, 2 times daily PRN   fluticasone (FLONASE) 50 MCG/ACT nasal spray 2 sprays, Each Nare, Daily   hydrochlorothiazide (HYDRODIURIL) 25 mg, Oral, Daily   lisinopril (ZESTRIL) 40 mg, Oral, Daily   rosuvastatin (CRESTOR) 20 mg, Oral, Daily   tolterodine (DETROL LA) 4 mg, Oral, Daily   traZODone (DESYREL) 50 MG tablet TAKE 0.5-1 TABLETS BY MOUTH AT BEDTIME AS NEEDED FOR SLEEP.   VITAMIN D PO 2,000 Units, Oral, Daily    Patient Active Problem List   Diagnosis Date Noted   Essential hypertension 02/03/2009    Priority: 1.   Dyslipidemia 06/20/2012    Priority: 2.   Osteopenia/?Osteoporosis - followed by her gynecologist, Dr. Pamala Hurry 06/20/2012    Priority: 4.   H/O Turner syndrome 05/02/2012   INFLAMMATORY BOWEL DISEASE - Followed by GI 02/03/2009   COLONIC POLYPS, HYPERPLASTIC, HX OF 02/03/2009       Review of Systems  Constitutional:  Negative for chills and fever.  HENT:  Negative for sore throat.   Respiratory:  Negative for shortness of breath.   Cardiovascular:  Negative for chest pain and leg swelling.      Objective:     BP (!) 163/90 (BP Location: Right Arm, Patient Position: Sitting, Cuff Size: Normal) Comment: per patient's home monitor--jaf  Pulse (!) 143   Temp 98.7 F (37.1 C) (Oral)   Ht '4\' 8"'$  (1.422 m)   Wt 154 lb 9.6 oz (70.1 kg)   SpO2 98%   BMI 34.66 kg/m  BP Readings from Last 3 Encounters:  06/18/22 (!) 163/90  05/08/22 (!) 160/76  04/17/22 138/70      Physical Exam Vitals reviewed.  Constitutional:      Appearance: Normal appearance. She is obese.  HENT:     Right Ear: Tympanic membrane and ear canal normal.     Left Ear: Tympanic membrane is injected and bulging.     Nose: No rhinorrhea.     Mouth/Throat:     Mouth: Mucous membranes are moist.     Pharynx: No posterior oropharyngeal erythema.  Cardiovascular:     Rate and Rhythm: Normal rate and regular rhythm.     Pulses: Normal pulses.  Pulmonary:     Effort: Pulmonary effort is normal.  Breath sounds: Normal breath sounds.  Neurological:     Mental Status: She is alert and oriented to person, place, and time. Mental status is at baseline.  Psychiatric:        Mood and Affect: Mood normal.       The 10-year ASCVD risk score (Arnett DK, et al., 2019) is: 10.9%    Assessment & Plan:   Problem List Items Addressed This Visit       1.   Essential hypertension - Primary (Chronic)    Uncontrolled, will increase lisinopril to 40 mg daily and HCTZ to 25 mg daily. I will see her back in 1 month for a BP recheck. Will also check kidney function and TSH to make sure nothing has changed.       Relevant Medications   lisinopril (ZESTRIL) 40 MG tablet   hydrochlorothiazide (HYDRODIURIL) 25 MG tablet   Other Relevant Orders   TSH (Completed)   Basic Metabolic Panel  (Completed)   Other Visit Diagnoses     Non-recurrent acute suppurative otitis media of left ear without spontaneous rupture of tympanic membrane       Relevant Medications   amoxicillin (AMOXIL) 500 MG capsule     Patient has erythema/injection of the left ear drum indicating infection. Will call in amoxicillin 500 mg TID for 10 days.  Return in about 1 month (around 07/19/2022) for HTN BP recheck.    Farrel Conners, MD

## 2022-06-19 NOTE — Assessment & Plan Note (Addendum)
Uncontrolled, will increase lisinopril to 40 mg daily and HCTZ to 25 mg daily. I will see her back in 1 month for a BP recheck. Will also check kidney function and TSH to make sure nothing has changed.

## 2022-06-19 NOTE — Progress Notes (Signed)
Kidney function and TSH are normal

## 2022-06-25 ENCOUNTER — Encounter: Payer: Self-pay | Admitting: Family Medicine

## 2022-06-25 ENCOUNTER — Telehealth (INDEPENDENT_AMBULATORY_CARE_PROVIDER_SITE_OTHER): Payer: Medicare Other | Admitting: Family Medicine

## 2022-06-25 ENCOUNTER — Other Ambulatory Visit: Payer: Self-pay | Admitting: Family Medicine

## 2022-06-25 VITALS — BP 139/79 | HR 90 | Temp 98.4°F

## 2022-06-25 DIAGNOSIS — U071 COVID-19: Secondary | ICD-10-CM | POA: Diagnosis not present

## 2022-06-25 DIAGNOSIS — I10 Essential (primary) hypertension: Secondary | ICD-10-CM

## 2022-06-25 MED ORDER — BENZONATATE 100 MG PO CAPS: 100.0000 mg | ORAL_CAPSULE | Freq: Two times a day (BID) | ORAL | 0 refills | Status: DC | PRN

## 2022-06-25 MED ORDER — DM-GUAIFENESIN ER 30-600 MG PO TB12: 1.0000 | ORAL_TABLET | Freq: Two times a day (BID) | ORAL | 0 refills | Status: DC

## 2022-06-25 NOTE — Progress Notes (Unsigned)
   Established Patient Office Visit  Subjective   Patient ID: [name], female    DOB: Feb 09, 1956  Age: 67 y.o. MRN: [id number]  Chief Complaint  Patient presents with   Covid Positive    Patient states the home Covid test was positive 4 days ago   Fever    Temperature of 101.1 degrees x4 days, tried Advil    Cough    Non-productive x4 days, denies OTC medication   Hearing Problem    Patient complains of hearing loss of the left ear x1 week, same as last visit when she was diagnosed with an ear infection  I connected with  Garret Reddish on 06/25/22 by a video enabled telemedicine application and verified that I am speaking with the correct person using two identifiers.   I discussed the limitations of evaluation and management by telemedicine. The patient expressed understanding and agreed to proceed.   Patient location: home address  Provider location: [facility] [employer] office.   Pt is reporting positive COVID test 4 days ago, first day of symptoms was almost 6 day states that she started feeling very tired, fevers which have now resolved. States that over the last 4 days her symptoms    {History (Optional):23778}  Review of Systems  All other systems reviewed and are negative.     Objective:     BP 139/79   Pulse 90   Temp 98.4 F (36.9 C)  {Vitals History (Optional):23777}  Physical Exam   No results found for any visits on 06/25/22.  {Labs (Optional):23779}  The 10-year ASCVD risk score (Arnett DK, et al., 2019) is: 8%    Assessment & Plan:   Problem List Items Addressed This Visit   None Visit Diagnoses     COVID-19    -  Primary   Relevant Medications   dextromethorphan-guaiFENesin (MUCINEX DM) 30-600 MG 12hr tablet   benzonatate (TESSALON) 100 MG capsule       No follow-ups on file.    Farrel Conners, MD

## 2022-07-19 ENCOUNTER — Ambulatory Visit (INDEPENDENT_AMBULATORY_CARE_PROVIDER_SITE_OTHER): Payer: Medicare Other | Admitting: Family Medicine

## 2022-07-19 ENCOUNTER — Encounter: Payer: Self-pay | Admitting: Family Medicine

## 2022-07-19 VITALS — BP 120/60 | HR 117 | Temp 98.3°F | Ht <= 58 in | Wt 152.4 lb

## 2022-07-19 DIAGNOSIS — I10 Essential (primary) hypertension: Secondary | ICD-10-CM

## 2022-07-19 DIAGNOSIS — M79605 Pain in left leg: Secondary | ICD-10-CM

## 2022-07-19 NOTE — Patient Instructions (Signed)
STOP crestor for 2 weeks. Then wait to see if the left leg pain goes away. If the leg pain does go away then it was the crestor causing it. If the pain does not go away, then I would schedule an appointment with your orthopedist to check the hardware in your hip and thigh.

## 2022-07-19 NOTE — Progress Notes (Signed)
Established Patient Office Visit  Subjective   Patient ID: [name], female    DOB: 17-Jul-1955  Age: 67 y.o. MRN: [id number]  Chief Complaint  Patient presents with   Medical Management of Chronic Issues    Pt is here for BP check today. States she went up on her HCTZ 25 mg and lisinopril to 40 mg daily. States she is tolerating this well, no dizziness or headaches. BP at home has been good also. BP today in office is now well controlled.   Acute leg pain-- she continues to have problems with leg pain on the left. States that is only hurts when she is up walking on it, no pain with sitting or laying. States that she feels like the leg gets "tired" quickly, has to lean on the grocery cart to walk, is visibly limping. She reports she had a hip fracture repair on the left about 10 years ago. Has an orthopedist that she sees regularly.    Current Outpatient Medications  Medication Instructions   alendronate (FOSAMAX) 70 mg, Oral, Weekly   celecoxib (CELEBREX) 100 mg, Oral, 2 times daily PRN   fluticasone (FLONASE) 50 MCG/ACT nasal spray 2 sprays, Each Nare, Daily   hydrochlorothiazide (HYDRODIURIL) 25 mg, Oral, Daily   lisinopril (ZESTRIL) 40 mg, Oral, Daily   rosuvastatin (CRESTOR) 20 MG tablet TAKE 1 TABLET BY MOUTH DAILY. GENERIC EQUIVALENT FOR CRESTOR. NEEDS AN APPOINTMENT   tolterodine (DETROL LA) 4 mg, Oral, Daily   traZODone (DESYREL) 50 MG tablet TAKE 0.5-1 TABLETS BY MOUTH AT BEDTIME AS NEEDED FOR SLEEP.   VITAMIN D PO 2,000 Units, Oral, Daily    Patient Active Problem List   Diagnosis Date Noted   Essential hypertension 02/03/2009    Priority: 1.   Dyslipidemia 06/20/2012    Priority: 2.   Osteopenia/?Osteoporosis - followed by her gynecologist, Dr. Pamala Hurry 06/20/2012    Priority: 4.   Acute leg pain, left 07/19/2022   H/O Turner syndrome 05/02/2012   INFLAMMATORY BOWEL DISEASE - Followed by GI 02/03/2009   COLONIC POLYPS, HYPERPLASTIC, HX OF 02/03/2009       Review of Systems  All other systems reviewed and are negative.     Objective:     BP 120/60 (BP Location: Left Arm, Patient Position: Sitting, Cuff Size: Normal)   Pulse (!) 117   Temp 98.3 F (36.8 C) (Oral)   Ht '4\' 8"'$  (1.422 m)   Wt 152 lb 6.4 oz (69.1 kg)   SpO2 95%   BMI 34.17 kg/m    Physical Exam Vitals reviewed.  Constitutional:      General: She is not in acute distress.    Appearance: Normal appearance. She is well-groomed. She is obese.  Eyes:     Conjunctiva/sclera: Conjunctivae normal.  Neck:     Thyroid: No thyromegaly.  Cardiovascular:     Rate and Rhythm: Normal rate and regular rhythm.     Pulses: Normal pulses.     Heart sounds: S1 normal and S2 normal.  Pulmonary:     Effort: Pulmonary effort is normal.     Breath sounds: Normal breath sounds and air entry.  Abdominal:     General: Bowel sounds are normal.  Musculoskeletal:     Right lower leg: No edema.     Left lower leg: No edema.  Neurological:     Mental Status: She is alert and oriented to person, place, and time. Mental status is at baseline.     Gait: Gait  is intact.  Psychiatric:        Mood and Affect: Mood and affect normal.        Speech: Speech normal.        Behavior: Behavior normal.        Judgment: Judgment normal.      No results found for any visits on 07/19/22.    The 10-year ASCVD risk score (Arnett DK, et al., 2019) is: 6.7%    Assessment & Plan:   Problem List Items Addressed This Visit       1.   Essential hypertension - Primary (Chronic)    Current hypertension medications:       Sig   hydrochlorothiazide (HYDRODIURIL) 25 MG tablet (Taking) Take 1 tablet (25 mg total) by mouth daily.   lisinopril (ZESTRIL) 40 MG tablet (Taking) Take 1 tablet (40 mg total) by mouth daily.     BP is now well controlled with the above medications, she is tolerating them well. Continue meds as prescribed.         Unprioritized   Acute leg pain, left    Continued  pain despite ruling out any vascular causes. It is not likely nerve pain because it only hurts with weight bearing. I recommended she try stopping her crestor for 2 weeks to see if the pain resolves. If it does not then she is to resume the crestor and call her othropedist for further recommendations.        Return in about 4 months (around 11/17/2022) for reschedule pt for June-- will need to be fasting for bloodwork -- cancel May 20 appt.    Farrel Conners, MD

## 2022-07-19 NOTE — Assessment & Plan Note (Signed)
Continued pain despite ruling out any vascular causes. It is not likely nerve pain because it only hurts with weight bearing. I recommended she try stopping her crestor for 2 weeks to see if the pain resolves. If it does not then she is to resume the crestor and call her othropedist for further recommendations.

## 2022-07-19 NOTE — Assessment & Plan Note (Signed)
Current hypertension medications:       Sig   hydrochlorothiazide (HYDRODIURIL) 25 MG tablet (Taking) Take 1 tablet (25 mg total) by mouth daily.   lisinopril (ZESTRIL) 40 MG tablet (Taking) Take 1 tablet (40 mg total) by mouth daily.      BP is now well controlled with the above medications, she is tolerating them well. Continue meds as prescribed.

## 2022-07-24 ENCOUNTER — Telehealth: Payer: Self-pay | Admitting: Family Medicine

## 2022-07-24 NOTE — Telephone Encounter (Signed)
Contacted [facility] to schedule their annual wellness visit. Appointment made for 08/01/22.  [facility] [employer] [facility] direct phone # [phone]

## 2022-08-01 ENCOUNTER — Ambulatory Visit (INDEPENDENT_AMBULATORY_CARE_PROVIDER_SITE_OTHER): Payer: Medicare Other

## 2022-08-01 VITALS — Ht <= 58 in | Wt 152.0 lb

## 2022-08-01 DIAGNOSIS — Z0000 Encounter for general adult medical examination without abnormal findings: Secondary | ICD-10-CM

## 2022-08-01 NOTE — Patient Instructions (Addendum)
[name] , Thank you for taking time to come for your Medicare Wellness Visit. I appreciate your ongoing commitment to your health goals. Please review the following plan we discussed and let me know if I can assist you in the future.   These are the goals we discussed:  Goals       CCM Expected Outcome:  Monitor, Self-Manage, and Reduce Symptoms of Hypertension      Lose weight (pt-stated)      Continue to lose weight.        This is a list of the screening recommended for you and due dates:  Health Maintenance  Topic Date Due   Flu Shot  08/20/2022*   COVID-19 Vaccine (4 - 2023-24 season) 08/20/2022*   Zoster (Shingles) Vaccine (1 of 2) 09/18/2022*   Medicare Annual Wellness Visit  08/01/2023   DTaP/Tdap/Td vaccine (3 - Td or Tdap) 08/18/2023   Mammogram  05/28/2024   Colon Cancer Screening  05/19/2028   Pneumonia Vaccine  Completed   DEXA scan (bone density measurement)  Completed   Hepatitis C Screening: USPSTF Recommendation to screen - Ages 18-79 yo.  Completed   HPV Vaccine  Aged Out  *Topic was postponed. The date shown is not the original due date.    Advanced directives: Please bring a copy of your health care power of attorney and living will to the office to be added to your chart at your convenience.   Conditions/risks identified: None  Next appointment: Follow up in one year for your annual wellness visit    Preventive Care 65 Years and Older, Female Preventive care refers to lifestyle choices and visits with your health care provider that can promote health and wellness. What does preventive care include? A yearly physical exam. This is also called an annual well check. Dental exams once or twice a year. Routine eye exams. Ask your health care provider how often you should have your eyes checked. Personal lifestyle choices, including: Daily care of your teeth and gums. Regular physical activity. Eating a healthy diet. Avoiding tobacco and drug  use. Limiting alcohol use. Practicing safe sex. Taking low-dose aspirin every day. Taking vitamin and mineral supplements as recommended by your health care provider. What happens during an annual well check? The services and screenings done by your health care provider during your annual well check will depend on your age, overall health, lifestyle risk factors, and family history of disease. Counseling  Your health care provider may ask you questions about your: Alcohol use. Tobacco use. Drug use. Emotional well-being. Home and relationship well-being. Sexual activity. Eating habits. History of falls. Memory and ability to understand (cognition). Work and work Statistician. Reproductive health. Screening  You may have the following tests or measurements: Height, weight, and BMI. Blood pressure. Lipid and cholesterol levels. These may be checked every 5 years, or more frequently if you are over 68 years old. Skin check. Lung cancer screening. You may have this screening every year starting at age 71 if you have a 30-pack-year history of smoking and currently smoke or have quit within the past 15 years. Fecal occult blood test (FOBT) of the stool. You may have this test every year starting at age 97. Flexible sigmoidoscopy or colonoscopy. You may have a sigmoidoscopy every 5 years or a colonoscopy every 10 years starting at age 13. Hepatitis C blood test. Hepatitis B blood test. Sexually transmitted disease (STD) testing. Diabetes screening. This is done by checking your blood sugar (glucose)  after you have not eaten for a while (fasting). You may have this done every 1-3 years. Bone density scan. This is done to screen for osteoporosis. You may have this done starting at age 48. Mammogram. This may be done every 1-2 years. Talk to your health care provider about how often you should have regular mammograms. Talk with your health care provider about your test results, treatment  options, and if necessary, the need for more tests. Vaccines  Your health care provider may recommend certain vaccines, such as: Influenza vaccine. This is recommended every year. Tetanus, diphtheria, and acellular pertussis (Tdap, Td) vaccine. You may need a Td booster every 10 years. Zoster vaccine. You may need this after age 18. Pneumococcal 13-valent conjugate (PCV13) vaccine. One dose is recommended after age 37. Pneumococcal polysaccharide (PPSV23) vaccine. One dose is recommended after age 42. Talk to your health care provider about which screenings and vaccines you need and how often you need them. This information is not intended to replace advice given to you by your health care provider. Make sure you discuss any questions you have with your health care provider. Document Released: 06/03/2015 Document Revised: 01/25/2016 Document Reviewed: 03/08/2015 Elsevier Interactive Patient Education  2017 [place] Prevention in the Home Falls can cause injuries. They can happen to people of all ages. There are many things you can do to make your home safe and to help prevent falls. What can I do on the outside of my home? Regularly fix the edges of walkways and driveways and fix any cracks. Remove anything that might make you trip as you walk through a door, such as a raised step or threshold. Trim any bushes or trees on the path to your home. Use bright outdoor lighting. Clear any walking paths of anything that might make someone trip, such as rocks or tools. Regularly check to see if handrails are loose or broken. Make sure that both sides of any steps have handrails. Any raised decks and porches should have guardrails on the edges. Have any leaves, snow, or ice cleared regularly. Use sand or salt on walking paths during winter. Clean up any spills in your garage right away. This includes oil or grease spills. What can I do in the bathroom? Use night lights. Install grab  bars by the toilet and in the tub and shower. Do not use towel bars as grab bars. Use non-skid mats or decals in the tub or shower. If you need to sit down in the shower, use a plastic, non-slip stool. Keep the floor dry. Clean up any water that spills on the floor as soon as it happens. Remove soap buildup in the tub or shower regularly. Attach bath mats securely with double-sided non-slip rug tape. Do not have throw rugs and other things on the floor that can make you trip. What can I do in the bedroom? Use night lights. Make sure that you have a light by your bed that is easy to reach. Do not use any sheets or blankets that are too big for your bed. They should not hang down onto the floor. Have a firm chair that has side arms. You can use this for support while you get dressed. Do not have throw rugs and other things on the floor that can make you trip. What can I do in the kitchen? Clean up any spills right away. Avoid walking on wet floors. Keep items that you use a lot in easy-to-reach places. If  you need to reach something above you, use a strong step stool that has a grab bar. Keep electrical cords out of the way. Do not use floor polish or wax that makes floors slippery. If you must use wax, use non-skid floor wax. Do not have throw rugs and other things on the floor that can make you trip. What can I do with my stairs? Do not leave any items on the stairs. Make sure that there are handrails on both sides of the stairs and use them. Fix handrails that are broken or loose. Make sure that handrails are as long as the stairways. Check any carpeting to make sure that it is firmly attached to the stairs. Fix any carpet that is loose or worn. Avoid having throw rugs at the top or bottom of the stairs. If you do have throw rugs, attach them to the floor with carpet tape. Make sure that you have a light switch at the top of the stairs and the bottom of the stairs. If you do not have them,  ask someone to add them for you. What else can I do to help prevent falls? Wear shoes that: Do not have high heels. Have rubber bottoms. Are comfortable and fit you well. Are closed at the toe. Do not wear sandals. If you use a stepladder: Make sure that it is fully opened. Do not climb a closed stepladder. Make sure that both sides of the stepladder are locked into place. Ask someone to hold it for you, if possible. Clearly mark and make sure that you can see: Any grab bars or handrails. First and last steps. Where the edge of each step is. Use tools that help you move around (mobility aids) if they are needed. These include: Canes. Walkers. Scooters. Crutches. Turn on the lights when you go into a dark area. Replace any light bulbs as soon as they burn out. Set up your furniture so you have a clear path. Avoid moving your furniture around. If any of your floors are uneven, fix them. If there are any pets around you, be aware of where they are. Review your medicines with your doctor. Some medicines can make you feel dizzy. This can increase your chance of falling. Ask your doctor what other things that you can do to help prevent falls. This information is not intended to replace advice given to you by your health care provider. Make sure you discuss any questions you have with your health care provider. Document Released: 03/03/2009 Document Revised: 10/13/2015 Document Reviewed: 06/11/2014 Elsevier Interactive Patient Education  2017 [employer].

## 2022-08-01 NOTE — Progress Notes (Signed)
Subjective:   [name] is a 67 y.o. female who presents for Medicare Annual (Subsequent) preventive examination.  Review of Systems   Virtual Visit via Telephone Note  I connected with  Garret Reddish on 08/01/22 at  8:45 AM EDT by telephone and verified that I am speaking with the correct person using two identifiers.  Location: Patient: Home Provider: Office Persons participating in the virtual visit: patient/Nurse Health Advisor   I discussed the limitations, risks, security and privacy concerns of performing an evaluation and management service by telephone and the availability of in person appointments. The patient expressed understanding and agreed to proceed.  Interactive audio and video telecommunications were attempted between this nurse and patient, however failed, due to patient having technical difficulties OR patient did not have access to video capability.  We continued and completed visit with audio only.  Some vital signs may be absent or patient reported.   Criselda Peaches, LPN  Cardiac Risk Factors include: advanced age (>57mn, >>32women);hypertension     Objective:    Today's Vitals   08/01/22 0857  Weight: 152 lb (68.9 kg)  Height: '4\' 8"'$  (1.422 m)   Body mass index is 34.08 kg/m.     08/01/2022    9:04 AM 12/13/2020    3:32 PM 11/04/2018    6:48 PM 05/19/2018   10:04 AM 05/06/2012    4:04 PM 05/02/2012    1:37 PM 05/02/2012   10:11 AM  Advanced Directives  Does Patient Have a Medical Advance Directive? Yes Yes No Yes Patient does not have advance directive;Patient would like information Patient does not have advance directive   Type of Advance Directive H[place] Eye[place] will Living will;Healthcare Power of [place] will     Does patient want to make changes to medical advance directive?  No - Patient declined       Copy of [place]? No - copy requested No - copy  requested       Would patient like information on creating a medical advance directive?     Advance directive packet given    Pre-existing out of facility DNR order (yellow form or pink MOST form)     No No No    Current Medications (verified) Outpatient Encounter Medications as of 08/01/2022  Medication Sig   alendronate (FOSAMAX) 70 MG tablet Take 70 mg by mouth once a week.   celecoxib (CELEBREX) 100 MG capsule Take 1 capsule (100 mg total) by mouth 2 (two) times daily as needed.   fluticasone (FLONASE) 50 MCG/ACT nasal spray Place 2 sprays into both nostrils daily.   hydrochlorothiazide (HYDRODIURIL) 25 MG tablet Take 1 tablet (25 mg total) by mouth daily.   lisinopril (ZESTRIL) 40 MG tablet Take 1 tablet (40 mg total) by mouth daily.   rosuvastatin (CRESTOR) 20 MG tablet TAKE 1 TABLET BY MOUTH DAILY. GENERIC EQUIVALENT FOR CRESTOR. NEEDS AN APPOINTMENT   tolterodine (DETROL LA) 4 MG 24 hr capsule Take 1 capsule (4 mg total) by mouth daily.   traZODone (DESYREL) 50 MG tablet TAKE 0.5-1 TABLETS BY MOUTH AT BEDTIME AS NEEDED FOR SLEEP.   VITAMIN D PO Take 2,000 Units by mouth daily.   Facility-Administered Encounter Medications as of 08/01/2022  Medication   0.9 %  sodium chloride infusion    Allergies (verified) Patient has no known allergies.   History: Past Medical History:  Diagnosis Date   B12 DEFICIENCY 02/09/2009  Qualifier: Diagnosis of  By: Ronnald Ramp RN, CGRN, Sheri     Blood in stool    COLONIC POLYPS, HYPERPLASTIC, HX OF 02/03/2009   Qualifier: Diagnosis of  By: Surface RN, [facility], COLON 02/03/2009   Qualifier: Diagnosis of  By: Varney Daily RN, Donna     Dyslipidemia 06/20/2012   Fracture, intertrochanteric, left femur ([place]) 05/02/2012   GERD (gastroesophageal reflux disease)    Hearing loss    Bil/has hearing aids   Hemorrhoids    HEMORRHOIDS 02/03/2009   Qualifier: Diagnosis of  By: Surface RN, Butch Penny     Hypertension    INFLAMMATORY BOWEL DISEASE -  Followed by Dr. 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 and d/c this  at age 68yr  Ulcerative colitis    Past Surgical History:  Procedure Laterality Date   COLONOSCOPY  last 03/25/2013   [place]ERNIA REPAIR  2009   lt ing    [place]    lt thumb [place] bite   INTRAMEDULLARY (IM) NAIL INTERTROCHANTERIC  05/02/2012   Procedure: INTRAMEDULLARY (IM) NAIL INTERTROCHANTRIC;  Surgeon: JJohnny Bridge MD;  [facility]  Service: Orthopedics;  Laterality: Left;   ORIF FINGER FRACTURE  06/14/2011   Procedure: OPEN REDUCTION INTERNAL FIXATION (ORIF) METACARPAL (FINGER) FRACTURE;  Surgeon: KTennis Must MD;  Location: [facility]  Service: Orthopedics;  Laterality: Right;  right ring   POLYPECTOMY     TONSILLECTOMY     UPPER GASTROINTESTINAL ENDOSCOPY     Family History  Problem Relation Age of Onset   Lymphoma Mother        nhl-mantle cell   Lymphoma Father        nhl   Healthy Sister    Melanoma Maternal Grandmother    Heart disease Maternal Grandfather    Ovarian cancer Paternal Grandmother    Pancreatic cancer Paternal Grandfather    Colon cancer Neg Hx    Esophageal cancer Neg Hx    Stomach cancer Neg Hx    Rectal cancer Neg Hx    Breast cancer Neg Hx    Colon polyps Neg Hx    Social History   Socioeconomic History   Marital status: Married    Spouse name: Not on file   Number of children: Not on file   Years of education: Not on file   Highest education level: Not on file  Occupational History   Not on file  Tobacco Use   Smoking status: Never   Smokeless tobacco: Never  Vaping Use   Vaping Use: Never used  Substance and Sexual Activity   Alcohol use: Yes    Alcohol/week: 1.0 standard drink of alcohol    Types: 1 Glasses of wine per week    Comment: occ.   Drug use: No   Sexual activity: Not on file  Other Topics Concern   Not on  file  Social History Narrative   Works at 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[place]   [place]    Social Determinants of Health   Financial Resource Strain: [place] (08/01/2022)   Overall Financial Resource Strain (CARDIA)    Difficulty of Paying Living Expenses:  Not hard at all  Food Insecurity: No Food Insecurity (08/01/2022)   Hunger Vital Sign    Worried About Running Out of Food in the Last Year: Never true    Ran Out of Food in the Last Year: Never true  Transportation Needs: No Transportation Needs (08/01/2022)   PRAPARE - Hydrologist (Medical): No    Lack of Transportation (Non-Medical): No  Physical Activity: Sufficiently Active (08/01/2022)   Exercise Vital Sign    Days of Exercise per Week: 4 days    Minutes of Exercise per Session: 60 min  Stress: No Stress Concern Present (08/01/2022)   [place]    Feeling of Stress : Not at all  Social Connections: [place] (08/01/2022)   Social Connection and Isolation Panel [NHANES]    Frequency of Communication with Friends and Family: More than three times a week    Frequency of Social Gatherings with Friends and Family: More than three times a week    Attends Religious Services: More than 4 times per year    Active Member of [employer] or Organizations: Yes    Attends Music therapist: More than 4 times per year    Marital Status: Married    Tobacco Counseling Counseling given: Not Answered   Clinical Intake:  Pre-visit preparation completed: No  Pain : No/denies pain     BMI - recorded: 34.08 Nutritional Status: BMI > 30  Obese Nutritional Risks: None Diabetes: No  How often do you need to have someone help you when you read instructions, pamphlets, or other written materials from your doctor or pharmacy?: 1 -  Never  Diabetic?  No  Interpreter Needed?: No  Information entered by :: Rolene Arbour LPN   Activities of Daily Living    08/01/2022    9:02 AM  In your present state of health, do you have any difficulty performing the following activities:  Hearing? 1  Comment Wears hearing aids  Vision? 0  Difficulty concentrating or making decisions? 0  Walking or climbing stairs? 0  Dressing or bathing? 0  Doing errands, shopping? 0  Preparing Food and eating ? N  Using the Toilet? N  In the past six months, have you accidently leaked urine? N  Do you have problems with loss of bowel control? N  Managing your Medications? N  Managing your Finances? N  Housekeeping or managing your Housekeeping? N    Patient Care Team: Farrel Conners, MD as PCP - General (Family Medicine) Aloha Gell, MD as Consulting Physician (Obstetrics and Gynecology) Loletha Carrow Kirke Corin, MD as Consulting Physician (Gastroenterology)  Indicate any recent Medical Services you may have received from other than [facility] providers in the past year (date may be approximate).     Assessment:   This is a routine wellness examination for [place].  Hearing/Vision screen Hearing Screening - Comments:: Wears hearing ids Vision Screening - Comments:: Wears rx glasses - up to date with routine eye exams with  [place] issues and exercise activities discussed: Exercise limited by: None identified   Goals Addressed               This Visit's Progress     Lose weight (pt-stated)        Continue to lose weight.       Depression Screen    08/01/2022    9:01 AM 06/18/2022    2:01  PM 05/08/2022    1:21 PM 04/17/2022    4:45 PM 03/20/2022    3:38 PM 04/27/2021   11:27 AM 09/14/2020   10:47 AM  PHQ 2/9 Scores  PHQ - 2 Score 0 0 0 0 0 0 0  PHQ- 9 Score 0 '2 1 1 4 2 2    '$ Fall Risk    08/01/2022    9:03 AM 05/08/2022    1:21 PM 03/20/2022    3:39 PM 04/27/2021   11:27 AM  Fall Risk    Falls in the past year? 0 0 0 0  Number falls in past yr: 0 0 0   Injury with Fall? 0 0 0   Risk for fall due to : No Fall Risks No Fall Risks No Fall Risks   Follow up Falls prevention discussed Falls evaluation completed Falls evaluation completed     [place]:  Any stairs in or around the home? Yes  If so, are there any without handrails? No  Home free of loose throw rugs in walkways, pet beds, electrical cords, etc? Yes  Adequate lighting in your home to reduce risk of falls? Yes   ASSISTIVE DEVICES UTILIZED TO PREVENT FALLS:  Life alert? No  Use of a cane, walker or w/c? No  Grab bars in the bathroom? Yes Shower chair or bench in shower? No  Elevated toilet seat or a handicapped toilet? No   TIMED UP AND GO:  Was the test performed? No . Audio Visit  Cognitive Function:        08/01/2022    9:04 AM  6CIT Screen  What Year? 0 points  What month? 0 points  What time? 0 points  Count back from 20 0 points  Months in reverse 0 points  Repeat phrase 0 points  Total Score 0 points    Immunizations Immunization History  Administered Date(s) Administered   Influenza Split 02/18/2013   Influenza-Unspecified 02/19/2016, 02/18/2017, 01/19/2018, 03/04/2020, 02/18/2021   PFIZER(Purple Top)SARS-COV-2 Vaccination 05/16/2019, 06/05/2019, 04/04/2020   PNEUMOCOCCAL CONJUGATE-20 03/20/2022   Td 05/21/1998   Tdap 08/17/2013    TDAP status: Up to date  Flu Vaccine status: Up to date  Pneumococcal vaccine status: Up to date  Covid-19 vaccine status: Completed vaccines  Qualifies for Shingles Vaccine? Yes   Zostavax completed No   Shingrix Completed?: No.    Education has been provided regarding the importance of this vaccine. Patient has been advised to call insurance company to determine out of pocket expense if they have not yet received this vaccine. Advised may also receive vaccine at local pharmacy or Health Dept. Verbalized  acceptance and understanding.  Screening Tests Health Maintenance  Topic Date Due   INFLUENZA VACCINE  08/20/2022 (Originally 12/19/2021)   COVID-19 Vaccine (4 - 2023-24 season) 08/20/2022 (Originally 01/19/2022)   Zoster Vaccines- Shingrix (1 of 2) 09/18/2022 (Originally 07/12/2006)   Medicare Annual Wellness (AWV)  08/01/2023   DTaP/Tdap/Td (3 - Td or Tdap) 08/18/2023   MAMMOGRAM  05/28/2024   COLONOSCOPY (Pts 45-53yr Insurance coverage will need to be confirmed)  05/19/2028   Pneumonia Vaccine 67 Years old  Completed   DEXA SCAN  Completed   Hepatitis C Screening  Completed   HPV VACCINES  Aged Out    Health Maintenance  There are no preventive care reminders to display for this patient.  Colorectal cancer screening: Type of screening: Colonoscopy. Completed 03/25/13. Repeat every 10 years  Mammogram status: Completed 05/28/22.  Repeat every year  Bone Density status: Completed 08/07/21. Results reflect: Bone density results: OSTEOPOROSIS. Repeat every   years.  Lung Cancer Screening: (Low Dose CT Chest recommended if Age 10-80 years, 30 pack-year currently smoking OR have quit w/in 15years.) does not qualify.     Additional Screening:  Hepatitis C Screening: does qualify; Completed 06/03/17  Vision Screening: Recommended annual ophthalmology exams for early detection of glaucoma and other disorders of the eye. Is the patient up to date with their annual eye exam?  Yes  Who is the provider or what is the name of the office in which the patient attends annual eye exams? [facility] If pt is not established with a provider, would they like to be referred to a provider to establish care? No .   Dental Screening: Recommended annual dental exams for proper oral hygiene  Community Resource Referral / Chronic Care Management:  CRR required this visit?  No   CCM required this visit?  No      Plan:     I have personally reviewed and noted the following in the patient's  chart:   Medical and social history Use of alcohol, tobacco or illicit drugs  Current medications and supplements including opioid prescriptions. Patient is not currently taking opioid prescriptions. Functional ability and status Nutritional status Physical activity Advanced directives List of other physicians Hospitalizations, surgeries, and ER visits in previous 12 months Vitals Screenings to include cognitive, depression, and falls Referrals and appointments  In addition, I have reviewed and discussed with patient certain preventive protocols, quality metrics, and best practice recommendations. A written personalized care plan for preventive services as well as general preventive health recommendations were provided to patient.     Criselda Peaches, LPN   [id number]   Nurse Notes: None

## 2022-08-02 DIAGNOSIS — Z961 Presence of intraocular lens: Secondary | ICD-10-CM | POA: Diagnosis not present

## 2022-09-09 ENCOUNTER — Other Ambulatory Visit: Payer: Self-pay | Admitting: Family Medicine

## 2022-09-09 DIAGNOSIS — G4700 Insomnia, unspecified: Secondary | ICD-10-CM

## 2022-10-08 ENCOUNTER — Encounter: Payer: Medicare Other | Admitting: Family Medicine

## 2022-10-29 ENCOUNTER — Other Ambulatory Visit: Payer: Self-pay | Admitting: Family Medicine

## 2022-10-29 DIAGNOSIS — I10 Essential (primary) hypertension: Secondary | ICD-10-CM

## 2022-11-15 ENCOUNTER — Encounter: Payer: Medicare Other | Admitting: Family Medicine

## 2022-11-19 ENCOUNTER — Encounter: Payer: Self-pay | Admitting: Family Medicine

## 2022-11-19 ENCOUNTER — Ambulatory Visit (INDEPENDENT_AMBULATORY_CARE_PROVIDER_SITE_OTHER): Payer: Medicare Other | Admitting: Family Medicine

## 2022-11-19 VITALS — BP 128/78 | HR 85 | Temp 98.4°F | Ht <= 58 in | Wt 155.0 lb

## 2022-11-19 DIAGNOSIS — I10 Essential (primary) hypertension: Secondary | ICD-10-CM | POA: Diagnosis not present

## 2022-11-19 DIAGNOSIS — E559 Vitamin D deficiency, unspecified: Secondary | ICD-10-CM | POA: Diagnosis not present

## 2022-11-19 DIAGNOSIS — E785 Hyperlipidemia, unspecified: Secondary | ICD-10-CM

## 2022-11-19 DIAGNOSIS — R531 Weakness: Secondary | ICD-10-CM | POA: Diagnosis not present

## 2022-11-19 DIAGNOSIS — Z0000 Encounter for general adult medical examination without abnormal findings: Secondary | ICD-10-CM | POA: Diagnosis not present

## 2022-11-19 DIAGNOSIS — G629 Polyneuropathy, unspecified: Secondary | ICD-10-CM

## 2022-11-19 LAB — COMPREHENSIVE METABOLIC PANEL
ALT: 26 U/L (ref 0–35)
AST: 26 U/L (ref 0–37)
Albumin: 4.7 g/dL (ref 3.5–5.2)
Alkaline Phosphatase: 66 U/L (ref 39–117)
BUN: 27 mg/dL — ABNORMAL HIGH (ref 6–23)
CO2: 26 mEq/L (ref 19–32)
Calcium: 10.2 mg/dL (ref 8.4–10.5)
Chloride: 102 mEq/L (ref 96–112)
Creatinine, Ser: 0.92 mg/dL (ref 0.40–1.20)
GFR: 64.95 mL/min (ref >60.00)
Glucose, Bld: 94 mg/dL (ref 70–99)
Potassium: 4 mEq/L (ref 3.5–5.1)
Sodium: 140 mEq/L (ref 135–145)
Total Bilirubin: 0.7 mg/dL (ref 0.2–1.2)
Total Protein: 7.2 g/dL (ref 6.0–8.3)

## 2022-11-19 LAB — LIPID PANEL
Cholesterol: 185 mg/dL (ref 0–200)
HDL: 58.3 mg/dL (ref >39.00)
LDL Cholesterol: 111 mg/dL — ABNORMAL HIGH (ref 0–99)
NonHDL: 126.55
Total CHOL/HDL Ratio: 3
Triglycerides: 80 mg/dL (ref 0.0–149.0)
VLDL: 16 mg/dL (ref 0.0–40.0)

## 2022-11-19 LAB — VITAMIN B12: Vitamin B-12: 254 pg/mL (ref 211–911)

## 2022-11-19 LAB — VITAMIN D 25 HYDROXY (VIT D DEFICIENCY, FRACTURES): VITD: 49.34 ng/mL (ref 30.00–100.00)

## 2022-11-19 MED ORDER — ROSUVASTATIN CALCIUM 20 MG PO TABS: ORAL_TABLET | ORAL | 1 refills | Status: DC

## 2022-11-19 NOTE — Patient Instructions (Signed)

## 2022-11-19 NOTE — Progress Notes (Signed)
Complete physical exam  Patient: [name]   DOB: 12/02/1955   67 y.o. Female  MRN: [id number]  Subjective:    Chief Complaint  Patient presents with   Annual Exam    [name] is a 67 y.o. female who presents today for a complete physical exam. She reports consuming a general diet. Gym/ health club routine includes water aerobics twice a week and also light weights once or twice a week. She generally feels well. She reports sleeping fairly well. She does not have additional problems to discuss today.    Most recent fall risk assessment:    11/19/2022    8:32 AM  Fall Risk   Falls in the past year? 0  Number falls in past yr: 0  Injury with Fall? 0  Risk for fall due to : No Fall Risks  Follow up Falls evaluation completed     Most recent depression screenings:    11/19/2022    8:31 AM 08/01/2022    9:01 AM  PHQ 2/9 Scores  PHQ - 2 Score 0 0  PHQ- 9 Score 3 0    Vision:Within last year and Dental: No current dental problems and Receives regular dental care  Patient Active Problem List   Diagnosis Date Noted   Essential hypertension 02/03/2009    Priority: 1.   Dyslipidemia 06/20/2012    Priority: 2.   Osteopenia/?Osteoporosis - followed by her gynecologist, Dr. Ernestina Penna 06/20/2012    Priority: 4.   Acute leg pain, left 07/19/2022   H/O Turner syndrome 05/02/2012   INFLAMMATORY BOWEL DISEASE - Followed by GI 02/03/2009   COLONIC POLYPS, HYPERPLASTIC, HX OF 02/03/2009      Patient Care Team: Karie Georges, MD as PCP - General (Family Medicine) Noland Fordyce, MD as Consulting Physician (Obstetrics and Gynecology) Myrtie Neither Andreas Blower, MD as Consulting Physician (Gastroenterology)   Outpatient Medications Prior to Visit  Medication Sig   alendronate (FOSAMAX) 70 MG tablet Take 70 mg by mouth once a week.   celecoxib (CELEBREX) 100 MG capsule Take 1 capsule (100 mg total) by mouth 2 (two) times daily as needed.   fluticasone (FLONASE) 50 MCG/ACT  nasal spray Place 2 sprays into both nostrils daily.   hydrochlorothiazide (HYDRODIURIL) 25 MG tablet Take 1 tablet (25 mg total) by mouth daily.   lisinopril (ZESTRIL) 40 MG tablet Take 1 tablet (40 mg total) by mouth daily.   tolterodine (DETROL LA) 4 MG 24 hr capsule Take 1 capsule (4 mg total) by mouth daily.   traZODone (DESYREL) 50 MG tablet TAKE 1/2 TO 1 TABLET BY MOUTH AT BEDTIME AS NEEDED FOR SLEEP   VITAMIN D PO Take 2,000 Units by mouth daily.   [DISCONTINUED] rosuvastatin (CRESTOR) 20 MG tablet TAKE 1 TABLET BY MOUTH DAILY. GENERIC EQUIVALENT FOR CRESTOR. NEEDS AN APPOINTMENT   Facility-Administered Medications Prior to Visit  Medication Dose Route Frequency Provider   0.9 %  sodium chloride infusion  500 mL Intravenous Once Danis, Andreas Blower, MD    Review of Systems  HENT:  Negative for hearing loss.   Eyes:  Negative for blurred vision.  Respiratory:  Negative for shortness of breath.   Cardiovascular:  Negative for chest pain.  Gastrointestinal: Negative.   Genitourinary: Negative.   Musculoskeletal:  Negative for back pain.  Neurological:  Negative for headaches.  Psychiatric/Behavioral:  Negative for depression.   All other systems reviewed and are negative.      Objective:  BP 128/78 (BP Location: Left Arm, Patient Position: Sitting, Cuff Size: Normal)   Pulse 85   Temp 98.4 F (36.9 C) (Oral)   Ht 4\' 8"  (1.422 m)   Wt 155 lb (70.3 kg)   SpO2 99%   BMI 34.75 kg/m    Physical Exam Vitals reviewed.  Constitutional:      Appearance: Normal appearance. She is well-groomed and normal weight.  HENT:     Right Ear: Tympanic membrane and ear canal normal.     Left Ear: Tympanic membrane and ear canal normal.     Mouth/Throat:     Mouth: Mucous membranes are moist.     Pharynx: No posterior oropharyngeal erythema.  Eyes:     Conjunctiva/sclera: Conjunctivae normal.  Neck:     Thyroid: No thyromegaly.  Cardiovascular:     Rate and Rhythm: Normal  rate and regular rhythm.     Pulses: Normal pulses.     Heart sounds: S1 normal and S2 normal.  Pulmonary:     Effort: Pulmonary effort is normal.     Breath sounds: Normal breath sounds and air entry.  Abdominal:     General: Bowel sounds are normal.  Musculoskeletal:     Right lower leg: No edema.     Left lower leg: No edema.  Lymphadenopathy:     Cervical: No cervical adenopathy.  Neurological:     Mental Status: She is alert and oriented to person, place, and time. Mental status is at baseline.     Gait: Gait is intact.  Psychiatric:        Mood and Affect: Mood and affect normal.        Speech: Speech normal.        Behavior: Behavior normal.        Judgment: Judgment normal.      No results found for any visits on 11/19/22.     Assessment & Plan:    Routine Health Maintenance and Physical Exam  Immunization History  Administered Date(s) Administered   Influenza Split 02/18/2013   Influenza-Unspecified 02/19/2016, 02/18/2017, 01/19/2018, 03/04/2020, 02/18/2021   PFIZER(Purple Top)SARS-COV-2 Vaccination 05/16/2019, 06/05/2019, 04/04/2020   PNEUMOCOCCAL CONJUGATE-20 03/20/2022   Rsv, Mab, Nirsevimab-alip, 1 Ml, Neonate To 24 Mos(Beyfortus) 11/02/2022   Td 05/21/1998   Td (Adult), 2 Lf Tetanus Toxid, Preservative Free 05/21/1998   Tdap 08/17/2013, 11/02/2022    Health Maintenance  Topic Date Due   Zoster Vaccines- Shingrix (1 of 2) Never done   COVID-19 Vaccine (4 - 2023-24 season) 12/05/2022 (Originally 01/19/2022)   INFLUENZA VACCINE  12/20/2022   Medicare Annual Wellness (AWV)  08/01/2023   MAMMOGRAM  05/28/2024   Colonoscopy  05/19/2028   DTaP/Tdap/Td (5 - Td or Tdap) 11/01/2032   Pneumonia Vaccine 19+ Years old  Completed   DEXA SCAN  Completed   Hepatitis C Screening  Completed   HPV VACCINES  Aged Out    Discussed health benefits of physical activity, and encouraged her to engage in regular exercise appropriate for her age and  condition.  Dyslipidemia -     Lipid panel; Future -     Rosuvastatin Calcium; TAKE 1 TABLET BY MOUTH DAILY. GENERIC EQUIVALENT FOR CRESTOR. NEEDS AN APPOINTMENT  Dispense: 90 tablet; Refill: 1  Essential hypertension -     Comprehensive metabolic panel  Routine general medical examination at a health care facility  Neuropathy -     Vitamin B12 -     Iron, TIBC and Ferritin Panel  Vitamin D  deficiency -     VITAMIN D 25 Hydroxy (Vit-D Deficiency, Fractures)  Normal physical exam findings today, patient needs annual labs checked including vitamin levels and iron panel due to her continued issues with left leg chronic pain. Counseled patient on good sleep habits and hygiene, handouts given on healthy eating and exercise.   Return in 6 months (on 05/22/2023).     Karie Georges, MD

## 2022-11-20 LAB — IRON,TIBC AND FERRITIN PANEL
%SAT: 22 % (calc) (ref 16–45)
Ferritin: 18 ng/mL (ref 16–288)
Iron: 81 ug/dL (ref 45–160)
TIBC: 370 mcg/dL (calc) (ref 250–450)

## 2022-12-10 DIAGNOSIS — M48061 Spinal stenosis, lumbar region without neurogenic claudication: Secondary | ICD-10-CM | POA: Diagnosis not present

## 2022-12-10 DIAGNOSIS — M25552 Pain in left hip: Secondary | ICD-10-CM | POA: Diagnosis not present

## 2022-12-12 ENCOUNTER — Other Ambulatory Visit: Payer: Self-pay | Admitting: Family Medicine

## 2022-12-12 DIAGNOSIS — G4700 Insomnia, unspecified: Secondary | ICD-10-CM

## 2022-12-18 DIAGNOSIS — K080 Exfoliation of teeth due to systemic causes: Secondary | ICD-10-CM | POA: Diagnosis not present

## 2023-01-25 DIAGNOSIS — M48061 Spinal stenosis, lumbar region without neurogenic claudication: Secondary | ICD-10-CM | POA: Diagnosis not present

## 2023-03-08 ENCOUNTER — Other Ambulatory Visit: Payer: Self-pay | Admitting: Family Medicine

## 2023-03-08 DIAGNOSIS — E785 Hyperlipidemia, unspecified: Secondary | ICD-10-CM

## 2023-03-18 DIAGNOSIS — M48061 Spinal stenosis, lumbar region without neurogenic claudication: Secondary | ICD-10-CM | POA: Diagnosis not present

## 2023-03-18 NOTE — Telephone Encounter (Signed)
Pt requests this be filled at  [id number] [place], [place] - [address] Phone: [phone]  Fax: [phone]    She is out.

## 2023-03-29 DIAGNOSIS — M25572 Pain in left ankle and joints of left foot: Secondary | ICD-10-CM | POA: Diagnosis not present

## 2023-05-14 ENCOUNTER — Other Ambulatory Visit: Payer: Self-pay | Admitting: Family Medicine

## 2023-05-14 DIAGNOSIS — R0982 Postnasal drip: Secondary | ICD-10-CM

## 2023-05-16 DIAGNOSIS — H60501 Unspecified acute noninfective otitis externa, right ear: Secondary | ICD-10-CM | POA: Diagnosis not present

## 2023-05-16 DIAGNOSIS — J069 Acute upper respiratory infection, unspecified: Secondary | ICD-10-CM | POA: Diagnosis not present

## 2023-05-16 DIAGNOSIS — H6121 Impacted cerumen, right ear: Secondary | ICD-10-CM | POA: Diagnosis not present

## 2023-05-16 DIAGNOSIS — R000 Tachycardia, unspecified: Secondary | ICD-10-CM | POA: Diagnosis not present

## 2023-05-20 ENCOUNTER — Encounter: Payer: Self-pay | Admitting: Family Medicine

## 2023-05-20 ENCOUNTER — Ambulatory Visit (INDEPENDENT_AMBULATORY_CARE_PROVIDER_SITE_OTHER): Payer: Medicare Other | Admitting: Family Medicine

## 2023-05-20 VITALS — BP 118/60 | HR 98 | Temp 98.2°F | Wt 165.8 lb

## 2023-05-20 DIAGNOSIS — J019 Acute sinusitis, unspecified: Secondary | ICD-10-CM

## 2023-05-20 MED ORDER — AMOXICILLIN-POT CLAVULANATE 875-125 MG PO TABS: 1.0000 | ORAL_TABLET | Freq: Two times a day (BID) | ORAL | 0 refills | Status: DC

## 2023-05-20 NOTE — Progress Notes (Signed)
   Subjective:    Patient ID: [name], female    DOB: 1955-07-05, 67 y.o.   MRN: [id number]  HPI Here to follow up on an urgent care visit on 05-16-23. For the past 2 weeks she has had sinus congestion, PND, and coughing up green sputum.  No fever. She developed right ear pain, so she went to the urgent care. The right ear canal was irrigated to clear cerumen, and she was diagnosed with an otitis externa. She was prescribed Ciprodex drops, but she never got this filled because she was afraid it may damage her hearing aid. The ear pain has now resolved.    Review of Systems  Constitutional: Negative.   HENT:  Positive for congestion, postnasal drip and sinus pressure. Negative for ear pain and sore throat.   Eyes: Negative.   Respiratory:  Positive for cough. Negative for shortness of breath and wheezing.        Objective:   Physical Exam Constitutional:      Appearance: Normal appearance. She is not ill-appearing.  HENT:     Right Ear: Tympanic membrane, ear canal and external ear normal.     Left Ear: Tympanic membrane, ear canal and external ear normal.     Nose: Nose normal.     Mouth/Throat:     Mouth: Mucous membranes are moist.  Eyes:     Conjunctiva/sclera: Conjunctivae normal.  Pulmonary:     Effort: Pulmonary effort is normal.     Breath sounds: Normal breath sounds.  Lymphadenopathy:     Cervical: No cervical adenopathy.  Neurological:     Mental Status: She is alert.           Assessment & Plan:  She has a sinusitis, and we will treat this with 10 days of Augmentin. If she had an OE, this has resolved.  Gershon Crane, MD

## 2023-05-21 ENCOUNTER — Other Ambulatory Visit: Payer: Self-pay | Admitting: Obstetrics

## 2023-05-21 DIAGNOSIS — M810 Age-related osteoporosis without current pathological fracture: Secondary | ICD-10-CM

## 2023-05-21 DIAGNOSIS — Z1331 Encounter for screening for depression: Secondary | ICD-10-CM | POA: Diagnosis not present

## 2023-05-21 DIAGNOSIS — N3941 Urge incontinence: Secondary | ICD-10-CM | POA: Diagnosis not present

## 2023-05-21 DIAGNOSIS — B372 Candidiasis of skin and nail: Secondary | ICD-10-CM | POA: Diagnosis not present

## 2023-05-21 DIAGNOSIS — Z01411 Encounter for gynecological examination (general) (routine) with abnormal findings: Secondary | ICD-10-CM | POA: Diagnosis not present

## 2023-05-27 ENCOUNTER — Ambulatory Visit: Payer: Medicare Other | Admitting: Family Medicine

## 2023-06-13 ENCOUNTER — Ambulatory Visit: Payer: Medicare Other | Admitting: Family Medicine

## 2023-06-17 DIAGNOSIS — Z1231 Encounter for screening mammogram for malignant neoplasm of breast: Secondary | ICD-10-CM | POA: Diagnosis not present

## 2023-06-25 ENCOUNTER — Encounter: Payer: Self-pay | Admitting: Family Medicine

## 2023-06-25 ENCOUNTER — Ambulatory Visit: Payer: Medicare Other | Admitting: Family Medicine

## 2023-06-25 VITALS — BP 122/64 | HR 113 | Temp 97.5°F | Wt 160.6 lb

## 2023-06-25 DIAGNOSIS — I10 Essential (primary) hypertension: Secondary | ICD-10-CM | POA: Diagnosis not present

## 2023-06-25 DIAGNOSIS — D72821 Monocytosis (symptomatic): Secondary | ICD-10-CM

## 2023-06-25 DIAGNOSIS — D696 Thrombocytopenia, unspecified: Secondary | ICD-10-CM

## 2023-06-25 DIAGNOSIS — R000 Tachycardia, unspecified: Secondary | ICD-10-CM | POA: Diagnosis not present

## 2023-06-25 LAB — CBC WITH DIFFERENTIAL/PLATELET
Basophils Absolute: 0.1 10*3/uL (ref 0.0–0.1)
Basophils Relative: 0.6 % (ref 0.0–3.0)
Eosinophils Absolute: 0 10*3/uL (ref 0.0–0.7)
Eosinophils Relative: 0.4 % (ref 0.0–5.0)
HCT: 40.8 % (ref 36.0–46.0)
Hemoglobin: 13.2 g/dL (ref 12.0–15.0)
Lymphocytes Relative: 20.4 % (ref 12.0–46.0)
Lymphs Abs: 2.3 10*3/uL (ref 0.7–4.0)
MCHC: 32.3 g/dL (ref 30.0–36.0)
MCV: 88 fL (ref 78.0–100.0)
Monocytes Absolute: 2.5 10*3/uL — ABNORMAL HIGH (ref 0.1–1.0)
Monocytes Relative: 22.8 % — ABNORMAL HIGH (ref 3.0–12.0)
Neutro Abs: 6.2 10*3/uL (ref 1.4–7.7)
Neutrophils Relative %: 55.8 % (ref 43.0–77.0)
Platelets: 76 10*3/uL — ABNORMAL LOW (ref 150.0–400.0)
RBC: 4.64 Mil/uL (ref 3.87–5.11)
RDW: 14.1 % (ref 11.5–15.5)
WBC: 11.1 10*3/uL — ABNORMAL HIGH (ref 4.0–10.5)

## 2023-06-25 NOTE — Progress Notes (Signed)
 Established Patient Office Visit  Subjective   Patient ID: [name], female    DOB: [birth year]  Age: 68 y.o. MRN: [id number]  Chief Complaint  Patient presents with   Medical Management of Chronic Issues   Hypertension    Pt is here for follow up visit today on HTN and leg pain  Pt reports she is seeing the specialist for her left leg pain, states she has had 2 injections which haven't helped, is going back to see her specialist soon, they are considering doing an MRI of her spine. Has a follow up with him on Wednesday next week.   HTN -- BP in office performed and is well controlled. She  reports no side effects to the medications, no chest pain, SOB, dizziness or headaches. She has a BP cuff at home and is checking BP regularly, reports they are in the normal range.   Pt was concerned about her high heart rate. She reports that she was told it was high the last time she was here for a sinus infection. States today there is no chest pain, no SOB or headaches, does report PND that is left over. Pt reports she drinks a lot of diet coke, doesn't like to drink a lot of water.      Current Outpatient Medications  Medication Instructions   alendronate  (FOSAMAX ) 70 mg, Weekly   amoxicillin -clavulanate (AUGMENTIN ) 875-125 MG tablet 1 tablet, Oral, 2 times daily   celecoxib  (CELEBREX ) 100 mg, Oral, 2 times daily PRN   fluticasone  (FLONASE ) 50 MCG/ACT nasal spray SPRAY 2 SPRAYS INTO EACH NOSTRIL EVERY DAY   hydrochlorothiazide  (HYDRODIURIL ) 25 mg, Oral, Daily   lisinopril  (ZESTRIL ) 40 mg, Oral, Daily   rosuvastatin  (CRESTOR ) 20 MG tablet TAKE 1 TABLET BY MOUTH DAILY. GENERIC EQUIVALENT FOR CRESTOR . NEEDS AN APPOINTMENT   tolterodine  (DETROL  LA) 4 mg, Oral, Daily   traZODone  (DESYREL ) 25-50 mg, Oral, At bedtime PRN, for sleep   VITAMIN D  PO 2,000 Units, Daily    Patient Active Problem List   Diagnosis Date Noted   Essential hypertension 02/03/2009    Priority: 1.   Dyslipidemia  06/20/2012    Priority: 2.   Osteopenia/?Osteoporosis - followed by her gynecologist, Dr. Kandyce 06/20/2012    Priority: 4.   Acute leg pain, left 07/19/2022   H/O Turner syndrome 05/02/2012   INFLAMMATORY BOWEL DISEASE - Followed by GI 02/03/2009   History of colonic polyps 02/03/2009      Review of Systems  All other systems reviewed and are negative.     Objective:     BP 122/64   Pulse (!) 113   Temp (!) 97.5 F (36.4 C)   Wt 160 lb 9.6 oz (72.8 kg)   SpO2 99%   BMI 36.01 kg/m    Physical Exam Vitals reviewed.  Constitutional:      Appearance: Normal appearance.  HENT:     Nose: No rhinorrhea.     Mouth/Throat:     Mouth: Mucous membranes are moist.     Pharynx: No posterior oropharyngeal erythema.  Cardiovascular:     Rate and Rhythm: Regular rhythm. Tachycardia present.     Heart sounds: Normal heart sounds.  Pulmonary:     Effort: Pulmonary effort is normal.     Breath sounds: Normal breath sounds. No wheezing or rales.  Neurological:     Mental Status: She is alert and oriented to person, place, and time. Mental status is at baseline.    Last metabolic  panel Lab Results  Component Value Date   GLUCOSE 94 11/19/2022   NA 140 11/19/2022   K 4.0 11/19/2022   CL 102 11/19/2022   CO2 26 11/19/2022   BUN 27 (H) 11/19/2022   CREATININE 0.92 11/19/2022   GFR 64.95 11/19/2022   CALCIUM  10.2 11/19/2022   PROT 7.2 11/19/2022   ALBUMIN  4.7 11/19/2022   BILITOT 0.7 11/19/2022   ALKPHOS 66 11/19/2022   AST 26 11/19/2022   ALT 26 11/19/2022      The 10-year ASCVD risk score (Arnett DK, et al., 2019) is: 7.1%    Assessment & Plan:  Essential hypertension Assessment & Plan: Current hypertension medications:       Sig   hydrochlorothiazide  (HYDRODIURIL ) 25 MG tablet (Taking) Take 1 tablet (25 mg total) by mouth daily.   lisinopril  (ZESTRIL ) 40 MG tablet (Taking) Take 1 tablet (40 mg total) by mouth daily.      Chronic, stable, BP is  controlled, continue the above medication as prescribed.    Sinus tachycardia Unclear etiology, will add CBC today to look for anemia, pt has no other associated symptoms. Her URI was 05/20/23 so I would assume that any acute phase symptoms should have resolved by now since it has been over 1 month since her illness.  -     CBC with Differential/Platelet  Thrombocytopenia (HCC) -     Ambulatory referral to Hematology / Oncology  Monocytosis -     Ambulatory referral to Hematology / Oncology     Return in about 6 months (around 12/23/2023).    Heron CHRISTELLA Sharper, MD

## 2023-06-25 NOTE — Patient Instructions (Signed)
Reduce caffeine intake, increase free water intake

## 2023-06-26 ENCOUNTER — Other Ambulatory Visit: Payer: Self-pay | Admitting: Nurse Practitioner

## 2023-06-26 DIAGNOSIS — D696 Thrombocytopenia, unspecified: Secondary | ICD-10-CM

## 2023-06-26 DIAGNOSIS — D72821 Monocytosis (symptomatic): Secondary | ICD-10-CM

## 2023-06-27 NOTE — Assessment & Plan Note (Signed)
 Current hypertension medications:       Sig   hydrochlorothiazide  (HYDRODIURIL ) 25 MG tablet (Taking) Take 1 tablet (25 mg total) by mouth daily.   lisinopril  (ZESTRIL ) 40 MG tablet (Taking) Take 1 tablet (40 mg total) by mouth daily.      Chronic, stable, BP is controlled, continue the above medication as prescribed.

## 2023-07-01 ENCOUNTER — Inpatient Hospital Stay: Payer: Medicare Other | Attending: Nurse Practitioner | Admitting: Nurse Practitioner

## 2023-07-01 ENCOUNTER — Ambulatory Visit (HOSPITAL_BASED_OUTPATIENT_CLINIC_OR_DEPARTMENT_OTHER): Payer: Medicare Other

## 2023-07-01 ENCOUNTER — Encounter: Payer: Self-pay | Admitting: Nurse Practitioner

## 2023-07-01 ENCOUNTER — Inpatient Hospital Stay: Payer: Medicare Other

## 2023-07-01 VITALS — BP 137/79 | HR 145 | Temp 97.9°F | Resp 18 | Ht <= 58 in | Wt 162.2 lb

## 2023-07-01 DIAGNOSIS — D696 Thrombocytopenia, unspecified: Secondary | ICD-10-CM | POA: Insufficient documentation

## 2023-07-01 DIAGNOSIS — E7800 Pure hypercholesterolemia, unspecified: Secondary | ICD-10-CM | POA: Diagnosis not present

## 2023-07-01 DIAGNOSIS — D72821 Monocytosis (symptomatic): Secondary | ICD-10-CM | POA: Insufficient documentation

## 2023-07-01 DIAGNOSIS — I10 Essential (primary) hypertension: Secondary | ICD-10-CM | POA: Diagnosis not present

## 2023-07-01 DIAGNOSIS — R000 Tachycardia, unspecified: Secondary | ICD-10-CM | POA: Diagnosis not present

## 2023-07-01 DIAGNOSIS — I493 Ventricular premature depolarization: Secondary | ICD-10-CM | POA: Diagnosis not present

## 2023-07-01 LAB — CMP (CANCER CENTER ONLY)
ALT: 17 U/L (ref 0–44)
AST: 20 U/L (ref 15–41)
Albumin: 4.7 g/dL (ref 3.5–5.0)
Alkaline Phosphatase: 50 U/L (ref 38–126)
Anion gap: 11 (ref 5–15)
BUN: 18 mg/dL (ref 8–23)
CO2: 25 mmol/L (ref 22–32)
Calcium: 10 mg/dL (ref 8.9–10.3)
Chloride: 102 mmol/L (ref 98–111)
Creatinine: 1.37 mg/dL — ABNORMAL HIGH (ref 0.44–1.00)
GFR, Estimated: 43 mL/min — ABNORMAL LOW (ref >60)
Glucose, Bld: 154 mg/dL — ABNORMAL HIGH (ref 70–99)
Potassium: 3.9 mmol/L (ref 3.5–5.1)
Sodium: 138 mmol/L (ref 135–145)
Total Bilirubin: 1 mg/dL (ref 0.0–1.2)
Total Protein: 7.4 g/dL (ref 6.5–8.1)

## 2023-07-01 LAB — CBC WITH DIFFERENTIAL (CANCER CENTER ONLY)
Abs Immature Granulocytes: 0.45 10*3/uL — ABNORMAL HIGH (ref 0.00–0.07)
Basophils Absolute: 0 10*3/uL (ref 0.0–0.1)
Basophils Relative: 0 %
Eosinophils Absolute: 0 10*3/uL (ref 0.0–0.5)
Eosinophils Relative: 0 %
HCT: 41.6 % (ref 36.0–46.0)
Hemoglobin: 13.5 g/dL (ref 12.0–15.0)
Immature Granulocytes: 4 %
Lymphocytes Relative: 17 %
Lymphs Abs: 2.1 10*3/uL (ref 0.7–4.0)
MCH: 29 pg (ref 26.0–34.0)
MCHC: 32.5 g/dL (ref 30.0–36.0)
MCV: 89.5 fL (ref 80.0–100.0)
Monocytes Absolute: 2.8 10*3/uL — ABNORMAL HIGH (ref 0.1–1.0)
Monocytes Relative: 23 %
Neutro Abs: 7 10*3/uL (ref 1.7–7.7)
Neutrophils Relative %: 56 %
Platelet Count: 85 10*3/uL — ABNORMAL LOW (ref 150–400)
RBC: 4.65 MIL/uL (ref 3.87–5.11)
RDW: 13.2 % (ref 11.5–15.5)
WBC Count: 12.5 10*3/uL — ABNORMAL HIGH (ref 4.0–10.5)
nRBC: 0 % (ref 0.0–0.2)

## 2023-07-01 LAB — SAVE SMEAR(SSMR), FOR PROVIDER SLIDE REVIEW

## 2023-07-01 LAB — LACTATE DEHYDROGENASE: LDH: 216 U/L — ABNORMAL HIGH (ref 98–192)

## 2023-07-01 NOTE — Progress Notes (Addendum)
 New Hematology/Oncology Consult   Requesting MD: Dr. Nira Conn  [phone]      Reason for Consult: Thrombocytopenia, monocytosis  HPI: [name] is a 68 year old woman referred after a CBC on 06/25/2023 showed thrombocytopenia with a platelet count of 76,000 and monocytosis.  The total white count was mildly elevated at 11.1, absolute monocyte count 2.5, hemoglobin normal 13.2.  Review of prior CBCs in the EMR show a normal platelet count on multiple labs dating to 2018.  Absolute monocyte count also in normal range on multiple CBCs dating to 2018.  No recent infection or illness.  No fevers or sweats.  She has not noticed any enlarged lymph nodes.  Appetite diminished recently.  She estimates about 6 pounds of weight loss over the past 2 weeks, intentional.  She denies bleeding.  She reports significant anxiety regarding today's visit.     Past Medical History:  Diagnosis Date   B12 DEFICIENCY 02/09/2009   Qualifier: Diagnosis of  By: Yetta Barre RN, CGRN, Sheri     Blood in stool    COLONIC POLYPS, HYPERPLASTIC, HX OF 02/03/2009   Qualifier: Diagnosis of  By: Surface RN, Delle Reining, COLON 02/03/2009   Qualifier: Diagnosis of  By: Surface RN, Donna     Dyslipidemia 06/20/2012   Fracture, intertrochanteric, left femur (HCC) 05/02/2012   GERD (gastroesophageal reflux disease)    Hearing loss    Bil/has hearing aids   Hemorrhoids    HEMORRHOIDS 02/03/2009   Qualifier: Diagnosis of  By: Surface RN, Lupita Leash     Hypertension    INFLAMMATORY BOWEL DISEASE - Followed by Dr. Jarold Motto in GI 02/03/2009   Qualifier: Diagnosis of  By: Surface RN, Glorianne Manchester    Turner's syndrome    was on Provera and Premarin and d/c this  at age 28yr   Ulcerative colitis      Past Surgical History:  Procedure Laterality Date   COLONOSCOPY  last 03/25/2013   HERNIA REPAIR  2009   lt ing    INCISION AND DRAINAGE  2004    lt thumb dog bite   INTRAMEDULLARY (IM) NAIL  INTERTROCHANTERIC  05/02/2012   Procedure: INTRAMEDULLARY (IM) NAIL INTERTROCHANTRIC;  Surgeon: Eulas Post, MD;  Location: [facility] OR;  Service: Orthopedics;  Laterality: Left;   ORIF FINGER FRACTURE  06/14/2011   Procedure: OPEN REDUCTION INTERNAL FIXATION (ORIF) METACARPAL (FINGER) FRACTURE;  Surgeon: Tami Ribas, MD;  Location: [facility];  Service: Orthopedics;  Laterality: Right;  right ring   POLYPECTOMY     TONSILLECTOMY     UPPER GASTROINTESTINAL ENDOSCOPY       Current Outpatient Medications:    alendronate (FOSAMAX) 70 MG tablet, Take 70 mg by mouth once a week., Disp: , Rfl:    celecoxib (CELEBREX) 100 MG capsule, Take 1 capsule (100 mg total) by mouth 2 (two) times daily as needed., Disp: 60 capsule, Rfl: 1   fluticasone (FLONASE) 50 MCG/ACT nasal spray, SPRAY 2 SPRAYS INTO EACH NOSTRIL EVERY DAY, Disp: 16 mL, Rfl: 6   hydrochlorothiazide (HYDRODIURIL) 25 MG tablet, Take 1 tablet (25 mg total) by mouth daily., Disp: 90 tablet, Rfl: 3   lisinopril (ZESTRIL) 40 MG tablet, Take 1 tablet (40 mg total) by mouth daily., Disp: 90 tablet, Rfl: 3   rosuvastatin (CRESTOR) 20 MG tablet, TAKE 1 TABLET BY MOUTH DAILY. GENERIC EQUIVALENT FOR CRESTOR. NEEDS AN APPOINTMENT, Disp: 90 tablet, Rfl: 1   tolterodine (DETROL LA)  4 MG 24 hr capsule, Take 1 capsule (4 mg total) by mouth daily., Disp: 30 capsule, Rfl: 10   traZODone (DESYREL) 50 MG tablet, TAKE 1/2 TO 1 TABLET BY MOUTH AT BEDTIME AS NEEDED FOR SLEEP, Disp: 90 tablet, Rfl: 0   VITAMIN D PO, Take 2,000 Units by mouth daily., Disp: , Rfl:    amoxicillin-clavulanate (AUGMENTIN) 875-125 MG tablet, Take 1 tablet by mouth 2 (two) times daily. (Patient not taking: Reported on 07/01/2023), Disp: 20 tablet, Rfl: 0  Current Facility-Administered Medications:    0.9 %  sodium chloride infusion, 500 mL, Intravenous, Once, Danis, Andreas Blower, MD:    No Known Allergies:  FH: Mother with history of non-Hodgkin's lymphoma.  Father  deceased non-Hodgkin's lymphoma.  Maternal grandmother sarcoma.  Paternal grandmother ovarian cancer.  Paternal grandfather pancreatic cancer.  SOCIAL HISTORY: She lives in [place].  Originally from [place].  She lives with her husband.  She has 2 adopted daughters.  She is a retired Health and safety inspector.  No tobacco use.  Social EtOH intake.  Review of Systems: No shortness of breath.  Mild dysphagia requiring a cough to clear for the past few months.  No chest pain.  No nausea or vomiting.  No change in bowel habits.  No bloody or black stools.  No urinary symptoms.  No numbness or tingling in the hands or feet.  Physical Exam:  Blood pressure 137/79, pulse (!) 145, temperature 97.9 F (36.6 C), temperature source Temporal, resp. rate 18, height 4\' 8"  (1.422 m), weight 162 lb 3.2 oz (73.6 kg), SpO2 100%.  Lungs: Lungs clear bilaterally. Cardiac: Regular, tachycardic. Abdomen: Abdomen soft and nontender.  No hepatosplenomegaly.  No mass. Vascular: No leg edema. Lymph nodes: No palpable cervical, supraclavicular, axillary or inguinal lymph nodes. Neurologic: Alert and oriented. Skin: No rash.  LABS:   Recent Labs    07/01/23 1406  WBC 12.5*  HGB 13.5  HCT 41.6  PLT 85*     Recent Labs    07/01/23 1406  NA 138  K 3.9  CL 102  CO2 25  GLUCOSE 154*  BUN 18  CREATININE 1.37*  CALCIUM 10.0  Peripheral blood smear-platelets mildly decreased, no platelet clumps; few teardrops, polychromasia not increased; majority of the white cells are mature appearing neutrophils, there is an increased population of atypical mononuclear cells with eccentric nucleoli, vacuoles and cytoplasmic granules  RADIOLOGY:  No results found.  Assessment and Plan:   Thrombocytopenia and monocytosis Tachycardia Hypertension Hypercholesterolemia  [name] was referred for evaluation of thrombocytopenia and monocytosis.  The peripheral blood smear is abnormal.  We are obtaining additional  laboratory evaluation including a myeloma panel and peripheral blood flow cytometry.  We referred her for an ultrasound to evaluate for splenomegaly.  EKG obtained in the office today due to tachycardia.  She is asymptomatic.  Dr. Casimiro Needle reviewed the EKG and is planning a referral to cardiology.  She will return for lab and follow-up in 2 to 3 weeks.  Patient seen with Dr. Truett Perna.    Lonna Cobb, NP 07/01/2023, 4:25 PM  This was a shared visit with Lonna Cobb.  [name] was interviewed and examined.  I reviewed the peripheral blood smear.  She is referred for evaluation of newly discovered leukocytosis and thrombocytopenia.  There is an absolute cytosis.  Reviewed the peripheral blood smear reveals an atypical population of mononuclear cells.  We obtained additional diagnostic evaluation today.  We discussed the potential need for a diagnostic bone  marrow biopsy.  The differential diagnosis includes CMML, MDS, and a low-grade leukemia or lymphoma.  She will return for an office visit and a repeat CBC in 2-3 weeks.  She had persistent tachycardia in the office today.  We will obtain an EKG and recommend follow-up with her primary provider.  She appears asymptomatic from the tachycardia.  I was present for greater than 50% of today's visit.  I performed medical decision making.  Mancel Bale, MD

## 2023-07-02 ENCOUNTER — Telehealth: Payer: Self-pay | Admitting: Nurse Practitioner

## 2023-07-02 LAB — SURGICAL PATHOLOGY

## 2023-07-02 LAB — KAPPA/LAMBDA LIGHT CHAINS
Kappa free light chain: 16.5 mg/L (ref 3.3–19.4)
Kappa, lambda light chain ratio: 1.72 — ABNORMAL HIGH (ref 0.26–1.65)
Lambda free light chains: 9.6 mg/L (ref 5.7–26.3)

## 2023-07-02 NOTE — Telephone Encounter (Signed)
Telephone call complete.

## 2023-07-03 DIAGNOSIS — M48061 Spinal stenosis, lumbar region without neurogenic claudication: Secondary | ICD-10-CM | POA: Diagnosis not present

## 2023-07-04 LAB — MULTIPLE MYELOMA PANEL, SERUM
Albumin SerPl Elph-Mcnc: 4.2 g/dL (ref 2.9–4.4)
Albumin/Glob SerPl: 1.7 (ref 0.7–1.7)
Alpha 1: 0.3 g/dL (ref 0.0–0.4)
Alpha2 Glob SerPl Elph-Mcnc: 0.8 g/dL (ref 0.4–1.0)
B-Globulin SerPl Elph-Mcnc: 1 g/dL (ref 0.7–1.3)
Gamma Glob SerPl Elph-Mcnc: 0.5 g/dL (ref 0.4–1.8)
Globulin, Total: 2.5 g/dL (ref 2.2–3.9)
IgA: 179 mg/dL (ref 87–352)
IgG (Immunoglobin G), Serum: 438 mg/dL — ABNORMAL LOW (ref 586–1602)
IgM (Immunoglobulin M), Srm: 133 mg/dL (ref 26–217)
Total Protein ELP: 6.7 g/dL (ref 6.0–8.5)

## 2023-07-05 ENCOUNTER — Telehealth: Payer: Self-pay | Admitting: Nurse Practitioner

## 2023-07-05 LAB — FLOW CYTOMETRY

## 2023-07-05 NOTE — Telephone Encounter (Signed)
I contacted [name] to review the flow cytometry peripheral blood findings---there is a monoclonal B cell population. This is a nonspecific finding. Plan is to follow with routine labs and proceed to a bone marrow biopsy for progressive lymphocytosis or thrombocytopenia. She will follow up as scheduled 07/17/2023.

## 2023-07-08 ENCOUNTER — Ambulatory Visit (HOSPITAL_BASED_OUTPATIENT_CLINIC_OR_DEPARTMENT_OTHER): Payer: Medicare Other

## 2023-07-09 ENCOUNTER — Ambulatory Visit (HOSPITAL_BASED_OUTPATIENT_CLINIC_OR_DEPARTMENT_OTHER): Admission: RE | Admit: 2023-07-09 | Payer: Medicare Other | Source: Ambulatory Visit

## 2023-07-15 ENCOUNTER — Ambulatory Visit (HOSPITAL_BASED_OUTPATIENT_CLINIC_OR_DEPARTMENT_OTHER)
Admission: RE | Admit: 2023-07-15 | Discharge: 2023-07-15 | Disposition: A | Discharge status: Home / Self Care | Payer: Medicare Other | Source: Ambulatory Visit | Attending: Nurse Practitioner | Admitting: Nurse Practitioner

## 2023-07-15 DIAGNOSIS — D696 Thrombocytopenia, unspecified: Secondary | ICD-10-CM | POA: Insufficient documentation

## 2023-07-15 DIAGNOSIS — R000 Tachycardia, unspecified: Secondary | ICD-10-CM | POA: Insufficient documentation

## 2023-07-15 DIAGNOSIS — D72821 Monocytosis (symptomatic): Secondary | ICD-10-CM | POA: Insufficient documentation

## 2023-07-17 ENCOUNTER — Inpatient Hospital Stay: Payer: Medicare Other | Admitting: Nurse Practitioner

## 2023-07-17 ENCOUNTER — Encounter: Payer: Self-pay | Admitting: Nurse Practitioner

## 2023-07-17 ENCOUNTER — Inpatient Hospital Stay: Payer: Medicare Other

## 2023-07-17 VITALS — BP 126/69 | HR 120 | Temp 98.1°F | Resp 18 | Ht <= 58 in | Wt 161.9 lb

## 2023-07-17 DIAGNOSIS — D696 Thrombocytopenia, unspecified: Secondary | ICD-10-CM

## 2023-07-17 DIAGNOSIS — D72821 Monocytosis (symptomatic): Secondary | ICD-10-CM

## 2023-07-17 DIAGNOSIS — I10 Essential (primary) hypertension: Secondary | ICD-10-CM | POA: Diagnosis not present

## 2023-07-17 DIAGNOSIS — R000 Tachycardia, unspecified: Secondary | ICD-10-CM

## 2023-07-17 DIAGNOSIS — I493 Ventricular premature depolarization: Secondary | ICD-10-CM | POA: Diagnosis not present

## 2023-07-17 DIAGNOSIS — E7800 Pure hypercholesterolemia, unspecified: Secondary | ICD-10-CM | POA: Diagnosis not present

## 2023-07-17 LAB — CBC WITH DIFFERENTIAL (CANCER CENTER ONLY)
Abs Immature Granulocytes: 0.37 10*3/uL — ABNORMAL HIGH (ref 0.00–0.07)
Basophils Absolute: 0.1 10*3/uL (ref 0.0–0.1)
Basophils Relative: 0 %
Eosinophils Absolute: 0.1 10*3/uL (ref 0.0–0.5)
Eosinophils Relative: 1 %
HCT: 39 % (ref 36.0–46.0)
Hemoglobin: 12.5 g/dL (ref 12.0–15.0)
Immature Granulocytes: 3 %
Lymphocytes Relative: 20 %
Lymphs Abs: 2.7 10*3/uL (ref 0.7–4.0)
MCH: 28.3 pg (ref 26.0–34.0)
MCHC: 32.1 g/dL (ref 30.0–36.0)
MCV: 88.4 fL (ref 80.0–100.0)
Monocytes Absolute: 2.6 10*3/uL — ABNORMAL HIGH (ref 0.1–1.0)
Monocytes Relative: 19 %
Neutro Abs: 7.6 10*3/uL (ref 1.7–7.7)
Neutrophils Relative %: 57 %
Platelet Count: 76 10*3/uL — ABNORMAL LOW (ref 150–400)
RBC: 4.41 MIL/uL (ref 3.87–5.11)
RDW: 13.3 % (ref 11.5–15.5)
WBC Count: 13.4 10*3/uL — ABNORMAL HIGH (ref 4.0–10.5)
nRBC: 0 % (ref 0.0–0.2)

## 2023-07-17 LAB — CMP (CANCER CENTER ONLY)
ALT: 24 U/L (ref 0–44)
AST: 23 U/L (ref 15–41)
Albumin: 4.7 g/dL (ref 3.5–5.0)
Alkaline Phosphatase: 49 U/L (ref 38–126)
Anion gap: 9 (ref 5–15)
BUN: 21 mg/dL (ref 8–23)
CO2: 24 mmol/L (ref 22–32)
Calcium: 9.7 mg/dL (ref 8.9–10.3)
Chloride: 104 mmol/L (ref 98–111)
Creatinine: 0.84 mg/dL (ref 0.44–1.00)
GFR, Estimated: >60 mL/min (ref >60)
Glucose, Bld: 170 mg/dL — ABNORMAL HIGH (ref 70–99)
Potassium: 3.8 mmol/L (ref 3.5–5.1)
Sodium: 137 mmol/L (ref 135–145)
Total Bilirubin: 1 mg/dL (ref 0.0–1.2)
Total Protein: 6.9 g/dL (ref 6.5–8.1)

## 2023-07-17 NOTE — Progress Notes (Signed)
  [facility] OFFICE PROGRESS NOTE   Diagnosis: Thrombocytopenia, monocytosis  INTERVAL HISTORY:   [name] returns as scheduled.  No fevers or sweats.  Appetite is decreased a little.  Weight is stable.  Objective:  Vital signs in last 24 hours:  Blood pressure 126/69, pulse (!) 120, temperature 98.1 F (36.7 C), temperature source Temporal, resp. rate 18, height 4\' 8"  (1.422 m), weight 161 lb 14.4 oz (73.4 kg), SpO2 98%.    Lymphatics: No palpable cervical, supraclavicular or axillary lymph nodes. Resp: Lungs clear bilaterally. Cardio: Regular rate and rhythm. GI: No hepatosplenomegaly. Vascular: No leg edema.   Lab Results:  Lab Results  Component Value Date   WBC 13.4 (H) 07/17/2023   HGB 12.5 07/17/2023   HCT 39.0 07/17/2023   MCV 88.4 07/17/2023   PLT 76 (L) 07/17/2023   NEUTROABS 7.6 07/17/2023    Imaging:  No results found.  Medications: I have reviewed the patient's current medications.  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 07/15/2023 abdominal ultrasound-unremarkable ultrasound of the spleen. Tachycardia Hypertension Hypercholesterolemia  Disposition: [name] appears stable.  We reviewed the peripheral blood flow cytometry finding of a monoclonal B-cell population  with her and her daughter at today's visit.  She agrees to proceeding with a bone marrow biopsy.  We will try to get this arranged in the next 1 to 2 weeks.  She will return for a follow-up visit in 3 weeks.  We are available to see her sooner if needed.  Patient seen with Dr.  Truett Perna.  Lonna Cobb ANP/GNP-BC   07/17/2023  11:28 AM  This was a shared visit with Lonna Cobb.  [name] has persistent thrombocytopenia and mild leukocytosis.  Flow cytometry of the peripheral blood 07/01/2023 revealed a kappa restricted B-cell population.  We discussed the flow cytometry findings and differential diagnosis with [name] and her daughter.  I recommend proceeding with a bone marrow biopsy and attempt to establish a more definitive diagnosis.  She may have a low-grade B-cell lymphoma/leukemia, or ITP.  She agrees to proceed. I was present for greater than 50% of today's visit.  I performed medical decision making.  Mancel Bale, MD

## 2023-08-10 ENCOUNTER — Other Ambulatory Visit: Payer: Self-pay | Admitting: Family Medicine

## 2023-08-10 DIAGNOSIS — I10 Essential (primary) hypertension: Secondary | ICD-10-CM

## 2023-08-12 ENCOUNTER — Other Ambulatory Visit: Payer: Self-pay | Admitting: Radiology

## 2023-08-12 DIAGNOSIS — D696 Thrombocytopenia, unspecified: Secondary | ICD-10-CM

## 2023-08-12 NOTE — H&P (Signed)
 Chief Complaint: Persistent thrombocytopenia, mild leukocytosis, flow cytometry of the peripheral blood 07/01/2023 revealing a kappa restricted B-cell population ; referred for image guided bone marrow biopsy for further evaluation  Referring Provider(s): Sherrill,B  Supervising Physician: Roanna Banning  Patient Status: [facility] - Out-pt  History of Present Illness: [name] is a 68 y.o. female with PMH sig for diverticulosis, HLD, HTN, IBD, Turner's syndrome, GERD, left femur fx 2013, hearing loss, ulcerative colitis, vit B12 def who presents now with persistent thrombocytopenia, mild leukocytosis and flow cytometry of the peripheral blood 07/01/2023 revealing a kappa restricted B-cell population. She is scheduled today for CT guided bone marrow biopsy for further evaluation.   *** Patient is Full Code  Past Medical History:  Diagnosis Date   B12 DEFICIENCY 02/09/2009   Qualifier: Diagnosis of  By: Yetta Barre RN, CGRN, Sheri     Blood in stool    COLONIC POLYPS, HYPERPLASTIC, HX OF 02/03/2009   Qualifier: Diagnosis of  By: Surface RN, Delle Reining, COLON 02/03/2009   Qualifier: Diagnosis of  By: Surface RN, Donna     Dyslipidemia 06/20/2012   Fracture, intertrochanteric, left femur (HCC) 05/02/2012   GERD (gastroesophageal reflux disease)    Hearing loss    Bil/has hearing aids   Hemorrhoids    HEMORRHOIDS 02/03/2009   Qualifier: Diagnosis of  By: Surface RN, Lupita Leash     Hypertension    INFLAMMATORY BOWEL DISEASE - Followed by Dr. Jarold Motto in GI 02/03/2009   Qualifier: Diagnosis of  By: Surface RN, Glorianne Manchester    Turner's syndrome    was on Provera and Premarin and d/c this  at age 38yr   Ulcerative colitis     Past Surgical History:  Procedure Laterality Date   COLONOSCOPY  last 03/25/2013   HERNIA REPAIR  2009   lt ing    INCISION AND DRAINAGE  2004    lt thumb dog bite   INTRAMEDULLARY (IM) NAIL INTERTROCHANTERIC  05/02/2012   Procedure: INTRAMEDULLARY  (IM) NAIL INTERTROCHANTRIC;  Surgeon: Eulas Post, MD;  Location: [facility] OR;  Service: Orthopedics;  Laterality: Left;   ORIF FINGER FRACTURE  06/14/2011   Procedure: OPEN REDUCTION INTERNAL FIXATION (ORIF) METACARPAL (FINGER) FRACTURE;  Surgeon: Tami Ribas, MD;  Location: [facility];  Service: Orthopedics;  Laterality: Right;  right ring   POLYPECTOMY     TONSILLECTOMY     UPPER GASTROINTESTINAL ENDOSCOPY      Allergies: Patient has no known allergies.  Medications: Prior to Admission medications   Medication Sig Start Date End Date Taking? Authorizing Provider  alendronate (FOSAMAX) 70 MG tablet Take 70 mg by mouth once a week. 08/21/21   [provider]  celecoxib (CELEBREX) 100 MG capsule Take 1 capsule (100 mg total) by mouth 2 (two) times daily as needed. 07/07/21   Wynn Banker, MD  fluticasone (FLONASE) 50 MCG/ACT nasal spray SPRAY 2 SPRAYS INTO EACH NOSTRIL EVERY DAY 05/16/23   Karie Georges, MD  hydrochlorothiazide (HYDRODIURIL) 25 MG tablet TAKE 1 TABLET (25 MG TOTAL) BY MOUTH DAILY. 08/12/23   Karie Georges, MD  lisinopril (ZESTRIL) 40 MG tablet TAKE 1 TABLET BY MOUTH EVERY DAY 08/12/23   Karie Georges, MD  rosuvastatin (CRESTOR) 20 MG tablet TAKE 1 TABLET BY MOUTH DAILY. GENERIC EQUIVALENT FOR CRESTOR. NEEDS AN APPOINTMENT 03/18/23   Karie Georges, MD  tolterodine (DETROL LA) 4 MG 24 hr capsule Take 1  capsule (4 mg total) by mouth daily. 08/04/21   Wynn Banker, MD  traZODone (DESYREL) 50 MG tablet TAKE 1/2 TO 1 TABLET BY MOUTH AT BEDTIME AS NEEDED FOR SLEEP 12/13/22   Karie Georges, MD  VITAMIN D PO Take 2,000 Units by mouth daily.    [provider]     Family History  Problem Relation Age of Onset   Lymphoma Mother        nhl-mantle cell   Lymphoma Father        nhl   Healthy Sister    Melanoma Maternal Grandmother    Heart disease Maternal Grandfather    Ovarian cancer Paternal Grandmother     Pancreatic cancer Paternal Grandfather    Colon cancer Neg Hx    Esophageal cancer Neg Hx    Stomach cancer Neg Hx    Rectal cancer Neg Hx    Breast cancer Neg Hx    Colon polyps Neg Hx     Social History   Socioeconomic History   Marital status: Married    Spouse name: Not on file   Number of children: Not on file   Years of education: Not on file   Highest education level: Not on file  Occupational History   Not on file  Tobacco Use   Smoking status: Never   Smokeless tobacco: Never  Vaping Use   Vaping status: Never Used  Substance and Sexual Activity   Alcohol use: Yes    Alcohol/week: 1.0 standard drink of alcohol    Types: 1 Glasses of wine per week    Comment: occ.   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   Married and lives in [place] with 2 daughters    Social Drivers of Corporate investment banker Strain: Low Risk  (07/01/2023)   Overall Financial Resource Strain (CARDIA)    Difficulty of Paying Living Expenses: Not hard at all  Food Insecurity: No Food Insecurity (07/01/2023)   Hunger Vital Sign    Worried About Running Out of Food in the Last Year: Never true    Ran Out of Food in the Last Year: Never true  Transportation Needs: No Transportation Needs (07/01/2023)   PRAPARE - Administrator, Civil Service (Medical): No    Lack of Transportation (Non-Medical): No  Physical Activity: Inactive (07/01/2023)   Exercise Vital Sign    Days of Exercise per Week: 0 days    Minutes of Exercise per Session: 0 min  Stress: No Stress Concern Present (07/01/2023)   [employer] of Occupational Health - Occupational Stress Questionnaire    Feeling of Stress : Not at all  Social Connections: Socially Integrated (07/01/2023)   Social Connection and Isolation Panel [NHANES]    Frequency of  Communication with Friends and Family: Three times a week    Frequency of Social Gatherings with Friends and Family: Three times a week    Attends Religious Services: More than 4 times per year    Active Member of Clubs or Organizations: Yes    Attends Banker Meetings: More than 4 times per year    Marital Status: Married       Review of Systems  Vital Signs:  Advance Care Plan: no documents on file  Physical Exam  Imaging: US SPLEEN (ABDOMEN LIMITED) Result Date: 07/15/2023 : PROCEDURE: ULTRASOUND ABDOMEN LIMITED HISTORY: Patient is a 68 y/o F with thrombocytopenia, question splenomegaly. COMPARISON: None available. TECHNIQUE: Two-dimensional grayscale and color Doppler ultrasound of the spleen was performed. FINDINGS: The spleen measures 9.5 cm in length. It demonstrates a normal echotexture. IMPRESSION: 1. Unremarkable ultrasound of the spleen. Thank you for allowing [place] to assist in the care of this patient. Electronically Signed   By: Lestine Box M.D.   On: 07/15/2023 22:02    Labs:  CBC: Recent Labs    06/25/23 1356 07/01/23 1406 07/17/23 1044  WBC 11.1* 12.5* 13.4*  HGB 13.2 13.5 12.5  HCT 40.8 41.6 39.0  PLT 76.0* 85* 76*    COAGS: No results for input(s): "INR", "APTT" in the last 8760 hours.  BMP: Recent Labs    11/19/22 0905 07/01/23 1406 07/17/23 1044  NA 140 138 137  K 4.0 3.9 3.8  CL 102 102 104  CO2 26 25 24   GLUCOSE 94 154* 170*  BUN 27* 18 21  CALCIUM 10.2 10.0 9.7  CREATININE 0.92 1.37* 0.84  GFRNONAA  --  43* >60    LIVER FUNCTION TESTS: Recent Labs    11/19/22 0905 07/01/23 1406 07/17/23 1044  BILITOT 0.7 1.0 1.0  AST 26 20 23   ALT 26 17 24   ALKPHOS 66 50 49  PROT 7.2 7.4 6.9  ALBUMIN 4.7 4.7 4.7    TUMOR MARKERS: No results for input(s): "AFPTM", "CEA", "CA199", "CHROMGRNA" in the last 8760 hours.  Assessment and Plan: 68 y.o. female with PMH sig for diverticulosis, HLD, HTN, IBD, Turner's syndrome, GERD,  left femur fx 2013, hearing loss, ulcerative colitis, vit B12 def who presents now with persistent thrombocytopenia, mild leukocytosis and flow cytometry of the peripheral blood 07/01/2023 revealing a kappa restricted B-cell population. She is scheduled today for CT guided bone marrow biopsy for further evaluation.Risks and benefits of procedure was discussed with the patient  including, but not limited to bleeding, infection, damage to adjacent structures or low yield requiring additional tests.  All of the questions were answered and there is agreement to proceed.  Consent signed and in chart.    Thank you for allowing our service to participate in Wednesday Ericsson 's care.  Electronically Signed: D. Jeananne Rama, PA-C   08/12/2023, 12:56 PM      I spent a total of  15 Minutes   in face to face in clinical consultation, greater than 50% of which was counseling/coordinating care for CT guided bone marrow biopsy

## 2023-08-13 ENCOUNTER — Ambulatory Visit (HOSPITAL_COMMUNITY)
Admission: RE | Admit: 2023-08-13 | Discharge: 2023-08-13 | Disposition: A | Discharge status: Home / Self Care | Payer: Medicare Other | Source: Ambulatory Visit | Attending: Nurse Practitioner | Admitting: Nurse Practitioner

## 2023-08-13 ENCOUNTER — Encounter (HOSPITAL_COMMUNITY): Payer: Self-pay

## 2023-08-13 ENCOUNTER — Ambulatory Visit (HOSPITAL_COMMUNITY)
Admission: RE | Admit: 2023-08-13 | Discharge: 2023-08-13 | Disposition: A | Discharge status: Home / Self Care | Payer: Medicare Other | Source: Ambulatory Visit | Attending: Nurse Practitioner

## 2023-08-13 DIAGNOSIS — K5190 Ulcerative colitis, unspecified, without complications: Secondary | ICD-10-CM | POA: Diagnosis not present

## 2023-08-13 DIAGNOSIS — D696 Thrombocytopenia, unspecified: Secondary | ICD-10-CM | POA: Diagnosis not present

## 2023-08-13 DIAGNOSIS — D7589 Other specified diseases of blood and blood-forming organs: Secondary | ICD-10-CM | POA: Insufficient documentation

## 2023-08-13 DIAGNOSIS — Q969 Turner's syndrome, unspecified: Secondary | ICD-10-CM | POA: Diagnosis not present

## 2023-08-13 DIAGNOSIS — I10 Essential (primary) hypertension: Secondary | ICD-10-CM | POA: Diagnosis not present

## 2023-08-13 DIAGNOSIS — K589 Irritable bowel syndrome without diarrhea: Secondary | ICD-10-CM | POA: Insufficient documentation

## 2023-08-13 DIAGNOSIS — D72821 Monocytosis (symptomatic): Secondary | ICD-10-CM | POA: Diagnosis not present

## 2023-08-13 DIAGNOSIS — Z1379 Encounter for other screening for genetic and chromosomal anomalies: Secondary | ICD-10-CM | POA: Diagnosis not present

## 2023-08-13 DIAGNOSIS — E785 Hyperlipidemia, unspecified: Secondary | ICD-10-CM | POA: Diagnosis not present

## 2023-08-13 DIAGNOSIS — H9190 Unspecified hearing loss, unspecified ear: Secondary | ICD-10-CM | POA: Diagnosis not present

## 2023-08-13 DIAGNOSIS — D72829 Elevated white blood cell count, unspecified: Secondary | ICD-10-CM | POA: Diagnosis not present

## 2023-08-13 LAB — CBC WITH DIFFERENTIAL/PLATELET
Abs Immature Granulocytes: 0.41 10*3/uL — ABNORMAL HIGH (ref 0.00–0.07)
Basophils Absolute: 0 10*3/uL (ref 0.0–0.1)
Basophils Relative: 0 %
Eosinophils Absolute: 0.1 10*3/uL (ref 0.0–0.5)
Eosinophils Relative: 1 %
HCT: 40.5 % (ref 36.0–46.0)
Hemoglobin: 12.5 g/dL (ref 12.0–15.0)
Immature Granulocytes: 4 %
Lymphocytes Relative: 25 %
Lymphs Abs: 2.9 10*3/uL (ref 0.7–4.0)
MCH: 28.2 pg (ref 26.0–34.0)
MCHC: 30.9 g/dL (ref 30.0–36.0)
MCV: 91.4 fL (ref 80.0–100.0)
Monocytes Absolute: 2.7 10*3/uL — ABNORMAL HIGH (ref 0.1–1.0)
Monocytes Relative: 23 %
Neutro Abs: 5.5 10*3/uL (ref 1.7–7.7)
Neutrophils Relative %: 47 %
Platelets: 70 10*3/uL — ABNORMAL LOW (ref 150–400)
RBC: 4.43 MIL/uL (ref 3.87–5.11)
RDW: 13.5 % (ref 11.5–15.5)
WBC: 11.5 10*3/uL — ABNORMAL HIGH (ref 4.0–10.5)
nRBC: 0 % (ref 0.0–0.2)

## 2023-08-13 MED ORDER — FENTANYL CITRATE (PF) 100 MCG/2ML IJ SOLN
INTRAMUSCULAR | Status: AC | PRN
  Administered 2023-08-13: 50 ug via INTRAVENOUS

## 2023-08-13 MED ORDER — MIDAZOLAM HCL 2 MG/2ML IJ SOLN
INTRAMUSCULAR | Status: AC
  Filled 2023-08-13: qty 4

## 2023-08-13 MED ORDER — MIDAZOLAM HCL 2 MG/2ML IJ SOLN
INTRAMUSCULAR | Status: AC | PRN
  Administered 2023-08-13: 1 mg via INTRAVENOUS

## 2023-08-13 MED ORDER — FLUMAZENIL 0.5 MG/5ML IV SOLN
INTRAVENOUS | Status: AC
  Filled 2023-08-13: qty 5

## 2023-08-13 MED ORDER — SODIUM CHLORIDE 0.9 % IV SOLN
INTRAVENOUS | Status: DC
Start: 2023-08-13 — End: 2023-08-14

## 2023-08-13 MED ORDER — LIDOCAINE HCL 1 % IJ SOLN
INTRAMUSCULAR | Status: AC | PRN
  Administered 2023-08-13: 10 mL via INTRADERMAL

## 2023-08-13 MED ORDER — NALOXONE HCL 0.4 MG/ML IJ SOLN
INTRAMUSCULAR | Status: AC
  Filled 2023-08-13: qty 1

## 2023-08-13 MED ORDER — FENTANYL CITRATE (PF) 100 MCG/2ML IJ SOLN
INTRAMUSCULAR | Status: AC
  Filled 2023-08-13: qty 2

## 2023-08-13 NOTE — Procedures (Signed)
 Vascular and Interventional Radiology Procedure Note  Patient: [name] DOB: 1955/11/06 Medical Record Number: [id number] Note Date/Time: 08/13/23 8:59 AM   Performing Physician: Roanna Banning, MD Assistant(s): None  Diagnosis: Leukocytosis. Q B cell Lymphoma   Procedure: BONE MARROW ASPIRATION and BIOPSY  Anesthesia: Conscious Sedation Complications: None Estimated Blood Loss: Minimal Specimens: Sent for Pathology  Findings:  Successful CT-guided bone marrow aspiration and biopsy A total of 1 cores were obtained. Hemostasis of the tract was achieved using Manual Pressure.  Plan: Bed rest for 1 hours.  See detailed procedure note with images in PACS. The patient tolerated the procedure well without incident or complication and was returned to Recovery in stable condition.    Roanna Banning, MD [facility] Radiology   Pager. [phone] Clinic. [phone]

## 2023-08-15 ENCOUNTER — Other Ambulatory Visit: Payer: Medicare Other

## 2023-08-15 ENCOUNTER — Ambulatory Visit: Payer: Medicare Other | Admitting: Oncology

## 2023-08-15 LAB — SURGICAL PATHOLOGY

## 2023-08-20 ENCOUNTER — Encounter (HOSPITAL_COMMUNITY): Payer: Self-pay | Admitting: Nurse Practitioner

## 2023-08-22 ENCOUNTER — Inpatient Hospital Stay: Payer: Medicare Other | Admitting: Oncology

## 2023-08-22 ENCOUNTER — Inpatient Hospital Stay: Payer: Medicare Other | Attending: Nurse Practitioner

## 2023-08-22 VITALS — BP 132/78 | HR 100 | Temp 98.1°F | Resp 18 | Ht <= 58 in | Wt 164.0 lb

## 2023-08-22 DIAGNOSIS — I10 Essential (primary) hypertension: Secondary | ICD-10-CM | POA: Diagnosis not present

## 2023-08-22 DIAGNOSIS — D696 Thrombocytopenia, unspecified: Secondary | ICD-10-CM | POA: Insufficient documentation

## 2023-08-22 DIAGNOSIS — E7800 Pure hypercholesterolemia, unspecified: Secondary | ICD-10-CM | POA: Diagnosis not present

## 2023-08-22 DIAGNOSIS — D72821 Monocytosis (symptomatic): Secondary | ICD-10-CM | POA: Insufficient documentation

## 2023-08-22 DIAGNOSIS — M79605 Pain in left leg: Secondary | ICD-10-CM | POA: Diagnosis not present

## 2023-08-22 DIAGNOSIS — G8929 Other chronic pain: Secondary | ICD-10-CM | POA: Diagnosis not present

## 2023-08-22 DIAGNOSIS — M545 Low back pain, unspecified: Secondary | ICD-10-CM | POA: Insufficient documentation

## 2023-08-22 LAB — CBC WITH DIFFERENTIAL (CANCER CENTER ONLY)
Abs Immature Granulocytes: 0.55 10*3/uL — ABNORMAL HIGH (ref 0.00–0.07)
Basophils Absolute: 0 10*3/uL (ref 0.0–0.1)
Basophils Relative: 0 %
Eosinophils Absolute: 0.1 10*3/uL (ref 0.0–0.5)
Eosinophils Relative: 1 %
HCT: 38.7 % (ref 36.0–46.0)
Hemoglobin: 12.2 g/dL (ref 12.0–15.0)
Immature Granulocytes: 5 %
Lymphocytes Relative: 25 %
Lymphs Abs: 3 10*3/uL (ref 0.7–4.0)
MCH: 28.2 pg (ref 26.0–34.0)
MCHC: 31.5 g/dL (ref 30.0–36.0)
MCV: 89.4 fL (ref 80.0–100.0)
Monocytes Absolute: 2.7 10*3/uL — ABNORMAL HIGH (ref 0.1–1.0)
Monocytes Relative: 23 %
Neutro Abs: 5.6 10*3/uL (ref 1.7–7.7)
Neutrophils Relative %: 46 %
Platelet Count: 76 10*3/uL — ABNORMAL LOW (ref 150–400)
RBC: 4.33 MIL/uL (ref 3.87–5.11)
RDW: 13.7 % (ref 11.5–15.5)
WBC Count: 12 10*3/uL — ABNORMAL HIGH (ref 4.0–10.5)
nRBC: 0 % (ref 0.0–0.2)

## 2023-08-22 NOTE — Progress Notes (Signed)
  [facility] OFFICE PROGRESS NOTE   [name]: Thrombocytopenia, monocytosis  INTERVAL HISTORY:   [name] returns as scheduled.  She underwent a diagnostic bone marrow biopsy on 08/13/2023.  She reports tolerating the procedure well.  She continues to have pain at the left lower back and leg.  She is being evaluated by orthopedics.  She reports the pain has been present for years.  An MRI has been ordered.  No fever or night sweats.  She bruises easily.  No other bleeding.  Objective:  Vital signs in last 24 hours:  Blood pressure 132/78, pulse 100, temperature 98.1 F (36.7 C), temperature source Temporal, resp. rate 18, height 4\' 9"  (1.448 m), weight 164 lb (74.4 kg), SpO2 98%.    Lymphatics: No cervical, supraclavicular, axillary, or inguinal nodes Resp: Lungs clear bilaterally Cardio: Regular rate and rhythm GI: No hepatosplenomegaly Vascular: No leg edema Skin: Bone marrow biopsy site with a small ecchymosis, small ecchymosis at the right leg.   Lab Results:  Lab Results  Component Value Date   WBC 12.0 (H) 08/22/2023   HGB 12.2 08/22/2023   HCT 38.7 08/22/2023   MCV 89.4 08/22/2023   PLT 76 (L) 08/22/2023   NEUTROABS 5.6 08/22/2023    CMP  Lab Results  Component Value Date   NA 137 07/17/2023   K 3.8 07/17/2023   CL 104 07/17/2023   CO2 24 07/17/2023   GLUCOSE 170 (H) 07/17/2023   BUN 21 07/17/2023   CREATININE 0.84 07/17/2023   CALCIUM 9.7 07/17/2023   PROT 6.9 07/17/2023   ALBUMIN 4.7 07/17/2023   AST 23 07/17/2023   ALT 24 07/17/2023   ALKPHOS 49 07/17/2023   BILITOT 1.0 07/17/2023   GFRNONAA >60 07/17/2023   GFRAA >90 05/07/2012     Medications: I have reviewed the patient's current medications.   Assessment/Plan:  Thrombocytopenia and monocytosis 07/01/2023 myeloma panel-IgG mildly decreased at 438, IgA and IgM normal, no M spike, immunofixation pattern unremarkable, evidence of monoclonal protein not apparent; serum free  kappa and lambda light chains in normal range, ratio 1.72 (mildly elevated) 07/01/2023 peripheral blood flow cytometry-kappa restricted B-cell lymphoproliferative disorder with a nonspecific immunophenotype. 07/15/2023 abdominal ultrasound-unremarkable ultrasound of the spleen. Bone marrow biopsy 08/13/2023: Mildly hypercellular marrow, no increase in blast, reactive lymphoid aggregates, flow cytometry without a clonal B-cell population, cytogenetics-45, X,-X History of tachycardia Hypertension Hypercholesterolemia Chronic low back/left leg pain   Disposition:  [name] has persistent thrombocytopenia and monocytosis.  The etiology of the hematologic findings is unclear.  A bone marrow biopsy was nondiagnostic for a lymphoproliferative disorder and myelodysplasia.  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  The most recent normal CBC was in June 2023.  The plan is to continue observation.  She will return for an office visit and CBC in 3 months.  She will call in the interim for new symptoms.  She will specifically call for spontaneous bleeding or bruising. Thornton Papas, MD  08/22/2023  9:53 AM

## 2023-08-24 ENCOUNTER — Telehealth: Payer: Self-pay | Admitting: Oncology

## 2023-08-24 NOTE — Telephone Encounter (Signed)
 Patient has been scheduled for follow-up visit per 08/22/23 LOS.  Pt aware of scheduled appt details.

## 2023-08-30 DIAGNOSIS — M48061 Spinal stenosis, lumbar region without neurogenic claudication: Secondary | ICD-10-CM | POA: Diagnosis not present

## 2023-09-09 DIAGNOSIS — M545 Low back pain, unspecified: Secondary | ICD-10-CM | POA: Diagnosis not present

## 2023-09-30 DIAGNOSIS — H52223 Regular astigmatism, bilateral: Secondary | ICD-10-CM | POA: Diagnosis not present

## 2023-09-30 DIAGNOSIS — M545 Low back pain, unspecified: Secondary | ICD-10-CM | POA: Diagnosis not present

## 2023-09-30 DIAGNOSIS — Z961 Presence of intraocular lens: Secondary | ICD-10-CM | POA: Diagnosis not present

## 2023-10-03 ENCOUNTER — Encounter (INDEPENDENT_AMBULATORY_CARE_PROVIDER_SITE_OTHER): Admitting: Family Medicine

## 2023-10-03 NOTE — Progress Notes (Signed)
 error

## 2023-10-11 DIAGNOSIS — M25552 Pain in left hip: Secondary | ICD-10-CM | POA: Diagnosis not present

## 2023-10-18 DIAGNOSIS — M25552 Pain in left hip: Secondary | ICD-10-CM | POA: Diagnosis not present

## 2023-10-19 ENCOUNTER — Emergency Department (HOSPITAL_COMMUNITY)

## 2023-10-19 ENCOUNTER — Inpatient Hospital Stay (HOSPITAL_COMMUNITY)
Admission: EM | Admit: 2023-10-19 | Discharge: 2023-10-23 | DRG: 683 | Disposition: A | Discharge status: Home Health | Attending: Internal Medicine | Admitting: Internal Medicine

## 2023-10-19 ENCOUNTER — Encounter (HOSPITAL_COMMUNITY): Payer: Self-pay

## 2023-10-19 ENCOUNTER — Other Ambulatory Visit: Payer: Self-pay

## 2023-10-19 ENCOUNTER — Emergency Department (HOSPITAL_BASED_OUTPATIENT_CLINIC_OR_DEPARTMENT_OTHER)
Admission: EM | Admit: 2023-10-19 | Discharge: 2023-10-19 | Disposition: A | Discharge status: Other Acute Inpatient Hospital | Source: Home / Self Care

## 2023-10-19 DIAGNOSIS — Z8041 Family history of malignant neoplasm of ovary: Secondary | ICD-10-CM

## 2023-10-19 DIAGNOSIS — Z807 Family history of other malignant neoplasms of lymphoid, hematopoietic and related tissues: Secondary | ICD-10-CM

## 2023-10-19 DIAGNOSIS — M48061 Spinal stenosis, lumbar region without neurogenic claudication: Secondary | ICD-10-CM | POA: Diagnosis not present

## 2023-10-19 DIAGNOSIS — D696 Thrombocytopenia, unspecified: Secondary | ICD-10-CM

## 2023-10-19 DIAGNOSIS — R7989 Other specified abnormal findings of blood chemistry: Secondary | ICD-10-CM | POA: Diagnosis not present

## 2023-10-19 DIAGNOSIS — R531 Weakness: Secondary | ICD-10-CM | POA: Diagnosis not present

## 2023-10-19 DIAGNOSIS — E871 Hypo-osmolality and hyponatremia: Secondary | ICD-10-CM | POA: Diagnosis not present

## 2023-10-19 DIAGNOSIS — D693 Immune thrombocytopenic purpura: Principal | ICD-10-CM | POA: Diagnosis present

## 2023-10-19 DIAGNOSIS — E861 Hypovolemia: Secondary | ICD-10-CM | POA: Diagnosis not present

## 2023-10-19 DIAGNOSIS — N179 Acute kidney failure, unspecified: Secondary | ICD-10-CM | POA: Diagnosis not present

## 2023-10-19 DIAGNOSIS — M4855XA Collapsed vertebra, not elsewhere classified, thoracolumbar region, initial encounter for fracture: Secondary | ICD-10-CM | POA: Diagnosis not present

## 2023-10-19 DIAGNOSIS — Z8249 Family history of ischemic heart disease and other diseases of the circulatory system: Secondary | ICD-10-CM | POA: Diagnosis not present

## 2023-10-19 DIAGNOSIS — H9193 Unspecified hearing loss, bilateral: Secondary | ICD-10-CM | POA: Diagnosis not present

## 2023-10-19 DIAGNOSIS — M5136 Other intervertebral disc degeneration, lumbar region with discogenic back pain only: Secondary | ICD-10-CM | POA: Diagnosis not present

## 2023-10-19 DIAGNOSIS — E8721 Acute metabolic acidosis: Secondary | ICD-10-CM | POA: Diagnosis not present

## 2023-10-19 DIAGNOSIS — Z808 Family history of malignant neoplasm of other organs or systems: Secondary | ICD-10-CM | POA: Diagnosis not present

## 2023-10-19 DIAGNOSIS — Z79899 Other long term (current) drug therapy: Secondary | ICD-10-CM | POA: Diagnosis not present

## 2023-10-19 DIAGNOSIS — Q969 Turner's syndrome, unspecified: Secondary | ICD-10-CM

## 2023-10-19 DIAGNOSIS — E860 Dehydration: Secondary | ICD-10-CM | POA: Diagnosis present

## 2023-10-19 DIAGNOSIS — D72829 Elevated white blood cell count, unspecified: Secondary | ICD-10-CM

## 2023-10-19 DIAGNOSIS — M51379 Other intervertebral disc degeneration, lumbosacral region without mention of lumbar back pain or lower extremity pain: Secondary | ICD-10-CM | POA: Diagnosis not present

## 2023-10-19 DIAGNOSIS — E538 Deficiency of other specified B group vitamins: Secondary | ICD-10-CM | POA: Diagnosis present

## 2023-10-19 DIAGNOSIS — I517 Cardiomegaly: Secondary | ICD-10-CM | POA: Diagnosis not present

## 2023-10-19 DIAGNOSIS — R109 Unspecified abdominal pain: Secondary | ICD-10-CM | POA: Diagnosis not present

## 2023-10-19 DIAGNOSIS — Z800 Family history of malignant neoplasm of digestive organs: Secondary | ICD-10-CM | POA: Diagnosis not present

## 2023-10-19 DIAGNOSIS — G8929 Other chronic pain: Secondary | ICD-10-CM | POA: Diagnosis present

## 2023-10-19 DIAGNOSIS — K219 Gastro-esophageal reflux disease without esophagitis: Secondary | ICD-10-CM | POA: Diagnosis not present

## 2023-10-19 DIAGNOSIS — E785 Hyperlipidemia, unspecified: Secondary | ICD-10-CM | POA: Diagnosis not present

## 2023-10-19 DIAGNOSIS — K760 Fatty (change of) liver, not elsewhere classified: Secondary | ICD-10-CM | POA: Diagnosis present

## 2023-10-19 DIAGNOSIS — Z7983 Long term (current) use of bisphosphonates: Secondary | ICD-10-CM

## 2023-10-19 DIAGNOSIS — I700 Atherosclerosis of aorta: Secondary | ICD-10-CM | POA: Diagnosis not present

## 2023-10-19 DIAGNOSIS — R4182 Altered mental status, unspecified: Secondary | ICD-10-CM | POA: Diagnosis not present

## 2023-10-19 DIAGNOSIS — M47816 Spondylosis without myelopathy or radiculopathy, lumbar region: Secondary | ICD-10-CM | POA: Diagnosis not present

## 2023-10-19 DIAGNOSIS — R112 Nausea with vomiting, unspecified: Secondary | ICD-10-CM | POA: Diagnosis not present

## 2023-10-19 DIAGNOSIS — I10 Essential (primary) hypertension: Secondary | ICD-10-CM | POA: Diagnosis not present

## 2023-10-19 DIAGNOSIS — Z1152 Encounter for screening for COVID-19: Secondary | ICD-10-CM | POA: Diagnosis not present

## 2023-10-19 DIAGNOSIS — M25552 Pain in left hip: Secondary | ICD-10-CM | POA: Diagnosis not present

## 2023-10-19 DIAGNOSIS — Q631 Lobulated, fused and horseshoe kidney: Secondary | ICD-10-CM | POA: Diagnosis not present

## 2023-10-19 LAB — CBC WITH DIFFERENTIAL/PLATELET
Abs Immature Granulocytes: 0 10*3/uL (ref 0.00–0.07)
Basophils Absolute: 0 10*3/uL (ref 0.0–0.1)
Basophils Relative: 0 %
Eosinophils Absolute: 0 10*3/uL (ref 0.0–0.5)
Eosinophils Relative: 0 %
HCT: 37.8 % (ref 36.0–46.0)
Hemoglobin: 12.5 g/dL (ref 12.0–15.0)
Lymphocytes Relative: 32 %
Lymphs Abs: 5.8 10*3/uL — ABNORMAL HIGH (ref 0.7–4.0)
MCH: 28.2 pg (ref 26.0–34.0)
MCHC: 33.1 g/dL (ref 30.0–36.0)
MCV: 85.3 fL (ref 80.0–100.0)
Monocytes Absolute: 0.9 10*3/uL (ref 0.1–1.0)
Monocytes Relative: 5 %
Neutro Abs: 11.3 10*3/uL — ABNORMAL HIGH (ref 1.7–7.7)
Neutrophils Relative %: 63 %
Platelets: 30 10*3/uL — ABNORMAL LOW (ref 150–400)
RBC: 4.43 MIL/uL (ref 3.87–5.11)
RDW: 14 % (ref 11.5–15.5)
WBC: 18 10*3/uL — ABNORMAL HIGH (ref 4.0–10.5)
nRBC: 0 % (ref 0.0–0.2)

## 2023-10-19 LAB — COMPREHENSIVE METABOLIC PANEL WITH GFR
ALT: 206 U/L — ABNORMAL HIGH (ref 0–44)
AST: 135 U/L — ABNORMAL HIGH (ref 15–41)
Albumin: 4.1 g/dL (ref 3.5–5.0)
Alkaline Phosphatase: 117 U/L (ref 38–126)
Anion gap: 11 (ref 5–15)
BUN: 42 mg/dL — ABNORMAL HIGH (ref 8–23)
CO2: 23 mmol/L (ref 22–32)
Calcium: 9 mg/dL (ref 8.9–10.3)
Chloride: 94 mmol/L — ABNORMAL LOW (ref 98–111)
Creatinine, Ser: 1.24 mg/dL — ABNORMAL HIGH (ref 0.44–1.00)
GFR, Estimated: 48 mL/min — ABNORMAL LOW (ref >60)
Glucose, Bld: 102 mg/dL — ABNORMAL HIGH (ref 70–99)
Potassium: 3.5 mmol/L (ref 3.5–5.1)
Sodium: 128 mmol/L — ABNORMAL LOW (ref 135–145)
Total Bilirubin: 2.4 mg/dL — ABNORMAL HIGH (ref 0.0–1.2)
Total Protein: 6.6 g/dL (ref 6.5–8.1)

## 2023-10-19 LAB — URINALYSIS, ROUTINE W REFLEX MICROSCOPIC
Bacteria, UA: NONE SEEN
Bilirubin Urine: NEGATIVE
Glucose, UA: NEGATIVE mg/dL
Ketones, ur: NEGATIVE mg/dL
Nitrite: NEGATIVE
Protein, ur: NEGATIVE mg/dL
Specific Gravity, Urine: 1.027 (ref 1.005–1.030)
pH: 5 (ref 5.0–8.0)

## 2023-10-19 LAB — RESP PANEL BY RT-PCR (RSV, FLU A&B, COVID)  RVPGX2
Influenza A by PCR: NEGATIVE
Influenza B by PCR: NEGATIVE
Resp Syncytial Virus by PCR: NEGATIVE
SARS Coronavirus 2 by RT PCR: NEGATIVE

## 2023-10-19 LAB — I-STAT CG4 LACTIC ACID, ED
Lactic Acid, Venous: 1 mmol/L (ref 0.5–1.9)
Lactic Acid, Venous: 1 mmol/L (ref 0.5–1.9)

## 2023-10-19 LAB — CBG MONITORING, ED: Glucose-Capillary: 103 mg/dL — ABNORMAL HIGH (ref 70–99)

## 2023-10-19 MED ORDER — SODIUM CHLORIDE 0.9 % IV BOLUS
1000.0000 mL | Freq: Once | INTRAVENOUS | Status: AC
Start: 2023-10-19 — End: 2023-10-19
  Administered 2023-10-19: 1000 mL via INTRAVENOUS

## 2023-10-19 MED ORDER — SODIUM CHLORIDE 0.9 % IV BOLUS
1000.0000 mL | Freq: Once | INTRAVENOUS | Status: AC
  Administered 2023-10-19: 1000 mL via INTRAVENOUS

## 2023-10-19 MED ORDER — VANCOMYCIN HCL IN DEXTROSE 1-5 GM/200ML-% IV SOLN
1000.0000 mg | Freq: Once | INTRAVENOUS | Status: AC
  Administered 2023-10-19: 1000 mg via INTRAVENOUS
  Filled 2023-10-19: qty 200

## 2023-10-19 MED ORDER — IOHEXOL 300 MG/ML  SOLN
100.0000 mL | Freq: Once | INTRAMUSCULAR | Status: AC | PRN
  Administered 2023-10-19: 100 mL via INTRAVENOUS

## 2023-10-19 MED ORDER — SODIUM CHLORIDE 0.9 % IV SOLN
2.0000 g | Freq: Once | INTRAVENOUS | Status: AC
  Administered 2023-10-19: 2 g via INTRAVENOUS
  Filled 2023-10-19: qty 12.5

## 2023-10-19 MED ORDER — METRONIDAZOLE 500 MG/100ML IV SOLN
500.0000 mg | Freq: Once | INTRAVENOUS | Status: AC
  Administered 2023-10-19: 500 mg via INTRAVENOUS
  Filled 2023-10-19: qty 100

## 2023-10-19 NOTE — ED Triage Notes (Signed)
 Pt reports with weakness and not being able to eat since having her hip injection on the 23rd of May. Pt reports feeling dehydrated.

## 2023-10-19 NOTE — H&P (Signed)
 History and Physical    [name] XLK:[id number] DOB: 23-Sep-1955 DOA: 10/19/2023  Patient coming from: Home.  Chief Complaint: Weakness.  Poor appetite.  HPI: [name] is a 68 y.o. female with history of hypertension, hyperlipidemia, inflammatory bowel disease who has recently been following oncologist Dr. Coni Deep for thrombocytopenia has had bone marrow biopsy which has not shown any definite cause for the thrombocytopenia has been having chronic low back pain.  Pain is mostly on the left side.  Had followed up with the orthopedic surgeon about 10 days ago when she had an injection on the left hip area.  Following that patient has become progressively weak and had at least 2 or 3 episodes of nausea vomiting poor appetite subjective feeling of fever chills and due to persistent symptoms patient presents to the ER.  About 3 days ago patient had mild self-limited epistaxis.  ED Course: In the ER patient is afebrile.  Labs show leukocytosis of 18,000 creatinine of 1.2 which increased from baseline.  Sodium 128.  CT L-spine did not show anything acute x-ray of the hip did not show any fracture.  Platelets  further decreased and it is around 30.  ER physician started patient on emptying of antibiotics after blood cultures were obtained.  Discussed with on-call hematologist about the platelets further decreased and at this time hematologist recommended they will be seeing patient in consult in the morning and to follow CBC.  Review of Systems: As per HPI, rest all negative.   Past Medical History:  Diagnosis Date   B12 DEFICIENCY 02/09/2009   Qualifier: Diagnosis of  By: Rochelle Chu RN, CGRN, Sheri     Blood in stool    COLONIC POLYPS, HYPERPLASTIC, HX OF 02/03/2009   Qualifier: Diagnosis of  By: Surface RN, Antonette Kitchens, COLON 02/03/2009   Qualifier: Diagnosis of  By: Surface RN, Donna     Dyslipidemia 06/20/2012   Fracture, intertrochanteric, left femur (HCC) 05/02/2012   GERD  (gastroesophageal reflux disease)    Hearing loss    Bil/has hearing aids   Hemorrhoids    HEMORRHOIDS 02/03/2009   Qualifier: Diagnosis of  By: Surface RN, Abe Abed     Hypertension    INFLAMMATORY BOWEL DISEASE - Followed by Dr. Adan Holms in GI 02/03/2009   Qualifier: Diagnosis of  By: Surface RN, Manuel Sell    Turner's syndrome    was on Provera and Premarin and d/c this  at age 15yr   Ulcerative colitis     Past Surgical History:  Procedure Laterality Date   COLONOSCOPY  last 03/25/2013   HERNIA REPAIR  2009   lt ing    INCISION AND DRAINAGE  2004    lt thumb dog bite   INTRAMEDULLARY (IM) NAIL INTERTROCHANTERIC  05/02/2012   Procedure: INTRAMEDULLARY (IM) NAIL INTERTROCHANTRIC;  Surgeon: Neville Barbone, MD;  Location: [facility] OR;  Service: Orthopedics;  Laterality: Left;   ORIF FINGER FRACTURE  06/14/2011   Procedure: OPEN REDUCTION INTERNAL FIXATION (ORIF) METACARPAL (FINGER) FRACTURE;  Surgeon: Milagros Alf, MD;  Location: [facility];  Service: Orthopedics;  Laterality: Right;  right ring   POLYPECTOMY     TONSILLECTOMY     UPPER GASTROINTESTINAL ENDOSCOPY       reports that she has never smoked. She has never used smokeless tobacco. She reports current alcohol use of about 1.0 standard drink of alcohol per week. She reports that she does not use drugs.  No Known Allergies  Family History  Problem Relation Age of Onset   Lymphoma Mother        nhl-mantle cell   Lymphoma Father        nhl   Healthy Sister    Melanoma Maternal Grandmother    Heart disease Maternal Grandfather    Ovarian cancer Paternal Grandmother    Pancreatic cancer Paternal Grandfather    Colon cancer Neg Hx    Esophageal cancer Neg Hx    Stomach cancer Neg Hx    Rectal cancer Neg Hx    Breast cancer Neg Hx    Colon polyps Neg Hx     Prior to Admission medications   Medication Sig Start Date End Date Taking? Authorizing Provider  alendronate (FOSAMAX) 70 MG tablet Take 70  mg by mouth once a week. 08/21/21  Yes [provider]  celecoxib  (CELEBREX ) 100 MG capsule Take 100 mg by mouth as needed for mild pain (pain score 1-3) or moderate pain (pain score 4-6). 10/03/23  Yes [provider]  fluticasone  (FLONASE ) 50 MCG/ACT nasal spray SPRAY 2 SPRAYS INTO EACH NOSTRIL EVERY DAY 05/16/23  Yes Aida House, MD  hydrochlorothiazide  (HYDRODIURIL ) 25 MG tablet TAKE 1 TABLET (25 MG TOTAL) BY MOUTH DAILY. 08/12/23  Yes Aida House, MD  lisinopril  (ZESTRIL ) 40 MG tablet TAKE 1 TABLET BY MOUTH EVERY DAY 08/12/23  Yes Aida House, MD  methylPREDNISolone (MEDROL DOSEPAK) 4 MG TBPK tablet Take by mouth as directed. 10/18/23  Yes [provider]  rosuvastatin  (CRESTOR ) 20 MG tablet TAKE 1 TABLET BY MOUTH DAILY. GENERIC EQUIVALENT FOR CRESTOR . NEEDS AN APPOINTMENT 03/18/23  Yes Aida House, MD  VITAMIN D  PO Take 2,000 Units by mouth daily.   Yes [provider]  nystatin (MYCOSTATIN/NYSTOP) powder Apply 1 Application topically 2 (two) times daily. As needed Patient not taking: Reported on 10/19/2023 05/21/23   [provider]  tolterodine  (DETROL  LA) 4 MG 24 hr capsule Take 1 capsule (4 mg total) by mouth daily. Patient not taking: Reported on 10/19/2023 08/04/21   Koberlein, Junell C, MD  traZODone  (DESYREL ) 50 MG tablet TAKE 1/2 TO 1 TABLET BY MOUTH AT BEDTIME AS NEEDED FOR SLEEP Patient not taking: Reported on 08/22/2023 12/13/22   Aida House, MD    Physical Exam: Constitutional: Moderately built and nourished. Vitals:   10/19/23 2004 10/19/23 2100 10/19/23 2130 10/19/23 2200  BP:  (!) 135/93 117/63 (!) 102/57  Pulse:  (!) 109 (!) 103 (!) 103  Resp:  14 15 16   Temp: 98.5 F (36.9 C)     TempSrc:      SpO2:  96% 100% 97%   Eyes: Anicteric no pallor. ENMT: No discharge from the ears/nose or mouth. Neck: No mass felt.  No neck rigidity. Respiratory: No rhonchi or crepitations. Cardiovascular: S1-S2  heard. Abdomen: Soft nontender bowel sound present. Musculoskeletal: No edema. Skin: No rash. Neurologic: Alert awake oriented time place and person.  Moves all extremities. Psychiatric: Appears normal.  Normal affect.   Labs on Admission: I have personally reviewed following labs and imaging studies  CBC: Recent Labs  Lab 10/19/23 1808  WBC 18.0*  NEUTROABS 11.3*  HGB 12.5  HCT 37.8  MCV 85.3  PLT 30*   Basic Metabolic Panel: Recent Labs  Lab 10/19/23 1808  NA 128*  K 3.5  CL 94*  CO2 23  GLUCOSE 102*  BUN 42*  CREATININE 1.24*  CALCIUM  9.0   GFR: CrCl  cannot be calculated (Unknown ideal weight.). Liver Function Tests: Recent Labs  Lab 10/19/23 1808  AST 135*  ALT 206*  ALKPHOS 117  BILITOT 2.4*  PROT 6.6  ALBUMIN  4.1   No results for input(s): "LIPASE", "AMYLASE" in the last 168 hours. No results for input(s): "AMMONIA" in the last 168 hours. Coagulation Profile: No results for input(s): "INR", "PROTIME" in the last 168 hours. Cardiac Enzymes: No results for input(s): "CKTOTAL", "CKMB", "CKMBINDEX", "TROPONINI" in the last 168 hours. BNP (last 3 results) No results for input(s): "PROBNP" in the last 8760 hours. HbA1C: No results for input(s): "HGBA1C" in the last 72 hours. CBG: Recent Labs  Lab 10/19/23 1618  GLUCAP 103*   Lipid Profile: No results for input(s): "CHOL", "HDL", "LDLCALC", "TRIG", "CHOLHDL", "LDLDIRECT" in the last 72 hours. Thyroid  Function Tests: No results for input(s): "TSH", "T4TOTAL", "FREET4", "T3FREE", "THYROIDAB" in the last 72 hours. Anemia Panel: No results for input(s): "VITAMINB12", "FOLATE", "FERRITIN", "TIBC", "IRON", "RETICCTPCT" in the last 72 hours. Urine analysis:    Component Value Date/Time   COLORURINE YELLOW 10/19/2023 2149   APPEARANCEUR CLEAR 10/19/2023 2149   LABSPEC 1.027 10/19/2023 2149   PHURINE 5.0 10/19/2023 2149   GLUCOSEU NEGATIVE 10/19/2023 2149   HGBUR SMALL (A) 10/19/2023 2149    BILIRUBINUR NEGATIVE 10/19/2023 2149   KETONESUR NEGATIVE 10/19/2023 2149   PROTEINUR NEGATIVE 10/19/2023 2149   NITRITE NEGATIVE 10/19/2023 2149   LEUKOCYTESUR TRACE (A) 10/19/2023 2149   Sepsis Labs: @LABRCNTIP (procalcitonin:4,lacticidven:4) ) Recent Results (from the past 240 hours)  Resp panel by RT-PCR (RSV, Flu A&B, Covid) Anterior Nasal Swab     Status: None   Collection Time: 10/19/23  9:49 PM   Specimen: Anterior Nasal Swab  Result Value Ref Range Status   SARS Coronavirus 2 by RT PCR NEGATIVE NEGATIVE Final    Comment: (NOTE) SARS-CoV-2 target nucleic acids are NOT DETECTED.  The SARS-CoV-2 RNA is generally detectable in upper respiratory specimens during the acute phase of infection. The lowest concentration of SARS-CoV-2 viral copies this assay can detect is 138 copies/mL. A negative result does not preclude SARS-Cov-2 infection and should not be used as the sole basis for treatment or other patient management decisions. A negative result may occur with  improper specimen collection/handling, submission of specimen other than nasopharyngeal swab, presence of viral mutation(s) within the areas targeted by this assay, and inadequate number of viral copies(<138 copies/mL). A negative result must be combined with clinical observations, patient history, and epidemiological information. The expected result is Negative.  Fact Sheet for Patients:  BloggerCourse.com  Fact Sheet for Healthcare Providers:  SeriousBroker.it  This test is no t yet approved or cleared by the [place]  FDA and  has been authorized for detection and/or diagnosis of SARS-CoV-2 by FDA under an Emergency Use Authorization (EUA). This EUA will remain  in effect (meaning this test can be used) for the duration of the COVID-19 declaration under Section 564(b)(1) of the Act, 21 U.S.C.section 360bbb-3(b)(1), unless the authorization is terminated  or  revoked sooner.       Influenza A by PCR NEGATIVE NEGATIVE Final   Influenza B by PCR NEGATIVE NEGATIVE Final    Comment: (NOTE) The Xpert Xpress SARS-CoV-2/FLU/RSV plus assay is intended as an aid in the diagnosis of influenza from Nasopharyngeal swab specimens and should not be used as a sole basis for treatment. Nasal washings and aspirates are unacceptable for Xpert Xpress SARS-CoV-2/FLU/RSV testing.  Fact Sheet for Patients: BloggerCourse.com  Fact Sheet for Healthcare  Providers: SeriousBroker.it  This test is not yet approved or cleared by the [place]  FDA and has been authorized for detection and/or diagnosis of SARS-CoV-2 by FDA under an Emergency Use Authorization (EUA). This EUA will remain in effect (meaning this test can be used) for the duration of the COVID-19 declaration under Section 564(b)(1) of the Act, 21 U.S.C. section 360bbb-3(b)(1), unless the authorization is terminated or revoked.     Resp Syncytial Virus by PCR NEGATIVE NEGATIVE Final    Comment: (NOTE) Fact Sheet for Patients: BloggerCourse.com  Fact Sheet for Healthcare Providers: SeriousBroker.it  This test is not yet approved or cleared by the [place]  FDA and has been authorized for detection and/or diagnosis of SARS-CoV-2 by FDA under an Emergency Use Authorization (EUA). This EUA will remain in effect (meaning this test can be used) for the duration of the COVID-19 declaration under Section 564(b)(1) of the Act, 21 U.S.C. section 360bbb-3(b)(1), unless the authorization is terminated or revoked.  Performed at [facility], [address], [place], [place] [address]      Radiological Exams on Admission: DG Hip Unilat W or Wo Pelvis 2-3 Views Left Result Date: 10/19/2023 EXAM: 2 or 3 VIEW(S) XRAY OF THE LEFT HIP 10/19/2023 10:22:00 PM COMPARISON: None available.  CLINICAL HISTORY: Pain. Pt reports with weakness and not being able to eat since having her hip injection on the 23rd of May. Pt reports feeling dehydrated. FINDINGS: BONES AND JOINTS: Stable ORIF of the left hip with deformity. No evidence of hardware complication. Mild degenerative change of the right hip. Mild degenerative changes of the lower lumbar spine. SOFT TISSUES: The soft tissues are unremarkable. IMPRESSION: 1. Stable ORIF of the left hip. No evidence of complication. Electronically signed by: Zadie Herter MD 10/19/2023 10:28 PM EDT RP Workstation: [id number]   CT L-SPINE NO CHARGE Result Date: 10/19/2023 CLINICAL DATA:  Pt reports with weakness and not being able to eat since having her hip injection on the 23rd of May. Pt reports feeling dehydrated. EXAM: CT LUMBAR SPINE WITHOUT CONTRAST TECHNIQUE: Multidetector CT imaging of the lumbar spine was performed without intravenous contrast administration. Multiplanar CT image reconstructions were also generated. RADIATION DOSE REDUCTION: This exam was performed according to the departmental dose-optimization program which includes automated exposure control, adjustment of the mA and/or kV according to patient size and/or use of iterative reconstruction technique. COMPARISON:  MRI lumbar spine 09/09/2023 FINDINGS: Segmentation: 5 lumbar type vertebrae. Alignment: Normal. Vertebrae: Multilevel at least moderate degenerative changes of the spine. Chronic stable anterior wedge compression fracture of the T12 and L1 vertebral bodies. No severe osseous foraminal or central canal stenosis. No acute fracture or focal pathologic process. Paraspinal and other soft tissues: Negative. Disc levels: Multilevel intervertebral disc space vacuum phenomenon. Other: At least moderate atherosclerotic plaque. Horseshoe kidney. Subcentimeter hypodensity within the right kidney too small to characterize-no further follow-up indicated. IMPRESSION: 1. No acute displaced  fracture or traumatic listhesis of the lumbar spine. 2.  Aortic Atherosclerosis (ICD10-I70.0). Electronically Signed   By: Morgane  Naveau M.D.   On: 10/19/2023 20:10   CT ABDOMEN PELVIS W CONTRAST Result Date: 10/19/2023 CLINICAL DATA:  Abdominal pain.  Elevated LFTs. EXAM: CT ABDOMEN AND PELVIS WITH CONTRAST TECHNIQUE: Multidetector CT imaging of the abdomen and pelvis was performed using the standard protocol following bolus administration of intravenous contrast. RADIATION DOSE REDUCTION: This exam was performed according to the departmental dose-optimization program which includes automated exposure control, adjustment of the mA and/or kV according to  patient size and/or use of iterative reconstruction technique. CONTRAST:  OMNIPAQUE  IOHEXOL  300 MG/ML  SOLN COMPARISON:  None Available. FINDINGS: Lower chest: There is scarring in the lung bases. Hepatobiliary: There is diffuse fatty infiltration of the liver. Is 6 cm hypodensity right lobe of the liver which is too small to characterize, possibly a hemangioma. Other etiologies cannot be excluded. Gallbladder and bile ducts are within normal limits. Pancreas: Unremarkable. No pancreatic ductal dilatation or surrounding inflammatory changes. Spleen: Normal in size without focal abnormality. Adrenals/Urinary Tract: Horseshoe kidney present. No hydronephrosis or perinephric fat stranding. There is a subcentimeter hypodensity in the right kidney which is too small to characterize, likely a cyst. Adrenal glands and bladder are within normal limits. Stomach/Bowel: Stomach is within normal limits. Appendix appears normal. No evidence of bowel wall thickening, distention, or inflammatory changes. There is a rounded fat attenuation area in the ascending colon measuring up to 2.6 cm, likely lipoma. Vascular/Lymphatic: Aortic atherosclerosis. No enlarged abdominal or pelvic lymph nodes. Reproductive: Uterus and bilateral adnexa are unremarkable. Other: No abdominal  wall hernia or abnormality. No abdominopelvic ascites. Musculoskeletal: Left hip screw is present. Degenerative changes affect the spine. IMPRESSION: 1. No acute localizing process in the abdomen or pelvis. 2. Fatty infiltration of the liver. 3. Horseshoe kidney. 4. 2.6 cm lipoma in the ascending colon. 5. Aortic atherosclerosis. Aortic Atherosclerosis (ICD10-I70.0). Electronically Signed   By: Tyron Gallon M.D.   On: 10/19/2023 20:03   CT Head Wo Contrast Result Date: 10/19/2023 CLINICAL DATA:  Mental status change, unknown cause. EXAM: CT HEAD WITHOUT CONTRAST TECHNIQUE: Contiguous axial images were obtained from the base of the skull through the vertex without intravenous contrast. RADIATION DOSE REDUCTION: This exam was performed according to the departmental dose-optimization program which includes automated exposure control, adjustment of the mA and/or kV according to patient size and/or use of iterative reconstruction technique. COMPARISON:  None Available. FINDINGS: Brain: No acute intracranial hemorrhage, midline shift, or mass effect is seen. Mild atrophy is noted. There is no extra-axial fluid collection. Gray-white matter differentiation is within normal limits. No hydrocephalus. Vascular: No hyperdense vessel or unexpected calcification. Skull: Normal. Negative for fracture or focal lesion. Sinuses/Orbits: There is partial opacification of the ethmoid air cells bilaterally. The orbits are within normal limits. Other: None. IMPRESSION: No acute intracranial process. Electronically Signed   By: Wyvonnia Heimlich M.D.   On: 10/19/2023 17:33   DG Chest Portable 1 View Result Date: 10/19/2023 CLINICAL DATA:  Altered mental status.  Weakness. EXAM: PORTABLE CHEST 1 VIEW COMPARISON:  05/08/2022. FINDINGS: The heart is enlarged and mediastinal contours are within normal limits. No consolidation, effusion, or pneumothorax. No acute osseous abnormality. IMPRESSION: No active disease. Electronically Signed    By: Wyvonnia Heimlich M.D.   On: 10/19/2023 17:29    EKG: Independently reviewed.  Sinus rhythm with nonspecific changes.  Assessment/Plan Principal Problem:   ARF (acute renal failure) (HCC) Active Problems:   Essential hypertension   H/O Turner syndrome   Dyslipidemia   Hyponatremia   Thrombocytopenia (HCC)   Leucocytosis    Acute renal failure with hyponatremia could be from poor oral intake and dehydration.  Hold patient's hydrochlorothiazide  and lisinopril  for now.  Gently hydrate follow metabolic panel. Leukocytosis patient is afebrile but given that patient is having subjective feeling of fever chills and persistent pain in the left hip area blood cultures cultures obtained and started on empiric antibiotics.  MRI of the left hip and lumbar spine has been ordered. Thrombocytopenia  being followed by oncologist.  Has further decreased.  Dr. Rosaline Coma oncologist on-call was consulted.  Follow CBC.  At this time no active bleeding will need platelet transfusion if patient has active bleeding.  Patient has had recent bone marrow biopsy in March 2025 which did not show any definite malignancy or cause for thrombocytopenia. Hypertension holding lisinopril  and hydrochlorothiazide  due to acute renal failure and hyponatremia and low normal blood pressure.  Follow blood pressure trends. Elevated LFTs cause not clear.  CT scan shows fatty infiltration.  Check acute hepatitis panel follow LFTs.  Hold statins. Hyperlipidemia hold statins for now until LFTs improved.  Patient has severe thrombocytopenia with leukocytosis ongoing left hip pain with acute renal failure and hyponatremia will need close monitoring further workup and more than 2 midnight stay.   DVT prophylaxis: SCDs. Code Status: Full code. Family Communication: Discussed with patient. Disposition Plan: Medical floor. Consults called: ER physician discussed with oncologist. Admission status: Observation.

## 2023-10-19 NOTE — ED Provider Notes (Signed)
 [place] EMERGENCY DEPARTMENT AT [facility] Provider Note   CSN: [id number] Arrival date & time: 10/19/23  1606     History  Chief Complaint  Patient presents with   Weakness    Laure Leone is a 68 y.o. female.  Pt is a 68 yo female with pmhx significant for HTN, UC, GERD, Turner's syndrome, hld, and thrombocytopenia (bone marrow bx in March non-diagnostic).  Pt said she had an injection in her left hip at [facility] last week and has not been doing well since then.  She has increased pain in the hip and has had fevers.  She did f/u with ortho and she said they could not tell her what was wrong.  She's not been eating/drinking much.  She had some gi issues with n/v/d earlier in the week. She's had more nose bleeds.  Friend said she's slurring her words.         Home Medications Prior to Admission medications   Medication Sig Start Date End Date Taking? Authorizing Provider  alendronate (FOSAMAX) 70 MG tablet Take 70 mg by mouth once a week. 08/21/21   [provider]  fluticasone  (FLONASE ) 50 MCG/ACT nasal spray SPRAY 2 SPRAYS INTO EACH NOSTRIL EVERY DAY 05/16/23   Aida House, MD  hydrochlorothiazide  (HYDRODIURIL ) 25 MG tablet TAKE 1 TABLET (25 MG TOTAL) BY MOUTH DAILY. 08/12/23   Aida House, MD  lisinopril  (ZESTRIL ) 40 MG tablet TAKE 1 TABLET BY MOUTH EVERY DAY 08/12/23   Aida House, MD  nystatin (MYCOSTATIN/NYSTOP) powder Apply 1 Application topically 2 (two) times daily. As needed 05/21/23   [provider]  rosuvastatin  (CRESTOR ) 20 MG tablet TAKE 1 TABLET BY MOUTH DAILY. GENERIC EQUIVALENT FOR CRESTOR . NEEDS AN APPOINTMENT 03/18/23   Aida House, MD  tolterodine  (DETROL  LA) 4 MG 24 hr capsule Take 1 capsule (4 mg total) by mouth daily. 08/04/21   Koberlein, Junell C, MD  traZODone  (DESYREL ) 50 MG tablet TAKE 1/2 TO 1 TABLET BY MOUTH AT BEDTIME AS NEEDED FOR SLEEP Patient not taking: Reported on 08/22/2023 12/13/22    Aida House, MD  VITAMIN D  PO Take 2,000 Units by mouth daily.    [provider]      Allergies    Patient has no known allergies.    Review of Systems   Review of Systems  Constitutional:  Positive for appetite change, fatigue and fever.  Musculoskeletal:        Left hip pain  All other systems reviewed and are negative.   Physical Exam Updated Vital Signs BP (!) 102/57   Pulse (!) 103   Temp 98.5 F (36.9 C)   Resp 16   SpO2 97%  Physical Exam Vitals and nursing note reviewed.  Constitutional:      Appearance: Normal appearance. She is obese.  HENT:     Head: Normocephalic and atraumatic.     Comments: Dried blood in nares  Hematoma to tongue    Right Ear: External ear normal.     Left Ear: External ear normal.     Nose: Nose normal.     Mouth/Throat:     Mouth: Mucous membranes are dry.  Eyes:     Extraocular Movements: Extraocular movements intact.     Conjunctiva/sclera: Conjunctivae normal.     Pupils: Pupils are equal, round, and reactive to light.  Cardiovascular:     Rate and Rhythm: Regular rhythm. Tachycardia present.     Pulses: Normal  pulses.     Heart sounds: Normal heart sounds.  Pulmonary:     Effort: Pulmonary effort is normal.     Breath sounds: Normal breath sounds.  Abdominal:     General: Abdomen is flat. Bowel sounds are normal.     Palpations: Abdomen is soft.  Musculoskeletal:     Cervical back: Normal range of motion and neck supple.       Legs:     Comments: Left hip without any pain with rom.  Pt has pain in her left leg with ambulation only.  Skin:    General: Skin is warm.     Capillary Refill: Capillary refill takes less than 2 seconds.  Neurological:     General: No focal deficit present.     Mental Status: She is alert and oriented to person, place, and time.  Psychiatric:        Mood and Affect: Mood normal.        Behavior: Behavior normal.     ED Results / Procedures / Treatments   Labs (all labs  ordered are listed, but only abnormal results are displayed) Labs Reviewed  CBC WITH DIFFERENTIAL/PLATELET - Abnormal; Notable for the following components:      Result Value   WBC 18.0 (*)    Platelets 30 (*)    Neutro Abs 11.3 (*)    Lymphs Abs 5.8 (*)    All other components within normal limits  COMPREHENSIVE METABOLIC PANEL WITH GFR - Abnormal; Notable for the following components:   Sodium 128 (*)    Chloride 94 (*)    Glucose, Bld 102 (*)    BUN 42 (*)    Creatinine, Ser 1.24 (*)    AST 135 (*)    ALT 206 (*)    Total Bilirubin 2.4 (*)    GFR, Estimated 48 (*)    All other components within normal limits  URINALYSIS, ROUTINE W REFLEX MICROSCOPIC - Abnormal; Notable for the following components:   Hgb urine dipstick SMALL (*)    Leukocytes,Ua TRACE (*)    All other components within normal limits  CBG MONITORING, ED - Abnormal; Notable for the following components:   Glucose-Capillary 103 (*)    All other components within normal limits  RESP PANEL BY RT-PCR (RSV, FLU A&B, COVID)  [id number]  CULTURE, BLOOD (ROUTINE X 2)  CULTURE, BLOOD (ROUTINE X 2)  CBG MONITORING, ED  I-STAT CG4 LACTIC ACID, ED  I-STAT CG4 LACTIC ACID, ED    EKG EKG Interpretation Date/Time:  Saturday Oct 19 2023 17:47:11 EDT Ventricular Rate:  84 PR Interval:  121 QRS Duration:  105 QT Interval:  393 QTC Calculation: 465 R Axis:   199  Text Interpretation: Sinus rhythm Probable RVH w/ secondary repol abnormality Since last tracing rate slower Confirmed by Sueellen Emery [id number]) on 10/19/2023 6:43:47 PM  Radiology DG Hip Unilat W or Wo Pelvis 2-3 Views Left Result Date: 10/19/2023 EXAM: 2 or 3 VIEW(S) XRAY OF THE LEFT HIP 10/19/2023 10:22:00 PM COMPARISON: None available. CLINICAL HISTORY: Pain. Pt reports with weakness and not being able to eat since having her hip injection on the 23rd of May. Pt reports feeling dehydrated. FINDINGS: BONES AND JOINTS: Stable ORIF of the left hip with deformity.  No evidence of hardware complication. Mild degenerative change of the right hip. Mild degenerative changes of the lower lumbar spine. SOFT TISSUES: The soft tissues are unremarkable. IMPRESSION: 1. Stable ORIF of the left hip. No evidence of complication.  Electronically signed by: Zadie Herter MD 10/19/2023 10:28 PM EDT RP Workstation: [id number]   CT L-SPINE NO CHARGE Result Date: 10/19/2023 CLINICAL DATA:  Pt reports with weakness and not being able to eat since having her hip injection on the 23rd of May. Pt reports feeling dehydrated. EXAM: CT LUMBAR SPINE WITHOUT CONTRAST TECHNIQUE: Multidetector CT imaging of the lumbar spine was performed without intravenous contrast administration. Multiplanar CT image reconstructions were also generated. RADIATION DOSE REDUCTION: This exam was performed according to the departmental dose-optimization program which includes automated exposure control, adjustment of the mA and/or kV according to patient size and/or use of iterative reconstruction technique. COMPARISON:  MRI lumbar spine 09/09/2023 FINDINGS: Segmentation: 5 lumbar type vertebrae. Alignment: Normal. Vertebrae: Multilevel at least moderate degenerative changes of the spine. Chronic stable anterior wedge compression fracture of the T12 and L1 vertebral bodies. No severe osseous foraminal or central canal stenosis. No acute fracture or focal pathologic process. Paraspinal and other soft tissues: Negative. Disc levels: Multilevel intervertebral disc space vacuum phenomenon. Other: At least moderate atherosclerotic plaque. Horseshoe kidney. Subcentimeter hypodensity within the right kidney too small to characterize-no further follow-up indicated. IMPRESSION: 1. No acute displaced fracture or traumatic listhesis of the lumbar spine. 2.  Aortic Atherosclerosis (ICD10-I70.0). Electronically Signed   By: Morgane  Naveau M.D.   On: 10/19/2023 20:10   CT ABDOMEN PELVIS W CONTRAST Result Date: 10/19/2023 CLINICAL  DATA:  Abdominal pain.  Elevated LFTs. EXAM: CT ABDOMEN AND PELVIS WITH CONTRAST TECHNIQUE: Multidetector CT imaging of the abdomen and pelvis was performed using the standard protocol following bolus administration of intravenous contrast. RADIATION DOSE REDUCTION: This exam was performed according to the departmental dose-optimization program which includes automated exposure control, adjustment of the mA and/or kV according to patient size and/or use of iterative reconstruction technique. CONTRAST:  OMNIPAQUE  IOHEXOL  300 MG/ML  SOLN COMPARISON:  None Available. FINDINGS: Lower chest: There is scarring in the lung bases. Hepatobiliary: There is diffuse fatty infiltration of the liver. Is 6 cm hypodensity right lobe of the liver which is too small to characterize, possibly a hemangioma. Other etiologies cannot be excluded. Gallbladder and bile ducts are within normal limits. Pancreas: Unremarkable. No pancreatic ductal dilatation or surrounding inflammatory changes. Spleen: Normal in size without focal abnormality. Adrenals/Urinary Tract: Horseshoe kidney present. No hydronephrosis or perinephric fat stranding. There is a subcentimeter hypodensity in the right kidney which is too small to characterize, likely a cyst. Adrenal glands and bladder are within normal limits. Stomach/Bowel: Stomach is within normal limits. Appendix appears normal. No evidence of bowel wall thickening, distention, or inflammatory changes. There is a rounded fat attenuation area in the ascending colon measuring up to 2.6 cm, likely lipoma. Vascular/Lymphatic: Aortic atherosclerosis. No enlarged abdominal or pelvic lymph nodes. Reproductive: Uterus and bilateral adnexa are unremarkable. Other: No abdominal wall hernia or abnormality. No abdominopelvic ascites. Musculoskeletal: Left hip screw is present. Degenerative changes affect the spine. IMPRESSION: 1. No acute localizing process in the abdomen or pelvis. 2. Fatty infiltration of  the liver. 3. Horseshoe kidney. 4. 2.6 cm lipoma in the ascending colon. 5. Aortic atherosclerosis. Aortic Atherosclerosis (ICD10-I70.0). Electronically Signed   By: Tyron Gallon M.D.   On: 10/19/2023 20:03   CT Head Wo Contrast Result Date: 10/19/2023 CLINICAL DATA:  Mental status change, unknown cause. EXAM: CT HEAD WITHOUT CONTRAST TECHNIQUE: Contiguous axial images were obtained from the base of the skull through the vertex without intravenous contrast. RADIATION DOSE REDUCTION: This exam was performed according to  the departmental dose-optimization program which includes automated exposure control, adjustment of the mA and/or kV according to patient size and/or use of iterative reconstruction technique. COMPARISON:  None Available. FINDINGS: Brain: No acute intracranial hemorrhage, midline shift, or mass effect is seen. Mild atrophy is noted. There is no extra-axial fluid collection. Gray-white matter differentiation is within normal limits. No hydrocephalus. Vascular: No hyperdense vessel or unexpected calcification. Skull: Normal. Negative for fracture or focal lesion. Sinuses/Orbits: There is partial opacification of the ethmoid air cells bilaterally. The orbits are within normal limits. Other: None. IMPRESSION: No acute intracranial process. Electronically Signed   By: Wyvonnia Heimlich M.D.   On: 10/19/2023 17:33   DG Chest Portable 1 View Result Date: 10/19/2023 CLINICAL DATA:  Altered mental status.  Weakness. EXAM: PORTABLE CHEST 1 VIEW COMPARISON:  05/08/2022. FINDINGS: The heart is enlarged and mediastinal contours are within normal limits. No consolidation, effusion, or pneumothorax. No acute osseous abnormality. IMPRESSION: No active disease. Electronically Signed   By: Wyvonnia Heimlich M.D.   On: 10/19/2023 17:29    Procedures Procedures    Medications Ordered in ED Medications  metroNIDAZOLE (FLAGYL) IVPB 500 mg (500 mg Intravenous New Bag/Given 10/19/23 2204)  vancomycin (VANCOCIN) IVPB  1000 mg/200 mL premix (1,000 mg Intravenous New Bag/Given 10/19/23 2202)  sodium chloride  0.9 % bolus 1,000 mL (0 mLs Intravenous Stopped 10/19/23 2019)  sodium chloride  0.9 % bolus 1,000 mL (1,000 mLs Intravenous Bolus 10/19/23 2016)  iohexol  (OMNIPAQUE ) 300 MG/ML solution 100 mL (100 mLs Intravenous Contrast Given 10/19/23 1951)  ceFEPIme (MAXIPIME) 2 g in sodium chloride  0.9 % 100 mL IVPB (2 g Intravenous New Bag/Given 10/19/23 2151)    ED Course/ Medical Decision Making/ A&P                                 Medical Decision Making Amount and/or Complexity of Data Reviewed Labs: ordered. Radiology: ordered.  Risk Prescription drug management. Decision regarding hospitalization.   This patient presents to the ED for concern of ams, this involves an extensive number of treatment options, and is a complaint that carries with it a high risk of complications and morbidity.  The differential diagnosis includes cva, infection, electrolyte abn, itp,   Co morbidities that complicate the patient evaluation   HTN, UC, GERD, Turner's syndrome, hld, and thrombocytopenia   Additional history obtained:  Additional history obtained from epic chart review External records from outside source obtained and reviewed including friend   Lab Tests:  I Ordered, and personally interpreted labs.  The pertinent results include:  lactic nl, cmp with na low at 128 (137 on 2/26), bun elevated at 42 and cr elevated at 1.24 (bun 21 and cr 0.84 on 2/26); lfts elevated with ast 135 and alt 206, tb elevated at 2.4 (nl in Feb); cbc with wbc elevated at 18, plt low at 30 (stable at 70); ua nl; covid/flu/rsv neg   Imaging Studies ordered:  I ordered imaging studies including cxr and ct head  I independently visualized and interpreted imaging which showed  CXR: No active disease.  CT head: No acute intracranial process.  CT abd/pelvis: No acute localizing process in the abdomen or pelvis.  2. Fatty  infiltration of the liver.  3. Horseshoe kidney.  4. 2.6 cm lipoma in the ascending colon.  5. Aortic atherosclerosis.    Aortic Atherosclerosis (ICD10-I70.0).  CT lumbar: 1. No acute displaced fracture or traumatic listhesis of  the lumbar  spine.  2.  Aortic Atherosclerosis (ICD10-I70.0).  L hip: Stable ORIF of the left hip. No evidence of complication.  I agree with the radiologist interpretation   Cardiac Monitoring:  The patient was maintained on a cardiac monitor.  I personally viewed and interpreted the cardiac monitored which showed an underlying rhythm of: nsr   Medicines ordered and prescription drug management:  I ordered medication including ivfs  for sx  Reevaluation of the patient after these medicines showed that the patient improved I have reviewed the patients home medicines and have made adjustments as needed   Test Considered:  ct   Critical Interventions:  abx   Consultations Obtained:  I requested consultation with the hematologist (Dr. Rosaline Coma),  and discussed lab and imaging findings as well as pertinent plan -he thinks she's in ITP likely from infection.  He recommends holding off steroids unless bleeding worsens.   Pt d/w Dr. [facility] ([facility]) for admission   Problem List / ED Course:  ITP:  Holding off on steroids/tx.  Possibly from gi illness earlier in the week.  All other labs/urine nl.  Mris pending.  Abx given in case pt has sepsis as a source.   Reevaluation:  After the interventions noted above, I reevaluated the patient and found that they have :improved   Social Determinants of Health:  Lives at home   Dispostion:  After consideration of the diagnostic results and the patients response to treatment, I feel that the patent would benefit from admission  CRITICAL CARE Performed by: Sueellen Emery   Total critical care time: 30 minutes  Critical care time was exclusive of separately billable procedures and treating  other patients.  Critical care was necessary to treat or prevent imminent or life-threatening deterioration.  Critical care was time spent personally by me on the following activities: development of treatment plan with patient and/or surrogate as well as nursing, discussions with consultants, evaluation of patient's response to treatment, examination of patient, obtaining history from patient or surrogate, ordering and performing treatments and interventions, ordering and review of laboratory studies, ordering and review of radiographic studies, pulse oximetry and re-evaluation of patient's condition.         Final Clinical Impression(s) / ED Diagnoses Final diagnoses:  Acute ITP [facility])    Rx / DC Orders ED Discharge Orders     None         Sueellen Emery, MD 10/19/23 2259

## 2023-10-19 NOTE — Progress Notes (Signed)
 Pt being followed by [facility] for Sepsis protocol.

## 2023-10-20 ENCOUNTER — Observation Stay (HOSPITAL_COMMUNITY)

## 2023-10-20 DIAGNOSIS — M47816 Spondylosis without myelopathy or radiculopathy, lumbar region: Secondary | ICD-10-CM | POA: Diagnosis not present

## 2023-10-20 DIAGNOSIS — M25552 Pain in left hip: Secondary | ICD-10-CM | POA: Diagnosis not present

## 2023-10-20 DIAGNOSIS — N179 Acute kidney failure, unspecified: Secondary | ICD-10-CM | POA: Diagnosis not present

## 2023-10-20 DIAGNOSIS — M51379 Other intervertebral disc degeneration, lumbosacral region without mention of lumbar back pain or lower extremity pain: Secondary | ICD-10-CM | POA: Diagnosis not present

## 2023-10-20 DIAGNOSIS — M48061 Spinal stenosis, lumbar region without neurogenic claudication: Secondary | ICD-10-CM | POA: Diagnosis not present

## 2023-10-20 LAB — HEPATIC FUNCTION PANEL
ALT: 161 U/L — ABNORMAL HIGH (ref 0–44)
AST: 107 U/L — ABNORMAL HIGH (ref 15–41)
Albumin: 3.4 g/dL — ABNORMAL LOW (ref 3.5–5.0)
Alkaline Phosphatase: 113 U/L (ref 38–126)
Bilirubin, Direct: 0.3 mg/dL — ABNORMAL HIGH (ref 0.0–0.2)
Indirect Bilirubin: 1.3 mg/dL — ABNORMAL HIGH (ref 0.3–0.9)
Total Bilirubin: 1.6 mg/dL — ABNORMAL HIGH (ref 0.0–1.2)
Total Protein: 5.3 g/dL — ABNORMAL LOW (ref 6.5–8.1)

## 2023-10-20 LAB — BASIC METABOLIC PANEL WITH GFR
Anion gap: 9 (ref 5–15)
Anion gap: 5 (ref 5–15)
Anion gap: 11 (ref 5–15)
BUN: 32 mg/dL — ABNORMAL HIGH (ref 8–23)
BUN: 29 mg/dL — ABNORMAL HIGH (ref 8–23)
BUN: 23 mg/dL (ref 8–23)
CO2: 19 mmol/L — ABNORMAL LOW (ref 22–32)
CO2: 22 mmol/L (ref 22–32)
CO2: 19 mmol/L — ABNORMAL LOW (ref 22–32)
Calcium: 8.2 mg/dL — ABNORMAL LOW (ref 8.9–10.3)
Calcium: 8.3 mg/dL — ABNORMAL LOW (ref 8.9–10.3)
Calcium: 8.4 mg/dL — ABNORMAL LOW (ref 8.9–10.3)
Chloride: 104 mmol/L (ref 98–111)
Chloride: 105 mmol/L (ref 98–111)
Chloride: 105 mmol/L (ref 98–111)
Creatinine, Ser: 1 mg/dL (ref 0.44–1.00)
Creatinine, Ser: 0.81 mg/dL (ref 0.44–1.00)
Creatinine, Ser: 0.84 mg/dL (ref 0.44–1.00)
GFR, Estimated: >60 mL/min (ref >60)
GFR, Estimated: >60 mL/min (ref >60)
GFR, Estimated: >60 mL/min (ref >60)
Glucose, Bld: 103 mg/dL — ABNORMAL HIGH (ref 70–99)
Glucose, Bld: 94 mg/dL (ref 70–99)
Glucose, Bld: 127 mg/dL — ABNORMAL HIGH (ref 70–99)
Potassium: 3.4 mmol/L — ABNORMAL LOW (ref 3.5–5.1)
Potassium: 3.2 mmol/L — ABNORMAL LOW (ref 3.5–5.1)
Potassium: 3.9 mmol/L (ref 3.5–5.1)
Sodium: 132 mmol/L — ABNORMAL LOW (ref 135–145)
Sodium: 132 mmol/L — ABNORMAL LOW (ref 135–145)
Sodium: 135 mmol/L (ref 135–145)

## 2023-10-20 LAB — CBC WITH DIFFERENTIAL/PLATELET
Abs Immature Granulocytes: 0 10*3/uL (ref 0.00–0.07)
Basophils Absolute: 0 10*3/uL (ref 0.0–0.1)
Basophils Relative: 0 %
Eosinophils Absolute: 0 10*3/uL (ref 0.0–0.5)
Eosinophils Relative: 0 %
HCT: 32.6 % — ABNORMAL LOW (ref 36.0–46.0)
Hemoglobin: 10.5 g/dL — ABNORMAL LOW (ref 12.0–15.0)
Lymphocytes Relative: 23 %
Lymphs Abs: 4 10*3/uL (ref 0.7–4.0)
MCH: 28 pg (ref 26.0–34.0)
MCHC: 32.2 g/dL (ref 30.0–36.0)
MCV: 86.9 fL (ref 80.0–100.0)
Monocytes Absolute: 0.9 10*3/uL (ref 0.1–1.0)
Monocytes Relative: 5 %
Neutro Abs: 12.7 10*3/uL — ABNORMAL HIGH (ref 1.7–7.7)
Neutrophils Relative %: 72 %
Platelets: 37 10*3/uL — ABNORMAL LOW (ref 150–400)
RBC: 3.75 MIL/uL — ABNORMAL LOW (ref 3.87–5.11)
RDW: 14.3 % (ref 11.5–15.5)
WBC: 17.6 10*3/uL — ABNORMAL HIGH (ref 4.0–10.5)
nRBC: 0 % (ref 0.0–0.2)

## 2023-10-20 LAB — FOLATE: Folate: 9.8 ng/mL (ref >5.9)

## 2023-10-20 LAB — RETIC PANEL
Immature Retic Fract: 7.1 % (ref 2.3–15.9)
RBC.: 3.68 MIL/uL — ABNORMAL LOW (ref 3.87–5.11)
Retic Count, Absolute: 49.7 10*3/uL (ref 19.0–186.0)
Retic Ct Pct: 1.4 % (ref 0.4–3.1)
Reticulocyte Hemoglobin: 32.2 pg (ref >27.9)

## 2023-10-20 LAB — FERRITIN: Ferritin: 269 ng/mL (ref 11–307)

## 2023-10-20 LAB — TSH: TSH: 1.483 u[IU]/mL (ref 0.350–4.500)

## 2023-10-20 LAB — PHOSPHORUS: Phosphorus: 2.6 mg/dL (ref 2.5–4.6)

## 2023-10-20 LAB — MAGNESIUM: Magnesium: 2.1 mg/dL (ref 1.7–2.4)

## 2023-10-20 LAB — HEPATITIS PANEL, ACUTE
HCV Ab: NONREACTIVE
Hep A IgM: NONREACTIVE
Hep B C IgM: NONREACTIVE
Hepatitis B Surface Ag: NONREACTIVE

## 2023-10-20 LAB — HIV ANTIBODY (ROUTINE TESTING W REFLEX): HIV Screen 4th Generation wRfx: NONREACTIVE

## 2023-10-20 LAB — IMMATURE PLATELET FRACTION: Immature Platelet Fraction: 22.7 % — ABNORMAL HIGH (ref 1.2–8.6)

## 2023-10-20 LAB — SEDIMENTATION RATE: Sed Rate: 0 mm/h (ref 0–22)

## 2023-10-20 LAB — CK: Total CK: 103 U/L (ref 38–234)

## 2023-10-20 LAB — C-REACTIVE PROTEIN: CRP: 1.9 mg/dL — ABNORMAL HIGH (ref <1.0)

## 2023-10-20 LAB — IRON AND TIBC
Iron: 47 ug/dL (ref 28–170)
Saturation Ratios: 17 % (ref 10.4–31.8)
TIBC: 276 ug/dL (ref 250–450)
UIBC: 229 ug/dL

## 2023-10-20 LAB — VITAMIN B12: Vitamin B-12: 582 pg/mL (ref 180–914)

## 2023-10-20 MED ORDER — GADOBUTROL 1 MMOL/ML IV SOLN
7.0000 mL | Freq: Once | INTRAVENOUS | Status: AC | PRN
  Administered 2023-10-20: 7 mL via INTRAVENOUS

## 2023-10-20 MED ORDER — ENSURE PLUS HIGH PROTEIN PO LIQD: 237.0000 mL | Freq: Two times a day (BID) | ORAL | Status: DC

## 2023-10-20 MED ORDER — VANCOMYCIN HCL 750 MG/150ML IV SOLN
750.0000 mg | INTRAVENOUS | Status: DC
  Administered 2023-10-20 – 2023-10-21 (×2): 750 mg via INTRAVENOUS
  Filled 2023-10-20 (×2): qty 150

## 2023-10-20 MED ORDER — POTASSIUM CHLORIDE CRYS ER 20 MEQ PO TBCR
40.0000 meq | EXTENDED_RELEASE_TABLET | Freq: Once | ORAL | Status: AC
  Administered 2023-10-20: 40 meq via ORAL
  Filled 2023-10-20: qty 2

## 2023-10-20 MED ORDER — VANCOMYCIN HCL IN DEXTROSE 1-5 GM/200ML-% IV SOLN: 1000.0000 mg | INTRAVENOUS | Status: DC

## 2023-10-20 MED ORDER — LACTATED RINGERS IV SOLN: INTRAVENOUS | Status: DC

## 2023-10-20 MED ORDER — OXYCODONE-ACETAMINOPHEN 7.5-325 MG PO TABS: 1.0000 | ORAL_TABLET | Freq: Four times a day (QID) | ORAL | Status: DC | PRN

## 2023-10-20 MED ORDER — SODIUM CHLORIDE 0.9 % IV SOLN
2.0000 g | Freq: Two times a day (BID) | INTRAVENOUS | Status: AC
  Administered 2023-10-20 – 2023-10-22 (×6): 2 g via INTRAVENOUS
  Filled 2023-10-20 (×6): qty 12.5

## 2023-10-20 NOTE — Progress Notes (Signed)
 Pharmacy Antibiotic Note  [name] is a 68 y.o. female admitted on 10/19/2023 with sepsis.  PMH significant for HTN, UC, Turner's syndrome, thrombocytopenia and recent injection in left hip.  Pharmacy has been consulted for Vancomycin and Cefepime dosing.  This morning, SCr improved to 0.81 and estimated vancomycin AUC recalculated  Plan: Continue Cefepime 2g IV q12h; Pharmacy will sign off on cefepime consult and make any needed renal dose adjustments per protocol Change vancomycin to 750 mg IV every 24 hours (Goal AUC 400-550.  Expected AUC: 493.1  SCr used: 0.81) Follow renal function F/u culture results & sensitivities  Height: 4\' 9"  (144.8 cm) Weight: 71.5 kg (157 lb 9.6 oz) IBW/kg (Calculated) : 38.6  Temp (24hrs), Avg:98.1 F (36.7 C), Min:97.5 F (36.4 C), Max:98.6 F (37 C)  Recent Labs  Lab 10/19/23 1808 10/19/23 1812 10/19/23 2035 10/20/23 0127 10/20/23 0521  WBC 18.0*  --   --  17.6*  --   CREATININE 1.24*  --   --  1.00 0.81  LATICACIDVEN  --  1.0 1.0  --   --     Estimated Creatinine Clearance: 55.1 mL/min (by C-G formula based on SCr of 0.81 mg/dL).    No Known Allergies  Antimicrobials this admission: 5/31 Metronidazole x 1 5/31 Cefepime >>   5/31 Vancomycin >>   Microbiology results: 5/31 BCx:  no growth < 12 hr    Thank you for allowing pharmacy to be a part of this patient's care.  Delpha Fickle, PharmD, BCPS 10/20/2023 8:16 AM

## 2023-10-20 NOTE — Progress Notes (Signed)
 Pharmacy Antibiotic Note  [name] is a 68 y.o. female admitted on 10/19/2023 with sepsis.  PMH significant for HTN, UC, Turner's syndrome, thrombocytopenia and recent injection in left hip.  Pharmacy has been consulted for Vancomycin and Cefepime dosing.  Plan: Cefepime 2g IV q12h Vancomycin 1000 mg IV Q 48 hrs. Goal AUC 400-550.  Expected AUC: 524.5  SCr used: 1.24 Follow renal function F/u culture results & sensitivities  Height: 4\' 9"  (144.8 cm) Weight: 71.5 kg (157 lb 9.6 oz) IBW/kg (Calculated) : 38.6  Temp (24hrs), Avg:98.3 F (36.8 C), Min:97.6 F (36.4 C), Max:98.6 F (37 C)  Recent Labs  Lab 10/19/23 1808 10/19/23 1812 10/19/23 2035  WBC 18.0*  --   --   CREATININE 1.24*  --   --   LATICACIDVEN  --  1.0 1.0    Estimated Creatinine Clearance: 36 mL/min (A) (by C-G formula based on SCr of 1.24 mg/dL (H)).    No Known Allergies  Antimicrobials this admission: 5/31 Metronidazole x 1 5/31 Cefepime >>   5/31 Vancomycin >>  Dose adjustments this admission:    Microbiology results: 5/31 BCx:      Thank you for allowing pharmacy to be a part of this patient's care.  Rulon Councilman, PharmD 10/20/2023 12:58 AM

## 2023-10-20 NOTE — Care Management Obs Status (Signed)
 MEDICARE OBSERVATION STATUS NOTIFICATION   Patient Details  Name: [name] MRN: [id number] Date of Birth: [birth year]   Medicare Observation Status Notification Given:  Yes    Levie Ream, RN 10/20/2023, 5:13 PM

## 2023-10-20 NOTE — TOC Initial Note (Addendum)
 Transition of Care [facility]) - Initial/Assessment Note    Patient Details  Name: [name] MRN: [id number] Date of Birth: Aug 15, 1955  [facility] CM/SW Contact:    Levie Ream, RN Phone Number: 10/20/2023, 5:29 PM  Clinical Narrative:                 TOC for d/c planning; PT recc HHPT; spoke w/ pt in room; pt says she lives at home w/ her husband; she plans to return at d/c; pt identified POC dtr Ruth Jeacoma ([phone]); family will provide transportation; pt verified insurance/PCP; she denied SDOH risks; pt says she has a walker, and grab bars in shower; she does not have HH services, or home oxygen; pt says she would like to discuss receiving recc HHPT w/ her husband; will pass onto oncoming TOC for follow up.  Expected Discharge Plan: Home/Self Care Barriers to Discharge: Continued Medical Work up   Patient Goals and CMS Choice Patient states their goals for this hospitalization and ongoing recovery are:: home CMS Medicare.gov Compare Post Acute Care list provided to:: Patient   [place] ownership interest in [facility].provided to:: Patient    Expected Discharge Plan and Services   Discharge Planning Services: CM Consult   Living arrangements for the past 2 months: Single Family Home                                      Prior Living Arrangements/Services Living arrangements for the past 2 months: Single Family Home Lives with:: Spouse Patient language and need for interpreter reviewed:: Yes Do you feel safe going back to the place where you live?: Yes      Need for Family Participation in Patient Care: Yes (Comment) Care giver support system in place?: Yes (comment) Current home services: DME (walker) Criminal Activity/Legal Involvement Pertinent to Current Situation/Hospitalization: No - Comment as needed  Activities of Daily Living   ADL Screening (condition at time of admission) Independently performs ADLs?: Yes  (appropriate for developmental age) Is the patient deaf or have difficulty hearing?: Yes Does the patient have difficulty seeing, even when wearing glasses/contacts?: No Does the patient have difficulty concentrating, remembering, or making decisions?: No  Permission Sought/Granted Permission sought to share information with : Case Manager Permission granted to share information with : Yes, Verbal Permission Granted  Share Information with NAME: Case Manager     Permission granted to share info w Relationship: Alessandra Hussar (dtr) [phone]     Emotional Assessment Appearance:: Appears stated age Attitude/Demeanor/Rapport: Gracious Affect (typically observed): Accepting Orientation: : Oriented to Self, Oriented to Place, Oriented to  Time, Oriented to Situation Alcohol / Substance Use: Not Applicable Psych Involvement: No (comment)  Admission diagnosis:  ARF (acute renal failure) (HCC) [N17.9] Acute ITP (HCC) [D69.3] Patient Active Problem List   Diagnosis Date Noted   ARF (acute renal failure) (HCC) 10/19/2023   Hyponatremia 10/19/2023   Thrombocytopenia (HCC) 10/19/2023   Leucocytosis 10/19/2023   Elevated LFTs 10/19/2023   Acute leg pain, left 07/19/2022   Osteopenia/?Osteoporosis - followed by her gynecologist, Dr. Johnston Nao 06/20/2012   Dyslipidemia 06/20/2012   H/O Turner syndrome 05/02/2012   Essential hypertension 02/03/2009   INFLAMMATORY BOWEL DISEASE - Followed by GI 02/03/2009   History of colonic polyps 02/03/2009   PCP:  Aida House, MD Pharmacy:   [facility] [id number] [place], [place] - [address] [place] [address] Phone: [phone] Fax: [phone]  [facility] - [place], [place] - [address] [place] [address] Phone: [phone] Fax: [phone]     Social Drivers of Health (SDOH) Social History: SDOH Screenings   Food Insecurity: No Food Insecurity (10/20/2023)   Housing: Low Risk  (10/20/2023)  Transportation Needs: No Transportation Needs (10/20/2023)  Utilities: Not At Risk (10/20/2023)  Alcohol Screen: Low Risk  (07/01/2023)  Depression (PHQ2-9): Low Risk  (07/01/2023)  Financial Resource Strain: Low Risk  (07/01/2023)  Physical Activity: Inactive (07/01/2023)  Social Connections: Socially Integrated (10/20/2023)  Stress: No Stress Concern Present (07/01/2023)  Tobacco Use: Low Risk  (10/19/2023)   SDOH Interventions: Food Insecurity Interventions: Intervention Not Indicated, Inpatient TOC Housing Interventions: Intervention Not Indicated, Inpatient TOC Transportation Interventions: Intervention Not Indicated, Inpatient TOC Utilities Interventions: Intervention Not Indicated, Inpatient TOC   Readmission Risk Interventions     No data to display

## 2023-10-20 NOTE — Evaluation (Signed)
 Physical Therapy Evaluation Patient Details Name: [name] MRN: [id number] DOB: 06/06/1955 Today's Date: 10/20/2023  History of Present Illness  68 yo female admitted with hyponatremia, weakness, ARF, N/V. Hx of chronic pain, L hip pain-s/p injection, thrombocytopenia, bone marrow biopsy, osteopenia, Turner's syndrome, UC, L IM nail 2013  Clinical Impression  On eval, pt was CGA for mobility. She ambulated ~75 feet without a device. Pt reported she has been walking in room/to bathroom without a walker. She reported pain in L hip was better today compared to when she was admitted. She did c/o increased pain in L hip while walking back to her room. 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 She feel comfortable returning home. Recommended she consider RW use at home if she feels unsteady and for pain control. Will recommend HHPT f/u. Pt could also consider OP PT f/u. She did mention she has plans to go on a vacation very soon.       If plan is discharge home, recommend the following: A little help with walking and/or transfers;A little help with bathing/dressing/bathroom;Assistance with cooking/housework;Assist for transportation;Help with stairs or ramp for entrance   Can travel by private vehicle        Equipment Recommendations None recommended by PT  Recommendations for Other Services       Functional Status Assessment Patient has had a recent decline in their functional status and demonstrates the ability to make significant improvements in function in a reasonable and predictable amount of time.     Precautions / Restrictions Precautions Precautions: Fall Restrictions Weight Bearing Restrictions Per Provider Order: No      Mobility  Bed Mobility               General bed mobility comments: oob in recliner    Transfers Overall transfer level: Needs assistance Equipment used: None Transfers: Sit to/from Stand Sit to Stand:  Supervision                Ambulation/Gait Ambulation/Gait assistance: Contact guard assist Gait Distance (Feet): 75 Feet Assistive device: None Gait Pattern/deviations: Antalgic, Decreased stride length       General Gait Details: Fair gait speed. Pt reported increased pain after distance of ~60 feet. Mild unsteadiness.  Stairs            Wheelchair Mobility     Tilt Bed    Modified Rankin (Stroke Patients Only)       Balance Overall balance assessment: Needs assistance         Standing balance support: During functional activity Standing balance-Leahy Scale: Fair                               Pertinent Vitals/Pain Pain Assessment Pain Assessment: Faces Faces Pain Scale: Hurts little more Pain Location: L hip with ambulation Pain Descriptors / Indicators: Aching, Discomfort Pain Intervention(s): Limited activity within patient's tolerance, Monitored during session, Repositioned    Home Living Family/patient expects to be discharged to:: Private residence Living Arrangements: Spouse/significant other   Type of Home: House       Alternate Level Stairs-Number of Steps: 1 Home Layout: Two level;Able to live on main level with bedroom/bathroom Home Equipment: Rolling Walker (2 wheels);Cane - single point      Prior Function Prior Level of Function : Independent/Modified Independent             Mobility Comments:  ambulatory without a device ADLs Comments: mod ind     Extremity/Trunk Assessment   Upper Extremity Assessment Upper Extremity Assessment: Overall WFL for tasks assessed    Lower Extremity Assessment Lower Extremity Assessment: Generalized weakness    Cervical / Trunk Assessment Cervical / Trunk Assessment: Normal  Communication   Communication Communication: No apparent difficulties    Cognition Arousal: Alert Behavior During Therapy: WFL for tasks assessed/performed   PT - Cognitive impairments: No  apparent impairments                         Following commands: Intact       Cueing Cueing Techniques: Verbal cues     General Comments      Exercises     Assessment/Plan    PT Assessment Patient needs continued PT services  PT Problem List Decreased strength;Decreased range of motion;Decreased activity tolerance;Decreased balance;Decreased mobility;Decreased knowledge of use of DME;Pain       PT Treatment Interventions DME instruction;Gait training;Functional mobility training;Therapeutic activities;Patient/family education;Balance training    PT Goals (Current goals can be found in the Care Plan section)  Acute Rehab PT Goals Patient Stated Goal: regain plof/independence PT Goal Formulation: With patient Time For Goal Achievement: 11/03/23 Potential to Achieve Goals: Good    Frequency Min 3X/week     Co-evaluation               AM-PAC PT "6 Clicks" Mobility  Outcome Measure Help needed turning from your back to your side while in a flat bed without using bedrails?: None Help needed moving from lying on your back to sitting on the side of a flat bed without using bedrails?: None Help needed moving to and from a bed to a chair (including a wheelchair)?: None Help needed standing up from a chair using your arms (e.g., wheelchair or bedside chair)?: None Help needed to walk in hospital room?: A Little Help needed climbing 3-5 steps with a railing? : A Little 6 Click Score: 22    End of Session Equipment Utilized During Treatment: Gait belt Activity Tolerance: Patient tolerated treatment well;Patient limited by pain Patient left: in chair;with call bell/phone within reach   PT Visit Diagnosis: Muscle weakness (generalized) (M62.81);Difficulty in walking, not elsewhere classified (R26.2)    Time: 1455-1510 PT Time Calculation (min) (ACUTE ONLY): 15 min   Charges:   PT Evaluation $PT Eval Low Complexity: 1 Low   PT General Charges $$ ACUTE PT  VISIT: 1 Visit            Tanda Falter, PT Acute Rehabilitation  Office: [phone]

## 2023-10-20 NOTE — TOC Progression Note (Addendum)
 Transition of Care [facility]) - Progression Note    Patient Details  Name: [name] MRN: [id number] Date of Birth: 1955-12-30  Transition of [facility]) CM/SW Contact  Levie Ream, RN Phone Number: 10/20/2023, 1:52 PM  Clinical Narrative:    Pt in procedural area; unable to deliver MOON and complete TOC assessment.        Expected Discharge Plan and Services                                               Social Determinants of Health (SDOH) Interventions SDOH Screenings   Food Insecurity: No Food Insecurity (10/20/2023)  Housing: Low Risk  (10/20/2023)  Transportation Needs: No Transportation Needs (10/20/2023)  Utilities: Not At Risk (10/20/2023)  Alcohol Screen: Low Risk  (07/01/2023)  Depression (PHQ2-9): Low Risk  (07/01/2023)  Financial Resource Strain: Low Risk  (07/01/2023)  Physical Activity: Inactive (07/01/2023)  Social Connections: Socially Integrated (10/20/2023)  Stress: No Stress Concern Present (07/01/2023)  Tobacco Use: Low Risk  (10/19/2023)    Readmission Risk Interventions     No data to display

## 2023-10-20 NOTE — Progress Notes (Signed)
 PROGRESS NOTE  [name]  DOB: Oct 29, 1955  PCP: Aida House, MD OZH:[id number]  DOA: 10/19/2023  LOS: 0 days  Hospital Day: 2  Brief narrative: [name] is a 68 y.o. female with PMH significant for HTN, HLD, inflammatory bowel disease, diverticulosis, GERD, hemorrhoids, fairly recent thrombocytopenia. 10 days prior to presentation, patient saw an orthopedic surgeon at [facility]opedics for progressively worsening chronic low back pain and received steroid injection on left hip joint. Following that, patient has become progressively weak.  She developed 2-3 episodes of nausea vomiting, poor appetite, subjective fever, chills.  With worsening symptoms, presented to the ED on 5/31.  In the ED, patient was afebrile, heart rate 112, blood pressure in low normal range, breathing on room air. Labs showed WBC count of 18,000, hemoglobin 12.5, platelet low at 30, sodium 128, BUN/creatinine 42/1.24, AST/ALT/total bili elevated, alk phos normal, lactic acid level normal Respiratory virus panel unremarkable Urinalysis showed clear yellow urine with trace leukocytes, no bacteria Blood culture was sent CT head unremarkable Chest x-ray unremarkable CT chest abdomen pelvis with contrast did not show any acute pathology, showed fatty liver, horseshoe kidney Left hip x-ray showed stable ORIF, no complication, soft tissues unremarkable.  Patient was started on broad-spectrum IV antibiotics. Admitted to [facility]  Subjective: Patient was seen and examined this morning.  Pleasant elderly Caucasian female.  Propped up in bed.  Hard of hearing but able to have a meaningful conversation.  Daughter at bedside who also assisted with the history. Chart reviewed No fever, heart rate in 80s, blood pressure in low normal Breathing on room air. Most recent labs from this morning with WC count 17.6, hemoglobin 10.5, platelet 30, sodium 132, potassium 3.2  Assessment and plan: Intractable nausea,  vomiting Presented with 1 week of worsening nausea, vomiting, poor appetite leading to dehydration Unclear etiology CT abdomen without any acute pathology Per patient, symptoms started after she got an injection on the left hip, 10 days prior.  Unclear if these 2 issues are related Negative acute hepatitis panel.  Able to tolerate regular consistency diet this morning.  AKI Acute metabolic acidosis Baseline creatinine normal.  Presented with creatinine mildly elevated 1.24 in the setting of dehydration and use of HCTZ and lisinopril  With IV hydration, creatinine and serum bicarb are both improving. Continue to monitor  Recent Labs    11/19/22 0905 07/01/23 1406 07/17/23 1044 10/19/23 1808 10/20/23 0127 10/20/23 0521  BUN 27* 18 21 42* 32* 29*  CREATININE 0.92 1.37* 0.84 1.24* 1.00 0.81  CO2 26 25 24 23  19* 22   Hyponatremia Presented with low sodium level 128.  Likely hypovolemic and use of hydrochlorothiazide  Improving. Recent Labs  Lab 10/19/23 1808 10/20/23 0127 10/20/23 0521  NA 128* 132* 132*   Thrombocytopenia Since February this year, patient's blood work shows progressively worsening thrombocytopenia.  She was seen by oncologist Dr. Scherrie Curt as an outpatient, underwent bone marrow biopsy which was reportedly unremarkable. Platelet low at 37 today.   Patient reports mild self-limiting epistaxis 3 days ago.  Currently, no active bleeding.  Continue to monitor. Recent Labs  Lab 10/19/23 1808 10/20/23 0127  PLT 30* 37*   Leukocytosis No clear source of infection but given complaint of subjective fever, chills and recent joint injection, may need to rule out septic arthritis. MRI left hip and lumbar spine was ordered on admission. Currently on empiric antibiotics Pain control with as needed Percocet Recent Labs  Lab 10/19/23 1808 10/19/23 1812 10/19/23 2035 10/20/23 0127  WBC 18.0*  --   --  17.6*  LATICACIDVEN  --  1.0 1.0  --    Hypertension PTA meds-  lisinopril , hydrochlorothiazide  Both on hold currently.  Blood pressure low normal range. Continue to monitor  Elevated LFTs CT scan showed fatty liver Continue to monitor.  Unremarkable acute hepatitis panel.  Statin on hold Recent Labs  Lab 10/19/23 1808 10/20/23 0127  AST 135*  --   ALT 206*  --   ALKPHOS 117  --   BILITOT 2.4*  --   PROT 6.6  --   ALBUMIN  4.1  --   PLT 30* 37*      Latest Ref Rng & Units 10/20/2023    1:27 AM  Hepatitis  Hep B Surface Ag NON REACTIVE NON REACTIVE   Hep B IgM NON REACTIVE PENDING   Hep C Ab NON REACTIVE PENDING   Hep A IgM NON REACTIVE PENDING    Hyperlipidemia  Statin on hold due to elevated LFTs.    Mobility: PT eval requested  Goals of care   Code Status: Full Code     DVT prophylaxis:  SCDs Start: 10/19/23 2352   Antimicrobials: Currently on broad-spectrum IV antibiotic coverage with IV cefepime and IV vancomycin Fluid: LR at 75 mL/h Consultants: None Family Communication: Daughter at bedside  Status: Observation Level of care:  Progressive   Patient is from: Home Needs to continue in-hospital care: Needs IV antibiotics, ongoing workup Anticipated d/c to: Pending PT eval, pending clinical course      Diet:  Diet Order             Diet Heart Room service appropriate? Yes; Fluid consistency: Thin  Diet effective now                   Scheduled Meds:  feeding supplement  237 mL Oral BID BM    PRN meds: oxyCODONE -acetaminophen    Infusions:   ceFEPime (MAXIPIME) IV Stopped (10/20/23 0956)   lactated ringers  75 mL/hr at 10/20/23 1052   vancomycin      Antimicrobials: Anti-infectives (From admission, onward)    Start     Dose/Rate Route Frequency Ordered Stop   10/21/23 2200  vancomycin (VANCOCIN) IVPB 1000 mg/200 mL premix  Status:  Discontinued        1,000 mg 200 mL/hr over 60 Minutes Intravenous Every 48 hours 10/20/23 0058 10/20/23 0816   10/20/23 2200  vancomycin (VANCOREADY) IVPB 750  mg/150 mL        750 mg 150 mL/hr over 60 Minutes Intravenous Every 24 hours 10/20/23 0816     10/20/23 1000  ceFEPIme (MAXIPIME) 2 g in sodium chloride  0.9 % 100 mL IVPB        2 g 200 mL/hr over 30 Minutes Intravenous Every 12 hours 10/20/23 0055     10/19/23 2115  ceFEPIme (MAXIPIME) 2 g in sodium chloride  0.9 % 100 mL IVPB        2 g 200 mL/hr over 30 Minutes Intravenous  Once 10/19/23 2106 10/19/23 2315   10/19/23 2115  metroNIDAZOLE (FLAGYL) IVPB 500 mg        500 mg 100 mL/hr over 60 Minutes Intravenous  Once 10/19/23 2106 10/19/23 2315   10/19/23 2115  vancomycin (VANCOCIN) IVPB 1000 mg/200 mL premix        1,000 mg 200 mL/hr over 60 Minutes Intravenous  Once 10/19/23 2106 10/19/23 2315       Objective: Vitals:   10/20/23 0507 10/20/23  0904  BP: (!) 107/56 112/63  Pulse: 84 96  Resp: 16 18  Temp: (!) 97.5 F (36.4 C) 98.2 F (36.8 C)  SpO2: 100% 98%    Intake/Output Summary (Last 24 hours) at 10/20/2023 1057 Last data filed at 10/20/2023 1000 Gross per 24 hour  Intake 2974.93 ml  Output --  Net 2974.93 ml   Filed Weights   10/20/23 0000 10/20/23 0047  Weight: 75.4 kg 71.5 kg   Weight change:  Body mass index is 34.1 kg/m.   Physical Exam: General exam: Pleasant, elderly Caucasian female.  Feels better than at presentation Skin: No rashes, lesions or ulcers. HEENT: Atraumatic, normocephalic, no obvious bleeding Lungs: Clear to auscultation bilaterally,  CVS: S1, S2, no murmur,   GI/Abd: Soft, nontender, nondistended, bowel sound present,   CNS: Alert, awake, oriented x 3 Psychiatry: Mood appropriate,  Extremities: No pedal edema, no calf tenderness,   Data Review: I have personally reviewed the laboratory data and studies available.  F/u labs ordered Unresulted Labs (From admission, onward)     Start     Ordered   10/20/23 0856  Hepatic function panel  Add-on,   AD        10/20/23 0855   10/19/23 2353  Basic metabolic panel  Now then every 6 hours,    R (with TIMED occurrences)      10/19/23 2353   10/19/23 2352  HIV Antibody (routine testing w rflx)  (HIV Antibody (Routine testing w reflex) panel)  Once,   R        10/19/23 2353            Signed, Hoyt Macleod, MD [facility] 10/20/2023

## 2023-10-20 NOTE — Consult Note (Signed)
 Hematology/Oncology Consult Note  Clinical Summary: [name] is a 68 year old female with medical history significant for thrombocytopenia (likely ITP) who is currently admitted with subjective fever/chills and weakness.  Reason for Consult: Worsening of thrombocytopenia and leukocytosis  HPI: [name] is a 68 year old female with medical history significant for inflammatory bowel disease, diverticulosis, hemorrhoids, and likely ITP who presents with several days of nausea, vomiting, poor appetite, and subjective fever/chills.  In the emergency department she was found to be tachycardic with blood counts showing white blood cells 18, hemoglobin 12.5, and platelets at 30 (baseline has been higher on prior review).  On review of prior records [name] is under the care of Dr[facility]Sherrill at [facility].  She recently underwent a bone marrow biopsy which did not reveal a clear etiology for her thrombocytopenia.  Thrombocytopenia appears to be most consistent with ITP.   On exam today [name] reports she feels much better after receiving IV hydration and being in the hospital.  She notes that she did have nosebleeds a few days ago but is not currently having any bleeding, bruising, or dark stools.  She reports this all began about 3 days after she received a steroid injection in her hip.  She notes that she does have a history of inflammatory bowel disease but has not recently had any flares of her IBD.  She notes that she is not seeing any blood in the urine or stool.  She is not having any focal infectious symptoms at this time.  A full 10 point ROS is otherwise negative.  O:  Vitals:   10/20/23 0507 10/20/23 0904  BP: (!) 107/56 112/63  Pulse: 84 96  Resp: 16 18  Temp: (!) 97.5 F (36.4 C) 98.2 F (36.8 C)  SpO2: 100% 98%      Latest Ref Rng & Units 10/20/2023   11:04 AM 10/20/2023    5:21 AM 10/20/2023    1:27 AM  CMP  Glucose 70 - 99 mg/dL  161  94  096   BUN 8 - 23 mg/dL 23  29  32   Creatinine 0.44 - 1.00 mg/dL 0.45  4.09  8.11   Sodium 135 - 145 mmol/L 135  132  132   Potassium 3.5 - 5.1 mmol/L 3.9  3.2  3.4   Chloride 98 - 111 mmol/L 105  105  104   CO2 22 - 32 mmol/L 19  22  19    Calcium  8.9 - 10.3 mg/dL 8.4  8.3  8.2       Latest Ref Rng & Units 10/20/2023    1:27 AM 10/19/2023    6:08 PM 08/22/2023    9:15 AM  CBC  WBC 4.0 - 10.5 K/uL 17.6  18.0  12.0   Hemoglobin 12.0 - 15.0 g/dL 91.4  78.2  95.6   Hematocrit 36.0 - 46.0 % 32.6  37.8  38.7   Platelets 150 - 400 K/uL 37  30  76       GENERAL: well appearing elderly Caucasian female in NAD  SKIN: skin color, texture, turgor are normal, no rashes or significant lesions EYES: conjunctiva are pink and non-injected, sclera clear LUNGS: clear to auscultation and percussion with normal breathing effort HEART: regular rate & rhythm and no murmurs and no lower extremity edema Musculoskeletal: no cyanosis of digits and no clubbing  PSYCH: alert & oriented x 3, fluent speech NEURO: no focal motor/sensory deficits  Assessment/Plan: [name] is a 68 year old female with medical history significant for thrombocytopenia (likely ITP) who is currently admitted with subjective fever/chills and weakness.  At this time I suspect the patient has a leukocytosis due to some kind of summation/infection which has worsened her chronic ITP.  # Neutrophilic Predominate Leukocytosis # Acute Worsening of Chronic Thrombocytopenia # Acute Normocytic Anemia  -- At this time findings appear most consistent with inflammation/infection worsening a chronic leukocytosis/thrombocytopenia. -- If the patient not having any active bleeding would recommend holding on steroid therapy unless her platelets were to drop below 30 or if she were to develop active bleeding. -- Drop in hemoglobin from 12.5 on admission down to 10.5.  May be a component of dilution from IV hydration.  Will order anemia  labs today as well. -- Recent bone marrow biopsy shows no evidence of underlying bone marrow disorders. -- Patient is not currently having any focal infectious symptoms at this time. -- Hematology service will continue to follow.    Melissa Clay, MD Department of Hematology/Oncology [facility] Phone: [phone] Pager: [phone] Email: [email or web address]

## 2023-10-21 DIAGNOSIS — N179 Acute kidney failure, unspecified: Secondary | ICD-10-CM | POA: Diagnosis not present

## 2023-10-21 LAB — CBC WITH DIFFERENTIAL/PLATELET
Abs Immature Granulocytes: 0 10*3/uL (ref 0.00–0.07)
Basophils Absolute: 0 10*3/uL (ref 0.0–0.1)
Basophils Relative: 0 %
Eosinophils Absolute: 0 10*3/uL (ref 0.0–0.5)
Eosinophils Relative: 0 %
HCT: 33.7 % — ABNORMAL LOW (ref 36.0–46.0)
Hemoglobin: 10.6 g/dL — ABNORMAL LOW (ref 12.0–15.0)
Lymphocytes Relative: 27 %
Lymphs Abs: 5 10*3/uL — ABNORMAL HIGH (ref 0.7–4.0)
MCH: 28.5 pg (ref 26.0–34.0)
MCHC: 31.5 g/dL (ref 30.0–36.0)
MCV: 90.6 fL (ref 80.0–100.0)
Monocytes Absolute: 2.2 10*3/uL — ABNORMAL HIGH (ref 0.1–1.0)
Monocytes Relative: 12 %
Neutro Abs: 11.4 10*3/uL — ABNORMAL HIGH (ref 1.7–7.7)
Neutrophils Relative %: 61 %
Platelets: 50 10*3/uL — ABNORMAL LOW (ref 150–400)
RBC: 3.72 MIL/uL — ABNORMAL LOW (ref 3.87–5.11)
RDW: 14.6 % (ref 11.5–15.5)
WBC: 18.7 10*3/uL — ABNORMAL HIGH (ref 4.0–10.5)
nRBC: 0 % (ref 0.0–0.2)

## 2023-10-21 LAB — COMPREHENSIVE METABOLIC PANEL WITH GFR
ALT: 138 U/L — ABNORMAL HIGH (ref 0–44)
AST: 76 U/L — ABNORMAL HIGH (ref 15–41)
Albumin: 3.3 g/dL — ABNORMAL LOW (ref 3.5–5.0)
Alkaline Phosphatase: 123 U/L (ref 38–126)
Anion gap: 6 (ref 5–15)
BUN: 14 mg/dL (ref 8–23)
CO2: 23 mmol/L (ref 22–32)
Calcium: 8.1 mg/dL — ABNORMAL LOW (ref 8.9–10.3)
Chloride: 106 mmol/L (ref 98–111)
Creatinine, Ser: 0.56 mg/dL (ref 0.44–1.00)
GFR, Estimated: >60 mL/min (ref >60)
Glucose, Bld: 103 mg/dL — ABNORMAL HIGH (ref 70–99)
Potassium: 3.4 mmol/L — ABNORMAL LOW (ref 3.5–5.1)
Sodium: 135 mmol/L (ref 135–145)
Total Bilirubin: 1.4 mg/dL — ABNORMAL HIGH (ref 0.0–1.2)
Total Protein: 5.4 g/dL — ABNORMAL LOW (ref 6.5–8.1)

## 2023-10-21 MED ORDER — OXYCODONE HCL 5 MG PO TABS: 5.0000 mg | ORAL_TABLET | Freq: Four times a day (QID) | ORAL | Status: DC | PRN

## 2023-10-21 MED ORDER — ACETAMINOPHEN 500 MG PO TABS
1000.0000 mg | ORAL_TABLET | Freq: Three times a day (TID) | ORAL | Status: DC
  Administered 2023-10-21 – 2023-10-23 (×6): 1000 mg via ORAL
  Filled 2023-10-21 (×7): qty 2

## 2023-10-21 NOTE — TOC Progression Note (Signed)
 Transition of Care [facility]) - Progression Note    Patient Details  Name: [name] MRN: [id number] Date of Birth: July 20, 1955  Transition of Care [facility]) CM/SW Contact  Kynzee Devinney, Thersia Flax, RN Phone Number: 10/21/2023, 2:55 PM  Clinical Narrative: Patient no preference HHPT-[facility] able to accept. Has own transport home.      Expected Discharge Plan: Home w Home Health Services Barriers to Discharge: Continued Medical Work up  Expected Discharge Plan and Services   Discharge Planning Services: CM Consult   Living arrangements for the past 2 months: Single Family Home                           HH Arranged: PT HH Agency: Other - See comment ([facility]) Date HH Agency Contacted: 10/21/23 Time HH Agency Contacted: 1455 Representative spoke with at [facility] Agency: [facility]   Social Determinants of Health (SDOH) Interventions SDOH Screenings   Food Insecurity: No Food Insecurity (10/20/2023)  Housing: Low Risk  (10/20/2023)  Transportation Needs: No Transportation Needs (10/20/2023)  Utilities: Not At Risk (10/20/2023)  Alcohol Screen: Low Risk  (07/01/2023)  Depression (PHQ2-9): Low Risk  (07/01/2023)  Financial Resource Strain: Low Risk  (07/01/2023)  Physical Activity: Inactive (07/01/2023)  Social Connections: Socially Integrated (10/20/2023)  Stress: No Stress Concern Present (07/01/2023)  Tobacco Use: Low Risk  (10/19/2023)    Readmission Risk Interventions     No data to display

## 2023-10-21 NOTE — Progress Notes (Signed)
 PROGRESS NOTE  [name]  DOB: March 31, 1956  PCP: Aida House, MD WUJ:[id number]  DOA: 10/19/2023  LOS: 0 days  Hospital Day: 3  Brief narrative: [name] is a 68 y.o. female with PMH significant for HTN, HLD, inflammatory bowel disease, diverticulosis, GERD, hemorrhoids, fairly recent thrombocytopenia. 10 days prior to presentation, patient saw an orthopedic surgeon at [facility] orthopedics for progressively worsening chronic low back pain and received steroid injection on left hip joint. Following that, patient has become progressively weak.  She developed 2-3 episodes of nausea vomiting, poor appetite, subjective fever, chills.  With worsening symptoms, presented to the ED on 5/31.  In the ED, patient was afebrile, heart rate 112, blood pressure in low normal range, breathing on room air. Labs showed WBC count of 18,000, hemoglobin 12.5, platelet low at 30, sodium 128, BUN/creatinine 42/1.24, AST/ALT/total bili elevated, alk phos normal, lactic acid level normal Respiratory virus panel unremarkable Urinalysis showed clear yellow urine with trace leukocytes, no bacteria Blood culture was sent CT head unremarkable Chest x-ray unremarkable CT chest abdomen pelvis with contrast did not show any acute pathology, showed fatty liver, horseshoe kidney Left hip x-ray showed stable ORIF, no complication, soft tissues unremarkable.  Patient was started on broad-spectrum IV antibiotics. Admitted to [facility]  Subjective: Patient was seen and examined this morning.  Propped up in bed.  Not in distress.  Feels better.  Was able to walk yesterday with less pain.  Not been using as needed pain meds.  No fever.  Labs from this morning showed WBC count elevated to 18.7, platelet improved to 50.  Assessment and plan: Intractable nausea, vomiting Presented with 1 week of worsening nausea, vomiting, poor appetite leading to dehydration Unclear etiology CT abdomen without any acute  pathology Per patient, symptoms started after she got an injection on the left hip, 10 days prior.  It does not seem that her symptoms are directly related to the steroid injection. Negative acute hepatitis panel.  Clinically improving.  Able to tolerate regular consistency diet  Intractable back pain 6/1, MRI left hip did not show any acute findings, showed postoperative changes 6/1, MRI lumbar spine showed right eccentric disc bulge and facet arthropathy at L4-L5 resulting in moderate right neural foraminal narrowing.  Also showed disc bulge at L5-S1 with lateral disc contacting the exiting nerve L5 nerve root Pain seems to be improving.  Able to ambulate without any pain meds  Leukocytosis Given her complaint of subjective fever, chills and recent joint injection, imagings were done which ruled out septic arthritis  No fever.  However WBC count worsening.  Unclear cause.  Not on steroids currently. Continue antibiotics.  Repeat CBC in a.m. Continue to monitor Recent Labs  Lab 10/19/23 1808 10/19/23 1812 10/19/23 2035 10/20/23 0127 10/21/23 0809  WBC 18.0*  --   --  17.6* 18.7*  LATICACIDVEN  --  1.0 1.0  --   --    Thrombocytopenia Since February this year, patient's blood work shows progressively worsening thrombocytopenia.  She was seen by oncologist Dr. Scherrie Curt as an outpatient, underwent bone marrow biopsy which was reportedly unremarkable. Platelet level improved from 37 yesterday to 50 today. Currently, no active bleeding. Recent Labs  Lab 10/19/23 1808 10/20/23 0127 10/21/23 0809  PLT 30* 37* 50*    AKI Acute metabolic acidosis Baseline creatinine normal.  Presented with creatinine mildly elevated 1.24 in the setting of dehydration and use of HCTZ and lisinopril  With IV hydration, creatinine and serum bicarb are both  improving. Continue to monitor  Recent Labs    11/19/22 0905 07/01/23 1406 07/17/23 1044 10/19/23 1808 10/20/23 0127 10/20/23 0521  10/20/23 1104 10/21/23 0809  BUN 27* 18 21 42* 32* 29* 23 14  CREATININE 0.92 1.37* 0.84 1.24* 1.00 0.81 0.84 0.56  CO2 26 25 24 23  19* 22 19* 23   Hyponatremia Presented with low sodium level 128.  Likely hypovolemic and use of hydrochlorothiazide  Improving. Recent Labs  Lab 10/19/23 1808 10/20/23 0127 10/20/23 0521 10/20/23 1104 10/21/23 0809  NA 128* 132* 132* 135 135   Hypertension PTA meds- lisinopril , hydrochlorothiazide  Both on hold currently.  Blood pressure low normal range. Continue to monitor  Elevated LFTs CT scan showed fatty liver Continue to monitor.  Unremarkable acute hepatitis panel.  Statin on hold Recent Labs  Lab 10/19/23 1808 10/20/23 0127 10/20/23 0521 10/21/23 0809  AST 135*  --  107* 76*  ALT 206*  --  161* 138*  ALKPHOS 117  --  113 123  BILITOT 2.4*  --  1.6* 1.4*  BILIDIR  --   --  0.3*  --   IBILI  --   --  1.3*  --   PROT 6.6  --  5.3* 5.4*  ALBUMIN  4.1  --  3.4* 3.3*  PLT 30* 37*  --  50*   Hyperlipidemia  Statin on hold due to elevated LFTs.    Mobility: PT eval obtained.  Home with PT recommended  Goals of care   Code Status: Full Code     DVT prophylaxis:  SCDs Start: 10/19/23 2352   Antimicrobials: Currently on broad-spectrum IV antibiotic coverage with IV cefepime and IV vancomycin Fluid: Stop LR today Consultants: None Family Communication: Daughter not at bedside today.  Status: Observation Level of care:  Med-Surg   Patient is from: Home Needs to continue in-hospital care: Needs IV antibiotics, ongoing workup Anticipated d/c to: Pending PT eval, pending clinical course      Diet:  Diet Order             Diet Heart Room service appropriate? Yes; Fluid consistency: Thin  Diet effective now                   Scheduled Meds:  acetaminophen   1,000 mg Oral TID   feeding supplement  237 mL Oral BID BM    PRN meds: oxyCODONE    Infusions:   ceFEPime (MAXIPIME) IV 2 g (10/21/23 1030)    vancomycin 750 mg (10/20/23 2216)    Antimicrobials: Anti-infectives (From admission, onward)    Start     Dose/Rate Route Frequency Ordered Stop   10/21/23 2200  vancomycin (VANCOCIN) IVPB 1000 mg/200 mL premix  Status:  Discontinued        1,000 mg 200 mL/hr over 60 Minutes Intravenous Every 48 hours 10/20/23 0058 10/20/23 0816   10/20/23 2200  vancomycin (VANCOREADY) IVPB 750 mg/150 mL        750 mg 150 mL/hr over 60 Minutes Intravenous Every 24 hours 10/20/23 0816     10/20/23 1000  ceFEPIme (MAXIPIME) 2 g in sodium chloride  0.9 % 100 mL IVPB        2 g 200 mL/hr over 30 Minutes Intravenous Every 12 hours 10/20/23 0055     10/19/23 2115  ceFEPIme (MAXIPIME) 2 g in sodium chloride  0.9 % 100 mL IVPB        2 g 200 mL/hr over 30 Minutes Intravenous  Once 10/19/23 2106 10/19/23 2315  10/19/23 2115  metroNIDAZOLE (FLAGYL) IVPB 500 mg        500 mg 100 mL/hr over 60 Minutes Intravenous  Once 10/19/23 2106 10/19/23 2315   10/19/23 2115  vancomycin (VANCOCIN) IVPB 1000 mg/200 mL premix        1,000 mg 200 mL/hr over 60 Minutes Intravenous  Once 10/19/23 2106 10/19/23 2315       Objective: Vitals:   10/21/23 0348 10/21/23 1317  BP: 112/76 (!) 108/58  Pulse: 76 83  Resp: (!) 21 20  Temp: 98.4 F (36.9 C) 97.9 F (36.6 C)  SpO2: 97% 100%    Intake/Output Summary (Last 24 hours) at 10/21/2023 1343 Last data filed at 10/21/2023 0900 Gross per 24 hour  Intake 2159.52 ml  Output --  Net 2159.52 ml   Filed Weights   10/20/23 0000 10/20/23 0047  Weight: 75.4 kg 71.5 kg   Weight change:  Body mass index is 34.1 kg/m.   Physical Exam: General exam: Pleasant, elderly Caucasian female.  Feels better than at presentation Skin: No rashes, lesions or ulcers. HEENT: Atraumatic, normocephalic, no obvious bleeding Lungs: Clear to auscultation bilaterally,  CVS: S1, S2, no murmur,   GI/Abd: Soft, nontender, nondistended, bowel sound present CNS: Alert, awake, oriented x  3 Psychiatry: Mood appropriate,  Extremities: No pedal edema, no calf tenderness,   Data Review: I have personally reviewed the laboratory data and studies available.  F/u labs ordered Unresulted Labs (From admission, onward)     Start     Ordered   10/22/23 0500  CBC with Differential/Platelet  Tomorrow morning,   R        10/21/23 1343   10/22/23 0500  Comprehensive metabolic panel with GFR  Tomorrow morning,   R        10/21/23 1343            Signed, Hoyt Macleod, MD [facility] 10/21/2023

## 2023-10-22 DIAGNOSIS — E871 Hypo-osmolality and hyponatremia: Secondary | ICD-10-CM | POA: Diagnosis present

## 2023-10-22 DIAGNOSIS — Z8041 Family history of malignant neoplasm of ovary: Secondary | ICD-10-CM | POA: Diagnosis not present

## 2023-10-22 DIAGNOSIS — R112 Nausea with vomiting, unspecified: Secondary | ICD-10-CM | POA: Diagnosis not present

## 2023-10-22 DIAGNOSIS — Z79899 Other long term (current) drug therapy: Secondary | ICD-10-CM | POA: Diagnosis not present

## 2023-10-22 DIAGNOSIS — H9193 Unspecified hearing loss, bilateral: Secondary | ICD-10-CM | POA: Diagnosis present

## 2023-10-22 DIAGNOSIS — D72829 Elevated white blood cell count, unspecified: Secondary | ICD-10-CM | POA: Diagnosis not present

## 2023-10-22 DIAGNOSIS — K760 Fatty (change of) liver, not elsewhere classified: Secondary | ICD-10-CM | POA: Diagnosis present

## 2023-10-22 DIAGNOSIS — E860 Dehydration: Secondary | ICD-10-CM | POA: Diagnosis present

## 2023-10-22 DIAGNOSIS — Z7983 Long term (current) use of bisphosphonates: Secondary | ICD-10-CM | POA: Diagnosis not present

## 2023-10-22 DIAGNOSIS — R7989 Other specified abnormal findings of blood chemistry: Secondary | ICD-10-CM | POA: Diagnosis not present

## 2023-10-22 DIAGNOSIS — Z807 Family history of other malignant neoplasms of lymphoid, hematopoietic and related tissues: Secondary | ICD-10-CM | POA: Diagnosis not present

## 2023-10-22 DIAGNOSIS — E785 Hyperlipidemia, unspecified: Secondary | ICD-10-CM | POA: Diagnosis present

## 2023-10-22 DIAGNOSIS — M25552 Pain in left hip: Secondary | ICD-10-CM | POA: Diagnosis not present

## 2023-10-22 DIAGNOSIS — E538 Deficiency of other specified B group vitamins: Secondary | ICD-10-CM | POA: Diagnosis present

## 2023-10-22 DIAGNOSIS — D693 Immune thrombocytopenic purpura: Secondary | ICD-10-CM | POA: Diagnosis present

## 2023-10-22 DIAGNOSIS — M5136 Other intervertebral disc degeneration, lumbar region with discogenic back pain only: Secondary | ICD-10-CM | POA: Diagnosis present

## 2023-10-22 DIAGNOSIS — Z8249 Family history of ischemic heart disease and other diseases of the circulatory system: Secondary | ICD-10-CM | POA: Diagnosis not present

## 2023-10-22 DIAGNOSIS — I10 Essential (primary) hypertension: Secondary | ICD-10-CM | POA: Diagnosis present

## 2023-10-22 DIAGNOSIS — G8929 Other chronic pain: Secondary | ICD-10-CM | POA: Diagnosis present

## 2023-10-22 DIAGNOSIS — Z800 Family history of malignant neoplasm of digestive organs: Secondary | ICD-10-CM | POA: Diagnosis not present

## 2023-10-22 DIAGNOSIS — N179 Acute kidney failure, unspecified: Secondary | ICD-10-CM | POA: Diagnosis present

## 2023-10-22 DIAGNOSIS — E8721 Acute metabolic acidosis: Secondary | ICD-10-CM | POA: Diagnosis present

## 2023-10-22 DIAGNOSIS — Q969 Turner's syndrome, unspecified: Secondary | ICD-10-CM | POA: Diagnosis not present

## 2023-10-22 DIAGNOSIS — Z808 Family history of malignant neoplasm of other organs or systems: Secondary | ICD-10-CM | POA: Diagnosis not present

## 2023-10-22 DIAGNOSIS — K219 Gastro-esophageal reflux disease without esophagitis: Secondary | ICD-10-CM | POA: Diagnosis present

## 2023-10-22 DIAGNOSIS — E861 Hypovolemia: Secondary | ICD-10-CM | POA: Diagnosis present

## 2023-10-22 DIAGNOSIS — Z1152 Encounter for screening for COVID-19: Secondary | ICD-10-CM | POA: Diagnosis not present

## 2023-10-22 LAB — URINALYSIS, W/ REFLEX TO CULTURE (INFECTION SUSPECTED)
Bacteria, UA: NONE SEEN
Bilirubin Urine: NEGATIVE
Glucose, UA: NEGATIVE mg/dL
Ketones, ur: NEGATIVE mg/dL
Leukocytes,Ua: NEGATIVE
Nitrite: NEGATIVE
Protein, ur: NEGATIVE mg/dL
Specific Gravity, Urine: 1.004 — ABNORMAL LOW (ref 1.005–1.030)
pH: 5 (ref 5.0–8.0)

## 2023-10-22 LAB — COMPREHENSIVE METABOLIC PANEL WITH GFR
ALT: 114 U/L — ABNORMAL HIGH (ref 0–44)
AST: 56 U/L — ABNORMAL HIGH (ref 15–41)
Albumin: 3.1 g/dL — ABNORMAL LOW (ref 3.5–5.0)
Alkaline Phosphatase: 117 U/L (ref 38–126)
Anion gap: 6 (ref 5–15)
BUN: 15 mg/dL (ref 8–23)
CO2: 21 mmol/L — ABNORMAL LOW (ref 22–32)
Calcium: 8.5 mg/dL — ABNORMAL LOW (ref 8.9–10.3)
Chloride: 107 mmol/L (ref 98–111)
Creatinine, Ser: 0.45 mg/dL (ref 0.44–1.00)
GFR, Estimated: >60 mL/min (ref >60)
Glucose, Bld: 116 mg/dL — ABNORMAL HIGH (ref 70–99)
Potassium: 3.6 mmol/L (ref 3.5–5.1)
Sodium: 134 mmol/L — ABNORMAL LOW (ref 135–145)
Total Bilirubin: 0.9 mg/dL (ref 0.0–1.2)
Total Protein: 5.3 g/dL — ABNORMAL LOW (ref 6.5–8.1)

## 2023-10-22 LAB — CBC WITH DIFFERENTIAL/PLATELET
Abs Immature Granulocytes: 2.2 10*3/uL — ABNORMAL HIGH (ref 0.00–0.07)
Band Neutrophils: 9 %
Basophils Absolute: 0 10*3/uL (ref 0.0–0.1)
Basophils Relative: 0 %
Eosinophils Absolute: 0.2 10*3/uL (ref 0.0–0.5)
Eosinophils Relative: 1 %
HCT: 32.9 % — ABNORMAL LOW (ref 36.0–46.0)
Hemoglobin: 10.2 g/dL — ABNORMAL LOW (ref 12.0–15.0)
Lymphocytes Relative: 24 %
Lymphs Abs: 5.9 10*3/uL — ABNORMAL HIGH (ref 0.7–4.0)
MCH: 28.2 pg (ref 26.0–34.0)
MCHC: 31 g/dL (ref 30.0–36.0)
MCV: 90.9 fL (ref 80.0–100.0)
Metamyelocytes Relative: 6 %
Monocytes Absolute: 2.5 10*3/uL — ABNORMAL HIGH (ref 0.1–1.0)
Monocytes Relative: 10 %
Myelocytes: 3 %
Neutro Abs: 13.7 10*3/uL — ABNORMAL HIGH (ref 1.7–7.7)
Neutrophils Relative %: 47 %
Platelets: 63 10*3/uL — ABNORMAL LOW (ref 150–400)
RBC: 3.62 MIL/uL — ABNORMAL LOW (ref 3.87–5.11)
RDW: 14.6 % (ref 11.5–15.5)
WBC: 24.5 10*3/uL — ABNORMAL HIGH (ref 4.0–10.5)
nRBC: 0 % (ref 0.0–0.2)

## 2023-10-22 MED ORDER — ALENDRONATE SODIUM 70 MG PO TABS: 70.0000 mg | ORAL_TABLET | ORAL | Status: DC

## 2023-10-22 MED ORDER — SODIUM CHLORIDE 0.9 % IV SOLN: INTRAVENOUS | Status: DC

## 2023-10-22 MED ORDER — VITAMIN D 25 MCG (1000 UNIT) PO TABS
2000.0000 [IU] | ORAL_TABLET | Freq: Every day | ORAL | Status: DC
  Administered 2023-10-22 – 2023-10-23 (×2): 2000 [IU] via ORAL
  Filled 2023-10-22 (×2): qty 2

## 2023-10-22 NOTE — Consult Note (Addendum)
 [facility]    Date of Admission:  10/19/2023   Total days of inpatient antibiotics 4        Reason for Consult: leukocytosis    Principal Problem:   ARF (acute renal failure) (HCC) Active Problems:   Essential hypertension   H/O Turner syndrome   Dyslipidemia   Hyponatremia   Thrombocytopenia (HCC)   Leucocytosis   Elevated LFTs   Assessment: 68 year old female with history of thrombocytopenia likely ITP followed by Dr. Scherrie Curt oncology, IBD, diverticulosis, hemorrhoids admitted with AKI found to have: #Chronic leukocytosis-trending up #Left hip pain with history of left ORIF->resolved #medrol dose pack #N/V->improving. Denies diarrhea  -Patient underwent left hip injection around 6/23 for hip pain she notes that a few days later she started having nausea and vomiting with subjective fevers.  Her hip pain and symptoms has not improved.  Presented the ED where she was found to have AKI, WBC 18K.  She was started on cefepime and vancomycin. -Patient has had CT and MR lumbar spine which did not reveal infection.  MR left hip which also did not show any infection.  CT abdomen pelvis does not show acute process. -Oncology following this suspect her findings are consistent inflammation and infectious process worsening chronic leukocytosis/thrombocytopenia   Recommendations:  -Stop cefepime after last dose today.  Her symptoms are not consistent with a UTI.  I discussed order and parents are not for urine and not concern for UTI.  UA done today and on admission was benign. -Stop vanc -I suspect pt had a viral GI illness leading to N/V(worsening the chronic leukocytosis). Hip pain could have worsened due to generalized body aches she was having. She states she was able to ambulate today and hip pain has resolved. No concern for infection at hip on my exam today - In addition medrol dose pack was dispensed on 5/30->will verify withpt tomorrow if she  took any. -Would follow  wbc for another day to see trend.  -Standard precautions  Evaluation of this patient requires complex antimicrobial therapy evaluation and counseling + isolation needs for disease transmission risk assessment and mitigation   Microbiology:   Antibiotics:  Cefepime 5/31- Vanc 6/1- Cultures: Blood 5/31 ng Urine  Other   HPI: [name] is a 68 y.o. female with past medical history of hypertension, hyperlipidemia, IBS, thrombocytopenia followed by oncology Dr. Bartholomew Light with bone marrow biopsy not showing definitive cause, underwent hip injection around 4/23.  She noted that she developed nausea and vomiting a few days later along with fevers.  Denies any diarrhea.  Presented to the ED with WBC 18K.  Patient was afebrile.CT abdomen pelvis showed no acute process.  CT L-spine showed no acute process.  MRI left hip showed no joint effusion or periarticular collection.  No osseous abnormality.  ORIF noted.  MR lumbar spine with and without contrast showed L4-L5 disc bulge, L5-S 1.  Patient has been on cefepime and vancomycin.  ID engaged as white cell count 24K.   Review of Systems: Review of Systems  All other systems reviewed and are negative.   Past Medical History:  Diagnosis Date   B12 DEFICIENCY 02/09/2009   Qualifier: Diagnosis of  By: Rochelle Chu RN, CGRN, Sheri     Blood in stool    COLONIC POLYPS, HYPERPLASTIC, HX OF 02/03/2009   Qualifier: Diagnosis of  By: Surface RN, Donna     DIVERTICULOSIS, COLON 02/03/2009   Qualifier: Diagnosis of  ByLuiz Sakai RN, Donna     Dyslipidemia 06/20/2012   Fracture, intertrochanteric, left femur (HCC) 05/02/2012   GERD (gastroesophageal reflux disease)    Hearing loss    Bil/has hearing aids   Hemorrhoids    HEMORRHOIDS 02/03/2009   Qualifier: Diagnosis of  By: Surface RN, Abe Abed     Hypertension    INFLAMMATORY BOWEL DISEASE - Followed by Dr. Adan Holms in GI 02/03/2009   Qualifier: Diagnosis of  By: Surface RN, Manuel Sell    Turner's syndrome    was on Provera and Premarin and d/c this  at age 56yr   Ulcerative colitis     Social History   Tobacco Use   Smoking status: Never   Smokeless tobacco: Never  Vaping Use   Vaping status: Never Used  Substance Use Topics   Alcohol use: Yes    Alcohol/week: 1.0 standard drink of alcohol    Types: 1 Glasses of wine per week    Comment: occ.   Drug use: No    Family History  Problem Relation Age of Onset   Lymphoma Mother        nhl-mantle cell   Lymphoma Father        nhl   Healthy Sister    Melanoma Maternal Grandmother    Heart disease Maternal Grandfather    Ovarian cancer Paternal Grandmother    Pancreatic cancer Paternal Grandfather    Colon cancer Neg Hx    Esophageal cancer Neg Hx    Stomach cancer Neg Hx    Rectal cancer Neg Hx    Breast cancer Neg Hx    Colon polyps Neg Hx    Scheduled Meds:  acetaminophen   1,000 mg Oral TID   cholecalciferol  2,000 Units Oral Daily   feeding supplement  237 mL Oral BID BM   Continuous Infusions:  sodium chloride  75 mL/hr at 10/22/23 1200   PRN Meds:.oxyCODONE  No Known Allergies  OBJECTIVE: Blood pressure 131/71, pulse 82, temperature (!) 97.3 F (36.3 C), temperature source Oral, resp. rate 17, height 4\' 9"  (1.448 m), weight 71.5 kg, SpO2 99%.  Physical Exam Constitutional:      Appearance: Normal appearance.  HENT:     Head: Normocephalic and atraumatic.     Right Ear: Tympanic membrane normal.     Left Ear: Tympanic membrane normal.     Nose: Nose normal.     Mouth/Throat:     Mouth: Mucous membranes are moist.  Eyes:     Extraocular Movements: Extraocular movements intact.     Conjunctiva/sclera: Conjunctivae normal.     Pupils: Pupils are equal, round, and reactive to light.  Cardiovascular:     Rate and Rhythm: Normal rate and regular rhythm.     Heart sounds: No murmur heard.    No friction rub. No gallop.  Pulmonary:     Effort: Pulmonary effort is normal.      Breath sounds: Normal breath sounds.  Abdominal:     General: Abdomen is flat.     Palpations: Abdomen is soft.  Skin:    General: Skin is warm and dry.  Neurological:     General: No focal deficit present.     Mental Status: She is alert and oriented to person, place, and time.  Psychiatric:        Mood and Affect: Mood normal.     Lab Results Lab Results  Component Value Date   WBC 24.5 (H) 10/22/2023  HGB 10.2 (L) 10/22/2023   HCT 32.9 (L) 10/22/2023   MCV 90.9 10/22/2023   PLT 63 (L) 10/22/2023    Lab Results  Component Value Date   CREATININE 0.45 10/22/2023   BUN 15 10/22/2023   NA 134 (L) 10/22/2023   K 3.6 10/22/2023   CL 107 10/22/2023   CO2 21 (L) 10/22/2023    Lab Results  Component Value Date   ALT 114 (H) 10/22/2023   AST 56 (H) 10/22/2023   ALKPHOS 117 10/22/2023   BILITOT 0.9 10/22/2023       Orlie Bjornstad, MD [facility] 10/22/2023, 11:42 PM

## 2023-10-22 NOTE — Progress Notes (Signed)
 Physical Therapy Treatment Patient Details Name: [name] MRN: [id number] DOB: Sep 27, 1955 Today's Date: 10/22/2023   History of Present Illness 68 yo female admitted with hyponatremia, weakness, ARF, N/V. Hx of chronic pain, L hip pain-s/p injection, thrombocytopenia, bone marrow biopsy, osteopenia, Turner's syndrome, UC, L IM nail 2013    PT Comments  AxO x 3 pleasant Lady.  Progressing well with her mobility.  Tolerated amb full unit > 500 feet with NO walker. LPT has rec HH PT.   If plan is discharge home, recommend the following: A little help with walking and/or transfers;A little help with bathing/dressing/bathroom;Assistance with cooking/housework;Assist for transportation;Help with stairs or ramp for entrance   Can travel by private vehicle        Equipment Recommendations  None recommended by PT    Recommendations for Other Services       Precautions / Restrictions Precautions Precautions: Fall     Mobility  Bed Mobility               General bed mobility comments: OOB in recliner    Transfers Overall transfer level: Needs assistance Equipment used: None Transfers: Sit to/from Stand Sit to Stand: Supervision           General transfer comment: good safety cognition    Ambulation/Gait Ambulation/Gait assistance: Supervision, Contact guard assist Gait Distance (Feet): 500 Feet Assistive device: None Gait Pattern/deviations: Antalgic, Decreased stride length       General Gait Details: tolerated an increased distance amb around the unit NO walker   Stairs             Wheelchair Mobility     Tilt Bed    Modified Rankin (Stroke Patients Only)       Balance                                            Communication Communication Communication: No apparent difficulties  Cognition Arousal: Alert Behavior During Therapy: WFL for tasks assessed/performed   PT - Cognitive impairments: No apparent  impairments                       PT - Cognition Comments: AxO x 3 pleasant and motivated Following commands: Intact      Cueing Cueing Techniques: Verbal cues  Exercises      General Comments        Pertinent Vitals/Pain Pain Assessment Pain Assessment: No/denies pain    Home Living                          Prior Function            PT Goals (current goals can now be found in the care plan section) Progress towards PT goals: Progressing toward goals    Frequency    Min 3X/week      PT Plan      Co-evaluation              AM-PAC PT "6 Clicks" Mobility   Outcome Measure  Help needed turning from your back to your side while in a flat bed without using bedrails?: Total Help needed moving from lying on your back to sitting on the side of a flat bed without using bedrails?: Total Help needed moving to and from a bed to a chair (including a wheelchair)?:  Total Help needed standing up from a chair using your arms (e.g., wheelchair or bedside chair)?: Total Help needed to walk in hospital room?: A Lot Help needed climbing 3-5 steps with a railing? : A Lot 6 Click Score: 8    End of Session Equipment Utilized During Treatment: Gait belt   Patient left: in chair;with call bell/phone within reach Nurse Communication: Mobility status PT Visit Diagnosis: Muscle weakness (generalized) (M62.81);Difficulty in walking, not elsewhere classified (R26.2)     Time: 1452-1510 PT Time Calculation (min) (ACUTE ONLY): 18 min  Charges:    $Gait Training: 8-22 mins PT General Charges $$ ACUTE PT VISIT: 1 Visit                     [name]  PTA Acute  Rehabilitation Services Office M-F          [phone]

## 2023-10-22 NOTE — Plan of Care (Signed)

## 2023-10-22 NOTE — Progress Notes (Signed)
 PROGRESS NOTE  [name]  DOB: 1956-02-23  PCP: Aida House, MD BJY:[id number]  DOA: 10/19/2023  LOS: 0 days  Hospital Day: 4  Brief narrative: [name] is a 68 y.o. female with PMH significant for HTN, HLD, inflammatory bowel disease, diverticulosis, GERD, hemorrhoids, fairly recent thrombocytopenia. 10 days prior to presentation, patient saw an orthopedic surgeon at [facility] orthopedics for progressively worsening chronic low back pain and received steroid injection on left hip joint. Following that, patient has become progressively weak.  She developed 2-3 episodes of nausea vomiting, poor appetite, subjective fever, chills.  With worsening symptoms, presented to the ED on 5/31.  In the ED, patient was afebrile, heart rate 112, blood pressure in low normal range, breathing on room air. Labs showed WBC count of 18,000, hemoglobin 12.5, platelet low at 30, sodium 128, BUN/creatinine 42/1.24, AST/ALT/total bili elevated, alk phos normal, lactic acid level normal Respiratory virus panel unremarkable Urinalysis showed clear yellow urine with trace leukocytes, no bacteria Blood culture was sent CT head unremarkable Chest x-ray unremarkable CT chest abdomen pelvis with contrast did not show any acute pathology, showed fatty liver, horseshoe kidney Left hip x-ray showed stable ORIF, no complication, soft tissues unremarkable.  Patient was started on broad-spectrum IV antibiotics. Admitted to [facility]  Subjective: Patient was seen and examined this morning.  Propped up in bed.  Feels weak, complain of dry mouth, dark foul-smelling urine. Daughter at bedside. Labs this morning with WBC count rising up.  Assessment and plan: Intractable nausea, vomiting Presented with 1 week of worsening nausea, vomiting, poor appetite leading to dehydration Unclear etiology CT abdomen without any acute pathology Per patient, symptoms started after she got an injection on the left hip,  10 days prior.  It does not seem that her symptoms are directly related to the steroid injection. Negative acute hepatitis panel.  Gradually improved in terms of symptoms and hydration.  However, since yesterday, feels weak, dry mouth again.  Wants to restart on IV fluid  Leukocytosis Rule out UTI WBC count continues to rise up, 24.5 today.   Today, patient mentions burning, dark, foul-smelling urine . Obtain urinalysis and culture Currently on IV cefepime and vancomycin since admission I have requested ID consultation. Recent Labs  Lab 10/19/23 1808 10/19/23 1812 10/19/23 2035 10/20/23 0127 10/21/23 0809 10/22/23 0459  WBC 18.0*  --   --  17.6* 18.7* 24.5*  LATICACIDVEN  --  1.0 1.0  --   --   --    Intractable back pain 6/1, MRI left hip did not show any acute findings, showed postoperative changes 6/1, MRI lumbar spine showed right eccentric disc bulge and facet arthropathy at L4-L5 resulting in moderate right neural foraminal narrowing.  Also showed disc bulge at L5-S1 with lateral disc contacting the exiting nerve L5 nerve root Pain seems to be improving.  Able to ambulate without any pain meds  Thrombocytopenia Since February this year, patient's blood work shows progressively worsening thrombocytopenia.  She was seen by oncologist Dr. Scherrie Curt as an outpatient, underwent bone marrow biopsy which was reportedly unremarkable. Platelet level improved from 37 yesterday to 50 today. Currently, no active bleeding. Recent Labs  Lab 10/19/23 1808 10/20/23 0127 10/21/23 0809 10/22/23 0459  PLT 30* 37* 50* 63*    AKI Acute metabolic acidosis Baseline creatinine normal.  Presented with creatinine mildly elevated 1.24 in the setting of dehydration and use of HCTZ and lisinopril  With IV hydration, creatinine and serum bicarb are both improving. Continue to monitor  Recent Labs    11/19/22 0905 07/01/23 1406 07/17/23 1044 10/19/23 1808 10/20/23 0127 10/20/23 0521  10/20/23 1104 10/21/23 0809 10/22/23 0459  BUN 27* 18 21 42* 32* 29* 23 14 15   CREATININE 0.92 1.37* 0.84 1.24* 1.00 0.81 0.84 0.56 0.45  CO2 26 25 24 23  19* 22 19* 23 21*   Hyponatremia Presented with low sodium level 128.  Likely hypovolemic and use of hydrochlorothiazide  Improving. Recent Labs  Lab 10/19/23 1808 10/20/23 0127 10/20/23 0521 10/20/23 1104 10/21/23 0809 10/22/23 0459  NA 128* 132* 132* 135 135 134*   Hypertension PTA meds- lisinopril , hydrochlorothiazide  Both on hold currently.  Blood pressure mostly in normal range Continue to monitor  Elevated LFTs CT scan showed fatty liver Continue to monitor.  Unremarkable acute hepatitis panel.  Statin on hold Recent Labs  Lab 10/19/23 1808 10/20/23 0127 10/20/23 0521 10/21/23 0809 10/22/23 0459  AST 135*  --  107* 76* 56*  ALT 206*  --  161* 138* 114*  ALKPHOS 117  --  113 123 117  BILITOT 2.4*  --  1.6* 1.4* 0.9  BILIDIR  --   --  0.3*  --   --   IBILI  --   --  1.3*  --   --   PROT 6.6  --  5.3* 5.4* 5.3*  ALBUMIN  4.1  --  3.4* 3.3* 3.1*  PLT 30* 37*  --  50* 63*   Hyperlipidemia  Statin on hold due to elevated LFTs.    Mobility: PT eval obtained.  Home with PT recommended  Goals of care   Code Status: Full Code     DVT prophylaxis:  SCDs Start: 10/19/23 2352   Antimicrobials: Currently on broad-spectrum IV antibiotic coverage with IV cefepime and IV vancomycin Fluid: Patient and family wanted IV fluid restarted.  Could start normal saline at 75 mL/h Consultants: None Family Communication: Daughter at bedside today.  Status: Observation Level of care:  Med-Surg   Patient is from: Home Needs to continue in-hospital care: Needs IV antibiotics, ongoing workup, ID consult pending Anticipated d/c to: Hopefully home with home within 1 to 2 days     Diet:  Diet Order             Diet Heart Room service appropriate? Yes; Fluid consistency: Thin  Diet effective now                    Scheduled Meds:  acetaminophen   1,000 mg Oral TID   feeding supplement  237 mL Oral BID BM    PRN meds: oxyCODONE    Infusions:   sodium chloride  75 mL/hr at 10/22/23 1200   ceFEPime (MAXIPIME) IV 2 g (10/22/23 1017)   vancomycin Stopped (10/21/23 2340)    Antimicrobials: Anti-infectives (From admission, onward)    Start     Dose/Rate Route Frequency Ordered Stop   10/21/23 2200  vancomycin (VANCOCIN) IVPB 1000 mg/200 mL premix  Status:  Discontinued        1,000 mg 200 mL/hr over 60 Minutes Intravenous Every 48 hours 10/20/23 0058 10/20/23 0816   10/20/23 2200  vancomycin (VANCOREADY) IVPB 750 mg/150 mL        750 mg 150 mL/hr over 60 Minutes Intravenous Every 24 hours 10/20/23 0816     10/20/23 1000  ceFEPIme (MAXIPIME) 2 g in sodium chloride  0.9 % 100 mL IVPB        2 g 200 mL/hr over 30 Minutes Intravenous Every 12 hours 10/20/23  1610     10/19/23 2115  ceFEPIme (MAXIPIME) 2 g in sodium chloride  0.9 % 100 mL IVPB        2 g 200 mL/hr over 30 Minutes Intravenous  Once 10/19/23 2106 10/19/23 2315   10/19/23 2115  metroNIDAZOLE (FLAGYL) IVPB 500 mg        500 mg 100 mL/hr over 60 Minutes Intravenous  Once 10/19/23 2106 10/19/23 2315   10/19/23 2115  vancomycin (VANCOCIN) IVPB 1000 mg/200 mL premix        1,000 mg 200 mL/hr over 60 Minutes Intravenous  Once 10/19/23 2106 10/19/23 2315       Objective: Vitals:   10/22/23 0437 10/22/23 1319  BP: 121/84 (!) 145/83  Pulse: 76 80  Resp: 18 16  Temp: 98.6 F (37 C) (!) 97.3 F (36.3 C)  SpO2: 98% 98%    Intake/Output Summary (Last 24 hours) at 10/22/2023 1327 Last data filed at 10/21/2023 2149 Gross per 24 hour  Intake 350 ml  Output --  Net 350 ml   Filed Weights   10/20/23 0000 10/20/23 0047  Weight: 75.4 kg 71.5 kg   Weight change:  Body mass index is 34.1 kg/m.   Physical Exam: General exam: Pleasant, elderly Caucasian female.  Does not feel good today. Skin: No rashes, lesions or ulcers. HEENT:  Atraumatic, normocephalic, no obvious bleeding Lungs: Clear to auscultation bilaterally,  CVS: S1, S2, no murmur,   GI/Abd: Soft, no tenderness in the lower abdomen, nondistended, bowel sound present CNS: Alert, awake, oriented x 3 Psychiatry: Mood appropriate,  Extremities: No pedal edema, no calf tenderness,   Data Review: I have personally reviewed the laboratory data and studies available.  F/u labs ordered Unresulted Labs (From admission, onward)     Start     Ordered   Unscheduled  Basic metabolic panel with GFR  Tomorrow morning,   R        10/22/23 1327   Unscheduled  CBC with Differential/Platelet  Tomorrow morning,   R        10/22/23 1327            Signed, Hoyt Macleod, MD [facility] 10/22/2023

## 2023-10-23 DIAGNOSIS — D696 Thrombocytopenia, unspecified: Secondary | ICD-10-CM

## 2023-10-23 DIAGNOSIS — Q969 Turner's syndrome, unspecified: Secondary | ICD-10-CM

## 2023-10-23 DIAGNOSIS — N179 Acute kidney failure, unspecified: Secondary | ICD-10-CM | POA: Diagnosis not present

## 2023-10-23 DIAGNOSIS — I10 Essential (primary) hypertension: Secondary | ICD-10-CM

## 2023-10-23 DIAGNOSIS — R112 Nausea with vomiting, unspecified: Secondary | ICD-10-CM | POA: Diagnosis not present

## 2023-10-23 DIAGNOSIS — R7989 Other specified abnormal findings of blood chemistry: Secondary | ICD-10-CM | POA: Diagnosis not present

## 2023-10-23 DIAGNOSIS — E785 Hyperlipidemia, unspecified: Secondary | ICD-10-CM | POA: Diagnosis not present

## 2023-10-23 DIAGNOSIS — D72829 Elevated white blood cell count, unspecified: Secondary | ICD-10-CM | POA: Diagnosis not present

## 2023-10-23 DIAGNOSIS — M25552 Pain in left hip: Secondary | ICD-10-CM | POA: Diagnosis not present

## 2023-10-23 LAB — BASIC METABOLIC PANEL WITH GFR
Anion gap: 7 (ref 5–15)
BUN: 11 mg/dL (ref 8–23)
CO2: 23 mmol/L (ref 22–32)
Calcium: 8.3 mg/dL — ABNORMAL LOW (ref 8.9–10.3)
Chloride: 107 mmol/L (ref 98–111)
Creatinine, Ser: 0.64 mg/dL (ref 0.44–1.00)
GFR, Estimated: >60 mL/min (ref >60)
Glucose, Bld: 103 mg/dL — ABNORMAL HIGH (ref 70–99)
Potassium: 3.5 mmol/L (ref 3.5–5.1)
Sodium: 137 mmol/L (ref 135–145)

## 2023-10-23 LAB — CBC WITH DIFFERENTIAL/PLATELET
Abs Immature Granulocytes: 3.96 10*3/uL — ABNORMAL HIGH (ref 0.00–0.07)
Basophils Absolute: 0.3 10*3/uL — ABNORMAL HIGH (ref 0.0–0.1)
Basophils Relative: 1 %
Eosinophils Absolute: 0 10*3/uL (ref 0.0–0.5)
Eosinophils Relative: 0 %
HCT: 32.3 % — ABNORMAL LOW (ref 36.0–46.0)
Hemoglobin: 10 g/dL — ABNORMAL LOW (ref 12.0–15.0)
Immature Granulocytes: 15 %
Lymphocytes Relative: 18 %
Lymphs Abs: 4.7 10*3/uL — ABNORMAL HIGH (ref 0.7–4.0)
MCH: 28.6 pg (ref 26.0–34.0)
MCHC: 31 g/dL (ref 30.0–36.0)
MCV: 92.3 fL (ref 80.0–100.0)
Monocytes Absolute: 4.1 10*3/uL — ABNORMAL HIGH (ref 0.1–1.0)
Monocytes Relative: 16 %
Neutro Abs: 13.2 10*3/uL — ABNORMAL HIGH (ref 1.7–7.7)
Neutrophils Relative %: 50 %
Platelets: 76 10*3/uL — ABNORMAL LOW (ref 150–400)
RBC: 3.5 MIL/uL — ABNORMAL LOW (ref 3.87–5.11)
RDW: 14.8 % (ref 11.5–15.5)
WBC: 26.2 10*3/uL — ABNORMAL HIGH (ref 4.0–10.5)
nRBC: 0 % (ref 0.0–0.2)

## 2023-10-23 NOTE — TOC Transition Note (Signed)
 Transition of Care [facility]) - Discharge Note   Patient Details  Name: [name] MRN: [id number] Date of Birth: 1955/08/11  [facility] CM/SW Contact:  Kathryn Parish, RN Phone Number: 10/23/2023, 12:44 PM   Clinical Narrative:    Per MD patient is medically ready for discharge. [facility] HH for PT has been set up. TOC signing off.   Final next level of care: Home w Home Health Services Barriers to Discharge: Barriers Resolved   Patient Goals and CMS Choice Patient states their goals for this hospitalization and ongoing recovery are:: Home CMS Medicare.gov Compare Post Acute Care list provided to:: Patient Choice offered to / list presented to : Patient [place] ownership interest in [facility].provided to:: Patient    Discharge Placement                       Discharge Plan and Services Additional resources added to the After Visit Summary for     Discharge Planning Services: CM Consult                      HH Arranged: PT [facility] Agency: Other - See comment ([facility] HH) Date HH Agency Contacted: 10/21/23 Time HH Agency Contacted: 1455 Representative spoke with at [facility] Agency: Kasi  Social Drivers of Health (SDOH) Interventions SDOH Screenings   Food Insecurity: No Food Insecurity (10/20/2023)  Housing: Low Risk  (10/20/2023)  Transportation Needs: No Transportation Needs (10/20/2023)  Utilities: Not At Risk (10/20/2023)  Alcohol Screen: Low Risk  (07/01/2023)  Depression (PHQ2-9): Low Risk  (07/01/2023)  Financial Resource Strain: Low Risk  (07/01/2023)  Physical Activity: Inactive (07/01/2023)  Social Connections: Socially Integrated (10/20/2023)  Stress: No Stress Concern Present (07/01/2023)  Tobacco Use: Low Risk  (10/19/2023)     Readmission Risk Interventions     No data to display

## 2023-10-23 NOTE — Progress Notes (Signed)
 PROGRESS NOTE    [name]  ZOX:[id number] DOB: Jan 15, 1956 DOA: 10/19/2023 PCP: Aida House, MD  Subjective: Pt seen and examined. Pt is Hard of hearing.  Hx of Tuner's syndrome(karotype of XO). Pt able to walk 300 feet today with mobility tech. Discussed with ID and heme/onc. Pt is cleared for discharge from both services. Abx were completed yesterday. Pt to f/u with hematology for workup of her chronic leukocytosis and thrombocytopenia.   Hospital Course: HPI:  [name] is a 68 y.o. female with history of hypertension, hyperlipidemia, inflammatory bowel disease who has recently been following oncologist Dr. Coni Deep for thrombocytopenia has had bone marrow biopsy which has not shown any definite cause for the thrombocytopenia has been having chronic low back pain.  Pain is mostly on the left side.  Had followed up with the orthopedic surgeon about 10 days ago when she had an injection on the left hip area.  Following that patient has become progressively weak and had at least 2 or 3 episodes of nausea vomiting poor appetite subjective feeling of fever chills and due to persistent symptoms patient presents to the ER.  About 3 days ago patient had mild self-limited epistaxis.   ED Course: In the ER patient is afebrile.  Labs show leukocytosis of 18,000 creatinine of 1.2 which increased from baseline.  Sodium 128.  CT L-spine did not show anything acute x-ray of the hip did not show any fracture.  Platelets  further decreased and it is around 30.  ER physician started patient on emptying of antibiotics after blood cultures were obtained.  Discussed with on-call hematologist about the platelets further decreased and at this time hematologist recommended they will be seeing patient in consult in the morning and to follow CBC.  Significant Events: Admitted 10/19/2023 for ARF with hyponatremia   Admission Labs: WBC 18, HgB 12.5, plt 30 Na 128, K 3.5, CO2 of 23, BUN 42, Scr 1.25,  glu 102 UA spg 1.027, neg nitrite, trace LE, WBC 0-5, RBC 0-5, no bacteria Covid/flu/rsv negative Hep B Surface Ag, Hep C Ab neg, Hep A IgM neg, Hep B IgM neg CK 103 T prot 5.3, K 3.4, AST 107, ALT 161, alk phos 113, T. Bili 1.6 Ferritin 269 Iron 47, TIBC 276 Vit B12 582 Folate 9.8 CRP 1.9, ESR 0  Admission Imaging Studies:   Significant Labs: HIV NR TSH 1.483  Significant Imaging Studies:   Antibiotic Therapy: Anti-infectives (From admission, onward)    Start     Dose/Rate Route Frequency Ordered Stop   10/21/23 2200  vancomycin (VANCOCIN) IVPB 1000 mg/200 mL premix  Status:  Discontinued        1,000 mg 200 mL/hr over 60 Minutes Intravenous Every 48 hours 10/20/23 0058 10/20/23 0816   10/20/23 2200  vancomycin (VANCOREADY) IVPB 750 mg/150 mL  Status:  Discontinued        750 mg 150 mL/hr over 60 Minutes Intravenous Every 24 hours 10/20/23 0816 10/22/23 1429   10/20/23 1000  ceFEPIme (MAXIPIME) 2 g in sodium chloride  0.9 % 100 mL IVPB        2 g 200 mL/hr over 30 Minutes Intravenous Every 12 hours 10/20/23 0055 10/22/23 2157   10/19/23 2115  ceFEPIme (MAXIPIME) 2 g in sodium chloride  0.9 % 100 mL IVPB        2 g 200 mL/hr over 30 Minutes Intravenous  Once 10/19/23 2106 10/19/23 2315   10/19/23 2115  metroNIDAZOLE (FLAGYL) IVPB 500 mg  500 mg 100 mL/hr over 60 Minutes Intravenous  Once 10/19/23 2106 10/19/23 2315   10/19/23 2115  vancomycin (VANCOCIN) IVPB 1000 mg/200 mL premix        1,000 mg 200 mL/hr over 60 Minutes Intravenous  Once 10/19/23 2106 10/19/23 2315       Procedures:   Consultants: Heme/onc - Rosaline Coma ID  - Zelda Hickman    Assessment and Plan: * ARF (acute renal failure) (HCC) 10-23-2023 AKI has resolved with IVF. Likely due to poor po intake, hydrochlorothiazide  use  Elevated LFTs 10-23-2023 last LFT from 10-22-2023. Show AST 56, ALT 115. Alk phos 117, T. Bili 0.9. f/u with PCP to have them rechecked.  Leucocytosis 10-23-2023 10-23-2023 pt  seen by heme/onc. Pt has had bone marrow biopsy in the past.  WBC 26K. ID does not think pt is infected. Will not discharge on abx. F/u with heme/onc.  Thrombocytopenia (HCC) 10-23-2023 pt seen by heme/onc. Pt has had bone marrow biopsy.  Plt count up to 76K. Pt to f/u with heme/onc as outpatient.  Hyponatremia 10-23-2023 resolved.  Dyslipidemia 10-23-2023 stable  H/O Turner syndrome 10-23-2023 chronic. 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 Pt has had lifelong Turner's syndrome. She never has a menstrual cycle. Never bore any biological children.  Essential hypertension 10-23-2023 stable. Home with ACEI/HCTZ   DVT prophylaxis: SCDs Start: 10/19/23 2352    Code Status: Full Code Family Communication: no family at bedside. Pt is decisional. Disposition Plan: return home Reason for continuing need for hospitalization: stable for DC today. Cleared for discharge by Dr. Barbar Bonus) and Dr. Dorsey(heme/onc).  Objective: Vitals:   10/22/23 1534 10/22/23 2019 10/23/23 0409 10/23/23 1224  BP: 137/74 131/71 (!) 143/82 106/67  Pulse: 87 82 79 70  Resp: 16 17 18 18   Temp: 97.6 F (36.4 C) (!) 97.3 F (36.3 C) (!) 97.5 F (36.4 C) (!) 97.3 F (36.3 C)  TempSrc: Oral Oral    SpO2: 100% 99% 100% 99%  Weight:      Height:        Intake/Output Summary (Last 24 hours) at 10/23/2023 1230 Last data filed at 10/22/2023 1941 Gross per 24 hour  Intake 695.15 ml  Output --  Net 695.15 ml   Filed Weights   10/20/23 0000 10/20/23 0047  Weight: 75.4 kg 71.5 kg    Examination:  Physical Exam Vitals and nursing note reviewed.  Constitutional:      General: She is not in acute distress.    Appearance: She is not toxic-appearing.  HENT:     Head: Normocephalic and atraumatic.     Nose: Nose normal.  Cardiovascular:     Rate and Rhythm: Normal rate and regular rhythm.  Pulmonary:     Effort: Pulmonary effort is normal.     Breath sounds: Normal breath sounds.  Abdominal:      General: Bowel sounds are normal. There is no distension.     Palpations: Abdomen is soft.  Musculoskeletal:     Right lower leg: No edema.     Left lower leg: No edema.  Skin:    General: Skin is dry.     Capillary Refill: Capillary refill takes less than 2 seconds.  Neurological:     Mental Status: She is alert and oriented to person, place, and time.     Comments: Hard of hearing     Data Reviewed: I have personally reviewed following labs and imaging studies  CBC: Recent Labs  Lab 10/19/23 1808 10/20/23 0127 10/21/23 0809  10/22/23 0459 10/23/23 0423  WBC 18.0* 17.6* 18.7* 24.5* 26.2*  NEUTROABS 11.3* 12.7* 11.4* 13.7* 13.2*  HGB 12.5 10.5* 10.6* 10.2* 10.0*  HCT 37.8 32.6* 33.7* 32.9* 32.3*  MCV 85.3 86.9 90.6 90.9 92.3  PLT 30* 37* 50* 63* 76*   Basic Metabolic Panel: Recent Labs  Lab 10/20/23 0127 10/20/23 0521 10/20/23 1104 10/21/23 0809 10/22/23 0459 10/23/23 0423  NA 132* 132* 135 135 134* 137  K 3.4* 3.2* 3.9 3.4* 3.6 3.5  CL 104 105 105 106 107 107  CO2 19* 22 19* 23 21* 23  GLUCOSE 103* 94 127* 103* 116* 103*  BUN 32* 29* 23 14 15 11   CREATININE 1.00 0.81 0.84 0.56 0.45 0.64  CALCIUM  8.2* 8.3* 8.4* 8.1* 8.5* 8.3*  MG 2.1  --   --   --   --   --   PHOS 2.6  --   --   --   --   --    GFR: Estimated Creatinine Clearance: 55.8 mL/min (by C-G formula based on SCr of 0.64 mg/dL). Liver Function Tests: Recent Labs  Lab 10/19/23 1808 10/20/23 0521 10/21/23 0809 10/22/23 0459  AST 135* 107* 76* 56*  ALT 206* 161* 138* 114*  ALKPHOS 117 113 123 117  BILITOT 2.4* 1.6* 1.4* 0.9  PROT 6.6 5.3* 5.4* 5.3*  ALBUMIN  4.1 3.4* 3.3* 3.1*   Cardiac Enzymes: Recent Labs  Lab 10/20/23 0521  CKTOTAL 103   CBG: Recent Labs  Lab 10/19/23 1618  GLUCAP 103*   Anemia Panel: Recent Labs    10/20/23 1317  VITAMINB12 582  FOLATE 9.8  FERRITIN 269  TIBC 276  IRON 47  RETICCTPCT 1.4   Sepsis Labs: Recent Labs  Lab 10/19/23 1812 10/19/23 2035   LATICACIDVEN 1.0 1.0    Recent Results (from the past 240 hours)  Culture, blood (routine x 2)     Status: None (Preliminary result)   Collection Time: 10/19/23  6:04 PM   Specimen: BLOOD  Result Value Ref Range Status   Specimen Description   Final    BLOOD LEFT ANTECUBITAL Performed at [facility], [address], [place], [place] [address]    Special Requests   Final    BOTTLES DRAWN AEROBIC AND ANAEROBIC Blood Culture adequate volume Performed at [facility], [address], [place], [place] [address]    Culture   Final    NO GROWTH 3 DAYS Performed at [facility], [address] [place], [place] [address]    Report Status PENDING  Incomplete  Culture, blood (routine x 2)     Status: None (Preliminary result)   Collection Time: 10/19/23  8:26 PM   Specimen: BLOOD RIGHT FOREARM  Result Value Ref Range Status   Specimen Description   Final    BLOOD RIGHT FOREARM Performed at [facility], [address] [place], [place] [address]    Special Requests   Final    BOTTLES DRAWN AEROBIC AND ANAEROBIC Blood Culture adequate volume Performed at [facility], [address], [place], [place] [address]    Culture   Final    NO GROWTH 2 DAYS Performed at [facility], [address], [place], [place] [address]    Report Status PENDING  Incomplete  Resp panel by RT-PCR (RSV, Flu A&B, Covid) Anterior Nasal Swab     Status: None   Collection Time: 10/19/23  9:49 PM   Specimen: Anterior  Nasal Swab  Result Value Ref Range Status   SARS Coronavirus 2 by RT PCR NEGATIVE NEGATIVE Final    Comment: (NOTE) SARS-CoV-2 target nucleic acids are NOT DETECTED.  The SARS-CoV-2 RNA is generally detectable in upper respiratory specimens during the acute phase of infection. The lowest concentration of SARS-CoV-2 viral copies this assay can detect is 138 copies/mL. A negative result does not preclude  SARS-Cov-2 infection and should not be used as the sole basis for treatment or other patient management decisions. A negative result may occur with  improper specimen collection/handling, submission of specimen other than nasopharyngeal swab, presence of viral mutation(s) within the areas targeted by this assay, and inadequate number of viral copies(<138 copies/mL). A negative result must be combined with clinical observations, patient history, and epidemiological information. The expected result is Negative.  Fact Sheet for Patients:  BloggerCourse.com  Fact Sheet for Healthcare Providers:  SeriousBroker.it  This test is no t yet approved or cleared by the [place]  FDA and  has been authorized for detection and/or diagnosis of SARS-CoV-2 by FDA under an Emergency Use Authorization (EUA). This EUA will remain  in effect (meaning this test can be used) for the duration of the COVID-19 declaration under Section 564(b)(1) of the Act, 21 U.S.C.section 360bbb-3(b)(1), unless the authorization is terminated  or revoked sooner.       Influenza A by PCR NEGATIVE NEGATIVE Final   Influenza B by PCR NEGATIVE NEGATIVE Final    Comment: (NOTE) The Xpert Xpress SARS-CoV-2/FLU/RSV plus assay is intended as an aid in the diagnosis of influenza from Nasopharyngeal swab specimens and should not be used as a sole basis for treatment. Nasal washings and aspirates are unacceptable for Xpert Xpress SARS-CoV-2/FLU/RSV testing.  Fact Sheet for Patients: BloggerCourse.com  Fact Sheet for Healthcare Providers: SeriousBroker.it  This test is not yet approved or cleared by the [place]  FDA and has been authorized for detection and/or diagnosis of SARS-CoV-2 by FDA under an Emergency Use Authorization (EUA). This EUA will remain in effect (meaning this test can be used) for the duration of  the COVID-19 declaration under Section 564(b)(1) of the Act, 21 U.S.C. section 360bbb-3(b)(1), unless the authorization is terminated or revoked.     Resp Syncytial Virus by PCR NEGATIVE NEGATIVE Final    Comment: (NOTE) Fact Sheet for Patients: BloggerCourse.com  Fact Sheet for Healthcare Providers: SeriousBroker.it  This test is not yet approved or cleared by the [place]  FDA and has been authorized for detection and/or diagnosis of SARS-CoV-2 by FDA under an Emergency Use Authorization (EUA). This EUA will remain in effect (meaning this test can be used) for the duration of the COVID-19 declaration under Section 564(b)(1) of the Act, 21 U.S.C. section 360bbb-3(b)(1), unless the authorization is terminated or revoked.  Performed at [facility], [address] [place], [place] [address]      Scheduled Meds:  acetaminophen   1,000 mg Oral TID   cholecalciferol  2,000 Units Oral Daily   feeding supplement  237 mL Oral BID BM   Continuous Infusions:   LOS: 1 day   Time spent: 50 minutes  Unk Garb, DO  [facility]  10/23/2023, 12:30 PM

## 2023-10-23 NOTE — Assessment & Plan Note (Signed)
 10-23-2023 stable

## 2023-10-23 NOTE — Assessment & Plan Note (Signed)
 10-23-2023 chronic. 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 Pt has had lifelong Turner's syndrome. She never has a menstrual cycle. Never bore any biological children.

## 2023-10-23 NOTE — Assessment & Plan Note (Signed)
 10-23-2023 resolved.

## 2023-10-23 NOTE — Discharge Summary (Signed)
 [facility] Phys[facility] Discharge Summary   Patient name: [name]  Admit date:     10/19/2023  Discharge date: 10/23/2023  Attending Physician: DAHAL, BINAYA [[id number]]  Discharge Physician: Unk Garb   PCP: Aida House, MD  Admitted From: Home  Disposition:  Home  Recommendations for Outpatient Follow-up:  Follow up with PCP in 1-2 weeks Follow up with Dr. Scherrie Curt as scheduled Will need repeat CMP to follow up on Na, and LFTs   Home Health:Yes home PT Equipment/Devices: None    Discharge Condition:Stable CODE STATUS:FULL Diet recommendation: Heart Healthy Fluid Restriction: None  Hospital Summary: HPI:  [name] is a 68 y.o. female with history of hypertension, hyperlipidemia, inflammatory bowel disease who has recently been following oncologist Dr. Coni Deep for thrombocytopenia has had bone marrow biopsy which has not shown any definite cause for the thrombocytopenia has been having chronic low back pain.  Pain is mostly on the left side.  Had followed up with the orthopedic surgeon about 10 days ago when she had an injection on the left hip area.  Following that patient has become progressively weak and had at least 2 or 3 episodes of nausea vomiting poor appetite subjective feeling of fever chills and due to persistent symptoms patient presents to the ER.  About 3 days ago patient had mild self-limited epistaxis.   ED Course: In the ER patient is afebrile.  Labs show leukocytosis of 18,000 creatinine of 1.2 which increased from baseline.  Sodium 128.  CT L-spine did not show anything acute x-ray of the hip did not show any fracture.  Platelets  further decreased and it is around 30.  ER physician started patient on emptying of antibiotics after blood cultures were obtained.  Discussed with on-call hematologist about the platelets further decreased and at this time hematologist recommended they will be seeing patient in consult in the morning and to  follow CBC.  Significant Events: Admitted 10/19/2023 for ARF with hyponatremia   Admission Labs: WBC 18, HgB 12.5, plt 30 Na 128, K 3.5, CO2 of 23, BUN 42, Scr 1.25, glu 102 UA spg 1.027, neg nitrite, trace LE, WBC 0-5, RBC 0-5, no bacteria Covid/flu/rsv negative Hep B Surface Ag, Hep C Ab neg, Hep A IgM neg, Hep B IgM neg CK 103 T prot 5.3, K 3.4, AST 107, ALT 161, alk phos 113, T. Bili 1.6 Ferritin 269 Iron 47, TIBC 276 Vit B12 582 Folate 9.8 CRP 1.9, ESR 0  Admission Imaging Studies:   Significant Labs: HIV NR TSH 1.483  Significant Imaging Studies:   Antibiotic Therapy: Anti-infectives (From admission, onward)    Start     Dose/Rate Route Frequency Ordered Stop   10/21/23 2200  vancomycin (VANCOCIN) IVPB 1000 mg/200 mL premix  Status:  Discontinued        1,000 mg 200 mL/hr over 60 Minutes Intravenous Every 48 hours 10/20/23 0058 10/20/23 0816   10/20/23 2200  vancomycin (VANCOREADY) IVPB 750 mg/150 mL  Status:  Discontinued        750 mg 150 mL/hr over 60 Minutes Intravenous Every 24 hours 10/20/23 0816 10/22/23 1429   10/20/23 1000  ceFEPIme (MAXIPIME) 2 g in sodium chloride  0.9 % 100 mL IVPB        2 g 200 mL/hr over 30 Minutes Intravenous Every 12 hours 10/20/23 0055 10/22/23 2157   10/19/23 2115  ceFEPIme (MAXIPIME) 2 g in sodium chloride  0.9 % 100 mL IVPB        2  g 200 mL/hr over 30 Minutes Intravenous  Once 10/19/23 2106 10/19/23 2315   10/19/23 2115  metroNIDAZOLE (FLAGYL) IVPB 500 mg        500 mg 100 mL/hr over 60 Minutes Intravenous  Once 10/19/23 2106 10/19/23 2315   10/19/23 2115  vancomycin (VANCOCIN) IVPB 1000 mg/200 mL premix        1,000 mg 200 mL/hr over 60 Minutes Intravenous  Once 10/19/23 2106 10/19/23 2315       Procedures:   Consultants: Heme/onc - [facility]  - [facility] Course by Problem: * ARF (acute renal failure) (HCC) 10-23-2023 AKI has resolved with IVF. Likely due to poor po intake, hydrochlorothiazide   use  Elevated LFTs 10-23-2023 last LFT from 10-22-2023. Show AST 56, ALT 115. Alk phos 117, T. Bili 0.9. f/u with PCP to have them rechecked.  Leucocytosis 10-23-2023 10-23-2023 pt seen by heme/onc. Pt has had bone marrow biopsy in the past.  WBC 26K. ID does not think pt is infected. Will not discharge on abx. F/u with heme/onc.  Thrombocytopenia (HCC) 10-23-2023 pt seen by heme/onc. Pt has had bone marrow biopsy.  Plt count up to 76K. Pt to f/u with heme/onc as outpatient.  Hyponatremia 10-23-2023 resolved.  Dyslipidemia 10-23-2023 stable  H/O Turner syndrome 10-23-2023 chronic. 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 Pt has had lifelong Turner's syndrome. She never has a menstrual cycle. Never bore any biological children.  Essential hypertension 10-23-2023 stable. Home with ACEI/HCTZ    Discharge Diagnoses:  Principal Problem:   ARF (acute renal failure) (HCC) Active Problems:   Essential hypertension   H/O Turner syndrome   Dyslipidemia   Hyponatremia   Thrombocytopenia (HCC)   Leucocytosis   Elevated LFTs   Discharge Instructions  Discharge Instructions     Call MD for:  difficulty breathing, headache or visual disturbances   Complete by: As directed    Call MD for:  extreme fatigue   Complete by: As directed    Call MD for:  hives   Complete by: As directed    Call MD for:  persistant dizziness or light-headedness   Complete by: As directed    Call MD for:  persistant nausea and vomiting   Complete by: As directed    Call MD for:  redness, tenderness, or signs of infection (pain, swelling, redness, odor or green/yellow discharge around incision site)   Complete by: As directed    Call MD for:  severe uncontrolled pain   Complete by: As directed    Call MD for:  temperature >100.4   Complete by: As directed    Diet - low sodium heart healthy   Complete by: As directed    Discharge instructions   Complete by: As directed    1. Follow up with your  primary care provider in 1-2 weeks following discharge from hospital. 2. Follow up with hematology(Dr. Scherrie Curt) as scheduled.   Increase activity slowly   Complete by: As directed       Allergies as of 10/23/2023   No Known Allergies      Medication List     STOP taking these medications    methylPREDNISolone 4 MG Tbpk tablet Commonly known as: MEDROL DOSEPAK   nystatin powder Commonly known as: MYCOSTATIN/NYSTOP   tolterodine  4 MG 24 hr capsule Commonly known as: DETROL  LA   traZODone  50 MG tablet Commonly known as: DESYREL        TAKE these medications    alendronate 70 MG  tablet Commonly known as: FOSAMAX Take 70 mg by mouth once a week.   celecoxib  100 MG capsule Commonly known as: CELEBREX  Take 100 mg by mouth as needed for mild pain (pain score 1-3) or moderate pain (pain score 4-6).   fluticasone  50 MCG/ACT nasal spray Commonly known as: FLONASE  SPRAY 2 SPRAYS INTO EACH NOSTRIL EVERY DAY   hydrochlorothiazide  25 MG tablet Commonly known as: HYDRODIURIL  TAKE 1 TABLET (25 MG TOTAL) BY MOUTH DAILY.   lisinopril  40 MG tablet Commonly known as: ZESTRIL  TAKE 1 TABLET BY MOUTH EVERY DAY   rosuvastatin  20 MG tablet Commonly known as: CRESTOR  TAKE 1 TABLET BY MOUTH DAILY. GENERIC EQUIVALENT FOR CRESTOR . NEEDS AN APPOINTMENT   VITAMIN D  PO Take 2,000 Units by mouth daily.        Follow-up Information     [facility] Follow up.   Why: rep Kasi [phone] HHPT               No Known Allergies  Discharge Exam: Vitals:   10/23/23 0409 10/23/23 1224  BP: (!) 143/82 106/67  Pulse: 79 70  Resp: 18 18  Temp: (!) 97.5 F (36.4 C) (!) 97.3 F (36.3 C)  SpO2: 100% 99%    Physical Exam Vitals and nursing note reviewed.  Constitutional:      General: She is not in acute distress.    Appearance: She is not toxic-appearing.  HENT:     Head: Normocephalic and atraumatic.     Nose: Nose normal.  Cardiovascular:     Rate  and Rhythm: Normal rate and regular rhythm.  Pulmonary:     Effort: Pulmonary effort is normal.     Breath sounds: Normal breath sounds.  Abdominal:     General: Bowel sounds are normal. There is no distension.     Palpations: Abdomen is soft.  Musculoskeletal:     Right lower leg: No edema.     Left lower leg: No edema.  Skin:    General: Skin is dry.     Capillary Refill: Capillary refill takes less than 2 seconds.  Neurological:     Mental Status: She is alert and oriented to person, place, and time.     Comments: Hard of hearing     The results of significant diagnostics from this hospitalization (including imaging, microbiology, ancillary and laboratory) are listed below for reference.    Microbiology: Recent Results (from the past 240 hours)  Culture, blood (routine x 2)     Status: None (Preliminary result)   Collection Time: 10/19/23  6:04 PM   Specimen: BLOOD  Result Value Ref Range Status   Specimen Description   Final    BLOOD LEFT ANTECUBITAL Performed at [facility], [address] [place], [place] [address]    Special Requests   Final    BOTTLES DRAWN AEROBIC AND ANAEROBIC Blood Culture adequate volume Performed at [facility], [address] [place], [place] [address]    Culture   Final    NO GROWTH 3 DAYS Performed at [facility], [address] [place], [place] [address]    Report Status PENDING  Incomplete  Culture, blood (routine x 2)     Status: None (Preliminary result)   Collection Time: 10/19/23  8:26 PM   Specimen: BLOOD RIGHT FOREARM  Result Value Ref Range Status   Specimen Description   Final    BLOOD RIGHT FOREARM Performed at  [facility], [address], [place], [place] [address]    Special Requests   Final    BOTTLES DRAWN AEROBIC AND ANAEROBIC Blood Culture adequate volume Performed at [facility], [address], [place], [place] [address]    Culture    Final    NO GROWTH 2 DAYS Performed at [facility], [address] [place], [place] [address]    Report Status PENDING  Incomplete  Resp panel by RT-PCR (RSV, Flu A&B, Covid) Anterior Nasal Swab     Status: None   Collection Time: 10/19/23  9:49 PM   Specimen: Anterior Nasal Swab  Result Value Ref Range Status   SARS Coronavirus 2 by RT PCR NEGATIVE NEGATIVE Final    Comment: (NOTE) SARS-CoV-2 target nucleic acids are NOT DETECTED.  The SARS-CoV-2 RNA is generally detectable in upper respiratory specimens during the acute phase of infection. The lowest concentration of SARS-CoV-2 viral copies this assay can detect is 138 copies/mL. A negative result does not preclude SARS-Cov-2 infection and should not be used as the sole basis for treatment or other patient management decisions. A negative result may occur with  improper specimen collection/handling, submission of specimen other than nasopharyngeal swab, presence of viral mutation(s) within the areas targeted by this assay, and inadequate number of viral copies(<138 copies/mL). A negative result must be combined with clinical observations, patient history, and epidemiological information. The expected result is Negative.  Fact Sheet for Patients:  BloggerCourse.com  Fact Sheet for Healthcare Providers:  SeriousBroker.it  This test is no t yet approved or cleared by the [place]  FDA and  has been authorized for detection and/or diagnosis of SARS-CoV-2 by FDA under an Emergency Use Authorization (EUA). This EUA will remain  in effect (meaning this test can be used) for the duration of the COVID-19 declaration under Section 564(b)(1) of the Act, 21 U.S.C.section 360bbb-3(b)(1), unless the authorization is terminated  or revoked sooner.       Influenza A by PCR NEGATIVE NEGATIVE Final   Influenza B by PCR NEGATIVE NEGATIVE Final    Comment: (NOTE) The Xpert Xpress  SARS-CoV-2/FLU/RSV plus assay is intended as an aid in the diagnosis of influenza from Nasopharyngeal swab specimens and should not be used as a sole basis for treatment. Nasal washings and aspirates are unacceptable for Xpert Xpress SARS-CoV-2/FLU/RSV testing.  Fact Sheet for Patients: BloggerCourse.com  Fact Sheet for Healthcare Providers: SeriousBroker.it  This test is not yet approved or cleared by the [place]  FDA and has been authorized for detection and/or diagnosis of SARS-CoV-2 by FDA under an Emergency Use Authorization (EUA). This EUA will remain in effect (meaning this test can be used) for the duration of the COVID-19 declaration under Section 564(b)(1) of the Act, 21 U.S.C. section 360bbb-3(b)(1), unless the authorization is terminated or revoked.     Resp Syncytial Virus by PCR NEGATIVE NEGATIVE Final    Comment: (NOTE) Fact Sheet for Patients: BloggerCourse.com  Fact Sheet for Healthcare Providers: SeriousBroker.it  This test is not yet approved or cleared by the [place]  FDA and has been authorized for detection and/or diagnosis of SARS-CoV-2 by FDA under an Emergency Use Authorization (EUA). This EUA will remain in effect (meaning this test can be used) for the duration of the COVID-19 declaration under Section 564(b)(1) of the Act, 21 U.S.C. section 360bbb-3(b)(1), unless the authorization is terminated or revoked.  Performed at [facility], [address], [place], [place] [address]  Labs: Basic Metabolic Panel: Recent Labs  Lab 10/20/23 0127 10/20/23 0521 10/20/23 1104 10/21/23 0809 10/22/23 0459 10/23/23 0423  NA 132* 132* 135 135 134* 137  K 3.4* 3.2* 3.9 3.4* 3.6 3.5  CL 104 105 105 106 107 107  CO2 19* 22 19* 23 21* 23  GLUCOSE 103* 94 127* 103* 116* 103*  BUN 32* 29* 23 14 15 11   CREATININE 1.00 0.81  0.84 0.56 0.45 0.64  CALCIUM  8.2* 8.3* 8.4* 8.1* 8.5* 8.3*  MG 2.1  --   --   --   --   --   PHOS 2.6  --   --   --   --   --    Liver Function Tests: Recent Labs  Lab 10/19/23 1808 10/20/23 0521 10/21/23 0809 10/22/23 0459  AST 135* 107* 76* 56*  ALT 206* 161* 138* 114*  ALKPHOS 117 113 123 117  BILITOT 2.4* 1.6* 1.4* 0.9  PROT 6.6 5.3* 5.4* 5.3*  ALBUMIN  4.1 3.4* 3.3* 3.1*   CBC: Recent Labs  Lab 10/19/23 1808 10/20/23 0127 10/21/23 0809 10/22/23 0459 10/23/23 0423  WBC 18.0* 17.6* 18.7* 24.5* 26.2*  NEUTROABS 11.3* 12.7* 11.4* 13.7* 13.2*  HGB 12.5 10.5* 10.6* 10.2* 10.0*  HCT 37.8 32.6* 33.7* 32.9* 32.3*  MCV 85.3 86.9 90.6 90.9 92.3  PLT 30* 37* 50* 63* 76*   Cardiac Enzymes: Recent Labs  Lab 10/20/23 0521  CKTOTAL 103   CBG: Recent Labs  Lab 10/19/23 1618  GLUCAP 103*   Anemia work up Recent Labs    10/20/23 1317  VITAMINB12 582  FOLATE 9.8  FERRITIN 269  TIBC 276  IRON 47  RETICCTPCT 1.4   Urinalysis    Component Value Date/Time   COLORURINE STRAW (A) 10/22/2023 1239   APPEARANCEUR CLEAR 10/22/2023 1239   LABSPEC 1.004 (L) 10/22/2023 1239   PHURINE 5.0 10/22/2023 1239   GLUCOSEU NEGATIVE 10/22/2023 1239   HGBUR SMALL (A) 10/22/2023 1239   BILIRUBINUR NEGATIVE 10/22/2023 1239   KETONESUR NEGATIVE 10/22/2023 1239   PROTEINUR NEGATIVE 10/22/2023 1239   NITRITE NEGATIVE 10/22/2023 1239   LEUKOCYTESUR NEGATIVE 10/22/2023 1239   Sepsis Labs Recent Labs  Lab 10/20/23 0127 10/21/23 0809 10/22/23 0459 10/23/23 0423  WBC 17.6* 18.7* 24.5* 26.2*    Procedures/Studies: MR Lumbar Spine W Wo Contrast Result Date: 10/20/2023 EXAM: MR Lumbar Spine with and without intravenous contrast. 10/20/2023 02:29:10 PM TECHNIQUE: Multiplanar multisequence MRI of the lumbar spine was performed with and without the administration of intravenous contrast. COMPARISON: CT lumbar spine 10/19/2023. CLINICAL HISTORY: Myelopathy, acute, lumbar spine. FINDINGS:  BONES AND ALIGNMENT: Conventional lumbosacral anatomy is assumed with 5 non-rib-bearing, lumbar-type vertebral bodies. No abnormal enhancement. Normal alignment. SPINAL CORD: The conus terminates at L2. SOFT TISSUES: No acute abnormality. Horseshoe kidney. Moderate fatty atrophy of the paraspinal muscles. L1-L2: Small disc bulge. No spinal canal stenosis or neural foraminal narrowing. L2-L3: Small disc bulge and mild bilateral facet arthropathy. No spinal canal stenosis or neural foraminal narrowing. L3-L4: No disc herniation. No spinal canal stenosis or neural foraminal narrowing. L4-L5: Right eccentric disc bulge and facet arthropathy result in moderate right neural foraminal narrowing. L5-S1: Disc bulge with lateral disc contacting the exited left L5 nerve root in the extraforaminal zone. Mild bilateral facet arthropathy. No spinal canal stenosis. IMPRESSION: 1. Right eccentric disc bulge and facet arthropathy at L4-L5 resulting in moderate right neural foraminal narrowing. 2. Disc bulge at L5-S1 with lateral disc contacting the exited left L5 nerve root in the  extraforaminal zone. Electronically signed by: Audra Blend MD 10/20/2023 03:04 PM EDT RP Workstation: [id number]   MR HIP LEFT W WO CONTRAST Result Date: 10/20/2023 CLINICAL DATA:  Left hip pain. EXAM: MRI OF THE LEFT HIP WITHOUT AND WITH CONTRAST TECHNIQUE: Multiplanar, multisequence MR imaging was performed both before and after administration of intravenous contrast. CONTRAST:  7mL GADAVIST GADOBUTROL 1 MMOL/ML IV SOLN COMPARISON:  Left hip radiographs and CT abdomen/pelvis dated 10/19/2023. FINDINGS: Soft tissue and Muscle: Susceptibility artifact related to indwelling orthopedic hardware in the left hip limits evaluation of the surrounding soft tissues. There is no obvious soft tissue swelling. Muscle bulk is age-appropriate. Hamstring tendon origins are intact. Left gluteal cuff insertions are not well evaluated due to significant susceptibility  artifact. Visualized intrapelvic contents are grossly unremarkable. Bones/ Hip: Status post ORIF of the left hip with associated susceptibility artifact which limits evaluation of the surrounding bone. Chronic posttraumatic deformity of the left proximal femur with areas of heterotopic ossification adjacent to the left greater trochanter. No appreciable joint effusion. No periarticular collection identified. The labrum is suboptimally evaluated on this exam. No discrete marrow signal abnormality is identified. No evidence of acute fracture or dislocation. Sacroiliac joints and pubic symphysis are anatomically aligned with degenerative changes. Degenerative changes of the visualized lower lumbar spine. IMPRESSION: Status post ORIF of the left proximal femur with associated susceptibility artifact, which limits evaluation of the surrounding bone and soft tissues. Similar chronic posttraumatic deformity of the left proximal femur. No joint effusion or periarticular collection identified. No acute osseous abnormality. Electronically Signed   By: Mannie Seek M.D.   On: 10/20/2023 15:01   DG Hip Unilat W or Wo Pelvis 2-3 Views Left Result Date: 10/19/2023 EXAM: 2 or 3 VIEW(S) XRAY OF THE LEFT HIP 10/19/2023 10:22:00 PM COMPARISON: None available. CLINICAL HISTORY: Pain. Pt reports with weakness and not being able to eat since having her hip injection on the 23rd of May. Pt reports feeling dehydrated. FINDINGS: BONES AND JOINTS: Stable ORIF of the left hip with deformity. No evidence of hardware complication. Mild degenerative change of the right hip. Mild degenerative changes of the lower lumbar spine. SOFT TISSUES: The soft tissues are unremarkable. IMPRESSION: 1. Stable ORIF of the left hip. No evidence of complication. Electronically signed by: Zadie Herter MD 10/19/2023 10:28 PM EDT RP Workstation: [id number]   CT L-SPINE NO CHARGE Result Date: 10/19/2023 CLINICAL DATA:  Pt reports with weakness and not  being able to eat since having her hip injection on the 23rd of May. Pt reports feeling dehydrated. EXAM: CT LUMBAR SPINE WITHOUT CONTRAST TECHNIQUE: Multidetector CT imaging of the lumbar spine was performed without intravenous contrast administration. Multiplanar CT image reconstructions were also generated. RADIATION DOSE REDUCTION: This exam was performed according to the departmental dose-optimization program which includes automated exposure control, adjustment of the mA and/or kV according to patient size and/or use of iterative reconstruction technique. COMPARISON:  MRI lumbar spine 09/09/2023 FINDINGS: Segmentation: 5 lumbar type vertebrae. Alignment: Normal. Vertebrae: Multilevel at least moderate degenerative changes of the spine. Chronic stable anterior wedge compression fracture of the T12 and L1 vertebral bodies. No severe osseous foraminal or central canal stenosis. No acute fracture or focal pathologic process. Paraspinal and other soft tissues: Negative. Disc levels: Multilevel intervertebral disc space vacuum phenomenon. Other: At least moderate atherosclerotic plaque. Horseshoe kidney. Subcentimeter hypodensity within the right kidney too small to characterize-no further follow-up indicated. IMPRESSION: 1. No acute displaced fracture or traumatic listhesis of the lumbar spine.  2.  Aortic Atherosclerosis (ICD10-I70.0). Electronically Signed   By: Morgane  Naveau M.D.   On: 10/19/2023 20:10   CT ABDOMEN PELVIS W CONTRAST Result Date: 10/19/2023 CLINICAL DATA:  Abdominal pain.  Elevated LFTs. EXAM: CT ABDOMEN AND PELVIS WITH CONTRAST TECHNIQUE: Multidetector CT imaging of the abdomen and pelvis was performed using the standard protocol following bolus administration of intravenous contrast. RADIATION DOSE REDUCTION: This exam was performed according to the departmental dose-optimization program which includes automated exposure control, adjustment of the mA and/or kV according to patient size  and/or use of iterative reconstruction technique. CONTRAST:  OMNIPAQUE  IOHEXOL  300 MG/ML  SOLN COMPARISON:  None Available. FINDINGS: Lower chest: There is scarring in the lung bases. Hepatobiliary: There is diffuse fatty infiltration of the liver. Is 6 cm hypodensity right lobe of the liver which is too small to characterize, possibly a hemangioma. Other etiologies cannot be excluded. Gallbladder and bile ducts are within normal limits. Pancreas: Unremarkable. No pancreatic ductal dilatation or surrounding inflammatory changes. Spleen: Normal in size without focal abnormality. Adrenals/Urinary Tract: Horseshoe kidney present. No hydronephrosis or perinephric fat stranding. There is a subcentimeter hypodensity in the right kidney which is too small to characterize, likely a cyst. Adrenal glands and bladder are within normal limits. Stomach/Bowel: Stomach is within normal limits. Appendix appears normal. No evidence of bowel wall thickening, distention, or inflammatory changes. There is a rounded fat attenuation area in the ascending colon measuring up to 2.6 cm, likely lipoma. Vascular/Lymphatic: Aortic atherosclerosis. No enlarged abdominal or pelvic lymph nodes. Reproductive: Uterus and bilateral adnexa are unremarkable. Other: No abdominal wall hernia or abnormality. No abdominopelvic ascites. Musculoskeletal: Left hip screw is present. Degenerative changes affect the spine. IMPRESSION: 1. No acute localizing process in the abdomen or pelvis. 2. Fatty infiltration of the liver. 3. Horseshoe kidney. 4. 2.6 cm lipoma in the ascending colon. 5. Aortic atherosclerosis. Aortic Atherosclerosis (ICD10-I70.0). Electronically Signed   By: Tyron Gallon M.D.   On: 10/19/2023 20:03   CT Head Wo Contrast Result Date: 10/19/2023 CLINICAL DATA:  Mental status change, unknown cause. EXAM: CT HEAD WITHOUT CONTRAST TECHNIQUE: Contiguous axial images were obtained from the base of the skull through the vertex without  intravenous contrast. RADIATION DOSE REDUCTION: This exam was performed according to the departmental dose-optimization program which includes automated exposure control, adjustment of the mA and/or kV according to patient size and/or use of iterative reconstruction technique. COMPARISON:  None Available. FINDINGS: Brain: No acute intracranial hemorrhage, midline shift, or mass effect is seen. Mild atrophy is noted. There is no extra-axial fluid collection. Gray-white matter differentiation is within normal limits. No hydrocephalus. Vascular: No hyperdense vessel or unexpected calcification. Skull: Normal. Negative for fracture or focal lesion. Sinuses/Orbits: There is partial opacification of the ethmoid air cells bilaterally. The orbits are within normal limits. Other: None. IMPRESSION: No acute intracranial process. Electronically Signed   By: Wyvonnia Heimlich M.D.   On: 10/19/2023 17:33   DG Chest Portable 1 View Result Date: 10/19/2023 CLINICAL DATA:  Altered mental status.  Weakness. EXAM: PORTABLE CHEST 1 VIEW COMPARISON:  05/08/2022. FINDINGS: The heart is enlarged and mediastinal contours are within normal limits. No consolidation, effusion, or pneumothorax. No acute osseous abnormality. IMPRESSION: No active disease. Electronically Signed   By: Wyvonnia Heimlich M.D.   On: 10/19/2023 17:29    Time coordinating discharge: 60 mins  SIGNED:  Unk Garb, DO [facility] 10/23/23, 12:31 PM

## 2023-10-23 NOTE — Progress Notes (Addendum)
 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 Scherrie Curt oncology, IBD, diverticulosis, hemorrhoids admitted with AKI found to have: #Worsening Chronic leukocytosis #Left hip pain with history of left ORIF->resolved #medrol dose pack #N/V->improving. Denies diarrhea   -Patient underwent left hip injection around 6/23 for hip pain she notes that a few days later she started having nausea and vomiting with subjective fevers.  Her hip pain and symptoms has not improved.  Presented the ED where she was found to have AKI, WBC 18K.  She was started on cefepime and vancomycin. -Patient has had CT and MR lumbar spine which did not reveal infection.  MR left hip which also did not show any infection.  CT abdomen pelvis does not show acute process. -Oncology following this suspect her findings are consistent inflammation and infectious process worsening chronic leukocytosis/thrombocytopenia     Recommendations:  -Abx stopped yesterday. Wbc 26l(24.5 on 6/3)->rate of rise declined. Pt did not take her medrol dose pack. No further abx needed. Continue to follow with heme/onc for chronic leukocytosis/thrombocytopenia. Suspect wbc increase in the setting of viral illness.  -ID will SO -Discussed with priamry   Evaluation of this patient requires complex antimicrobial therapy evaluation and counseling + isolation needs for disease transmission risk assessment and mitigation   Microbiology:   Antibiotics: Cefepime 5/31- Vanc 6/1- Cultures: Blood 5/31 ng   SUBJECTIVE: Retin in bed. Daughter at bedside Interval:  Afebrile overnight. Wbc 26.2k  Review of Systems: Review of Systems  All other systems reviewed and are negative.    Scheduled Meds:  acetaminophen   1,000 mg Oral TID   cholecalciferol  2,000 Units Oral Daily   feeding supplement  237 mL Oral BID BM   Continuous Infusions: PRN Meds:.oxyCODONE  No Known Allergies  OBJECTIVE: Vitals:   10/22/23 1319 10/22/23 1534 10/22/23 2019 10/23/23 0409  BP: (!) 145/83 137/74 131/71 (!) 143/82  Pulse: 80 87 82 79  Resp: 16 16 17 18   Temp: (!) 97.3 F (36.3 C) 97.6 F (36.4 C) (!) 97.3 F (36.3 C) (!) 97.5 F (36.4 C)  TempSrc: Oral Oral Oral   SpO2: 98% 100% 99% 100%  Weight:      Height:       Body mass index is 34.1 kg/m.  Physical Exam Constitutional:      Appearance: Normal appearance.  HENT:     Head: Normocephalic and atraumatic.     Right Ear: Tympanic membrane normal.     Left Ear: Tympanic membrane normal.     Nose: Nose normal.     Mouth/Throat:     Mouth: Mucous membranes are moist.  Eyes:     Extraocular Movements: Extraocular movements intact.     Conjunctiva/sclera: Conjunctivae normal.     Pupils: Pupils are equal, round, and reactive to light.  Cardiovascular:     Rate and Rhythm: Normal rate and regular rhythm.     Heart sounds: No murmur heard.    No friction rub. No gallop.  Pulmonary:     Effort: Pulmonary effort is normal.  Breath sounds: Normal breath sounds.  Abdominal:     General: Abdomen is flat.     Palpations: Abdomen is soft.  Skin:    General: Skin is warm and dry.  Neurological:     General: No focal deficit present.     Mental Status: She is alert and oriented to person, place, and time.  Psychiatric:        Mood and Affect: Mood normal.       Lab Results Lab Results  Component Value Date   WBC 26.2 (H) 10/23/2023   HGB 10.0 (L) 10/23/2023   HCT 32.3 (L) 10/23/2023   MCV 92.3 10/23/2023   PLT 76 (L) 10/23/2023    Lab Results  Component Value Date   CREATININE 0.64  10/23/2023   BUN 11 10/23/2023   NA 137 10/23/2023   K 3.5 10/23/2023   CL 107 10/23/2023   CO2 23 10/23/2023    Lab Results  Component Value Date   ALT 114 (H) 10/22/2023   AST 56 (H) 10/22/2023   ALKPHOS 117 10/22/2023   BILITOT 0.9 10/22/2023        Orlie Bjornstad, MD [facility] 10/23/2023, 12:19 PM

## 2023-10-23 NOTE — Subjective & Objective (Signed)
 Pt seen and examined. Pt is Hard of hearing.  Hx of Tuner's syndrome(karotype of XO). Pt able to walk 300 feet today with mobility tech. Discussed with ID and heme/onc. Pt is cleared for discharge from both services. Abx were completed yesterday. Pt to f/u with hematology for workup of her chronic leukocytosis and thrombocytopenia.

## 2023-10-23 NOTE — Assessment & Plan Note (Signed)
 10-23-2023 pt seen by heme/onc. Pt has had bone marrow biopsy.  Plt count up to 76K. Pt to f/u with heme/onc as outpatient.

## 2023-10-23 NOTE — Assessment & Plan Note (Signed)
 10-23-2023 AKI has resolved with IVF. Likely due to poor po intake, hydrochlorothiazide  use

## 2023-10-23 NOTE — Plan of Care (Signed)

## 2023-10-23 NOTE — Progress Notes (Signed)
Discharge instructions given to patient. D Akirah Storck RN 

## 2023-10-23 NOTE — Assessment & Plan Note (Signed)
 10-23-2023 stable. Home with ACEI/HCTZ

## 2023-10-23 NOTE — Assessment & Plan Note (Signed)
 10-23-2023 10-23-2023 pt seen by heme/onc. Pt has had bone marrow biopsy in the past.  WBC 26K. ID does not think pt is infected. Will not discharge on abx. F/u with heme/onc.

## 2023-10-23 NOTE — Progress Notes (Signed)
 Mobility Specialist - Progress Note   10/23/23 1100  Mobility  Activity Ambulated with assistance in hallway  Level of Assistance Modified independent, requires aide device or extra time  Assistive Device None  Distance Ambulated (ft) 350 ft  Range of Motion/Exercises Active  Activity Response Tolerated well  Mobility Referral Yes  Mobility visit 1 Mobility  Mobility Specialist Start Time (ACUTE ONLY) 1052  Mobility Specialist Stop Time (ACUTE ONLY) 1101  Mobility Specialist Time Calculation (min) (ACUTE ONLY) 9 min   Received in chair and agreed to mobility. No hands on during entire session. No issues with ambulating. Returned to chair with all needs met.  [name] Mobility Specialist

## 2023-10-23 NOTE — Assessment & Plan Note (Signed)
 10-23-2023 last LFT from 10-22-2023. Show AST 56, ALT 115. Alk phos 117, T. Bili 0.9. f/u with PCP to have them rechecked.

## 2023-10-23 NOTE — Hospital Course (Signed)
 HPI:  [name] is a 68 y.o. female with history of hypertension, hyperlipidemia, inflammatory bowel disease who has recently been following oncologist Dr. Coni Deep for thrombocytopenia has had bone marrow biopsy which has not shown any definite cause for the thrombocytopenia has been having chronic low back pain.  Pain is mostly on the left side.  Had followed up with the orthopedic surgeon about 10 days ago when she had an injection on the left hip area.  Following that patient has become progressively weak and had at least 2 or 3 episodes of nausea vomiting poor appetite subjective feeling of fever chills and due to persistent symptoms patient presents to the ER.  About 3 days ago patient had mild self-limited epistaxis.   ED Course: In the ER patient is afebrile.  Labs show leukocytosis of 18,000 creatinine of 1.2 which increased from baseline.  Sodium 128.  CT L-spine did not show anything acute x-ray of the hip did not show any fracture.  Platelets  further decreased and it is around 30.  ER physician started patient on emptying of antibiotics after blood cultures were obtained.  Discussed with on-call hematologist about the platelets further decreased and at this time hematologist recommended they will be seeing patient in consult in the morning and to follow CBC.  Significant Events: Admitted 10/19/2023 for ARF with hyponatremia   Admission Labs: WBC 18, HgB 12.5, plt 30 Na 128, K 3.5, CO2 of 23, BUN 42, Scr 1.25, glu 102 UA spg 1.027, neg nitrite, trace LE, WBC 0-5, RBC 0-5, no bacteria Covid/flu/rsv negative Hep B Surface Ag, Hep C Ab neg, Hep A IgM neg, Hep B IgM neg CK 103 T prot 5.3, K 3.4, AST 107, ALT 161, alk phos 113, T. Bili 1.6 Ferritin 269 Iron 47, TIBC 276 Vit B12 582 Folate 9.8 CRP 1.9, ESR 0  Admission Imaging Studies:   Significant Labs: HIV NR TSH 1.483  Significant Imaging Studies:   Antibiotic Therapy: Anti-infectives (From admission, onward)     Start     Dose/Rate Route Frequency Ordered Stop   10/21/23 2200  vancomycin (VANCOCIN) IVPB 1000 mg/200 mL premix  Status:  Discontinued        1,000 mg 200 mL/hr over 60 Minutes Intravenous Every 48 hours 10/20/23 0058 10/20/23 0816   10/20/23 2200  vancomycin (VANCOREADY) IVPB 750 mg/150 mL  Status:  Discontinued        750 mg 150 mL/hr over 60 Minutes Intravenous Every 24 hours 10/20/23 0816 10/22/23 1429   10/20/23 1000  ceFEPIme (MAXIPIME) 2 g in sodium chloride  0.9 % 100 mL IVPB        2 g 200 mL/hr over 30 Minutes Intravenous Every 12 hours 10/20/23 0055 10/22/23 2157   10/19/23 2115  ceFEPIme (MAXIPIME) 2 g in sodium chloride  0.9 % 100 mL IVPB        2 g 200 mL/hr over 30 Minutes Intravenous  Once 10/19/23 2106 10/19/23 2315   10/19/23 2115  metroNIDAZOLE (FLAGYL) IVPB 500 mg        500 mg 100 mL/hr over 60 Minutes Intravenous  Once 10/19/23 2106 10/19/23 2315   10/19/23 2115  vancomycin (VANCOCIN) IVPB 1000 mg/200 mL premix        1,000 mg 200 mL/hr over 60 Minutes Intravenous  Once 10/19/23 2106 10/19/23 2315       Procedures:   Consultants: Heme/onc - Rosaline Coma ID  - Zelda Hickman

## 2023-10-24 ENCOUNTER — Telehealth: Payer: Self-pay

## 2023-10-24 LAB — CULTURE, BLOOD (ROUTINE X 2)
Culture: NO GROWTH
Special Requests: ADEQUATE

## 2023-10-24 LAB — PATHOLOGIST SMEAR REVIEW

## 2023-10-24 NOTE — Transitions of Care (Post Inpatient/ED Visit) (Signed)
   10/24/2023  Name: [name] MRN: [id number] DOB: [birth year]  Today's TOC FU Call Status: Today's TOC FU Call Status:: Successful TOC FU Call Completed Patient's Name and Date of Birth confirmed.  Transition Care Management Follow-up Telephone Call Date of Discharge: 10/23/23 Discharge Facility: [facility]) Type of Discharge: Inpatient Admission How have you been since you were released from the hospital?: Better Any questions or concerns?: Yes Patient Questions/Concerns:: has questions on medication. Patient Questions/Concerns Addressed: Other: (inform pt she can go over her med concerns on medication at her f/u appt.)  Items Reviewed: Did you receive and understand the discharge instructions provided?: Yes Medications obtained,verified, and reconciled?: Yes (Medications Reviewed) Any new allergies since your discharge?: No Dietary orders reviewed?: No Do you have support at home?: Yes People in Home [RPT]: child(ren), adult, spouse  Medications Reviewed Today: Medications Reviewed Today     Reviewed by Leron Stoffers, CMA (Certified Medical Assistant) on 10/24/23 at 1030  Med List Status: <None>   Medication Order Taking? Sig Documenting Provider Last Dose Status Informant  alendronate (FOSAMAX) 70 MG tablet [id number] Yes Take 70 mg by mouth once a week. [provider] Taking Active Self, Pharmacy Records  celecoxib  (CELEBREX ) 100 MG capsule [id number] Yes Take 100 mg by mouth as needed for mild pain (pain score 1-3) or moderate pain (pain score 4-6). [provider] Taking Active Self, Pharmacy Records  fluticasone  (FLONASE ) 50 MCG/ACT nasal spray [id number] No SPRAY 2 SPRAYS INTO EACH NOSTRIL EVERY DAY  Patient not taking: Reported on 10/24/2023   Aida House, MD Not Taking Active Self, Pharmacy Records  hydrochlorothiazide  (HYDRODIURIL ) 25 MG tablet [id number] Yes TAKE 1 TABLET (25 MG TOTAL) BY MOUTH DAILY. Aida House, MD Taking Active  Self, Pharmacy Records  lisinopril  (ZESTRIL ) 40 MG tablet [id number] Yes TAKE 1 TABLET BY MOUTH EVERY DAY Aida House, MD Taking Active Self, Pharmacy Records  rosuvastatin  (CRESTOR ) 20 MG tablet [id number] Yes TAKE 1 TABLET BY MOUTH DAILY. GENERIC EQUIVALENT FOR CRESTOR . NEEDS AN APPOINTMENT Aida House, MD Taking Active Self, Pharmacy Records  VITAMIN D  PO [id number] Yes Take 2,000 Units by mouth daily. [provider] Taking Active Self, Pharmacy Records            Home Care and Equipment/Supplies: Were Home Health Services Ordered?: NA Any new equipment or medical supplies ordered?: No  Functional Questionnaire: Do you need assistance with bathing/showering or dressing?: No Do you need assistance with meal preparation?: No Do you need assistance with eating?: No Do you have difficulty maintaining continence: No Do you need assistance with getting out of bed/getting out of a chair/moving?: No Do you have difficulty managing or taking your medications?: No  Follow up appointments reviewed: PCP Follow-up appointment confirmed?: Yes Date of PCP follow-up appointment?: 11/05/23 Follow-up Provider: PCP- Dr. Felice Hora Follow-up appointment confirmed?: NA Do you need transportation to your follow-up appointment?: No    SIGNATURE: Rahma Meller, CMA

## 2023-10-25 DIAGNOSIS — Z9089 Acquired absence of other organs: Secondary | ICD-10-CM | POA: Diagnosis not present

## 2023-10-25 DIAGNOSIS — E785 Hyperlipidemia, unspecified: Secondary | ICD-10-CM | POA: Diagnosis not present

## 2023-10-25 DIAGNOSIS — H9190 Unspecified hearing loss, unspecified ear: Secondary | ICD-10-CM | POA: Diagnosis not present

## 2023-10-25 DIAGNOSIS — E871 Hypo-osmolality and hyponatremia: Secondary | ICD-10-CM | POA: Diagnosis not present

## 2023-10-25 DIAGNOSIS — D72829 Elevated white blood cell count, unspecified: Secondary | ICD-10-CM | POA: Diagnosis not present

## 2023-10-25 DIAGNOSIS — E872 Acidosis, unspecified: Secondary | ICD-10-CM | POA: Diagnosis not present

## 2023-10-25 DIAGNOSIS — E538 Deficiency of other specified B group vitamins: Secondary | ICD-10-CM | POA: Diagnosis not present

## 2023-10-25 DIAGNOSIS — K219 Gastro-esophageal reflux disease without esophagitis: Secondary | ICD-10-CM | POA: Diagnosis not present

## 2023-10-25 DIAGNOSIS — M549 Dorsalgia, unspecified: Secondary | ICD-10-CM | POA: Diagnosis not present

## 2023-10-25 DIAGNOSIS — K5790 Diverticulosis of intestine, part unspecified, without perforation or abscess without bleeding: Secondary | ICD-10-CM | POA: Diagnosis not present

## 2023-10-25 DIAGNOSIS — N179 Acute kidney failure, unspecified: Secondary | ICD-10-CM | POA: Diagnosis not present

## 2023-10-25 DIAGNOSIS — D696 Thrombocytopenia, unspecified: Secondary | ICD-10-CM | POA: Diagnosis not present

## 2023-10-25 DIAGNOSIS — Z860100 Personal history of colon polyps, unspecified: Secondary | ICD-10-CM | POA: Diagnosis not present

## 2023-10-25 DIAGNOSIS — Z7952 Long term (current) use of systemic steroids: Secondary | ICD-10-CM | POA: Diagnosis not present

## 2023-10-25 DIAGNOSIS — M8580 Other specified disorders of bone density and structure, unspecified site: Secondary | ICD-10-CM | POA: Diagnosis not present

## 2023-10-25 DIAGNOSIS — I10 Essential (primary) hypertension: Secondary | ICD-10-CM | POA: Diagnosis not present

## 2023-10-25 LAB — CULTURE, BLOOD (ROUTINE X 2)
Culture: NO GROWTH
Special Requests: ADEQUATE

## 2023-10-28 ENCOUNTER — Telehealth: Payer: Self-pay

## 2023-10-28 NOTE — Telephone Encounter (Signed)
 I would say as long as it's 120 or less then it's ok to perform PT-- she needs to follow up with me in the office to discuss

## 2023-10-28 NOTE — Telephone Encounter (Signed)
 Ok to send verbal orders, she usually runs and elevated heart rate

## 2023-10-28 NOTE — Telephone Encounter (Signed)
 Spoke with [name] and informed her of the approval and heart rate information as below.   She requested a resting heart rate parameter as this is needed for PT to continue.  Message sent to PCP.

## 2023-10-28 NOTE — Telephone Encounter (Signed)
 Copied from CRM [id number]. Topic: Clinical - Home Health Verbal Orders >> Oct 28, 2023  3:09 PM Artemio Larry wrote: Caller/Agency: Trenia Fritter from [facility]  Callback Number: [phone] Service Requested: Physical Therapy Frequency: 1X FOR 1 WEEK, 2X FOR TWO WEEKS, 1X FOR 5 WEEKS, starting June 6,2025 Any new concerns about the patient? Yes, heart resting rate 120 but patient says it runs like that at the hospital. Denies chest pain, headache or any other symptoms.

## 2023-10-29 DIAGNOSIS — E871 Hypo-osmolality and hyponatremia: Secondary | ICD-10-CM | POA: Diagnosis not present

## 2023-10-29 DIAGNOSIS — K5790 Diverticulosis of intestine, part unspecified, without perforation or abscess without bleeding: Secondary | ICD-10-CM | POA: Diagnosis not present

## 2023-10-29 DIAGNOSIS — M8580 Other specified disorders of bone density and structure, unspecified site: Secondary | ICD-10-CM | POA: Diagnosis not present

## 2023-10-29 DIAGNOSIS — D72829 Elevated white blood cell count, unspecified: Secondary | ICD-10-CM | POA: Diagnosis not present

## 2023-10-29 DIAGNOSIS — M549 Dorsalgia, unspecified: Secondary | ICD-10-CM | POA: Diagnosis not present

## 2023-10-29 DIAGNOSIS — Z9089 Acquired absence of other organs: Secondary | ICD-10-CM | POA: Diagnosis not present

## 2023-10-29 DIAGNOSIS — E785 Hyperlipidemia, unspecified: Secondary | ICD-10-CM | POA: Diagnosis not present

## 2023-10-29 DIAGNOSIS — D696 Thrombocytopenia, unspecified: Secondary | ICD-10-CM | POA: Diagnosis not present

## 2023-10-29 DIAGNOSIS — N179 Acute kidney failure, unspecified: Secondary | ICD-10-CM | POA: Diagnosis not present

## 2023-10-29 DIAGNOSIS — H9190 Unspecified hearing loss, unspecified ear: Secondary | ICD-10-CM | POA: Diagnosis not present

## 2023-10-29 DIAGNOSIS — E872 Acidosis, unspecified: Secondary | ICD-10-CM | POA: Diagnosis not present

## 2023-10-29 DIAGNOSIS — K219 Gastro-esophageal reflux disease without esophagitis: Secondary | ICD-10-CM | POA: Diagnosis not present

## 2023-10-29 DIAGNOSIS — Z7952 Long term (current) use of systemic steroids: Secondary | ICD-10-CM | POA: Diagnosis not present

## 2023-10-29 DIAGNOSIS — I10 Essential (primary) hypertension: Secondary | ICD-10-CM | POA: Diagnosis not present

## 2023-10-29 DIAGNOSIS — Z860100 Personal history of colon polyps, unspecified: Secondary | ICD-10-CM | POA: Diagnosis not present

## 2023-10-29 DIAGNOSIS — E538 Deficiency of other specified B group vitamins: Secondary | ICD-10-CM | POA: Diagnosis not present

## 2023-10-29 NOTE — Telephone Encounter (Signed)
 [name] was informed of the message below.

## 2023-10-30 ENCOUNTER — Other Ambulatory Visit: Payer: Self-pay

## 2023-10-30 DIAGNOSIS — D696 Thrombocytopenia, unspecified: Secondary | ICD-10-CM

## 2023-10-31 ENCOUNTER — Encounter: Payer: Self-pay | Admitting: Oncology

## 2023-10-31 ENCOUNTER — Inpatient Hospital Stay

## 2023-10-31 ENCOUNTER — Inpatient Hospital Stay: Attending: Nurse Practitioner | Admitting: Oncology

## 2023-10-31 VITALS — BP 141/80 | HR 101 | Temp 97.7°F | Resp 18 | Wt 156.1 lb

## 2023-10-31 DIAGNOSIS — M549 Dorsalgia, unspecified: Secondary | ICD-10-CM | POA: Diagnosis not present

## 2023-10-31 DIAGNOSIS — Z7952 Long term (current) use of systemic steroids: Secondary | ICD-10-CM | POA: Diagnosis not present

## 2023-10-31 DIAGNOSIS — R531 Weakness: Secondary | ICD-10-CM | POA: Insufficient documentation

## 2023-10-31 DIAGNOSIS — E785 Hyperlipidemia, unspecified: Secondary | ICD-10-CM | POA: Diagnosis not present

## 2023-10-31 DIAGNOSIS — D72829 Elevated white blood cell count, unspecified: Secondary | ICD-10-CM | POA: Diagnosis not present

## 2023-10-31 DIAGNOSIS — E871 Hypo-osmolality and hyponatremia: Secondary | ICD-10-CM | POA: Diagnosis not present

## 2023-10-31 DIAGNOSIS — Z9089 Acquired absence of other organs: Secondary | ICD-10-CM | POA: Diagnosis not present

## 2023-10-31 DIAGNOSIS — I10 Essential (primary) hypertension: Secondary | ICD-10-CM | POA: Diagnosis not present

## 2023-10-31 DIAGNOSIS — D696 Thrombocytopenia, unspecified: Secondary | ICD-10-CM | POA: Insufficient documentation

## 2023-10-31 DIAGNOSIS — K5790 Diverticulosis of intestine, part unspecified, without perforation or abscess without bleeding: Secondary | ICD-10-CM | POA: Diagnosis not present

## 2023-10-31 DIAGNOSIS — D72821 Monocytosis (symptomatic): Secondary | ICD-10-CM | POA: Diagnosis not present

## 2023-10-31 DIAGNOSIS — H9190 Unspecified hearing loss, unspecified ear: Secondary | ICD-10-CM | POA: Diagnosis not present

## 2023-10-31 DIAGNOSIS — Z860100 Personal history of colon polyps, unspecified: Secondary | ICD-10-CM | POA: Diagnosis not present

## 2023-10-31 DIAGNOSIS — M8580 Other specified disorders of bone density and structure, unspecified site: Secondary | ICD-10-CM | POA: Diagnosis not present

## 2023-10-31 DIAGNOSIS — K219 Gastro-esophageal reflux disease without esophagitis: Secondary | ICD-10-CM | POA: Diagnosis not present

## 2023-10-31 DIAGNOSIS — N179 Acute kidney failure, unspecified: Secondary | ICD-10-CM | POA: Diagnosis not present

## 2023-10-31 DIAGNOSIS — E872 Acidosis, unspecified: Secondary | ICD-10-CM | POA: Diagnosis not present

## 2023-10-31 DIAGNOSIS — E538 Deficiency of other specified B group vitamins: Secondary | ICD-10-CM | POA: Diagnosis not present

## 2023-10-31 LAB — CMP (CANCER CENTER ONLY)
ALT: 53 U/L — ABNORMAL HIGH (ref 0–44)
AST: 39 U/L (ref 15–41)
Albumin: 4.6 g/dL (ref 3.5–5.0)
Alkaline Phosphatase: 123 U/L (ref 38–126)
Anion gap: 15 (ref 5–15)
BUN: 12 mg/dL (ref 8–23)
CO2: 22 mmol/L (ref 22–32)
Calcium: 10 mg/dL (ref 8.9–10.3)
Chloride: 103 mmol/L (ref 98–111)
Creatinine: 1.12 mg/dL — ABNORMAL HIGH (ref 0.44–1.00)
GFR, Estimated: 54 mL/min — ABNORMAL LOW (ref >60)
Glucose, Bld: 121 mg/dL — ABNORMAL HIGH (ref 70–99)
Potassium: 4.1 mmol/L (ref 3.5–5.1)
Sodium: 139 mmol/L (ref 135–145)
Total Bilirubin: 1.1 mg/dL (ref 0.0–1.2)
Total Protein: 6.9 g/dL (ref 6.5–8.1)

## 2023-10-31 LAB — CBC WITH DIFFERENTIAL (CANCER CENTER ONLY)
Abs Immature Granulocytes: 0.1 10*3/uL — ABNORMAL HIGH (ref 0.00–0.07)
Basophils Absolute: 0 10*3/uL (ref 0.0–0.1)
Basophils Relative: 0 %
Eosinophils Absolute: 0 10*3/uL (ref 0.0–0.5)
Eosinophils Relative: 0 %
HCT: 37.5 % (ref 36.0–46.0)
Hemoglobin: 11.8 g/dL — ABNORMAL LOW (ref 12.0–15.0)
Lymphocytes Relative: 26 %
Lymphs Abs: 2.2 10*3/uL (ref 0.7–4.0)
MCH: 28.2 pg (ref 26.0–34.0)
MCHC: 31.5 g/dL (ref 30.0–36.0)
MCV: 89.7 fL (ref 80.0–100.0)
Monocytes Absolute: 2.4 10*3/uL — ABNORMAL HIGH (ref 0.1–1.0)
Monocytes Relative: 28 %
Neutro Abs: 3.9 10*3/uL (ref 1.7–7.7)
Neutrophils Relative %: 45 %
Platelet Count: 92 10*3/uL — ABNORMAL LOW (ref 150–400)
Promyelocytes Relative: 1 %
RBC: 4.18 MIL/uL (ref 3.87–5.11)
RDW: 15 % (ref 11.5–15.5)
WBC Count: 8.6 10*3/uL (ref 4.0–10.5)
nRBC: 0 % (ref 0.0–0.2)

## 2023-10-31 NOTE — Progress Notes (Signed)
  [facility] OFFICE PROGRESS NOTE   Diagnosis: Thrombocytopenia, monocytosis  INTERVAL HISTORY:   [name] returns for a scheduled visit.  She was admitted 10/19/2023 with acute onset nausea/vomiting and generalized weakness.  She was treated for dehydration.  The liver enzymes were elevated.  She had leukocytosis and the platelet count dropped to 30,000.  She was seen by Dr. Rosaline Coma in the hospital.  The symptoms occurred approximately 3 days after receiving a hip injection.  No source for infection was identified.  She was seen by the infectious disease service and was suspected of having a viral GI illness. She complains of persistent leg weakness.  She is participating in physical therapy.  The mouth is dry.  She reports intentional weight loss.  Objective:  Vital signs in last 24 hours:  Blood pressure (!) 141/80, pulse (!) 113, temperature 97.7 F (36.5 C), temperature source Temporal, resp. rate 18, weight 156 lb 1.6 oz (70.8 kg), SpO2 96%.    HEENT: No thrush or ulcers Lymphatics: No cervical, supraclavicular, axillary, or inguinal nodes Resp: Distant breath sounds, no respiratory distress Cardio: Regular rate and rhythm GI: No hepatosplenomegaly Vascular: The left lower leg is slightly larger than the right side, no edema  Lab Results:  Lab Results  Component Value Date   WBC 8.6 10/31/2023   HGB 11.8 (L) 10/31/2023   HCT 37.5 10/31/2023   MCV 89.7 10/31/2023   PLT 92 (L) 10/31/2023   NEUTROABS 3.9 10/31/2023    CMP  Lab Results  Component Value Date   NA 139 10/31/2023   K 4.1 10/31/2023   CL 103 10/31/2023   CO2 22 10/31/2023   GLUCOSE 121 (H) 10/31/2023   BUN 12 10/31/2023   CREATININE 1.12 (H) 10/31/2023   CALCIUM  10.0 10/31/2023   PROT 6.9 10/31/2023   ALBUMIN  4.6 10/31/2023   AST 39 10/31/2023   ALT 53 (H) 10/31/2023   ALKPHOS 123 10/31/2023   BILITOT 1.1 10/31/2023   GFRNONAA 54 (L) 10/31/2023   GFRAA >90 05/07/2012      Medications: I have reviewed the patient's current medications.   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 07/15/2023 abdominal ultrasound-unremarkable ultrasound of the spleen. Bone marrow biopsy 08/13/2023: Mildly hypercellular marrow, no increase in blast, reactive lymphoid aggregates, flow cytometry without a clonal B-cell population, cytogenetics-45, X,-X History of tachycardia Hypertension Hypercholesterolemia Chronic low back/left leg pain Admission 10/19/2023 with acute onset nausea/vomiting, subjective fever, and generalized weakness-acute GI viral illness suspected     Disposition: [name] has chronic monocytosis and thrombocytopenia.  She appears stable from a hematologic standpoint today.  The platelet count was lower and she developed acute leukocytosis while admitted.  The white is lower today and the platelet count has recovered to baseline. I continue to have a low clinical suspicion for hematopoietic malignancy.  The plan is to continue observation.  She will call for bleeding.  She will return for an office and lab visit in 2 months.  She will follow-up with Dr. Bambi Lever for evaluation of generalized weakness and dry mouth. Coni Deep, MD  10/31/2023  3:34 PM

## 2023-11-04 DIAGNOSIS — M549 Dorsalgia, unspecified: Secondary | ICD-10-CM | POA: Diagnosis not present

## 2023-11-04 DIAGNOSIS — Z860100 Personal history of colon polyps, unspecified: Secondary | ICD-10-CM | POA: Diagnosis not present

## 2023-11-04 DIAGNOSIS — H9190 Unspecified hearing loss, unspecified ear: Secondary | ICD-10-CM | POA: Diagnosis not present

## 2023-11-04 DIAGNOSIS — M8580 Other specified disorders of bone density and structure, unspecified site: Secondary | ICD-10-CM | POA: Diagnosis not present

## 2023-11-04 DIAGNOSIS — I10 Essential (primary) hypertension: Secondary | ICD-10-CM | POA: Diagnosis not present

## 2023-11-04 DIAGNOSIS — E785 Hyperlipidemia, unspecified: Secondary | ICD-10-CM | POA: Diagnosis not present

## 2023-11-04 DIAGNOSIS — E871 Hypo-osmolality and hyponatremia: Secondary | ICD-10-CM | POA: Diagnosis not present

## 2023-11-04 DIAGNOSIS — Z9089 Acquired absence of other organs: Secondary | ICD-10-CM | POA: Diagnosis not present

## 2023-11-04 DIAGNOSIS — K5790 Diverticulosis of intestine, part unspecified, without perforation or abscess without bleeding: Secondary | ICD-10-CM | POA: Diagnosis not present

## 2023-11-04 DIAGNOSIS — E872 Acidosis, unspecified: Secondary | ICD-10-CM | POA: Diagnosis not present

## 2023-11-04 DIAGNOSIS — K219 Gastro-esophageal reflux disease without esophagitis: Secondary | ICD-10-CM | POA: Diagnosis not present

## 2023-11-04 DIAGNOSIS — E538 Deficiency of other specified B group vitamins: Secondary | ICD-10-CM | POA: Diagnosis not present

## 2023-11-04 DIAGNOSIS — D72829 Elevated white blood cell count, unspecified: Secondary | ICD-10-CM | POA: Diagnosis not present

## 2023-11-04 DIAGNOSIS — D696 Thrombocytopenia, unspecified: Secondary | ICD-10-CM | POA: Diagnosis not present

## 2023-11-04 DIAGNOSIS — Z7952 Long term (current) use of systemic steroids: Secondary | ICD-10-CM | POA: Diagnosis not present

## 2023-11-04 DIAGNOSIS — N179 Acute kidney failure, unspecified: Secondary | ICD-10-CM | POA: Diagnosis not present

## 2023-11-05 ENCOUNTER — Ambulatory Visit (INDEPENDENT_AMBULATORY_CARE_PROVIDER_SITE_OTHER): Admitting: Family Medicine

## 2023-11-05 ENCOUNTER — Encounter: Payer: Self-pay | Admitting: Family Medicine

## 2023-11-05 VITALS — BP 150/90 | HR 110 | Temp 98.5°F | Ht <= 58 in | Wt 153.3 lb

## 2023-11-05 DIAGNOSIS — E785 Hyperlipidemia, unspecified: Secondary | ICD-10-CM | POA: Diagnosis not present

## 2023-11-05 DIAGNOSIS — M1612 Unilateral primary osteoarthritis, left hip: Secondary | ICD-10-CM | POA: Diagnosis not present

## 2023-11-05 DIAGNOSIS — I10 Essential (primary) hypertension: Secondary | ICD-10-CM

## 2023-11-05 MED ORDER — METOPROLOL SUCCINATE ER 25 MG PO TB24: 25.0000 mg | ORAL_TABLET | Freq: Every day | ORAL | 1 refills | Status: DC

## 2023-11-05 MED ORDER — ROSUVASTATIN CALCIUM 20 MG PO TABS: ORAL_TABLET | ORAL | 1 refills | Status: DC

## 2023-11-05 NOTE — Patient Instructions (Signed)
 Stop Hydrochlorothiazide   Start Metoprolol 25 mg daily (toprol XL)

## 2023-11-05 NOTE — Progress Notes (Signed)
 Established Patient Office Visit  Subjective   Patient ID: [name], female    DOB: [birth year]  Age: 68 y.o. MRN: [id number]  Chief Complaint  Patient presents with   Hospitalization Follow-up    Pt is here for hospital discharge follow up . She had been seeing Dr. Sharalyn Dasen for the TCP and she had undergone a bone marrow biopsy. Then she had an episode of epistaxis and symptoms of an acute gastroenteriits? She had also received a hip injection the Friday before as well. In the ED she had severe dehydration, electrolyte abnormalities and her CBC showed a platelet count of 30. States that her symptoms did not start until after she had the hip injection. Was admitted and multiple tests were run, she was given IV fluids and her labs improved back to her baseline. CT abd showed a fatty liver but no other acute findings or infection, cultures of blood and urine both negative, she was seen by ID and hematology, no source of infection was found. States that she is currently back to her baseline. The assumption is that she had a bad virus that caused the issues.   HTN -- BP is elevated today in the office. Pt reports she is compliant with her medications, no chest pain, no dizziness or SOB. She repoirts she still feels weak from her hospital stay. Reports she is drinking a lot of fluids and trying to stay hydrated.   I spent 30 minutes today reviewing d/c summary. Labs, consult notes, imaging, etc.   Current Outpatient Medications  Medication Instructions   alendronate  (FOSAMAX ) 70 mg, Weekly   celecoxib  (CELEBREX ) 100 mg, As needed   fluticasone  (FLONASE ) 50 MCG/ACT nasal spray SPRAY 2 SPRAYS INTO EACH NOSTRIL EVERY DAY   lisinopril  (ZESTRIL ) 40 mg, Oral, Daily   metoprolol succinate (TOPROL-XL) 25 mg, Oral, Daily   rosuvastatin  (CRESTOR ) 20 MG tablet TAKE 1 TABLET BY MOUTH DAILY. GENERIC EQUIVALENT FOR CRESTOR .   VITAMIN D  PO 2,000 Units, Daily    Patient Active Problem List   Diagnosis  Date Noted   Essential hypertension 02/03/2009    Priority: 1.   Dyslipidemia 06/20/2012    Priority: 2.   Osteopenia/?Osteoporosis - followed by her gynecologist, Dr. Johnston Nao 06/20/2012    Priority: 4.   ARF (acute renal failure) (HCC) 10/19/2023   Hyponatremia 10/19/2023   Thrombocytopenia (HCC) 10/19/2023   Leucocytosis 10/19/2023   Elevated LFTs 10/19/2023   Acute leg pain, left 07/19/2022   H/O Turner syndrome 05/02/2012   INFLAMMATORY BOWEL DISEASE - Followed by GI 02/03/2009   History of colonic polyps 02/03/2009      Review of Systems  All other systems reviewed and are negative.     Objective:     BP (!) 150/90   Pulse (!) 110   Temp 98.5 F (36.9 C) (Oral)   Ht 4' 9 (1.448 m)   Wt 153 lb 4.8 oz (69.5 kg)   SpO2 96%   BMI 33.17 kg/m    Physical Exam Vitals reviewed.  Constitutional:      Appearance: Normal appearance. She is well-groomed and normal weight.  Neck:     Thyroid : No thyromegaly.   Cardiovascular:     Rate and Rhythm: Regular rhythm. Tachycardia present.     Pulses: Normal pulses.     Heart sounds: S1 normal and S2 normal.  Pulmonary:     Effort: Pulmonary effort is normal.     Breath sounds: Normal breath sounds and air entry.  Abdominal:     General: Bowel sounds are normal.   Musculoskeletal:     Right lower leg: No edema.     Left lower leg: No edema.   Neurological:     Mental Status: She is alert and oriented to person, place, and time. Mental status is at baseline.     Gait: Gait is intact.   Psychiatric:        Mood and Affect: Mood and affect normal.        Speech: Speech normal.        Behavior: Behavior normal.        Judgment: Judgment normal.      No results found for any visits on 11/05/23.    The 10-year ASCVD risk score (Arnett DK, et al., 2019) is: 11.8%    Assessment & Plan:  Essential hypertension Assessment & Plan: BP is elevated today in office, we had a long discussion about medication and  since she has this underlying sinus tachycardia that is asymptomatic I recommended stopping the hydrochlorothiazide  (pt was dehydrated in hospital) and we will switch to toprol XL 25 mg daily. Pt has a BP cuff at home and the therapist is monitoring her vital signs when she comes for PT. I will see her back in 6 weeks to re-evaluate her blood pressure and heart rate. I did consider sending her to cardiology, pt states she would like to try the medication first. She does have turner's syndrome and I asked the patient about underlying heart defects however pt states she was never told she had any as a child.   Orders: -     Metoprolol Succinate ER; Take 1 tablet (25 mg total) by mouth daily.  Dispense: 90 tablet; Refill: 1  Dyslipidemia Assessment & Plan: Reviewed last lipid panel, continue statin therapy as listed below, refills sent to the pharmacy  Orders: -     Rosuvastatin  Calcium ; TAKE 1 TABLET BY MOUTH DAILY. GENERIC EQUIVALENT FOR CRESTOR .  Dispense: 90 tablet; Refill: 1  Arthritis of left hip -     Ambulatory referral to Orthopedics   Pt requesting a new referral to a different orthopedist, will send to [facility]  Return in about 6 weeks (around 12/17/2023) for HTN.    Aida House, MD

## 2023-11-06 NOTE — Assessment & Plan Note (Signed)
 Reviewed last lipid panel, continue statin therapy as listed below, refills sent to the pharmacy

## 2023-11-06 NOTE — Assessment & Plan Note (Signed)
 BP is elevated today in office, we had a long discussion about medication and since she has this underlying sinus tachycardia that is asymptomatic I recommended stopping the hydrochlorothiazide  (pt was dehydrated in hospital) and we will switch to toprol XL 25 mg daily. Pt has a BP cuff at home and the therapist is monitoring her vital signs when she comes for PT. I will see her back in 6 weeks to re-evaluate her blood pressure and heart rate. I did consider sending her to cardiology, pt states she would like to try the medication first. She does have turner's syndrome and I asked the patient about underlying heart defects however pt states she was never told she had any as a child.

## 2023-11-07 DIAGNOSIS — E872 Acidosis, unspecified: Secondary | ICD-10-CM | POA: Diagnosis not present

## 2023-11-07 DIAGNOSIS — D72829 Elevated white blood cell count, unspecified: Secondary | ICD-10-CM | POA: Diagnosis not present

## 2023-11-07 DIAGNOSIS — D696 Thrombocytopenia, unspecified: Secondary | ICD-10-CM | POA: Diagnosis not present

## 2023-11-07 DIAGNOSIS — E871 Hypo-osmolality and hyponatremia: Secondary | ICD-10-CM | POA: Diagnosis not present

## 2023-11-07 DIAGNOSIS — Z9089 Acquired absence of other organs: Secondary | ICD-10-CM | POA: Diagnosis not present

## 2023-11-07 DIAGNOSIS — I10 Essential (primary) hypertension: Secondary | ICD-10-CM | POA: Diagnosis not present

## 2023-11-07 DIAGNOSIS — N179 Acute kidney failure, unspecified: Secondary | ICD-10-CM | POA: Diagnosis not present

## 2023-11-07 DIAGNOSIS — E785 Hyperlipidemia, unspecified: Secondary | ICD-10-CM | POA: Diagnosis not present

## 2023-11-07 DIAGNOSIS — M8580 Other specified disorders of bone density and structure, unspecified site: Secondary | ICD-10-CM | POA: Diagnosis not present

## 2023-11-07 DIAGNOSIS — E538 Deficiency of other specified B group vitamins: Secondary | ICD-10-CM | POA: Diagnosis not present

## 2023-11-07 DIAGNOSIS — Z860100 Personal history of colon polyps, unspecified: Secondary | ICD-10-CM | POA: Diagnosis not present

## 2023-11-07 DIAGNOSIS — Z7952 Long term (current) use of systemic steroids: Secondary | ICD-10-CM | POA: Diagnosis not present

## 2023-11-07 DIAGNOSIS — K219 Gastro-esophageal reflux disease without esophagitis: Secondary | ICD-10-CM | POA: Diagnosis not present

## 2023-11-07 DIAGNOSIS — K5790 Diverticulosis of intestine, part unspecified, without perforation or abscess without bleeding: Secondary | ICD-10-CM | POA: Diagnosis not present

## 2023-11-07 DIAGNOSIS — M549 Dorsalgia, unspecified: Secondary | ICD-10-CM | POA: Diagnosis not present

## 2023-11-07 DIAGNOSIS — H9190 Unspecified hearing loss, unspecified ear: Secondary | ICD-10-CM | POA: Diagnosis not present

## 2023-11-11 DIAGNOSIS — D72829 Elevated white blood cell count, unspecified: Secondary | ICD-10-CM | POA: Diagnosis not present

## 2023-11-11 DIAGNOSIS — K5790 Diverticulosis of intestine, part unspecified, without perforation or abscess without bleeding: Secondary | ICD-10-CM | POA: Diagnosis not present

## 2023-11-11 DIAGNOSIS — N179 Acute kidney failure, unspecified: Secondary | ICD-10-CM | POA: Diagnosis not present

## 2023-11-11 DIAGNOSIS — E785 Hyperlipidemia, unspecified: Secondary | ICD-10-CM | POA: Diagnosis not present

## 2023-11-11 DIAGNOSIS — M8580 Other specified disorders of bone density and structure, unspecified site: Secondary | ICD-10-CM | POA: Diagnosis not present

## 2023-11-11 DIAGNOSIS — E538 Deficiency of other specified B group vitamins: Secondary | ICD-10-CM | POA: Diagnosis not present

## 2023-11-11 DIAGNOSIS — Z860100 Personal history of colon polyps, unspecified: Secondary | ICD-10-CM | POA: Diagnosis not present

## 2023-11-11 DIAGNOSIS — Z7952 Long term (current) use of systemic steroids: Secondary | ICD-10-CM | POA: Diagnosis not present

## 2023-11-11 DIAGNOSIS — I10 Essential (primary) hypertension: Secondary | ICD-10-CM | POA: Diagnosis not present

## 2023-11-11 DIAGNOSIS — E872 Acidosis, unspecified: Secondary | ICD-10-CM | POA: Diagnosis not present

## 2023-11-11 DIAGNOSIS — M549 Dorsalgia, unspecified: Secondary | ICD-10-CM | POA: Diagnosis not present

## 2023-11-11 DIAGNOSIS — D696 Thrombocytopenia, unspecified: Secondary | ICD-10-CM | POA: Diagnosis not present

## 2023-11-11 DIAGNOSIS — H9190 Unspecified hearing loss, unspecified ear: Secondary | ICD-10-CM | POA: Diagnosis not present

## 2023-11-11 DIAGNOSIS — K219 Gastro-esophageal reflux disease without esophagitis: Secondary | ICD-10-CM | POA: Diagnosis not present

## 2023-11-11 DIAGNOSIS — Z9089 Acquired absence of other organs: Secondary | ICD-10-CM | POA: Diagnosis not present

## 2023-11-11 DIAGNOSIS — E871 Hypo-osmolality and hyponatremia: Secondary | ICD-10-CM | POA: Diagnosis not present

## 2023-11-14 ENCOUNTER — Ambulatory Visit: Admitting: Sports Medicine

## 2023-11-14 DIAGNOSIS — R29898 Other symptoms and signs involving the musculoskeletal system: Secondary | ICD-10-CM | POA: Diagnosis not present

## 2023-11-14 DIAGNOSIS — M51369 Other intervertebral disc degeneration, lumbar region without mention of lumbar back pain or lower extremity pain: Secondary | ICD-10-CM | POA: Diagnosis not present

## 2023-11-14 DIAGNOSIS — M5417 Radiculopathy, lumbosacral region: Secondary | ICD-10-CM | POA: Diagnosis not present

## 2023-11-14 DIAGNOSIS — M79605 Pain in left leg: Secondary | ICD-10-CM | POA: Diagnosis not present

## 2023-11-14 DIAGNOSIS — Z8781 Personal history of (healed) traumatic fracture: Secondary | ICD-10-CM

## 2023-11-14 MED ORDER — PREDNISONE 20 MG PO TABS: ORAL_TABLET | ORAL | 0 refills | Status: DC

## 2023-11-14 NOTE — Progress Notes (Signed)
 [name] - 68 y.o. female MRN [id number]  Date of birth: 1955-12-20  Office Visit Note: Visit Date: 11/14/2023 PCP: Ozell Heron HERO, MD Referred by: Ozell Heron HERO, MD  Subjective: Chief Complaint  Patient presents with   Left Hip - Pain   HPI: [name] is a pleasant 68 y.o. female who presents today for acute on chronic left hip pain, also with low back pain and radiating left leg pain. Here for second opinion and imaging review.  [name] has a very involved orthopedic history.  Back in 2013 she had a femoral neck/intertrochanteric fracture and is status post ORIF with intramedullary nail.  It took her about a year to fully recover from this but then following this she had been doing well.  Here recently over the last few months she has began having left hip pain as well as pain that radiates down the lateral leg and calf.  She has been seen and treated at [facility] orthopedic.  She underwent a fluoroscopy guided injection into the left hip on 10/11/2023 which preprocedurally went very well.  She had no issues initially but a few days following this she began feeling unwell and progressively weak and actually was admitted to the hospital.  Discharge note reviewed from 10/19/2023 showed that she was diagnosed with ITP, previously had a bone marrow biopsy which did not show any definite cause for her thrombocytopenia.  She is currently involved in physical therapy working on hip strength, balance and getting stronger, she is making improvements with this both in strength and in her pain.  Over the last month she has had extensive imaging including CT of the lumbar spine, hip x-rays, a left hip MRI and a lumbar spine MRI.  I did review all of these images during the visit today.  She has taken prednisone  in years past and has tolerated fine without adverse effects.  *Recent lab review has demonstrated a recovery in her platelets, 92 on 10/31/2023.  Lab Results  Component  Value Date   WBC 8.6 10/31/2023   HGB 11.8 (L) 10/31/2023   HCT 37.5 10/31/2023   MCV 89.7 10/31/2023   PLT 92 (L) 10/31/2023   Pertinent ROS were reviewed with the patient and found to be negative unless otherwise specified above in HPI.   Assessment & Plan: Visit Diagnoses:  1. Left leg pain   2. Lumbosacral radiculopathy at L5   3. Bulging of lumbar intervertebral disc   4. Weakness of left hip   5. History of fracture of femur    Plan: Impression is acute on chronic left leg pain with concomitant hip and back related pain, which is multifactorial in nature.  She has had extensive workup and previous imaging which I reviewed all with her today.  She has a history of left intratrochanteric femur fracture back in 2013 with IM nail.  She has pain that radiates down the lateral thigh and the lateral calf to just above the ankle.  This does correlate with her MRI which shows an extraforaminal disc bulge at the L5-S1 level which is impinging upon the L5 nerve root.  She had a severe reaction a few days after a previous hip injection, I did discuss today that I am not sure that it was this injection that caused her reaction to go into the hospital, although [name] would not like to pursue injections at this time, which I understand.  If an injection were to be ascertained, this would be for diagnostic  purposes with a left-sided transforaminal L5 injection.  I would like her to see my spine physician, Dr. Georgina, for his evaluation to what degree her left leg radiating symptoms seem to be coming from the low back and exiting nerve root.  He can decide on additional treatment such as possible anesthetic only diagnostic injection versus surgical intervention, versus PT and conservative care. [name] does have insufficiency of the hip flexors and weakness with her lateral abductor muscles which is causing a biomechanical gait abnormality which I also think is contributing to her pain.  She has made progress  with formalized physical therapy, I would like her to continue this and did discuss specific exercises she may do at home on her own with this as well.  Given her ITP with previous low platelets and her history of inflammatory bowel disease, would like to hold on NSAID therapy.  We will treat with a short course of prednisone  7-day taper for both diagnostic and therapeutic purposes.  If she receives very good relief in the short-term, that does point to the back/radicular symptoms being more contributing in nature (although is not diagnostic).  She will make an appointment with Dr. Georgina, spine.  I will see her back following this and after she completes further physical therapy.  Follow-up: Return for Make appt with Dr. Georgina to evaluate for L5 radiculopathy.   Meds & Orders:  Meds ordered this encounter  Medications   predniSONE  (DELTASONE ) 20 MG tablet    Sig: Take 2 tablets (40mg ) for 3 days, followed by 1 tablet (20mg ) for 4 additional days until completion    Dispense:  10 tablet    Refill:  0    Orders Placed This Encounter  Procedures   Ambulatory referral to Orthopedic Surgery     Procedures: No procedures performed      Clinical History: No specialty comments available.  She reports that she has never smoked. She has never used smokeless tobacco. No results for input(s): HGBA1C, LABURIC in the last 8760 hours.  Objective:    Physical Exam  Gen: Well-appearing, in no acute distress; non-toxic CV: Well-perfused. Warm.  Resp: Breathing unlabored on room air; no wheezing. Psych: Fluid speech in conversation; appropriate affect; normal thought process  Ortho Exam - Lumbar: No midline spinous process TTP.  There is 1+ bilateral patella and Achilles DTRs.  There is near full range of motion with flexion and extension.  Equivocal straight leg raise bilaterally.  - Left hip: Well-healed incision over the thigh from previous intramedullary nail of the hip.  There is mild TTP  over the greater trochanteric region without swelling or effusion.  There is weakness with hip abduction with 4/5 strength testing compared to the contralateral side.  There is a degree of proximal muscle weakness with a somewhat slow restricted gait bilaterally. Does have short stature, question how much of Turner's syndrome is contributing to gait abnormality.  Imaging:  *I did personally review the lumbar spine MRI and left hip MRI from 6 1/25 during the visit today.  I also reviewed both the left hip x-ray and CT lumbar spine from 10/19/2023.  *On my read of the lumbar MRI there is a far lateral disc bulge at the extraforaminal space that does appear to contact the exiting L5 nerve root.  There is mild to moderate disc bulging at the L4-L5 level with right neuroforaminal narrowing.  MR Lumbar Spine W Wo Contrast EXAM: MR Lumbar Spine with and without intravenous contrast. 10/20/2023 02:29:10  PM  TECHNIQUE: Multiplanar multisequence MRI of the lumbar spine was performed with and without the administration of intravenous contrast.  COMPARISON: CT lumbar spine 10/19/2023.  CLINICAL HISTORY: Myelopathy, acute, lumbar spine.  FINDINGS:  BONES AND ALIGNMENT: Conventional lumbosacral anatomy is assumed with 5 non-rib-bearing, lumbar-type vertebral bodies. No abnormal enhancement. Normal alignment.  SPINAL CORD: The conus terminates at L2.  SOFT TISSUES: No acute abnormality. Horseshoe kidney. Moderate fatty atrophy of the paraspinal muscles.  L1-L2: Small disc bulge. No spinal canal stenosis or neural foraminal narrowing.  L2-L3: Small disc bulge and mild bilateral facet arthropathy. No spinal canal stenosis or neural foraminal narrowing.  L3-L4: No disc herniation. No spinal canal stenosis or neural foraminal narrowing.  L4-L5: Right eccentric disc bulge and facet arthropathy result in moderate right neural foraminal narrowing.  L5-S1: Disc bulge with lateral disc  contacting the exited left L5 nerve root in the extraforaminal zone. Mild bilateral facet arthropathy. No spinal canal stenosis.  IMPRESSION: 1. Right eccentric disc bulge and facet arthropathy at L4-L5 resulting in moderate right neural foraminal narrowing. 2. Disc bulge at L5-S1 with lateral disc contacting the exited left L5 nerve root in the extraforaminal zone.  Electronically signed by: Ryan Chess MD 10/20/2023 03:04 PM EDT RP Workstation: [id number] MR HIP LEFT W WO CONTRAST CLINICAL DATA:  Left hip pain.  EXAM: MRI OF THE LEFT HIP WITHOUT AND WITH CONTRAST  TECHNIQUE: Multiplanar, multisequence MR imaging was performed both before and after administration of intravenous contrast.  CONTRAST:  7mL GADAVIST  GADOBUTROL  1 MMOL/ML IV SOLN  COMPARISON:  Left hip radiographs and CT abdomen/pelvis dated 10/19/2023.  FINDINGS: Soft tissue and Muscle:  Susceptibility artifact related to indwelling orthopedic hardware in the left hip limits evaluation of the surrounding soft tissues. There is no obvious soft tissue swelling. Muscle bulk is age-appropriate. Hamstring tendon origins are intact. Left gluteal cuff insertions are not well evaluated due to significant susceptibility artifact. Visualized intrapelvic contents are grossly unremarkable.  Bones/ Hip:  Status post ORIF of the left hip with associated susceptibility artifact which limits evaluation of the surrounding bone. Chronic posttraumatic deformity of the left proximal femur with areas of heterotopic ossification adjacent to the left greater trochanter. No appreciable joint effusion. No periarticular collection identified. The labrum is suboptimally evaluated on this exam.  No discrete marrow signal abnormality is identified. No evidence of acute fracture or dislocation. Sacroiliac joints and pubic symphysis are anatomically aligned with degenerative changes. Degenerative changes of the visualized lower  lumbar spine.  IMPRESSION: Status post ORIF of the left proximal femur with associated susceptibility artifact, which limits evaluation of the surrounding bone and soft tissues. Similar chronic posttraumatic deformity of the left proximal femur. No joint effusion or periarticular collection identified. No acute osseous abnormality.  Electronically Signed   By: Harrietta Sherry M.D.   On: 10/20/2023 15:01  Narrative & Impression  EXAM: 2 or 3 VIEW(S) XRAY OF THE LEFT HIP 10/19/2023 10:22:00 PM   COMPARISON: None available.   CLINICAL HISTORY: Pain. Pt reports with weakness and not being able to eat since having her hip injection on the 23rd of May. Pt reports feeling dehydrated.   FINDINGS:   BONES AND JOINTS: Stable ORIF of the left hip with deformity. No evidence of hardware complication. Mild degenerative change of the right hip. Mild degenerative changes of the lower lumbar spine.   SOFT TISSUES: The soft tissues are unremarkable.   IMPRESSION: 1. Stable ORIF of the left hip. No evidence of complication.  Electronically signed by: Pinkie Pebbles MD 10/19/2023 10:28 PM EDT RP Workstation: [id number]   Narrative & Impression  CLINICAL DATA:  Pt reports with weakness and not being able to eat since having her hip injection on the 23rd of May. Pt reports feeling dehydrated.   EXAM: CT LUMBAR SPINE WITHOUT CONTRAST   TECHNIQUE: Multidetector CT imaging of the lumbar spine was performed without intravenous contrast administration. Multiplanar CT image reconstructions were also generated.   RADIATION DOSE REDUCTION: This exam was performed according to the departmental dose-optimization program which includes automated exposure control, adjustment of the mA and/or kV according to patient size and/or use of iterative reconstruction technique.   COMPARISON:  MRI lumbar spine 09/09/2023   FINDINGS: Segmentation: 5 lumbar type vertebrae.   Alignment:  Normal.   Vertebrae: Multilevel at least moderate degenerative changes of the spine. Chronic stable anterior wedge compression fracture of the T12 and L1 vertebral bodies. No severe osseous foraminal or central canal stenosis. No acute fracture or focal pathologic process.   Paraspinal and other soft tissues: Negative.   Disc levels: Multilevel intervertebral disc space vacuum phenomenon.   Other: At least moderate atherosclerotic plaque. Horseshoe kidney. Subcentimeter hypodensity within the right kidney too small to characterize-no further follow-up indicated.   IMPRESSION: 1. No acute displaced fracture or traumatic listhesis of the lumbar spine. 2.  Aortic Atherosclerosis (ICD10-I70.0).     Electronically Signed   By: Morgane  Naveau M.D.   On: 10/19/2023 20:10    Past Medical/Family/Surgical/Social History: Medications & Allergies reviewed per EMR, new medications updated. Patient Active Problem List   Diagnosis Date Noted   ARF (acute renal failure) (HCC) 10/19/2023   Hyponatremia 10/19/2023   Thrombocytopenia (HCC) 10/19/2023   Leucocytosis 10/19/2023   Elevated LFTs 10/19/2023   Acute leg pain, left 07/19/2022   Osteopenia/?Osteoporosis - followed by her gynecologist, Dr. Kandyce 06/20/2012   Dyslipidemia 06/20/2012   H/O Turner syndrome 05/02/2012   Essential hypertension 02/03/2009   INFLAMMATORY BOWEL DISEASE - Followed by GI 02/03/2009   History of colonic polyps 02/03/2009   Past Medical History:  Diagnosis Date   B12 DEFICIENCY 02/09/2009   Qualifier: Diagnosis of  By: Joshua RN, CGRN, Sheri     Blood in stool    COLONIC POLYPS, HYPERPLASTIC, HX OF 02/03/2009   Qualifier: Diagnosis of  By: Surface RN, Arland MENDS, COLON 02/03/2009   Qualifier: Diagnosis of  By: Surface RN, Donna     Dyslipidemia 06/20/2012   Fracture, intertrochanteric, left femur (HCC) 05/02/2012   GERD (gastroesophageal reflux disease)    Hearing loss    Bil/has hearing  aids   Hemorrhoids    HEMORRHOIDS 02/03/2009   Qualifier: Diagnosis of  By: Surface RN, Arland     Hypertension    INFLAMMATORY BOWEL DISEASE - Followed by Dr. Jakie in GI 02/03/2009   Qualifier: Diagnosis of  By: Surface RN, Arland Bristle    Turner's syndrome    was on Provera and Premarin and d/c this  at age 3yr   Ulcerative colitis    Family History  Problem Relation Age of Onset   Lymphoma Mother        nhl-mantle cell   Lymphoma Father        nhl   Healthy Sister    Melanoma Maternal Grandmother    Heart disease Maternal Grandfather    Ovarian cancer Paternal Grandmother    Pancreatic cancer Paternal Grandfather    Colon cancer  Neg Hx    Esophageal cancer Neg Hx    Stomach cancer Neg Hx    Rectal cancer Neg Hx    Breast cancer Neg Hx    Colon polyps Neg Hx    Past Surgical History:  Procedure Laterality Date   COLONOSCOPY  last 03/25/2013   HERNIA REPAIR  2009   lt ing    INCISION AND DRAINAGE  2004    lt thumb dog bite   INTRAMEDULLARY (IM) NAIL INTERTROCHANTERIC  05/02/2012   Procedure: INTRAMEDULLARY (IM) NAIL INTERTROCHANTRIC;  Surgeon: Fonda SHAUNNA Olmsted, MD;  Location: [facility] OR;  Service: Orthopedics;  Laterality: Left;   ORIF FINGER FRACTURE  06/14/2011   Procedure: OPEN REDUCTION INTERNAL FIXATION (ORIF) METACARPAL (FINGER) FRACTURE;  Surgeon: Franky JONELLE Curia, MD;  Location: [facility];  Service: Orthopedics;  Laterality: Right;  right ring   POLYPECTOMY     TONSILLECTOMY     UPPER GASTROINTESTINAL ENDOSCOPY     Social History   Occupational History   Not on file  Tobacco Use   Smoking status: Never   Smokeless tobacco: Never  Vaping Use   Vaping status: Never Used  Substance and Sexual Activity   Alcohol use: Yes    Alcohol/week: 1.0 standard drink of alcohol    Types: 1 Glasses of wine per week    Comment: occ.   Drug use: No   Sexual activity: Not on file   I spent 65 minutes in the care of the patient today including  face-to-face time, preparation to see the patient, as well as review of outside orthopedic care and treatment (Murphy-Wainer), independent review and interpretation of the following imaging: CT lumbar spine, left hip x-ray, left hip MRI, lumbar spine MRI (did review personally and in room with patient), review of notable surgical history, conversation about referral and diagnostic workup from my orthopedic spine physician, Dr. Georgina (discussed case with him), inpatient hospital reviewing discharge note, medication management and guidance on both formalized physical therapy and home rehab exercises, time spent dictating information and plan in AVS for patient for the above diagnoses.   Lonell Sprang, DO Primary Care Sports Medicine Physician  [facility] - Orthopedics  This note was dictated using Dragon naturally speaking software and may contain errors in syntax, spelling, or content which have not been identified prior to signing this note.

## 2023-11-14 NOTE — Progress Notes (Signed)
 Patient says that she was seen by an orthopedic physician on 10/11/2023 for hip pain and was given an injection into the hip. She says that she became very sick after the injection and was hospitalized for dehydration and low platelets. She says that her hip pain may have improved some, although she is not certain as she was so sick soon after. She is in physical therapy once a week for 6 weeks, and does exercises on her own at home. She says that she does feel she is getting stronger, improving balance, and feeling improvements in her pain, with the physical therapy. She was using a heating pad initially, but no longer uses that. She does not take any medication for her hip pain. She says that her pain goes down the front of her leg, sometimes past the knee, but denies any numbness or tingling.

## 2023-11-15 ENCOUNTER — Encounter: Payer: Self-pay | Admitting: Sports Medicine

## 2023-11-15 NOTE — Patient Instructions (Addendum)
 [name],  It was really nice meeting you yesterday. Hopefully you are happy we have a plan going forward regarding your pain. I think it is two-things contributing. You do have a nerve being pinched in the low back that I believe is causing your lateral hip/leg pain. You also have muscle weakness in the hip flexors and lateral hip stabilizing muscles which is affecting your gait and also contributing to your pain. As we discussed here is our plan for now:  - You do have some weakness of the hip flexors and the lateral hip stabilizing muscles for the hip and leg. You have made progress in physical therapy and I would like you to continue this both at PT and at home to further improve.  - I have sent a referral to Dr. Georgina, our spine physician.  This is for him to evaluate your MRI and get his opinion on how much of your lateral leg pain is coming from the nerve impingement from the low back.  - We will place you on a short course of a prednisone  taper for only 7 days, and you will pay attention to what percentage/degree this improves your pain.  If this significantly improves the pain even short-term, that is more likely coming from the nerve impingement from the low back (but not necessarily diagnostic).  I will see you back once you have seen Dr. Georgina and progressed through therapy.  If you have any questions, don't hesitate to reach out.  - Dr. Burnetta

## 2023-11-18 DIAGNOSIS — K080 Exfoliation of teeth due to systemic causes: Secondary | ICD-10-CM | POA: Diagnosis not present

## 2023-11-21 DIAGNOSIS — E872 Acidosis, unspecified: Secondary | ICD-10-CM | POA: Diagnosis not present

## 2023-11-21 DIAGNOSIS — E785 Hyperlipidemia, unspecified: Secondary | ICD-10-CM | POA: Diagnosis not present

## 2023-11-21 DIAGNOSIS — M549 Dorsalgia, unspecified: Secondary | ICD-10-CM | POA: Diagnosis not present

## 2023-11-21 DIAGNOSIS — E538 Deficiency of other specified B group vitamins: Secondary | ICD-10-CM | POA: Diagnosis not present

## 2023-11-21 DIAGNOSIS — N179 Acute kidney failure, unspecified: Secondary | ICD-10-CM | POA: Diagnosis not present

## 2023-11-21 DIAGNOSIS — D696 Thrombocytopenia, unspecified: Secondary | ICD-10-CM | POA: Diagnosis not present

## 2023-11-21 DIAGNOSIS — K5790 Diverticulosis of intestine, part unspecified, without perforation or abscess without bleeding: Secondary | ICD-10-CM | POA: Diagnosis not present

## 2023-11-21 DIAGNOSIS — Z9089 Acquired absence of other organs: Secondary | ICD-10-CM | POA: Diagnosis not present

## 2023-11-21 DIAGNOSIS — D72829 Elevated white blood cell count, unspecified: Secondary | ICD-10-CM | POA: Diagnosis not present

## 2023-11-21 DIAGNOSIS — I10 Essential (primary) hypertension: Secondary | ICD-10-CM | POA: Diagnosis not present

## 2023-11-21 DIAGNOSIS — Z860100 Personal history of colon polyps, unspecified: Secondary | ICD-10-CM | POA: Diagnosis not present

## 2023-11-21 DIAGNOSIS — Z7952 Long term (current) use of systemic steroids: Secondary | ICD-10-CM | POA: Diagnosis not present

## 2023-11-21 DIAGNOSIS — H9190 Unspecified hearing loss, unspecified ear: Secondary | ICD-10-CM | POA: Diagnosis not present

## 2023-11-21 DIAGNOSIS — K219 Gastro-esophageal reflux disease without esophagitis: Secondary | ICD-10-CM | POA: Diagnosis not present

## 2023-11-21 DIAGNOSIS — M8580 Other specified disorders of bone density and structure, unspecified site: Secondary | ICD-10-CM | POA: Diagnosis not present

## 2023-11-21 DIAGNOSIS — E871 Hypo-osmolality and hyponatremia: Secondary | ICD-10-CM | POA: Diagnosis not present

## 2023-11-28 ENCOUNTER — Encounter: Payer: Self-pay | Admitting: *Deleted

## 2023-11-28 NOTE — Telephone Encounter (Signed)
 Call disconnected , NT attempted to contact patient back and had to leave message to call back .   Copied from CRM [id number]. Topic: Clinical - Red Word Triage >> Nov 28, 2023  8:19 AM [name] wrote: Red Word that prompted transfer to Nurse Triage: Headache, weak, vomiting water/nausea.. Some of the symptoms has gotten worse This encounter was created in error - please disregard.

## 2023-11-28 NOTE — Telephone Encounter (Signed)
 FYI Only or Action Required?: FYI only for provider.  Patient was last seen in primary care on 11/05/2023 by Ozell Heron HERO, MD.  Called Nurse Triage reporting Error and Fatigue.  Symptoms began several days ago.  Interventions attempted: Rest, hydration, or home remedies.  Symptoms are: gradually worsening.  Triage Disposition: See PCP Within 2 Weeks, No Contact Calls  Patient/caregiver understands and will follow disposition?: yes- prefers to see PCP

## 2023-11-28 NOTE — Progress Notes (Signed)
 Noted- ok to close.

## 2023-11-29 ENCOUNTER — Encounter: Payer: Self-pay | Admitting: Family Medicine

## 2023-11-29 ENCOUNTER — Ambulatory Visit: Admitting: Family Medicine

## 2023-11-29 VITALS — BP 132/80 | HR 120 | Temp 98.1°F | Ht <= 58 in | Wt 159.9 lb

## 2023-11-29 DIAGNOSIS — D72829 Elevated white blood cell count, unspecified: Secondary | ICD-10-CM

## 2023-11-29 DIAGNOSIS — R3589 Other polyuria: Secondary | ICD-10-CM | POA: Diagnosis not present

## 2023-11-29 DIAGNOSIS — R112 Nausea with vomiting, unspecified: Secondary | ICD-10-CM

## 2023-11-29 MED ORDER — ONDANSETRON 4 MG PO TBDP: 4.0000 mg | ORAL_TABLET | Freq: Three times a day (TID) | ORAL | 2 refills | Status: DC | PRN

## 2023-11-29 NOTE — Progress Notes (Signed)
   Acute Office Visit  Subjective:     Patient ID: [name], female    DOB: [birth year], 68 y.o.   MRN: [id number]  Chief Complaint  Patient presents with   Nausea    X2 days   Headache    X2 days, tried Tylenol  with relief   Emesis    X2 days    Headache  Associated symptoms include vomiting.  Emesis  Associated symptoms include headaches.   Patient is in today for acute symptoms, she has been having nausea, headache and vomiting for the past 2 days. States that she had very similar symptoms the last time this happened. She was admitted the last time this happened and she was found to have ITP for an unknown reason. States that she was feeling better, did receive a course of steroids from the new orthopedist she saw but had finished this before her symptoms started. States that she is worried she is going to end up in the hospital again ,CT head and CT abd/pelvis were unremarkable 1 month ago. States that the headaches started first, then followed by the nausea and vomiting. States that the headaches have subsided somewhat at this time.   Review of Systems  Gastrointestinal:  Positive for vomiting.  Neurological:  Positive for headaches.        Objective:    BP 132/80   Pulse (!) 120   Temp 98.1 F (36.7 C) (Oral)   Ht 4' 9 (1.448 m)   Wt 159 lb 14.4 oz (72.5 kg)   SpO2 97%   BMI 34.60 kg/m    Physical Exam Vitals reviewed.  Constitutional:      Appearance: She is well-developed. She is obese.  Pulmonary:     Effort: Pulmonary effort is normal.     Breath sounds: Normal breath sounds. No wheezing.  Abdominal:     General: Bowel sounds are normal.     Palpations: Abdomen is soft.  Neurological:     Mental Status: She is alert.     Lab Results  Component Value Date   HGBA1C 5.4 06/03/2017   HGBA1C 5.4 08/17/2013   HGBA1C 5.3 03/03/2012         Assessment & Plan:   Problem List Items Addressed This Visit   None Visit Diagnoses       Polyuria     -  Primary     Nausea and vomiting, unspecified vomiting type       Relevant Medications   ondansetron  (ZOFRAN -ODT) 4 MG disintegrating tablet   Other Relevant Orders   CBC with Differential/Platelet (Completed)   CMP (Completed)     The symptoms returned again after a course of prednisone . I wonder if this is some sort of adverse reaction to the use of steroids, causing thrombocytopenia and N/V, headache, elevation in liver function tests. I will order new CMP and CBC to check. Pt states her symptoms have already improved at this point but I cautioned the patient not to use steroids again until we can determine the reason for the reaction. I will rx ondansetron  for the nausea as needed.   Meds ordered this encounter  Medications   ondansetron  (ZOFRAN -ODT) 4 MG disintegrating tablet    Sig: Take 1 tablet (4 mg total) by mouth every 8 (eight) hours as needed for nausea or vomiting.    Dispense:  20 tablet    Refill:  2    No follow-ups on file.  Heron CHRISTELLA Sharper, MD

## 2023-11-30 LAB — CBC WITH DIFFERENTIAL/PLATELET

## 2023-12-02 ENCOUNTER — Telehealth: Payer: Self-pay | Admitting: *Deleted

## 2023-12-02 ENCOUNTER — Ambulatory Visit: Payer: Self-pay | Admitting: Family Medicine

## 2023-12-02 LAB — CBC WITH DIFFERENTIAL/PLATELET
Basophils Absolute: 0 x10E3/uL (ref 0.0–0.2)
Basos: 0
EOS (ABSOLUTE): 0 x10E3/uL (ref 0.0–0.4)
Eos: 0
Hematocrit: 44.1 (ref 34.0–46.6)
Hemoglobin: 14.2 g/dL (ref 11.1–15.9)
Lymphocytes Absolute: 2.1 x10E3/uL (ref 0.7–3.1)
Lymphs: 19
MCH: 27.6 pg (ref 26.6–33.0)
MCHC: 32.2 g/dL (ref 31.5–35.7)
MCV: 86 fL (ref 79–97)
Monocytes Absolute: 2.6 x10E3/uL — AB (ref 0.1–0.9)
Monocytes: 24
Neutrophils Absolute: 5.8 x10E3/uL (ref 1.4–7.0)
Neutrophils: 53
Platelets: 28 x10E3/uL — AB (ref 150–450)
RBC: 5.15 x10E6/uL (ref 3.77–5.28)
RDW: 14.1 (ref 11.7–15.4)
WBC: 10.9 x10E3/uL — AB (ref 3.4–10.8)

## 2023-12-02 LAB — COMPREHENSIVE METABOLIC PANEL WITH GFR
ALT: 285 IU/L — ABNORMAL HIGH (ref 0–32)
AST: 340 IU/L — ABNORMAL HIGH (ref 0–40)
Albumin: 4.3 g/dL (ref 3.9–4.9)
Alkaline Phosphatase: 94 IU/L (ref 44–121)
BUN/Creatinine Ratio: 15 (ref 12–28)
BUN: 13 mg/dL (ref 8–27)
Bilirubin Total: 1.3 mg/dL — ABNORMAL HIGH (ref 0.0–1.2)
CO2: 16 mmol/L — ABNORMAL LOW (ref 20–29)
Calcium: 9.1 mg/dL (ref 8.7–10.3)
Chloride: 98 mmol/L (ref 96–106)
Creatinine, Ser: 0.84 mg/dL (ref 0.57–1.00)
Globulin, Total: 2.1 g/dL (ref 1.5–4.5)
Glucose: 100 mg/dL — ABNORMAL HIGH (ref 70–99)
Potassium: 4.1 mmol/L (ref 3.5–5.2)
Sodium: 137 mmol/L (ref 134–144)
Total Protein: 6.4 g/dL (ref 6.0–8.5)
eGFR: 76 mL/min/1.73 (ref >59)

## 2023-12-02 LAB — IMMATURE CELLS: MYELOCYTES: 4 % — ABNORMAL HIGH (ref 0–0)

## 2023-12-02 NOTE — Telephone Encounter (Unsigned)
 Copied from CRM [id number]. Topic: Clinical - Lab/Test Results >> Dec 02, 2023  8:42 AM Franky GRADE wrote: Reason for CRM: Patient is calling because she viewed her lab results though Mychart, I advised patient of the message Dr.Michael left this morning which patient had not seen. She understands and would like a call regarding the results of the blood count once it comes in.

## 2023-12-02 NOTE — Telephone Encounter (Signed)
 Per Dr. Cloretta: platelets were clumped that potentially leaves the 7/11 count inaccurate. Suggests repeat tomorrow and wait in lobby for results and instructions. She agrees.

## 2023-12-02 NOTE — Progress Notes (Signed)
 Good afternoon. I saw this patient on Friday and she was complaining of recurrent symptoms, headache and vomiting, I checked her labs and the CBC came back with platelets of 28. She also had elevated LFT's. She was given a course of steroids by the new orthopedist prior to this happening again. I spoke with her this afternoon and her headache and vomiting have resolved. Could this be due to a adv reaction to exposure to steroids? Please advise thanks! Dr. Ozell

## 2023-12-02 NOTE — Telephone Encounter (Signed)
 Daughter called to report [name]'s platelets are low again per PCP office on 7/11 (28,000). Called patient and she reports she is not bleeding or bruising. Having headaches, nausea, extreme fatigue as she has in past when counts were low.

## 2023-12-02 NOTE — Telephone Encounter (Signed)
 Spoke with patient on phone and informed her of the results, ok to close

## 2023-12-03 ENCOUNTER — Other Ambulatory Visit

## 2023-12-03 ENCOUNTER — Other Ambulatory Visit: Attending: Nurse Practitioner

## 2023-12-03 DIAGNOSIS — D696 Thrombocytopenia, unspecified: Secondary | ICD-10-CM | POA: Diagnosis not present

## 2023-12-03 LAB — CBC WITH DIFFERENTIAL (CANCER CENTER ONLY)
Abs Immature Granulocytes: 1.24 K/uL — ABNORMAL HIGH (ref 0.00–0.07)
Basophils Absolute: 0.1 K/uL (ref 0.0–0.1)
Basophils Relative: 1 %
Eosinophils Absolute: 0.1 K/uL (ref 0.0–0.5)
Eosinophils Relative: 1 %
HCT: 37 % (ref 36.0–46.0)
Hemoglobin: 11.6 g/dL — ABNORMAL LOW (ref 12.0–15.0)
Immature Granulocytes: 10 %
Lymphocytes Relative: 27 %
Lymphs Abs: 3.4 K/uL (ref 0.7–4.0)
MCH: 27.3 pg (ref 26.0–34.0)
MCHC: 31.4 g/dL (ref 30.0–36.0)
MCV: 87.1 fL (ref 80.0–100.0)
Monocytes Absolute: 3 K/uL — ABNORMAL HIGH (ref 0.1–1.0)
Monocytes Relative: 24 %
Neutro Abs: 4.8 K/uL (ref 1.7–7.7)
Neutrophils Relative %: 37 %
Platelet Count: 52 K/uL — ABNORMAL LOW (ref 150–400)
RBC: 4.25 MIL/uL (ref 3.87–5.11)
RDW: 15.7 % — ABNORMAL HIGH (ref 11.5–15.5)
WBC Count: 12.5 K/uL — ABNORMAL HIGH (ref 4.0–10.5)
WBC Morphology: ABNORMAL
nRBC: 0 % (ref 0.0–0.2)

## 2023-12-05 DIAGNOSIS — E785 Hyperlipidemia, unspecified: Secondary | ICD-10-CM | POA: Diagnosis not present

## 2023-12-05 DIAGNOSIS — K5790 Diverticulosis of intestine, part unspecified, without perforation or abscess without bleeding: Secondary | ICD-10-CM | POA: Diagnosis not present

## 2023-12-05 DIAGNOSIS — I10 Essential (primary) hypertension: Secondary | ICD-10-CM | POA: Diagnosis not present

## 2023-12-05 DIAGNOSIS — N179 Acute kidney failure, unspecified: Secondary | ICD-10-CM | POA: Diagnosis not present

## 2023-12-05 DIAGNOSIS — Z7952 Long term (current) use of systemic steroids: Secondary | ICD-10-CM | POA: Diagnosis not present

## 2023-12-05 DIAGNOSIS — M549 Dorsalgia, unspecified: Secondary | ICD-10-CM | POA: Diagnosis not present

## 2023-12-05 DIAGNOSIS — K219 Gastro-esophageal reflux disease without esophagitis: Secondary | ICD-10-CM | POA: Diagnosis not present

## 2023-12-05 DIAGNOSIS — E872 Acidosis, unspecified: Secondary | ICD-10-CM | POA: Diagnosis not present

## 2023-12-05 DIAGNOSIS — D696 Thrombocytopenia, unspecified: Secondary | ICD-10-CM | POA: Diagnosis not present

## 2023-12-05 DIAGNOSIS — D72829 Elevated white blood cell count, unspecified: Secondary | ICD-10-CM | POA: Diagnosis not present

## 2023-12-05 DIAGNOSIS — H9190 Unspecified hearing loss, unspecified ear: Secondary | ICD-10-CM | POA: Diagnosis not present

## 2023-12-05 DIAGNOSIS — E871 Hypo-osmolality and hyponatremia: Secondary | ICD-10-CM | POA: Diagnosis not present

## 2023-12-05 DIAGNOSIS — M8580 Other specified disorders of bone density and structure, unspecified site: Secondary | ICD-10-CM | POA: Diagnosis not present

## 2023-12-05 DIAGNOSIS — Z860100 Personal history of colon polyps, unspecified: Secondary | ICD-10-CM | POA: Diagnosis not present

## 2023-12-05 DIAGNOSIS — Z9089 Acquired absence of other organs: Secondary | ICD-10-CM | POA: Diagnosis not present

## 2023-12-05 DIAGNOSIS — E538 Deficiency of other specified B group vitamins: Secondary | ICD-10-CM | POA: Diagnosis not present

## 2023-12-09 ENCOUNTER — Telehealth: Payer: Self-pay | Admitting: Gastroenterology

## 2023-12-09 ENCOUNTER — Other Ambulatory Visit

## 2023-12-09 ENCOUNTER — Ambulatory Visit: Admitting: Oncology

## 2023-12-09 NOTE — Telephone Encounter (Signed)
 Patient called and stated that she has very high levels of her liver enzymes. Patient was given the next available appointment with Dr. Legrand PA which was September the 12 th. Patient refused to take the appointment and refused to be added to the cancellation list in case anyone cancelled. Patient is requesting to speak to the nurse regarding scheduling for a sooner appointment. Please advise.

## 2023-12-09 NOTE — Progress Notes (Signed)
   12/09/2023  Patient ID: [name], female   DOB: 22-May-1955, 68 y.o.   MRN: [id number]  Pharmacy Quality Measure Review  This patient is appearing on a report for being at risk of failing the adherence measure for cholesterol (statin) medications this calendar year.   Medication: Rosuvastatin  20 Last fill date: 11/05/23 for 90 day supply  Insurance report was not up to date. No action needed at this time.   Jon VEAR Lindau, PharmD Clinical Pharmacist [phone]

## 2023-12-09 NOTE — Telephone Encounter (Signed)
 Spoke with the pt and advised that the next available is Sept.  She declined first appt offered due to being out of town.  App made with next available with Dr Legrand.  (Pt prefers only Dr Legrand)  She already has a workup in process with PCP and will continue as directed.

## 2023-12-13 DIAGNOSIS — Z9089 Acquired absence of other organs: Secondary | ICD-10-CM | POA: Diagnosis not present

## 2023-12-13 DIAGNOSIS — M8580 Other specified disorders of bone density and structure, unspecified site: Secondary | ICD-10-CM | POA: Diagnosis not present

## 2023-12-13 DIAGNOSIS — E538 Deficiency of other specified B group vitamins: Secondary | ICD-10-CM | POA: Diagnosis not present

## 2023-12-13 DIAGNOSIS — E872 Acidosis, unspecified: Secondary | ICD-10-CM | POA: Diagnosis not present

## 2023-12-13 DIAGNOSIS — Z7952 Long term (current) use of systemic steroids: Secondary | ICD-10-CM | POA: Diagnosis not present

## 2023-12-13 DIAGNOSIS — I10 Essential (primary) hypertension: Secondary | ICD-10-CM | POA: Diagnosis not present

## 2023-12-13 DIAGNOSIS — K219 Gastro-esophageal reflux disease without esophagitis: Secondary | ICD-10-CM | POA: Diagnosis not present

## 2023-12-13 DIAGNOSIS — E785 Hyperlipidemia, unspecified: Secondary | ICD-10-CM | POA: Diagnosis not present

## 2023-12-13 DIAGNOSIS — M549 Dorsalgia, unspecified: Secondary | ICD-10-CM | POA: Diagnosis not present

## 2023-12-13 DIAGNOSIS — H9190 Unspecified hearing loss, unspecified ear: Secondary | ICD-10-CM | POA: Diagnosis not present

## 2023-12-13 DIAGNOSIS — D696 Thrombocytopenia, unspecified: Secondary | ICD-10-CM | POA: Diagnosis not present

## 2023-12-13 DIAGNOSIS — N179 Acute kidney failure, unspecified: Secondary | ICD-10-CM | POA: Diagnosis not present

## 2023-12-13 DIAGNOSIS — Z860100 Personal history of colon polyps, unspecified: Secondary | ICD-10-CM | POA: Diagnosis not present

## 2023-12-13 DIAGNOSIS — E871 Hypo-osmolality and hyponatremia: Secondary | ICD-10-CM | POA: Diagnosis not present

## 2023-12-13 DIAGNOSIS — K5790 Diverticulosis of intestine, part unspecified, without perforation or abscess without bleeding: Secondary | ICD-10-CM | POA: Diagnosis not present

## 2023-12-13 DIAGNOSIS — D72829 Elevated white blood cell count, unspecified: Secondary | ICD-10-CM | POA: Diagnosis not present

## 2023-12-16 ENCOUNTER — Ambulatory Visit (INDEPENDENT_AMBULATORY_CARE_PROVIDER_SITE_OTHER): Admitting: Family Medicine

## 2023-12-16 ENCOUNTER — Encounter: Payer: Self-pay | Admitting: Family Medicine

## 2023-12-16 VITALS — BP 120/74 | HR 100 | Temp 98.0°F | Wt 156.4 lb

## 2023-12-16 DIAGNOSIS — E785 Hyperlipidemia, unspecified: Secondary | ICD-10-CM | POA: Diagnosis not present

## 2023-12-16 DIAGNOSIS — Z0000 Encounter for general adult medical examination without abnormal findings: Secondary | ICD-10-CM

## 2023-12-16 DIAGNOSIS — R7989 Other specified abnormal findings of blood chemistry: Secondary | ICD-10-CM

## 2023-12-16 DIAGNOSIS — G4700 Insomnia, unspecified: Secondary | ICD-10-CM | POA: Diagnosis not present

## 2023-12-16 LAB — LIPID PANEL
Cholesterol: 163 mg/dL (ref 0–200)
HDL: 41.3 mg/dL (ref >39.00)
LDL Cholesterol: 96 mg/dL (ref 0–99)
NonHDL: 121.46
Total CHOL/HDL Ratio: 4
Triglycerides: 129 mg/dL (ref 0.0–149.0)
VLDL: 25.8 mg/dL (ref 0.0–40.0)

## 2023-12-16 LAB — COMPREHENSIVE METABOLIC PANEL WITH GFR
ALT: 27 U/L (ref 0–35)
AST: 19 U/L (ref 0–37)
Albumin: 4.4 g/dL (ref 3.5–5.2)
Alkaline Phosphatase: 94 U/L (ref 39–117)
BUN: 14 mg/dL (ref 6–23)
CO2: 26 meq/L (ref 19–32)
Calcium: 9.9 mg/dL (ref 8.4–10.5)
Chloride: 106 meq/L (ref 96–112)
Creatinine, Ser: 0.7 mg/dL (ref 0.40–1.20)
GFR: 89.49 mL/min (ref >60.00)
Glucose, Bld: 114 mg/dL — ABNORMAL HIGH (ref 70–99)
Potassium: 3.8 meq/L (ref 3.5–5.1)
Sodium: 142 meq/L (ref 135–145)
Total Bilirubin: 0.9 mg/dL (ref 0.2–1.2)
Total Protein: 6.9 g/dL (ref 6.0–8.3)

## 2023-12-16 MED ORDER — TRAZODONE HCL 50 MG PO TABS: 25.0000 mg | ORAL_TABLET | Freq: Every evening | ORAL | 3 refills | Status: DC | PRN

## 2023-12-16 NOTE — Progress Notes (Signed)
 Complete physical exam  Patient: [name]   DOB: [birth year]   68 y.o. Female  MRN: [id number]  Subjective:    Chief Complaint  Patient presents with   Hypertension    [name] is a 68 y.o. female who presents today for a complete physical exam. She reports consuming a general diet. Gym/ health club routine includes water aerobics/ walking twice a week. She generally feels well. She reports sleeping somehat poorly, states that she was on trazodone  previously, would like a new rx. She does not have additional problems to discuss today.    Most recent fall risk assessment:    11/19/2022    8:32 AM  Fall Risk   Falls in the past year? 0  Number falls in past yr: 0  Injury with Fall? 0  Risk for fall due to : No Fall Risks  Follow up Falls evaluation completed     Most recent depression screenings:    07/01/2023    2:30 PM 11/19/2022    8:31 AM  PHQ 2/9 Scores  PHQ - 2 Score 0 0  PHQ- 9 Score  3    Vision:Within last year and Dental: No current dental problems and Receives regular dental care  Patient Active Problem List   Diagnosis Date Noted   Essential hypertension 02/03/2009    Priority: 1.   Dyslipidemia 06/20/2012    Priority: 2.   Osteopenia/?Osteoporosis - followed by her gynecologist, Dr. Kandyce 06/20/2012    Priority: 4.   ARF (acute renal failure) (HCC) 10/19/2023   Hyponatremia 10/19/2023   Thrombocytopenia (HCC) 10/19/2023   Leucocytosis 10/19/2023   Elevated LFTs 10/19/2023   Acute leg pain, left 07/19/2022   H/O Turner syndrome 05/02/2012   INFLAMMATORY BOWEL DISEASE - Followed by GI 02/03/2009   History of colonic polyps 02/03/2009      Patient Care Team: Ozell Heron HERO, MD as PCP - General (Family Medicine) Kandyce Sor, MD as Consulting Physician (Obstetrics and Gynecology) Legrand Victory LITTIE MOULD, MD as Consulting Physician (Gastroenterology)   Outpatient Medications Prior to Visit  Medication Sig   alendronate  (FOSAMAX ) 70 MG  tablet Take 70 mg by mouth once a week.   celecoxib  (CELEBREX ) 100 MG capsule Take 100 mg by mouth as needed for mild pain (pain score 1-3) or moderate pain (pain score 4-6).   lisinopril  (ZESTRIL ) 40 MG tablet TAKE 1 TABLET BY MOUTH EVERY DAY   metoprolol  succinate (TOPROL -XL) 25 MG 24 hr tablet Take 1 tablet (25 mg total) by mouth daily.   ondansetron  (ZOFRAN -ODT) 4 MG disintegrating tablet Take 1 tablet (4 mg total) by mouth every 8 (eight) hours as needed for nausea or vomiting.   rosuvastatin  (CRESTOR ) 20 MG tablet TAKE 1 TABLET BY MOUTH DAILY. GENERIC EQUIVALENT FOR CRESTOR .   VITAMIN D  PO Take 2,000 Units by mouth daily.   [DISCONTINUED] fluticasone  (FLONASE ) 50 MCG/ACT nasal spray SPRAY 2 SPRAYS INTO EACH NOSTRIL EVERY DAY   [DISCONTINUED] predniSONE  (DELTASONE ) 20 MG tablet Take 2 tablets (40mg ) for 3 days, followed by 1 tablet (20mg ) for 4 additional days until completion   No facility-administered medications prior to visit.    Review of Systems  HENT:  Negative for hearing loss.   Eyes:  Negative for blurred vision.  Respiratory:  Negative for shortness of breath.   Cardiovascular:  Negative for chest pain.  Gastrointestinal: Negative.   Genitourinary: Negative.   Musculoskeletal:  Negative for back pain.  Neurological:  Negative for headaches.  Psychiatric/Behavioral:  Negative for depression.   All other systems reviewed and are negative.      Objective:     BP 120/74 (BP Location: Left Arm, Patient Position: Sitting, Cuff Size: Normal)   Pulse 100   Temp 98 F (36.7 C) (Oral)   Wt 156 lb 6.4 oz (70.9 kg)   SpO2 96%   BMI 33.84 kg/m    Physical Exam Vitals reviewed.  Constitutional:      Appearance: Normal appearance. She is well-groomed. She is obese.  HENT:     Right Ear: Tympanic membrane and ear canal normal.     Left Ear: Tympanic membrane and ear canal normal.     Mouth/Throat:     Mouth: Mucous membranes are moist.     Pharynx: No posterior  oropharyngeal erythema.  Eyes:     Conjunctiva/sclera: Conjunctivae normal.  Neck:     Thyroid : No thyromegaly.  Cardiovascular:     Rate and Rhythm: Normal rate and regular rhythm.     Pulses: Normal pulses.     Heart sounds: S1 normal and S2 normal.  Pulmonary:     Effort: Pulmonary effort is normal.     Breath sounds: Normal breath sounds and air entry.  Abdominal:     General: Abdomen is flat. Bowel sounds are normal.     Palpations: Abdomen is soft.  Musculoskeletal:     Right lower leg: No edema.     Left lower leg: No edema.  Lymphadenopathy:     Cervical: No cervical adenopathy.  Neurological:     Mental Status: She is alert and oriented to person, place, and time. Mental status is at baseline.     Gait: Gait is intact.  Psychiatric:        Mood and Affect: Mood and affect normal.        Speech: Speech normal.        Behavior: Behavior normal.        Judgment: Judgment normal.      No results found for any visits on 12/16/23.     Assessment & Plan:    Routine Health Maintenance and Physical Exam  Immunization History  Administered Date(s) Administered   Influenza Split 02/18/2013   Influenza, High Dose Seasonal PF 03/14/2023   Influenza-Unspecified 02/19/2016, 02/18/2017, 01/19/2018, 03/04/2020, 02/18/2021   Moderna Covid-19 Vaccine Bivalent Booster 90yrs & up 03/14/2023   PFIZER(Purple Top)SARS-COV-2 Vaccination 05/16/2019, 06/05/2019, 04/04/2020   PNEUMOCOCCAL CONJUGATE-20 03/20/2022   Rsv, Mab, Nirsevimab-alip, 1 Ml, Neonate To 24 Mos(Beyfortus) 11/02/2022   Td 05/21/1998   Td (Adult), 2 Lf Tetanus Toxid, Preservative Free 05/21/1998   Tdap 08/17/2013, 11/02/2022    Health Maintenance  Topic Date Due   Zoster Vaccines- Shingrix (1 of 2) Never done   COVID-19 Vaccine (5 - 2024-25 season) 05/09/2023   Medicare Annual Wellness (AWV)  08/01/2023   INFLUENZA VACCINE  12/20/2023   MAMMOGRAM  05/28/2024   Colonoscopy  05/19/2028   DTaP/Tdap/Td (5 - Td  or Tdap) 11/01/2032   Pneumococcal Vaccine: 50+ Years  Completed   DEXA SCAN  Completed   Hepatitis C Screening  Completed   Hepatitis B Vaccines  Aged Out   HPV VACCINES  Aged Out   Meningococcal B Vaccine  Aged Out    Discussed health benefits of physical activity, and encouraged her to engage in regular exercise appropriate for her age and condition.  Routine general medical examination at a health care facility  Elevated LFTs -     Comprehensive  metabolic panel with GFR; Future  Dyslipidemia -     Lipid panel; Future  Insomnia, unspecified type -     traZODone  HCl; Take 0.5-1 tablets (25-50 mg total) by mouth at bedtime as needed for sleep.  Dispense: 30 tablet; Refill: 3   Normal physical exam findings. I counseled the patient on the recommended amount of exercise per CDC recommendation. I reviewed preventative screening, immunizations, and medical history and updated in the chart, and appropriate labs and vaccinations were ordered. Handouts given on healthy eating and exercise.    Return in about 6 months (around 06/17/2024) for HTN -- augt 4th visit should be video AWV.     Heron CHRISTELLA Sharper, MD

## 2023-12-17 ENCOUNTER — Ambulatory Visit: Payer: Self-pay | Admitting: Family Medicine

## 2023-12-19 ENCOUNTER — Other Ambulatory Visit (INDEPENDENT_AMBULATORY_CARE_PROVIDER_SITE_OTHER): Payer: Self-pay

## 2023-12-19 ENCOUNTER — Ambulatory Visit: Admitting: Orthopedic Surgery

## 2023-12-19 VITALS — BP 129/70 | HR 92 | Ht <= 58 in | Wt 157.0 lb

## 2023-12-19 DIAGNOSIS — M545 Low back pain, unspecified: Secondary | ICD-10-CM

## 2023-12-19 MED ORDER — PREGABALIN 75 MG PO CAPS
75.0000 mg | ORAL_CAPSULE | Freq: Two times a day (BID) | ORAL | 1 refills | Status: DC
Start: 2023-12-19 — End: 2024-03-16

## 2023-12-19 NOTE — Progress Notes (Signed)
 Orthopedic Spine Surgery Office Note  Assessment: Patient is a 68 y.o. female with left lateral thigh pain.  Prior MRI from April 2025 does not show the lateral disc herniation at L5/S1 so I do not think her pain is coming from that.  I do not see any lumbar spine pathology besides that on her more recent MRI from June 2025 that would explain lateral thigh pain   Plan: -Patient has tried PT, Celebrex , oral steroids, left hip intra-articular injection -Recommended Lyrica  to try for additional pain relief which was prescribed today.  Provided her with a referral to physical therapy -Patient should return to office in 2 months, x-rays at next visit: None   Patient expressed understanding of the plan and all questions were answered to the patient's satisfaction.   ___________________________________________________________________________   History:  Patient is a 68 y.o. female who presents today for lumbar spine.  Patient has had several years of left lateral thigh pain.  She feels it from the buttock to the knee on the left side.  No pain in the right lower extremity.  Not having any significant back pain.  She also describes having a heaviness and sometimes difficulty to walk as a result of the left thigh.  She has noticed some improvement with oral steroids but has reactions to them so she does not want to use those going forward.  Her improvement was also temporary with those medications.  She did not notice any improvement with a left hip intra-articular injection and in fact had a reaction with that the required hospitalization.   Weakness: Denies Symptoms of imbalance: Denies Paresthesias and numbness: Denies Bowel or bladder incontinence: Denies Saddle anesthesia: Denies  Treatments tried: PT, Celebrex , oral steroids, left hip intra-articular injection  Review of systems: Denies fevers and chills, night sweats, unexplained weight loss, history of cancer, pain that wakes her at  night  Past medical history: Osteoporosis on Fosamax  HLD HTN Ulcerative colitis Thrombocytopenia  Allergies: prednisone   Past surgical history:  Left intertrochanteric femur fracture CMN Hernia repair Left thumb incision and drainage Polypectomy Tonsillectomy ORIF finger fracture  Social history: Denies use of nicotine product (smoking, vaping, patches, smokeless) Alcohol use: Yes, approximately 1 drink per week Denies recreational drug use   Physical Exam:  BMI of 34.0  General: no acute distress, appears stated age Neurologic: alert, answering questions appropriately, following commands Respiratory: unlabored breathing on room air, symmetric chest rise Psychiatric: appropriate affect, normal cadence to speech   MSK (spine):  -Strength exam      Left  Right EHL    5/5  5/5 TA    5/5  5/5 GSC    5/5  5/5 Knee extension  5/5  5/5 Hip flexion   5/5  5/5  -Sensory exam    Sensation intact to light touch in L3-S1 nerve distributions of bilateral lower extremities  -Achilles DTR: 1/4 on the left, 1/4 on the right -Patellar tendon DTR: 1/4 on the left, 1/4 on the right  -Straight leg raise: Negative bilaterally -Clonus: no beats bilaterally  -Left hip exam: No pain through range of motion -Right hip exam: No pain through range of motion  Imaging: XRs of the lumbar spine from 12/19/2023 were independently reviewed and interpreted, showing disc at loss at L4/5 and L5/S1.  No evidence of instability on flexion/extension views.  No fracture or dislocation seen.  MRI of the lumbar spine from 10/20/2023 was independently reviewed and interpreted, showing far lateral disc herniation on the left at  L5/S1.  No other neural compression seen.  No significant central, lateral recess, foraminal stenosis seen.   Patient name: [name] Patient MRN: [id number] Date of visit: 12/19/23

## 2023-12-23 ENCOUNTER — Telehealth (INDEPENDENT_AMBULATORY_CARE_PROVIDER_SITE_OTHER): Payer: Medicare Other | Admitting: Family Medicine

## 2023-12-23 ENCOUNTER — Encounter: Payer: Self-pay | Admitting: Family Medicine

## 2023-12-23 DIAGNOSIS — Z0000 Encounter for general adult medical examination without abnormal findings: Secondary | ICD-10-CM | POA: Diagnosis not present

## 2023-12-23 NOTE — Progress Notes (Signed)
 Patient unable to perform vital signs at home for the virtual visit.

## 2023-12-23 NOTE — Patient Instructions (Signed)
 Eat active culture yogurt OR probiotics 1-2 times per day  Fiber supplements -- 35 grams of fiber per day  Continue stool softeners

## 2023-12-23 NOTE — Progress Notes (Signed)
 Subjective:   [name] is a 68 y.o. female who presents for Medicare Annual (Subsequent) preventive examination.  Visit Complete: Virtual I connected with  [name] on 12/23/23 by a video and audio enabled telemedicine application and verified that I am speaking with the correct person using two identifiers.  Patient Location: Home  Provider Location: Office/Clinic  I discussed the limitations of evaluation and management by telemedicine. The patient expressed understanding and agreed to proceed.  Vital Signs: Because this visit was a virtual/telehealth visit, some criteria may be missing or patient reported. Any vitals not documented were not able to be obtained and vitals that have been documented are patient reported.  Patient Medicare AWV questionnaire was completed by the patient on 12/23/23; I have confirmed that all information answered by patient is correct and no changes since this date.  Cardiac Risk Factors include: advanced age (>26men, >33 women);obesity (BMI >30kg/m2)     Objective:    There were no vitals filed for this visit. There is no height or weight on file to calculate BMI.     12/23/2023    8:39 AM 10/31/2023    3:00 PM 10/19/2023    4:19 PM 08/22/2023    9:32 AM 08/13/2023    8:05 AM 07/17/2023   11:15 AM 07/01/2023    2:29 PM  Advanced Directives  Does Patient Have a Medical Advance Directive? Yes No No Yes Yes No No  Type of Estate agent of [place];Living will   Healthcare Power of [place];Living will Healthcare Power of [place];Living will    Does patient want to make changes to medical advance directive? No - Patient declined No - Patient declined  No - Patient declined No - Patient declined    Copy of Healthcare Power of Attorney in Chart? No - copy requested   No - copy requested No - copy requested    Would patient like information on creating a medical advance directive?  No - Patient declined No - Patient declined   No  - Patient declined No - Patient declined    Current Medications (verified) Outpatient Encounter Medications as of 12/23/2023  Medication Sig   alendronate  (FOSAMAX ) 70 MG tablet Take 70 mg by mouth once a week.   celecoxib  (CELEBREX ) 100 MG capsule Take 100 mg by mouth as needed for mild pain (pain score 1-3) or moderate pain (pain score 4-6).   lisinopril  (ZESTRIL ) 40 MG tablet TAKE 1 TABLET BY MOUTH EVERY DAY   metoprolol  succinate (TOPROL -XL) 25 MG 24 hr tablet Take 1 tablet (25 mg total) by mouth daily.   ondansetron  (ZOFRAN -ODT) 4 MG disintegrating tablet Take 1 tablet (4 mg total) by mouth every 8 (eight) hours as needed for nausea or vomiting.   pregabalin  (LYRICA ) 75 MG capsule Take 1 capsule (75 mg total) by mouth 2 (two) times daily.   rosuvastatin  (CRESTOR ) 20 MG tablet TAKE 1 TABLET BY MOUTH DAILY. GENERIC EQUIVALENT FOR CRESTOR .   traZODone  (DESYREL ) 50 MG tablet Take 0.5-1 tablets (25-50 mg total) by mouth at bedtime as needed for sleep.   VITAMIN D  PO Take 2,000 Units by mouth daily.   No facility-administered encounter medications on file as of 12/23/2023.    Allergies (verified) Prednisone    History: Past Medical History:  Diagnosis Date   B12 DEFICIENCY 02/09/2009   Qualifier: Diagnosis of  By: Joshua RN, CGRN, Sheri     Blood in stool    COLONIC POLYPS, HYPERPLASTIC, HX OF 02/03/2009  Qualifier: Diagnosis of  By: Surface RN, Arland MENDS, COLON 02/03/2009   Qualifier: Diagnosis of  By: Surface RN, Donna     Dyslipidemia 06/20/2012   Fracture, intertrochanteric, left femur (HCC) 05/02/2012   GERD (gastroesophageal reflux disease)    Hearing loss    Bil/has hearing aids   Hemorrhoids    HEMORRHOIDS 02/03/2009   Qualifier: Diagnosis of  By: Surface RN, Arland     Hypertension    INFLAMMATORY BOWEL DISEASE - Followed by Dr. Jakie in GI 02/03/2009   Qualifier: Diagnosis of  By: Surface RN, Arland Bristle    Turner's syndrome    was on Provera and  Premarin and d/c this  at age 58yr   Ulcerative colitis    Past Surgical History:  Procedure Laterality Date   COLONOSCOPY  last 03/25/2013   HERNIA REPAIR  2009   lt ing    INCISION AND DRAINAGE  2004    lt thumb dog bite   INTRAMEDULLARY (IM) NAIL INTERTROCHANTERIC  05/02/2012   Procedure: INTRAMEDULLARY (IM) NAIL INTERTROCHANTRIC;  Surgeon: Fonda SHAUNNA Olmsted, MD;  Location: [facility] OR;  Service: Orthopedics;  Laterality: Left;   ORIF FINGER FRACTURE  06/14/2011   Procedure: OPEN REDUCTION INTERNAL FIXATION (ORIF) METACARPAL (FINGER) FRACTURE;  Surgeon: Franky JONELLE Curia, MD;  Location:  SURGERY CENTER;  Service: Orthopedics;  Laterality: Right;  right ring   POLYPECTOMY     TONSILLECTOMY     UPPER GASTROINTESTINAL ENDOSCOPY     Family History  Problem Relation Age of Onset   Lymphoma Mother        nhl-mantle cell   Lymphoma Father        nhl   Healthy Sister    Melanoma Maternal Grandmother    Heart disease Maternal Grandfather    Ovarian cancer Paternal Grandmother    Pancreatic cancer Paternal Grandfather    Colon cancer Neg Hx    Esophageal cancer Neg Hx    Stomach cancer Neg Hx    Rectal cancer Neg Hx    Breast cancer Neg Hx    Colon polyps Neg Hx    Social History   Socioeconomic History   Marital status: Married    Spouse name: Not on file   Number of children: Not on file   Years of education: Not on file   Highest education level: Not on file  Occupational History   Not on file  Tobacco Use   Smoking status: Never   Smokeless tobacco: Never  Vaping Use   Vaping status: Never Used  Substance and Sexual Activity   Alcohol use: Yes    Alcohol/week: 1.0 standard drink of alcohol    Types: 1 Glasses of wine per week    Comment: occ.   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   Married and  lives in [place] with 2 daughters    Social Drivers of Corporate investment banker Strain: Low Risk  (12/23/2023)   Overall Financial Resource Strain (CARDIA)    Difficulty of Paying Living Expenses: Not hard at all  Food Insecurity: No Food Insecurity (12/23/2023)   Hunger Vital Sign    Worried About Running Out of Food in the  Last Year: Never true    Ran Out of Food in the Last Year: Never true  Transportation Needs: No Transportation Needs (12/23/2023)   PRAPARE - Administrator, Civil Service (Medical): No    Lack of Transportation (Non-Medical): No  Physical Activity: Insufficiently Active (12/23/2023)   Exercise Vital Sign    Days of Exercise per Week: 2 days    Minutes of Exercise per Session: 60 min  Stress: No Stress Concern Present (12/23/2023)   [employer] of Occupational Health - Occupational Stress Questionnaire    Feeling of Stress: Not at all  Social Connections: Socially Integrated (12/23/2023)   Social Connection and Isolation Panel    Frequency of Communication with Friends and Family: More than three times a week    Frequency of Social Gatherings with Friends and Family: Not on file    Attends Religious Services: More than 4 times per year    Active Member of [employer] or Organizations: Yes    Attends Engineer, structural: More than 4 times per year    Marital Status: Married    Tobacco Counseling Counseling given: Not Answered Pt is not a smoker, counseling not indicated.   Clinical Intake:  Pre-visit preparation completed: No  Pain : No/denies pain     BMI - recorded: 33.9 Nutritional Status: BMI > 30  Obese Nutritional Risks: None Diabetes: No     Interpreter Needed?: No      Activities of Daily Living    12/23/2023    8:41 AM 10/20/2023   12:56 AM  In your present state of health, do you have any difficulty performing the following activities:  Hearing? 1 1  Comment hears well with her hearing aids   Vision? 0 0  Difficulty  concentrating or making decisions? 0 0  Walking or climbing stairs? 1   Comment has chronic joint pain   Dressing or bathing? 0   Doing errands, shopping? 0 0  Preparing Food and eating ? N   Using the Toilet? N   In the past six months, have you accidently leaked urine? Y   Comment sees her GYN for that, is on medication   Do you have problems with loss of bowel control? N   Managing your Medications? N   Managing your Finances? N   Housekeeping or managing your Housekeeping? N     Patient Care Team: Ozell Heron HERO, MD as PCP - General (Family Medicine) Kandyce Sor, MD as Consulting Physician (Obstetrics and Gynecology) Legrand Victory LITTIE MOULD, MD as Consulting Physician (Gastroenterology)  Indicate any recent Medical Services you may have received from other than [facility] providers in the past year (date may be approximate).     Assessment:   This is a routine wellness examination for [place].  Hearing/Vision screen No results found.   Goals Addressed   None    Depression Screen    12/23/2023    8:22 AM 07/01/2023    2:30 PM 11/19/2022    8:31 AM 08/01/2022    9:01 AM 06/18/2022    2:01 PM 05/08/2022    1:21 PM 04/17/2022    4:45 PM  PHQ 2/9 Scores  PHQ - 2 Score 0 0 0 0 0 0 0  PHQ- 9 Score 2  3 0 2 1 1     Fall Risk    12/23/2023    8:22 AM 11/19/2022    8:32 AM 08/01/2022    9:03 AM 05/08/2022    1:21  PM 03/20/2022    3:39 PM  Fall Risk   Falls in the past year? 0 0 0 0 0  Number falls in past yr: 0 0 0 0 0  Injury with Fall? 0 0 0 0 0  Risk for fall due to : No Fall Risks No Fall Risks No Fall Risks No Fall Risks No Fall Risks  Follow up Falls evaluation completed Falls evaluation completed Falls prevention discussed Falls evaluation completed  Falls evaluation completed      Data saved with a previous flowsheet row definition    MEDICARE RISK AT HOME: Medicare Risk at Home Any stairs in or around the home?: Yes If so, are there any without handrails?:  Yes Home free of loose throw rugs in walkways, pet beds, electrical cords, etc?: Yes (has a throw rug in the hallway but it is secure) Adequate lighting in your home to reduce risk of falls?: Yes Life alert?: No Use of a cane, walker or w/c?: No Grab bars in the bathroom?: Yes Shower chair or bench in shower?: No Elevated toilet seat or a handicapped toilet?: Yes  TIMED UP AND GO:  Was the test performed?  No    Cognitive Function:        12/23/2023    8:43 AM 08/01/2022    9:04 AM  6CIT Screen  What Year? 0 points 0 points  What month? 0 points 0 points  What time? 0 points 0 points  Count back from 20 0 points 0 points  Months in reverse 0 points 0 points  Repeat phrase 0 points 0 points  Total Score 0 points 0 points    Immunizations Immunization History  Administered Date(s) Administered   Influenza Split 02/18/2013   Influenza, High Dose Seasonal PF 03/14/2023   Influenza-Unspecified 02/19/2016, 02/18/2017, 01/19/2018, 03/04/2020, 02/18/2021   Moderna Covid-19 Vaccine Bivalent Booster 47yrs & up 03/14/2023   PFIZER(Purple Top)SARS-COV-2 Vaccination 05/16/2019, 06/05/2019, 04/04/2020   PNEUMOCOCCAL CONJUGATE-20 03/20/2022   Rsv, Mab, [place], 1 Ml, Neonate To 24 Mos(Beyfortus) 11/02/2022   Td 05/21/1998   Td (Adult), 2 Lf Tetanus Toxid, Preservative Free 05/21/1998   Tdap 08/17/2013, 11/02/2022    TDAP status: Up to date  Flu Vaccine status: Up to date  Pneumococcal vaccine status: Up to date  Covid-19 vaccine status: Completed vaccines  Qualifies for Shingles Vaccine? Yes   Zostavax completed No   Shingrix Completed?: No.    Education has been provided regarding the importance of this vaccine. Patient has been advised to call insurance company to determine out of pocket expense if they have not yet received this vaccine. Advised may also receive vaccine at local pharmacy or Health Dept. Verbalized acceptance and understanding.  Screening  Tests Health Maintenance  Topic Date Due   Zoster Vaccines- Shingrix (1 of 2) Never done   COVID-19 Vaccine (5 - 2024-25 season) 05/09/2023   INFLUENZA VACCINE  12/20/2023   MAMMOGRAM  05/28/2024   Medicare Annual Wellness (AWV)  12/22/2024   Colonoscopy  05/19/2028   DTaP/Tdap/Td (5 - Td or Tdap) 11/01/2032   Pneumococcal Vaccine: 50+ Years  Completed   DEXA SCAN  Completed   Hepatitis C Screening  Completed   Hepatitis B Vaccines  Aged Out   HPV VACCINES  Aged Out   Meningococcal B Vaccine  Aged Out    Health Maintenance  Health Maintenance Due  Topic Date Due   Zoster Vaccines- Shingrix (1 of 2) Never done   COVID-19 Vaccine (5 - 2024-25  season) 05/09/2023   INFLUENZA VACCINE  12/20/2023    Colorectal cancer screening: Type of screening: Colonoscopy. Completed 05/19/2018. Repeat every 10 years  Mammogram status: Completed 05/28/2022. Repeat every year  Bone Density status: Completed 08/07/2021. Results reflect: Bone density results: OSTEOPENIA. Repeat every 2-4 years.  Lung Cancer Screening: (Low Dose CT Chest recommended if Age 66-80 years, 20 pack-year currently smoking OR have quit w/in 15years.) does not qualify.   Lung Cancer Screening Referral: N/A  Additional Screening:  Hepatitis C Screening: does qualify; Completed 10/20/2023  Vision Screening: Recommended annual ophthalmology exams for early detection of glaucoma and other disorders of the eye. Is the patient up to date with their annual eye exam?  Yes  Who is the provider or what is the name of the office in which the patient attends annual eye exams?  If pt is not established with a provider, would they like to be referred to a provider to establish care? No .   Dental Screening: Recommended annual dental exams for proper oral hygiene   Community Resource Referral / Chronic Care Management: CRR required this visit?  No   CCM required this visit?  No     Plan:     I have personally reviewed and  noted the following in the patient's chart:   Medical and social history Use of alcohol, tobacco or illicit drugs  Current medications and supplements including opioid prescriptions. Patient is not currently taking opioid prescriptions. Functional ability and status Nutritional status Physical activity Advanced directives List of other physicians Hospitalizations, surgeries, and ER visits in previous 12 months Vitals Screenings to include cognitive, depression, and falls Referrals and appointments  In addition, I have reviewed and discussed with patient certain preventive protocols, quality metrics, and best practice recommendations. A written personalized care plan for preventive services as well as general preventive health recommendations were provided to patient.     Heron CHRISTELLA Sharper, MD   12/23/2023   After Visit Summary: (MyChart) Due to this being a telephonic visit, the after visit summary with patients personalized plan was offered to patient via MyChart

## 2023-12-30 ENCOUNTER — Inpatient Hospital Stay: Admitting: Oncology

## 2023-12-30 ENCOUNTER — Inpatient Hospital Stay: Attending: Nurse Practitioner

## 2023-12-30 VITALS — BP 97/63 | HR 68 | Temp 97.8°F | Resp 18 | Ht <= 58 in | Wt 165.0 lb

## 2023-12-30 DIAGNOSIS — I10 Essential (primary) hypertension: Secondary | ICD-10-CM | POA: Insufficient documentation

## 2023-12-30 DIAGNOSIS — D696 Thrombocytopenia, unspecified: Secondary | ICD-10-CM | POA: Insufficient documentation

## 2023-12-30 DIAGNOSIS — D72829 Elevated white blood cell count, unspecified: Secondary | ICD-10-CM | POA: Diagnosis not present

## 2023-12-30 DIAGNOSIS — M545 Low back pain, unspecified: Secondary | ICD-10-CM | POA: Diagnosis not present

## 2023-12-30 DIAGNOSIS — M79605 Pain in left leg: Secondary | ICD-10-CM | POA: Insufficient documentation

## 2023-12-30 DIAGNOSIS — E7800 Pure hypercholesterolemia, unspecified: Secondary | ICD-10-CM | POA: Insufficient documentation

## 2023-12-30 DIAGNOSIS — G8929 Other chronic pain: Secondary | ICD-10-CM | POA: Insufficient documentation

## 2023-12-30 LAB — CBC WITH DIFFERENTIAL (CANCER CENTER ONLY)
Abs Immature Granulocytes: 0.49 K/uL — ABNORMAL HIGH (ref 0.00–0.07)
Basophils Absolute: 0.1 K/uL (ref 0.0–0.1)
Basophils Relative: 0 %
Eosinophils Absolute: 0.2 K/uL (ref 0.0–0.5)
Eosinophils Relative: 1 %
HCT: 35.8 % — ABNORMAL LOW (ref 36.0–46.0)
Hemoglobin: 11 g/dL — ABNORMAL LOW (ref 12.0–15.0)
Immature Granulocytes: 3 %
Lymphocytes Relative: 25 %
Lymphs Abs: 4 K/uL (ref 0.7–4.0)
MCH: 26.9 pg (ref 26.0–34.0)
MCHC: 30.7 g/dL (ref 30.0–36.0)
MCV: 87.5 fL (ref 80.0–100.0)
Monocytes Absolute: 4.6 K/uL — ABNORMAL HIGH (ref 0.1–1.0)
Monocytes Relative: 28 %
Neutro Abs: 7 K/uL (ref 1.7–7.7)
Neutrophils Relative %: 43 %
Platelet Count: 74 K/uL — ABNORMAL LOW (ref 150–400)
RBC: 4.09 MIL/uL (ref 3.87–5.11)
RDW: 15.7 % — ABNORMAL HIGH (ref 11.5–15.5)
WBC Count: 16.3 K/uL — ABNORMAL HIGH (ref 4.0–10.5)
nRBC: 0 % (ref 0.0–0.2)

## 2023-12-30 NOTE — Progress Notes (Signed)
  [facility] OFFICE PROGRESS NOTE   Diagnosis: Leukocytosis, thrombocytopenia  INTERVAL HISTORY:   [name] returns for a scheduled visit.  She has chronic back/hip pain.  She was placed on steroids by orthopedics.  She reports developing nausea/vomiting and a headache after the steroids were tapered off.  She saw Dr. Ozell and was prescribed Zofran .  The symptoms resolved.  She is concerned the nausea was related to prednisone .  No fever, night sweats, or anorexia.  She continues to have hip discomfort.  She is followed by orthopedics.  Objective:  Vital signs in last 24 hours:  Blood pressure 97/63, pulse 68, temperature 97.8 F (36.6 C), temperature source Temporal, resp. rate 18, height 4' 9 (1.448 m), weight 165 lb (74.8 kg), SpO2 100%.   Lymphatics: No cervical, supraclavicular, axillary, or inguinal nodes Resp: Scattered coarse end inspiratory rhonchi, no respiratory distress Cardio: Regular rate and rhythm GI: No hepatosplenomegaly Vascular: No leg edema   Lab Results:  Lab Results  Component Value Date   WBC 16.3 (H) 12/30/2023   HGB 11.0 (L) 12/30/2023   HCT 35.8 (L) 12/30/2023   MCV 87.5 12/30/2023   PLT 74 (L) 12/30/2023   NEUTROABS PENDING 12/30/2023    CMP  Lab Results  Component Value Date   NA 142 12/16/2023   K 3.8 12/16/2023   CL 106 12/16/2023   CO2 26 12/16/2023   GLUCOSE 114 (H) 12/16/2023   BUN 14 12/16/2023   CREATININE 0.70 12/16/2023   CALCIUM  9.9 12/16/2023   PROT 6.9 12/16/2023   ALBUMIN  4.4 12/16/2023   AST 19 12/16/2023   ALT 27 12/16/2023   ALKPHOS 94 12/16/2023   BILITOT 0.9 12/16/2023   GFRNONAA 54 (L) 10/31/2023   GFRAA >90 05/07/2012    Medications: I have reviewed the patient's current medications.   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 07/15/2023 abdominal ultrasound-unremarkable ultrasound of the spleen. Bone marrow biopsy 08/13/2023: Mildly hypercellular marrow, no increase in blast, reactive lymphoid aggregates, flow cytometry without a clonal B-cell population, cytogenetics-45, X,-X History of tachycardia Hypertension Hypercholesterolemia Chronic low back/left leg pain Admission 10/19/2023 with acute onset nausea/vomiting, subjective fever, and generalized weakness-acute GI viral illness suspected      Disposition: [name] is stable from a hematologic standpoint.  She has chronic monocytosis and thrombocytopenia.  She developed nausea/vomiting and a headache after receiving steroids last month.  It is possible she gastritis from the prednisone .  The platelet count was measured at 28,000 on 11/29/2023, but platelet clumping was noted and a repeat CBC the following day found the platelet count to be higher.  I have a low clinical suspicion for a lymphoproliferative disorder.  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  The plan is to continue observation.  She will call for spontaneous bleeding or bruising.  She will return for an office visit and CBC in 3 months.  Arley Hof, MD  12/30/2023  9:18 AM

## 2024-01-06 ENCOUNTER — Telehealth: Payer: Self-pay | Admitting: Orthopedic Surgery

## 2024-01-06 NOTE — Telephone Encounter (Signed)
 Pt would like to have sooner appt than what she has scheduled. Pt stated she is unable to walk.

## 2024-01-06 NOTE — Telephone Encounter (Signed)
 Leave patients appointment as I per Dr. Georgina

## 2024-01-07 ENCOUNTER — Ambulatory Visit

## 2024-01-07 DIAGNOSIS — M5459 Other low back pain: Secondary | ICD-10-CM

## 2024-01-07 DIAGNOSIS — M25652 Stiffness of left hip, not elsewhere classified: Secondary | ICD-10-CM

## 2024-01-07 DIAGNOSIS — R2681 Unsteadiness on feet: Secondary | ICD-10-CM | POA: Diagnosis not present

## 2024-01-07 DIAGNOSIS — R2689 Other abnormalities of gait and mobility: Secondary | ICD-10-CM | POA: Diagnosis not present

## 2024-01-07 DIAGNOSIS — M6281 Muscle weakness (generalized): Secondary | ICD-10-CM

## 2024-01-07 DIAGNOSIS — M25552 Pain in left hip: Secondary | ICD-10-CM

## 2024-01-07 NOTE — Therapy (Signed)
 OUTPATIENT PHYSICAL THERAPY THORACOLUMBAR EVALUATION   Patient Name: [name] MRN: [id number] DOB:05-10-1956, 68 y.o., female Today's Date: 01/07/2024  END OF SESSION:  PT End of Session - 01/07/24 1425     Visit Number 1    Number of Visits 16    Date for PT Re-Evaluation 03/03/24    Authorization Type [employer] MEDICARE $20 COPAY    Progress Note Due on Visit 10    PT Start Time 1431    PT Stop Time 1520    PT Time Calculation (min) 49 min    Activity Tolerance Patient tolerated treatment well    Behavior During Therapy [facility] for tasks assessed/performed          Past Medical History:  Diagnosis Date   B12 DEFICIENCY 02/09/2009   Qualifier: Diagnosis of  By: Joshua RN, CGRN, Sheri     Blood in stool    COLONIC POLYPS, HYPERPLASTIC, HX OF 02/03/2009   Qualifier: Diagnosis of  By: Surface RN, Arland MENDS, COLON 02/03/2009   Qualifier: Diagnosis of  By: Surface RN, Donna     Dyslipidemia 06/20/2012   Fracture, intertrochanteric, left femur (HCC) 05/02/2012   GERD (gastroesophageal reflux disease)    Hearing loss    Bil/has hearing aids   Hemorrhoids    HEMORRHOIDS 02/03/2009   Qualifier: Diagnosis of  By: Surface RN, Arland     Hypertension    INFLAMMATORY BOWEL DISEASE - Followed by Dr. Jakie in GI 02/03/2009   Qualifier: Diagnosis of  By: Surface RN, Arland Bristle    Turner's syndrome    was on Provera and Premarin and d/c this  at age 12yr   Ulcerative colitis    Past Surgical History:  Procedure Laterality Date   COLONOSCOPY  last 03/25/2013   HERNIA REPAIR  2009   lt ing    INCISION AND DRAINAGE  2004    lt thumb dog bite   INTRAMEDULLARY (IM) NAIL INTERTROCHANTERIC  05/02/2012   Procedure: INTRAMEDULLARY (IM) NAIL INTERTROCHANTRIC;  Surgeon: Fonda SHAUNNA Olmsted, MD;  Location: [facility] OR;  Service: Orthopedics;  Laterality: Left;   ORIF FINGER FRACTURE  06/14/2011   Procedure: OPEN REDUCTION INTERNAL FIXATION (ORIF) METACARPAL (FINGER) FRACTURE;   Surgeon: Franky JONELLE Curia, MD;  Location: [facility];  Service: Orthopedics;  Laterality: Right;  right ring   POLYPECTOMY     TONSILLECTOMY     UPPER GASTROINTESTINAL ENDOSCOPY     Patient Active Problem List   Diagnosis Date Noted   Hyponatremia 10/19/2023   Thrombocytopenia (HCC) 10/19/2023   Acute leg pain, left 07/19/2022   Osteopenia/?Osteoporosis - followed by her gynecologist, Dr. Kandyce 06/20/2012   Dyslipidemia 06/20/2012   H/O Turner syndrome 05/02/2012   Essential hypertension 02/03/2009   INFLAMMATORY BOWEL DISEASE - Followed by GI 02/03/2009   History of colonic polyps 02/03/2009    PCP: Heron CHRISTELLA Sharper, MD   REFERRING PROVIDER: [name], MD  REFERRING DIAG: M54.50 (ICD-10-CM) - Lumbar pain  Rationale for Evaluation and Treatment: Rehabilitation  THERAPY DIAG:  Other low back pain - Plan: PT plan of care cert/re-cert  Muscle weakness (generalized) - Plan: PT plan of care cert/re-cert  Other abnormalities of gait and mobility - Plan: PT plan of care cert/re-cert  Unsteadiness on feet - Plan: PT plan of care cert/re-cert  Pain in left hip - Plan: PT plan of care cert/re-cert  Stiffness of left hip, not elsewhere classified - Plan: PT plan  of care cert/re-cert  ONSET DATE: 4 years ago when it started; 2 years it has gotten worse   SUBJECTIVE:                                                                                                                                                                                           SUBJECTIVE STATEMENT: I'm not doing too good.   PERTINENT HISTORY:  Patient states she experienced a femur fracture in 2013 from a fall at work, but has not had any other injuries or surgeries to the area. Patient received a cortisone shot for Left hip pain then went to ER 3 days as she started having slurred speech, balance issues, and ambulation deficits. After patient was released, she went to Dr. Burnetta and  received a prednisone  taper pack which assisted her pain, but she had withdrawal-like symptoms after completing. Patient also noted she is having platelet issues and is seeing a hematologist.  PAIN:  Are you having pain? Yes: NPRS scale: 0/10 at rest, 7-8/10 at worst  Pain location: left lower back and down left LE  Pain description: burning Aggravating factors: walking Relieving factors: sitting/resting  PRECAUTIONS: None  RED FLAGS: Noting some bladder incontinence, but being followed by OB/GYN for that    WEIGHT BEARING RESTRICTIONS: No  FALLS:  Has patient fallen in last 6 months? 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  DIAGNOSTIC FINDINGS:  XRAY: showing disc at loss at L4/5 and L5/S1.  No evidence of  instability on flexion/extension views.  No fracture or dislocation seen.   MRI IMPRESSION: 1. Right eccentric disc bulge and facet arthropathy at L4-L5 resulting in moderate right neural foraminal narrowing. 2. Disc bulge at L5-S1 with lateral disc contacting the exited left L5 nerve root in the extraforaminal zone.  PATIENT SURVEYS:  PSFS: THE PATIENT SPECIFIC FUNCTIONAL SCALE  Place score of 0-10 (0 = unable to perform activity and 10 = able to perform activity at the same level as before injury or problem)  Activity Date: 01/07/2024    Balance   1    2. Walking   3    3.     4.  Total Score 2      Total Score = Sum of activity scores/number of activities  Minimally Detectable  Change: 3 points (for single activity); 2 points (for average score)  [facility] (nd). The Patient Specific Functional Scale . Retrieved from [email or web address]   COGNITION: Overall cognitive status: Within functional limits for tasks assessed     SENSATION: Light touch: WFL  MUSCLE LENGTH: Not assessed on eval  POSTURE: rounded shoulders, forward head, and increased thoracic kyphosis  PALPATION: TTP 2/10 at Left upper/lateral glute  LUMBAR ROM:   AROM Eval 01/07/2024  Flexion WFL  Extension 50%  Right lateral flexion 50-75%  Left lateral flexion 50-75%  Right rotation 50%  Left rotation 50%    (Blank rows = not tested)  LOWER EXTREMITY ROM:     ROM Right Eval 01/07/2024 Left Eval 01/07/2024  Hip flexion [facility]   Hip extension    Hip abduction    Hip adduction    Hip internal rotation    Hip external rotation    Knee flexion    Knee extension    Ankle dorsiflexion    Ankle plantarflexion    Ankle inversion    Ankle eversion     (Blank rows = not tested)  LOWER EXTREMITY MMT:    MMT Right Eval 01/07/2024 Left Eval 01/07/2024  Hip flexion (seated) 5/5 4-/5  Hip extension (prone) 3-/5 3-/5  Hip abduction (sidelying) 3-/5 3-/5  Hip adduction    Hip internal rotation    Hip external rotation    Knee flexion (seated) 5/5 4-/5  Knee extension (seated) 5/5 4/5  Ankle dorsiflexion    Ankle plantarflexion    Ankle inversion    Ankle eversion     (Blank rows = not tested)  LUMBAR SPECIAL TESTS:  Straight leg raise test: Negative, Slump test: Negative, and FABER test: Positive on L, but due to past hip fracture Clonus: negative   FUNCTIONAL TESTS:  5 times sit to stand: 10.27s  performed with SBA 4 stance balance: normal and narrow 10s, unable to complete tandem, 4s Rt 5s Lt single leg with lateral LOB and appropriate stepping reactions ; performed with CGA   GAIT: Distance walked: not  formally assessed  Assistive device utilized: None Level of assistance: SBA Comments: increased IR throughout gait with Left LE, antalgic gait pattern, Left lateral lean with Left stance  TREATMENT DATE:  01/07/2024 TherEx:  HEP handout provided with patient performing one set of each exercise for appropriate form. Verbal and tactile cues required.                                                                                                                                PATIENT EDUCATION:  Education details: HEP, POC  Person educated: Patient Education method: Explanation, Demonstration, Tactile cues, Verbal cues, and Handouts Education comprehension: verbalized understanding, returned demonstration, verbal cues required, and tactile cues required  HOME EXERCISE  PROGRAM: Access Code: [email or web address] URL: [email or web address] Date: 01/07/2024 Prepared by: Susannah Daring  Exercises - Standing Hip Abduction with Counter Support  - 1 x daily - 7 x weekly - 2 sets - 10 reps - Standing Hip Extension with Counter Support  - 1 x daily - 7 x weekly - 2 sets - 10 reps - Seated Active Straight-Leg Raise  - 1 x daily - 7 x weekly - 2 sets - 10 reps - Heel Raises with Counter Support  - 1 x daily - 7 x weekly - 2 sets - 10 reps - Mini Squat with Counter Support  - 1 x daily - 7 x weekly - 2 sets - 10 reps  ASSESSMENT:  CLINICAL IMPRESSION: Patient is a 68 y.o. F who was seen today for physical therapy evaluation and treatment for low back pain with radiating pain into left hip and LE with functional mobility deficits, strength deficits, gait abnormalities and pain with ambulation, balance deficits, and postural deficits. Patient is most limited by balance deficits and pain with ambulation. Patient has had a complicated course that has led to a hospitalization, though received HHPT for 8 visits following. Patient will benefit from skilled PT to address above noted deficits.   OBJECTIVE  IMPAIRMENTS: Abnormal gait, decreased activity tolerance, decreased balance, decreased coordination, decreased endurance, decreased mobility, difficulty walking, decreased ROM, decreased strength, improper body mechanics, postural dysfunction, and pain.   ACTIVITY LIMITATIONS: carrying, lifting, and stairs  PARTICIPATION LIMITATIONS: meal prep, cleaning, laundry, community activity, and caretaker duties  PERSONAL FACTORS: Time since onset of injury/illness/exacerbation and 3+ comorbidities: GERD, HTN, ulcerative colitis, Turner's syndrome, osteopenia, dyslipidemia are also affecting patient's functional outcome.   REHAB POTENTIAL: Good  CLINICAL DECISION MAKING: Evolving/moderate complexity  EVALUATION COMPLEXITY: Moderate   GOALS: Goals reviewed with patient? Yes  SHORT TERM GOALS: Target date: 01/28/2024  Patient will show compliance with initial HEP. Baseline: Goal status: INITIAL  2.  Patient will report pain levels no greater than 5/10 to show improved overall quality of life. Baseline:  Goal status: INITIAL   LONG TERM GOALS: Target date: 03/03/2024  Patient will show independence with final HEP in order to maintain and progress upon functional gains made within PT. Baseline:  Goal status: INITIAL  2.  Patient will report pain levels no greater than 3/10 to show improved overall quality of life. Baseline:  Goal status: INITIAL  3.  Patient will increase PSFS to at least 4 to show significant improvement in subjective disability rating. Baseline:  Goal status: INITIAL  4.  Patient will increase hip abduction MMT to at least 4-/5 in order to show improved biomechanics with functional mobility and gait.  Baseline:  Goal status: INITIAL  5.  Patient will improve 4 stance balance in order to show increase in steadiness on feet.  Baseline:  Goal status: INITIAL  PLAN:  PT FREQUENCY: 1-2x/week  PT DURATION: 8 weeks  PLANNED INTERVENTIONS: 97164- PT Re-evaluation,  97750- Physical Performance Testing, 97110-Therapeutic exercises, 97530- Therapeutic activity, [id number]- Neuromuscular re-education, 97535- Self Care, 02859- Manual therapy, [phone]- Gait training, [phone]- Electrical stimulation (unattended), [phone]- Electrical stimulation (manual), [id number]- Vasopneumatic device, [id number]- Ultrasound, [id number]- Traction (mechanical), [id number]- Ionotophoresis 4mg /ml Dexamethasone , 79439 (1-2 muscles), [id number] (3+ muscles)- Dry Needling, Patient/Family education, Balance training, Stair training, Taping, Joint mobilization, Joint manipulation, Spinal manipulation, Spinal mobilization, Scar mobilization, Vestibular training, DME instructions, Cryotherapy, and Moist heat.  PLAN FOR NEXT SESSION: static balance, in depth gait assessment, dynamic balance assessment, hip and LE strengthening  Susannah Daring, PT, DPT 01/07/24 4:23 PM

## 2024-01-10 ENCOUNTER — Other Ambulatory Visit: Payer: Medicare Other

## 2024-01-16 ENCOUNTER — Telehealth: Payer: Self-pay | Admitting: *Deleted

## 2024-01-16 DIAGNOSIS — D696 Thrombocytopenia, unspecified: Secondary | ICD-10-CM

## 2024-01-16 NOTE — Telephone Encounter (Signed)
 Called with concerns that she is bruising a lot over the past 2 days and wants to check CBC tomorrow. No obvious bleeding.  MD notified.

## 2024-01-16 NOTE — Therapy (Incomplete)
 OUTPATIENT PHYSICAL THERAPY THORACOLUMBAR TREATMENT   Patient Name: [name] MRN: [id number] DOB:Apr 07, 1956, 68 y.o., female Today's Date: 01/16/2024  END OF SESSION:    Past Medical History:  Diagnosis Date   B12 DEFICIENCY 02/09/2009   Qualifier: Diagnosis of  By: Joshua RN, CGRN, Sheri     Blood in stool    COLONIC POLYPS, HYPERPLASTIC, HX OF 02/03/2009   Qualifier: Diagnosis of  By: Surface RN, Arland MENDS, COLON 02/03/2009   Qualifier: Diagnosis of  By: Melinda RN, Donna     Dyslipidemia 06/20/2012   Fracture, intertrochanteric, left femur (HCC) 05/02/2012   GERD (gastroesophageal reflux disease)    Hearing loss    Bil/has hearing aids   Hemorrhoids    HEMORRHOIDS 02/03/2009   Qualifier: Diagnosis of  By: Surface RN, Arland     Hypertension    INFLAMMATORY BOWEL DISEASE - Followed by Dr. Jakie in GI 02/03/2009   Qualifier: Diagnosis of  By: Surface RN, Arland Bristle    Turner's syndrome    was on Provera and Premarin and d/c this  at age 81yr   Ulcerative colitis    Past Surgical History:  Procedure Laterality Date   COLONOSCOPY  last 03/25/2013   HERNIA REPAIR  2009   lt ing    INCISION AND DRAINAGE  2004    lt thumb dog bite   INTRAMEDULLARY (IM) NAIL INTERTROCHANTERIC  05/02/2012   Procedure: INTRAMEDULLARY (IM) NAIL INTERTROCHANTRIC;  Surgeon: Fonda SHAUNNA Olmsted, MD;  Location: [facility] OR;  Service: Orthopedics;  Laterality: Left;   ORIF FINGER FRACTURE  06/14/2011   Procedure: OPEN REDUCTION INTERNAL FIXATION (ORIF) METACARPAL (FINGER) FRACTURE;  Surgeon: Franky JONELLE Curia, MD;  Location: [facility];  Service: Orthopedics;  Laterality: Right;  right ring   POLYPECTOMY     TONSILLECTOMY     UPPER GASTROINTESTINAL ENDOSCOPY     Patient Active Problem List   Diagnosis Date Noted   Hyponatremia 10/19/2023   Thrombocytopenia (HCC) 10/19/2023   Acute leg pain, left 07/19/2022   Osteopenia/?Osteoporosis - followed by her gynecologist,  Dr. Kandyce 06/20/2012   Dyslipidemia 06/20/2012   H/O Turner syndrome 05/02/2012   Essential hypertension 02/03/2009   INFLAMMATORY BOWEL DISEASE - Followed by GI 02/03/2009   History of colonic polyps 02/03/2009    PCP: Heron CHRISTELLA Sharper, MD   REFERRING PROVIDER: [name], MD  REFERRING DIAG: M54.50 (ICD-10-CM) - Lumbar pain  Rationale for Evaluation and Treatment: Rehabilitation  THERAPY DIAG:  No diagnosis found.  ONSET DATE: 4 years ago when it started; 2 years it has gotten worse   SUBJECTIVE:  SUBJECTIVE STATEMENT: *** I'm not doing too good.   PERTINENT HISTORY:  Patient states she experienced a femur fracture in 2013 from a fall at work, but has not had any other injuries or surgeries to the area. Patient received a cortisone shot for Left hip pain then went to ER 3 days as she started having slurred speech, balance issues, and ambulation deficits. After patient was released, she went to Dr. Burnetta and received a prednisone  taper pack which assisted her pain, but she had withdrawal-like symptoms after completing. Patient also noted she is having platelet issues and is seeing a hematologist.  PAIN:  Are you having pain? Yes: NPRS scale: 0/10 at rest, 7-8/10 at worst  Pain location: left lower back and down left LE  Pain description: burning Aggravating factors: walking Relieving factors: sitting/resting  PRECAUTIONS: None  RED FLAGS: Noting some bladder incontinence, but being followed by OB/GYN for that    WEIGHT BEARING RESTRICTIONS: No  FALLS:  Has patient fallen in last 6 months? 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  DIAGNOSTIC FINDINGS:  XRAY: showing disc at loss at L4/5 and L5/S1.  No evidence of  instability on flexion/extension views.  No fracture or dislocation seen.   MRI IMPRESSION: 1. Right eccentric disc bulge and facet arthropathy at L4-L5 resulting in moderate right neural foraminal narrowing. 2. Disc bulge at L5-S1 with lateral disc contacting the exited left L5 nerve root in the extraforaminal zone.  PATIENT SURVEYS:  PSFS: THE PATIENT SPECIFIC FUNCTIONAL SCALE  Place score of 0-10 (0 = unable to perform activity and 10 = able to perform activity at the same level as before injury or problem)  Activity Date: 01/07/2024    Balance   1    2. Walking   3    3.     4.      Total Score 2      Total Score = Sum of activity scores/number of activities  Minimally Detectable Change: 3 points (for single activity); 2 points (for average score)  [facility] (nd). The Patient Specific Functional Scale . Retrieved from [email or web address]   COGNITION: Overall cognitive status: Within functional limits for tasks assessed     SENSATION: Light touch: WFL  MUSCLE LENGTH: Not assessed on eval  POSTURE: rounded shoulders, forward head, and increased thoracic kyphosis  PALPATION: TTP 2/10 at Left upper/lateral glute  LUMBAR ROM:   AROM Eval 01/07/2024  Flexion WFL  Extension 50%  Right lateral flexion 50-75%  Left lateral flexion 50-75%  Right rotation 50%  Left rotation 50%    (Blank rows = not tested)  LOWER EXTREMITY ROM:     ROM Right Eval 01/07/2024 Left Eval 01/07/2024  Hip  flexion [facility]   Hip extension    Hip abduction    Hip adduction    Hip internal rotation    Hip external rotation    Knee flexion    Knee extension    Ankle dorsiflexion    Ankle plantarflexion    Ankle inversion  Ankle eversion     (Blank rows = not tested)  LOWER EXTREMITY MMT:    MMT Right Eval 01/07/2024 Left Eval 01/07/2024  Hip flexion (seated) 5/5 4-/5  Hip extension (prone) 3-/5 3-/5  Hip abduction (sidelying) 3-/5 3-/5  Hip adduction    Hip internal rotation    Hip external rotation    Knee flexion (seated) 5/5 4-/5  Knee extension (seated) 5/5 4/5  Ankle dorsiflexion    Ankle plantarflexion    Ankle inversion    Ankle eversion     (Blank rows = not tested)  LUMBAR SPECIAL TESTS:  Straight leg raise test: Negative, Slump test: Negative, and FABER test: Positive on L, but due to past hip fracture Clonus: negative   FUNCTIONAL TESTS:  5 times sit to stand: 10.27s  performed with SBA 4 stance balance: normal and narrow 10s, unable to complete tandem, 4s Rt 5s Lt single leg with lateral LOB and appropriate stepping reactions ; performed with CGA   GAIT: Distance walked: not formally assessed  Assistive device utilized: None Level of assistance: SBA Comments: increased IR throughout gait with Left LE, antalgic gait pattern, Left lateral lean with Left stance  TREATMENT DATE:  01/17/2024 ***  01/07/2024 TherEx:  HEP handout provided with patient performing one set of each exercise for appropriate form. Verbal and tactile cues required.                                                                                                                                PATIENT EDUCATION:  Education details: HEP, POC  Person educated: Patient Education method: Explanation, Demonstration, Tactile cues, Verbal cues, and Handouts Education comprehension: verbalized understanding, returned demonstration, verbal cues required, and tactile cues required  HOME  EXERCISE PROGRAM: Access Code: [email or web address] URL: [email or web address] Date: 01/07/2024 Prepared by: Susannah Daring  Exercises - Standing Hip Abduction with Counter Support  - 1 x daily - 7 x weekly - 2 sets - 10 reps - Standing Hip Extension with Counter Support  - 1 x daily - 7 x weekly - 2 sets - 10 reps - Seated Active Straight-Leg Raise  - 1 x daily - 7 x weekly - 2 sets - 10 reps - Heel Raises with Counter Support  - 1 x daily - 7 x weekly - 2 sets - 10 reps - Mini Squat with Counter Support  - 1 x daily - 7 x weekly - 2 sets - 10 reps  ASSESSMENT:  CLINICAL IMPRESSION: *** Patient is a 68 y.o. F who was seen today for physical therapy evaluation and treatment for low back pain with radiating pain into left hip and LE with functional mobility deficits, strength deficits, gait abnormalities and pain with ambulation, balance deficits, and postural deficits. Patient is most limited by balance deficits and pain with ambulation. Patient has had a complicated course that has led to a hospitalization, though received HHPT for 8 visits following. Patient will  benefit from skilled PT to address above noted deficits.   OBJECTIVE IMPAIRMENTS: Abnormal gait, decreased activity tolerance, decreased balance, decreased coordination, decreased endurance, decreased mobility, difficulty walking, decreased ROM, decreased strength, improper body mechanics, postural dysfunction, and pain.   ACTIVITY LIMITATIONS: carrying, lifting, and stairs  PARTICIPATION LIMITATIONS: meal prep, cleaning, laundry, community activity, and caretaker duties  PERSONAL FACTORS: Time since onset of injury/illness/exacerbation and 3+ comorbidities: GERD, HTN, ulcerative colitis, Turner's syndrome, osteopenia, dyslipidemia are also affecting patient's functional outcome.   REHAB POTENTIAL: Good  CLINICAL DECISION MAKING: Evolving/moderate complexity  EVALUATION COMPLEXITY: Moderate   GOALS: Goals reviewed with  patient? Yes  SHORT TERM GOALS: Target date: 01/28/2024  Patient will show compliance with initial HEP. Baseline: Goal status: INITIAL  2.  Patient will report pain levels no greater than 5/10 to show improved overall quality of life. Baseline:  Goal status: INITIAL   LONG TERM GOALS: Target date: 03/03/2024  Patient will show independence with final HEP in order to maintain and progress upon functional gains made within PT. Baseline:  Goal status: INITIAL  2.  Patient will report pain levels no greater than 3/10 to show improved overall quality of life. Baseline:  Goal status: INITIAL  3.  Patient will increase PSFS to at least 4 to show significant improvement in subjective disability rating. Baseline:  Goal status: INITIAL  4.  Patient will increase hip abduction MMT to at least 4-/5 in order to show improved biomechanics with functional mobility and gait.  Baseline:  Goal status: INITIAL  5.  Patient will improve 4 stance balance in order to show increase in steadiness on feet.  Baseline:  Goal status: INITIAL  PLAN:  PT FREQUENCY: 1-2x/week  PT DURATION: 8 weeks  PLANNED INTERVENTIONS: 97164- PT Re-evaluation, 97750- Physical Performance Testing, 97110-Therapeutic exercises, 97530- Therapeutic activity, [id number]- Neuromuscular re-education, 97535- Self Care, 02859- Manual therapy, [phone]- Gait training, [phone]- Electrical stimulation (unattended), [phone]- Electrical stimulation (manual), [id number]- Vasopneumatic device, [id number]- Ultrasound, [id number]- Traction (mechanical), [id number]- Ionotophoresis 4mg /ml Dexamethasone , 79439 (1-2 muscles), 20561 (3+ muscles)- Dry Needling, Patient/Family education, Balance training, Stair training, Taping, Joint mobilization, Joint manipulation, Spinal manipulation, Spinal mobilization, Scar mobilization, Vestibular training, DME instructions, Cryotherapy, and Moist heat.  PLAN FOR NEXT SESSION: *** static balance, in depth gait assessment, dynamic  balance assessment, hip and LE strengthening    Susannah Daring, PT, DPT 01/16/24 9:04 AM

## 2024-01-17 ENCOUNTER — Telehealth: Payer: Self-pay

## 2024-01-17 ENCOUNTER — Inpatient Hospital Stay

## 2024-01-17 ENCOUNTER — Telehealth: Payer: Self-pay | Admitting: *Deleted

## 2024-01-17 DIAGNOSIS — D696 Thrombocytopenia, unspecified: Secondary | ICD-10-CM | POA: Diagnosis not present

## 2024-01-17 DIAGNOSIS — M79605 Pain in left leg: Secondary | ICD-10-CM | POA: Diagnosis not present

## 2024-01-17 DIAGNOSIS — G8929 Other chronic pain: Secondary | ICD-10-CM | POA: Diagnosis not present

## 2024-01-17 DIAGNOSIS — I10 Essential (primary) hypertension: Secondary | ICD-10-CM | POA: Diagnosis not present

## 2024-01-17 DIAGNOSIS — M545 Low back pain, unspecified: Secondary | ICD-10-CM | POA: Diagnosis not present

## 2024-01-17 DIAGNOSIS — E7800 Pure hypercholesterolemia, unspecified: Secondary | ICD-10-CM | POA: Diagnosis not present

## 2024-01-17 DIAGNOSIS — D72829 Elevated white blood cell count, unspecified: Secondary | ICD-10-CM | POA: Diagnosis not present

## 2024-01-17 LAB — CBC WITH DIFFERENTIAL (CANCER CENTER ONLY)
Abs Immature Granulocytes: 0.16 K/uL — ABNORMAL HIGH (ref 0.00–0.07)
Basophils Absolute: 0 K/uL (ref 0.0–0.1)
Basophils Relative: 0 %
Eosinophils Absolute: 0.1 K/uL (ref 0.0–0.5)
Eosinophils Relative: 1 %
HCT: 37.7 % (ref 36.0–46.0)
Hemoglobin: 11.7 g/dL — ABNORMAL LOW (ref 12.0–15.0)
Immature Granulocytes: 2 %
Lymphocytes Relative: 26 %
Lymphs Abs: 2.6 K/uL (ref 0.7–4.0)
MCH: 26.7 pg (ref 26.0–34.0)
MCHC: 31 g/dL (ref 30.0–36.0)
MCV: 85.9 fL (ref 80.0–100.0)
Monocytes Absolute: 2.7 K/uL — ABNORMAL HIGH (ref 0.1–1.0)
Monocytes Relative: 27 %
Neutro Abs: 4.4 K/uL (ref 1.7–7.7)
Neutrophils Relative %: 44 %
Platelet Count: 57 K/uL — ABNORMAL LOW (ref 150–400)
RBC: 4.39 MIL/uL (ref 3.87–5.11)
RDW: 15.9 % — ABNORMAL HIGH (ref 11.5–15.5)
WBC Count: 10 K/uL (ref 4.0–10.5)
nRBC: 0 % (ref 0.0–0.2)

## 2024-01-17 NOTE — Telephone Encounter (Signed)
 MD reviewed CBC today-platelets stable. Call for bleeding and f/u as scheduled on 11/11. [name] notified and agrees to call if she has any bleeding.

## 2024-01-17 NOTE — Telephone Encounter (Signed)
 PT contacted patient and spoke about no-show for today's appointment at 1145. Patient endorses that she accidentally wrote 145 down by accident on her calendar. PT reminded patient about next appointment on 01/23/2024 at 0930.   Susannah Daring, PT, DPT 01/17/24 12:21 PM

## 2024-01-23 ENCOUNTER — Encounter: Payer: Self-pay | Admitting: Rehabilitative and Restorative Service Providers"

## 2024-01-23 ENCOUNTER — Ambulatory Visit: Admitting: Rehabilitative and Restorative Service Providers"

## 2024-01-23 DIAGNOSIS — R2681 Unsteadiness on feet: Secondary | ICD-10-CM | POA: Diagnosis not present

## 2024-01-23 DIAGNOSIS — M25552 Pain in left hip: Secondary | ICD-10-CM

## 2024-01-23 DIAGNOSIS — M5459 Other low back pain: Secondary | ICD-10-CM

## 2024-01-23 DIAGNOSIS — R2689 Other abnormalities of gait and mobility: Secondary | ICD-10-CM

## 2024-01-23 DIAGNOSIS — M6281 Muscle weakness (generalized): Secondary | ICD-10-CM | POA: Diagnosis not present

## 2024-01-23 DIAGNOSIS — M25652 Stiffness of left hip, not elsewhere classified: Secondary | ICD-10-CM

## 2024-01-23 NOTE — Therapy (Signed)
 OUTPATIENT PHYSICAL THERAPY THORACOLUMBAR TREATMENT   Patient Name: [name] MRN: [id number] DOB:March 29, 1956, 68 y.o., female Today's Date: 01/23/2024  END OF SESSION:  PT End of Session - 01/23/24 0934     Visit Number 2    Number of Visits 16    Date for PT Re-Evaluation 03/03/24    Authorization Type [employer] MEDICARE $20 COPAY    Progress Note Due on Visit 10    PT Start Time 0933    PT Stop Time 1018    PT Time Calculation (min) 45 min    Activity Tolerance Patient tolerated treatment well;No increased pain    Behavior During Therapy [facility] for tasks assessed/performed           Past Medical History:  Diagnosis Date   B12 DEFICIENCY 02/09/2009   Qualifier: Diagnosis of  By: Joshua RN, CGRN, Sheri     Blood in stool    COLONIC POLYPS, HYPERPLASTIC, HX OF 02/03/2009   Qualifier: Diagnosis of  By: Surface RN, Arland MENDS, COLON 02/03/2009   Qualifier: Diagnosis of  By: Surface RN, Donna     Dyslipidemia 06/20/2012   Fracture, intertrochanteric, left femur (HCC) 05/02/2012   GERD (gastroesophageal reflux disease)    Hearing loss    Bil/has hearing aids   Hemorrhoids    HEMORRHOIDS 02/03/2009   Qualifier: Diagnosis of  By: Surface RN, Arland     Hypertension    INFLAMMATORY BOWEL DISEASE - Followed by Dr. Jakie in GI 02/03/2009   Qualifier: Diagnosis of  By: Surface RN, Arland Bristle    Turner's syndrome    was on Provera and Premarin and d/c this  at age 14yr   Ulcerative colitis    Past Surgical History:  Procedure Laterality Date   COLONOSCOPY  last 03/25/2013   HERNIA REPAIR  2009   lt ing    INCISION AND DRAINAGE  2004    lt thumb dog bite   INTRAMEDULLARY (IM) NAIL INTERTROCHANTERIC  05/02/2012   Procedure: INTRAMEDULLARY (IM) NAIL INTERTROCHANTRIC;  Surgeon: Fonda SHAUNNA Olmsted, MD;  Location: [facility] OR;  Service: Orthopedics;  Laterality: Left;   ORIF FINGER FRACTURE  06/14/2011   Procedure: OPEN REDUCTION INTERNAL FIXATION (ORIF) METACARPAL  (FINGER) FRACTURE;  Surgeon: Franky JONELLE Curia, MD;  Location: [facility];  Service: Orthopedics;  Laterality: Right;  right ring   POLYPECTOMY     TONSILLECTOMY     UPPER GASTROINTESTINAL ENDOSCOPY     Patient Active Problem List   Diagnosis Date Noted   Hyponatremia 10/19/2023   Thrombocytopenia (HCC) 10/19/2023   Acute leg pain, left 07/19/2022   Osteopenia/?Osteoporosis - followed by her gynecologist, Dr. Kandyce 06/20/2012   Dyslipidemia 06/20/2012   H/O Turner syndrome 05/02/2012   Essential hypertension 02/03/2009   INFLAMMATORY BOWEL DISEASE - Followed by GI 02/03/2009   History of colonic polyps 02/03/2009    PCP: Heron CHRISTELLA Sharper, MD   REFERRING PROVIDER: [name], MD  REFERRING DIAG: M54.50 (ICD-10-CM) - Lumbar pain  Rationale for Evaluation and Treatment: Rehabilitation  THERAPY DIAG:  Other low back pain  Muscle weakness (generalized)  Other abnormalities of gait and mobility  Unsteadiness on feet  Pain in left hip  Stiffness of left hip, not elsewhere classified  ONSET DATE: 4 years ago when it started; 2 years it has gotten worse   SUBJECTIVE:  SUBJECTIVE STATEMENT: Yuka notes difficulty with standing and walking.  She does much better with a shopping cart or baby carriage with standing and walking due to the upper extremity support.  Sitting, she is fine, although we discussed it is better for her to change positions frequently during the day and use a lumbar support when sitting.  PERTINENT HISTORY:  Patient states she experienced a femur fracture in 2013 from a fall at work, but has not had any other injuries or surgeries to the area. Patient received a cortisone shot for Left hip pain then went to ER 3 days as she started having slurred speech,  balance issues, and ambulation deficits. After patient was released, she went to Dr. Burnetta and received a prednisone  taper pack which assisted her pain, but she had withdrawal-like symptoms after completing. Patient also noted she is having platelet issues and is seeing a hematologist.  PAIN:  Are you having pain? Yes: NPRS scale: 0/10 at rest, 7-8/10 at worst  Pain location: left lower back and down left LE  Pain description: burning Aggravating factors: walking Relieving factors: sitting/resting  PRECAUTIONS: None  RED FLAGS: Noting some bladder incontinence, but being followed by OB/GYN for that    WEIGHT BEARING RESTRICTIONS: No  FALLS:  Has patient fallen in last 6 months? 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  DIAGNOSTIC FINDINGS:  XRAY: showing disc at loss at L4/5 and L5/S1.  No evidence of  instability on flexion/extension views.  No fracture or dislocation seen.   MRI IMPRESSION: 1. Right eccentric disc bulge and facet arthropathy at L4-L5 resulting in moderate right neural foraminal narrowing. 2. Disc bulge at L5-S1 with lateral disc contacting the exited left L5 nerve root in the extraforaminal zone.  PATIENT SURVEYS:  PSFS: THE PATIENT SPECIFIC FUNCTIONAL SCALE  Place score of 0-10 (0 = unable to perform activity and 10 = able to perform activity at the same level as before injury or problem)  Activity Date: 01/07/2024    Balance   1    2. Walking   3    3.     4.      Total Score 2       Total Score = Sum of activity scores/number of activities  Minimally Detectable Change: 3 points (for single activity); 2 points (for average score)  [facility] (nd). The Patient Specific Functional Scale . Retrieved from [email or web address]   COGNITION: Overall cognitive status: Within functional limits for tasks assessed     SENSATION: Light touch: WFL  MUSCLE LENGTH: Not assessed on eval  POSTURE: rounded shoulders, forward head, and increased thoracic kyphosis  PALPATION: TTP 2/10 at Left upper/lateral glute  LUMBAR ROM:   AROM Eval 01/07/2024  Flexion WFL  Extension 50%  Right lateral flexion 50-75%  Left lateral flexion 50-75%  Right rotation 50%  Left rotation 50%    (Blank rows = not tested)  LOWER EXTREMITY ROM:     ROM Right Eval 01/07/2024 Left Eval 01/07/2024  Hip flexion [facility]   Hip extension  Hip abduction    Hip adduction    Hip internal rotation    Hip external rotation    Knee flexion    Knee extension    Ankle dorsiflexion    Ankle plantarflexion    Ankle inversion    Ankle eversion     (Blank rows = not tested)  LOWER EXTREMITY MMT:    MMT Right Eval 01/07/2024 Left Eval 01/07/2024  Hip flexion (seated) 5/5 4-/5  Hip extension (prone) 3-/5 3-/5  Hip abduction (sidelying) 3-/5 3-/5  Hip adduction    Hip internal rotation    Hip external rotation    Knee flexion (seated) 5/5 4-/5  Knee extension (seated) 5/5 4/5  Ankle dorsiflexion    Ankle plantarflexion    Ankle inversion    Ankle eversion     (Blank rows = not tested)  LUMBAR SPECIAL TESTS:  Straight leg raise test: Negative, Slump test: Negative, and FABER test: Positive on L, but due to past hip fracture Clonus: negative   FUNCTIONAL TESTS:  5 times sit to stand: 10.27s  performed with SBA 4 stance balance: normal and narrow 10s, unable to complete tandem, 4s Rt 5s Lt single leg with lateral LOB  and appropriate stepping reactions ; performed with CGA   GAIT: Distance walked: not formally assessed  Assistive device utilized: None Level of assistance: SBA Comments: increased IR throughout gait with Left LE, antalgic gait pattern, Left lateral lean with Left stance  TREATMENT DATE:  01/17/2024 Lumbar extension AROM 10 x 3 seconds Standing hip extensions 10 x 3 seconds Standing alternating hip abduction 2 sets of 10 for 3 seconds  Functional Activities: Hip hike in door frame 2 sets of 5 for 3 seconds Prone hip extension (alternate) 10 x 3 seconds  [id number]: Reviewed spine imaging; spine anatomy with model; practical bed mobility and posture with house chores (dishes); lumbar roll use; disc pressure in various postures   01/07/2024 TherEx:  HEP handout provided with patient performing one set of each exercise for appropriate form. Verbal and tactile cues required.                                                                                                                                PATIENT EDUCATION:  Education details: HEP, POC  Person educated: Patient Education method: Explanation, Demonstration, Tactile cues, Verbal cues, and Handouts Education comprehension: verbalized understanding, returned demonstration, verbal cues required, and tactile cues required  HOME EXERCISE PROGRAM: Access Code: [email or web address] URL: [email or web address] Date: 01/23/2024 Prepared by: Lamar Ivory  Exercises - Standing Hip Abduction with Counter Support  - 1 x daily - 7 x weekly - 2 sets - 10 reps - Standing Hip Extension with Counter Support  - 1 x daily - 7 x weekly - 2 sets - 10 reps - Seated Active Straight-Leg Raise  - 1 x daily - 7 x weekly - 2 sets - 10 reps - Heel Raises with  Counter Support  - 1 x daily - 7 x weekly - 2 sets - 10 reps - Mini Squat with Counter Support  - 1 x daily - 7 x weekly - 2 sets - 10 reps - Standing Lumbar Extension at Wall - Forearms  - 5 x  daily - 7 x weekly - 1 sets - 5 reps - 3 seconds hold - Standing Hip Hiking  - 5 x daily - 7 x weekly - 1 sets - 5 reps - 3 seconds hold - Prone Hip Extension  - 2 x daily - 7 x weekly - 1-2 sets - 10 reps - 3 seconds hold  ASSESSMENT:  CLINICAL IMPRESSION: Kymberlee did a good job of recalling her exercises from her first visit.  We reviewed these activities and made progressions to her hip abductors and erector spinae strengthening.  Jazmene is more comfortable sitting, although we discussed that is important to change position during the day and we reviewed disc pressures and various postures.  Recurrent low back pain and sciatica is in the small part related to her previous femur fracture and resulting long-term hip abductor's weakness.  We also added a lumbar extension activity to help reduce disc pressure on the lumbar nerve roots.  She would benefit from a recommended course of physical therapy.  Patient is a 68 y.o. F who was seen today for physical therapy evaluation and treatment for low back pain with radiating pain into left hip and LE with functional mobility deficits, strength deficits, gait abnormalities and pain with ambulation, balance deficits, and postural deficits. Patient is most limited by balance deficits and pain with ambulation. Patient has had a complicated course that has led to a hospitalization, though received HHPT for 8 visits following. Patient will benefit from skilled PT to address above noted deficits.   OBJECTIVE IMPAIRMENTS: Abnormal gait, decreased activity tolerance, decreased balance, decreased coordination, decreased endurance, decreased mobility, difficulty walking, decreased ROM, decreased strength, improper body mechanics, postural dysfunction, and pain.   ACTIVITY LIMITATIONS: carrying, lifting, and stairs  PARTICIPATION LIMITATIONS: meal prep, cleaning, laundry, community activity, and caretaker duties  PERSONAL FACTORS: Time since onset of  injury/illness/exacerbation and 3+ comorbidities: GERD, HTN, ulcerative colitis, Turner's syndrome, osteopenia, dyslipidemia are also affecting patient's functional outcome.   REHAB POTENTIAL: Good  CLINICAL DECISION MAKING: Evolving/moderate complexity  EVALUATION COMPLEXITY: Moderate   GOALS: Goals reviewed with patient? Yes  SHORT TERM GOALS: Target date: 01/28/2024  Patient will show compliance with initial HEP. Baseline: Goal status: Met 01/23/2024  2.  Patient will report pain levels no greater than 5/10 to show improved overall quality of life. Baseline:  Goal status: INITIAL   LONG TERM GOALS: Target date: 03/03/2024  Patient will show independence with final HEP in order to maintain and progress upon functional gains made within PT. Baseline:  Goal status: INITIAL  2.  Patient will report pain levels no greater than 3/10 to show improved overall quality of life. Baseline:  Goal status: INITIAL  3.  Patient will increase PSFS to at least 4 to show significant improvement in subjective disability rating. Baseline:  Goal status: INITIAL  4.  Patient will increase hip abduction MMT to at least 4-/5 in order to show improved biomechanics with functional mobility and gait.  Baseline:  Goal status: INITIAL  5.  Patient will improve 4 stance balance in order to show increase in steadiness on feet.  Baseline:  Goal status: INITIAL  PLAN:  PT FREQUENCY: 1-2x/week  PT DURATION: 8  weeks  PLANNED INTERVENTIONS: 97164- PT Re-evaluation, 97750- Physical Performance Testing, 97110-Therapeutic exercises, 97530- Therapeutic activity, [id number]- Neuromuscular re-education, 97535- Self Care, [id number]- Manual therapy, [phone]- Gait training, [phone]- Electrical stimulation (unattended), [phone]- Electrical stimulation (manual), [id number]- Vasopneumatic device, [id number]- Ultrasound, [id number]- Traction (mechanical), [id number]- Ionotophoresis 4mg /ml Dexamethasone , 79439 (1-2 muscles), 20561 (3+ muscles)- Dry  Needling, Patient/Family education, Balance training, Stair training, Taping, Joint mobilization, Joint manipulation, Spinal manipulation, Spinal mobilization, Scar mobilization, Vestibular training, DME instructions, Cryotherapy, and Moist heat.  PLAN FOR NEXT SESSION: Emphasis on reducing the lumbar disc (extension), hip abductors, erector spinae and quadratus lumborum strength work to improve endurance with standing and walking.  Static balance, in depth gait assessment, dynamic balance assessment, hip and LE strengthening.   Myer LELON Ivory, PT, MPT 01/23/24 1:24 PM

## 2024-01-30 NOTE — Therapy (Signed)
 OUTPATIENT PHYSICAL THERAPY THORACOLUMBAR TREATMENT   Patient Name: [name] MRN: [id number] DOB:1955-07-10, 68 y.o., female Today's Date: 01/31/2024  END OF SESSION:  PT End of Session - 01/31/24 0850     Visit Number 3    Number of Visits 16    Date for PT Re-Evaluation 03/03/24    Authorization Type [employer] MEDICARE $20 COPAY    Progress Note Due on Visit 10    PT Start Time 0850    PT Stop Time 0935    PT Time Calculation (min) 45 min    Activity Tolerance Patient tolerated treatment well;No increased pain    Behavior During Therapy [facility] for tasks assessed/performed            Past Medical History:  Diagnosis Date   B12 DEFICIENCY 02/09/2009   Qualifier: Diagnosis of  By: Joshua RN, CGRN, Sheri     Blood in stool    COLONIC POLYPS, HYPERPLASTIC, HX OF 02/03/2009   Qualifier: Diagnosis of  By: Surface RN, Arland MENDS, COLON 02/03/2009   Qualifier: Diagnosis of  By: Surface RN, Donna     Dyslipidemia 06/20/2012   Fracture, intertrochanteric, left femur (HCC) 05/02/2012   GERD (gastroesophageal reflux disease)    Hearing loss    Bil/has hearing aids   Hemorrhoids    HEMORRHOIDS 02/03/2009   Qualifier: Diagnosis of  By: Surface RN, Arland     Hypertension    INFLAMMATORY BOWEL DISEASE - Followed by Dr. Jakie in GI 02/03/2009   Qualifier: Diagnosis of  By: Surface RN, Arland Bristle    Turner's syndrome    was on Provera and Premarin and d/c this  at age 13yr   Ulcerative colitis    Past Surgical History:  Procedure Laterality Date   COLONOSCOPY  last 03/25/2013   HERNIA REPAIR  2009   lt ing    INCISION AND DRAINAGE  2004    lt thumb dog bite   INTRAMEDULLARY (IM) NAIL INTERTROCHANTERIC  05/02/2012   Procedure: INTRAMEDULLARY (IM) NAIL INTERTROCHANTRIC;  Surgeon: Fonda SHAUNNA Olmsted, MD;  Location: [facility] OR;  Service: Orthopedics;  Laterality: Left;   ORIF FINGER FRACTURE  06/14/2011   Procedure: OPEN REDUCTION INTERNAL FIXATION (ORIF) METACARPAL  (FINGER) FRACTURE;  Surgeon: Franky JONELLE Curia, MD;  Location: [facility];  Service: Orthopedics;  Laterality: Right;  right ring   POLYPECTOMY     TONSILLECTOMY     UPPER GASTROINTESTINAL ENDOSCOPY     Patient Active Problem List   Diagnosis Date Noted   Hyponatremia 10/19/2023   Thrombocytopenia (HCC) 10/19/2023   Acute leg pain, left 07/19/2022   Osteopenia/?Osteoporosis - followed by her gynecologist, Dr. Kandyce 06/20/2012   Dyslipidemia 06/20/2012   H/O Turner syndrome 05/02/2012   Essential hypertension 02/03/2009   INFLAMMATORY BOWEL DISEASE - Followed by GI 02/03/2009   History of colonic polyps 02/03/2009    PCP: Heron CHRISTELLA Sharper, MD   REFERRING PROVIDER: [name], MD  REFERRING DIAG: M54.50 (ICD-10-CM) - Lumbar pain  Rationale for Evaluation and Treatment: Rehabilitation  THERAPY DIAG:  Other low back pain  Muscle weakness (generalized)  Other abnormalities of gait and mobility  Unsteadiness on feet  Pain in left hip  Stiffness of left hip, not elsewhere classified  ONSET DATE: 4 years ago when it started; 2 years it has gotten worse   SUBJECTIVE:  SUBJECTIVE STATEMENT: Patient arriving noting never painless, just having an annoying pain 2/10 this morning.  PERTINENT HISTORY:  Patient states she experienced a femur fracture in 2013 from a fall at work, but has not had any other injuries or surgeries to the area. Patient received a cortisone shot for Left hip pain then went to ER 3 days as she started having slurred speech, balance issues, and ambulation deficits. After patient was released, she went to Dr. Burnetta and received a prednisone  taper pack which assisted her pain, but she had withdrawal-like symptoms after completing. Patient also noted she is  having platelet issues and is seeing a hematologist.  PAIN:  Are you having pain? Yes: NPRS scale: 0/10 at rest, 7-8/10 at worst  Pain location: left lower back and down left LE  Pain description: burning Aggravating factors: walking Relieving factors: sitting/resting  PRECAUTIONS: None  RED FLAGS: Noting some bladder incontinence, but being followed by OB/GYN for that    WEIGHT BEARING RESTRICTIONS: No  FALLS:  Has patient fallen in last 6 months? 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  DIAGNOSTIC FINDINGS:  XRAY: showing disc at loss at L4/5 and L5/S1.  No evidence of  instability on flexion/extension views.  No fracture or dislocation seen.   MRI IMPRESSION: 1. Right eccentric disc bulge and facet arthropathy at L4-L5 resulting in moderate right neural foraminal narrowing. 2. Disc bulge at L5-S1 with lateral disc contacting the exited left L5 nerve root in the extraforaminal zone.  PATIENT SURVEYS:  PSFS: THE PATIENT SPECIFIC FUNCTIONAL SCALE  Place score of 0-10 (0 = unable to perform activity and 10 = able to perform activity at the same level as before injury or problem)  Activity Date: 01/07/2024    Balance   1    2. Walking   3    3.     4.      Total Score 2      Total Score = Sum of activity scores/number of activities  Minimally Detectable Change: 3 points (for single activity); 2 points (for average score)  [facility] (nd). The Patient Specific Functional Scale .  Retrieved from [email or web address]   COGNITION: Overall cognitive status: Within functional limits for tasks assessed     SENSATION: Light touch: WFL  MUSCLE LENGTH: Not assessed on eval  POSTURE: rounded shoulders, forward head, and increased thoracic kyphosis  PALPATION: TTP 2/10 at Left upper/lateral glute  LUMBAR ROM:   AROM Eval 01/07/2024  Flexion WFL  Extension 50%  Right lateral flexion 50-75%  Left lateral flexion 50-75%  Right rotation 50%  Left rotation 50%    (Blank rows = not tested)  LOWER EXTREMITY ROM:     ROM Right Eval 01/07/2024 Left Eval 01/07/2024  Hip flexion [facility]   Hip extension    Hip abduction    Hip adduction    Hip internal rotation    Hip external rotation    Knee flexion    Knee extension    Ankle dorsiflexion    Ankle plantarflexion  Ankle inversion    Ankle eversion     (Blank rows = not tested)  LOWER EXTREMITY MMT:    MMT Right Eval 01/07/2024 Left Eval 01/07/2024  Hip flexion (seated) 5/5 4-/5  Hip extension (prone) 3-/5 3-/5  Hip abduction (sidelying) 3-/5 3-/5  Hip adduction    Hip internal rotation    Hip external rotation    Knee flexion (seated) 5/5 4-/5  Knee extension (seated) 5/5 4/5  Ankle dorsiflexion    Ankle plantarflexion    Ankle inversion    Ankle eversion     (Blank rows = not tested)  LUMBAR SPECIAL TESTS:  Straight leg raise test: Negative, Slump test: Negative, and FABER test: Positive on L, but due to past hip fracture Clonus: negative   FUNCTIONAL TESTS:  5 times sit to stand: 10.27s  performed with SBA 4 stance balance: normal and narrow 10s, unable to complete tandem, 4s Rt 5s Lt single leg with lateral LOB and appropriate stepping reactions ; performed with CGA   GAIT: Distance walked: not formally assessed  Assistive device utilized: None Level of assistance: SBA Comments: increased IR throughout gait with Left LE, antalgic  gait pattern, Left lateral lean with Left stance  TREATMENT DATE:  01/30/2024 TherAct:  Standing hip hikes in parallel bars using mirror for visual feedback for lateral lean 2x10 each leg  Step ups using 4 step 1x10 with Rt leg, 1x6 with Lt leg with patient noting difficulty/pain with LT  Switched to 2 step to finish Lt leg  PT added to HEP with use of handrail for larger steps and to perform only if pain free  Tap downs from 2 step 1x10 each leg   Neuro Re-Ed:  Modified tandem stance in parallel bars with CGA 2x30s each leg  Patient requiring intermittent UE use on parallel bars due to lateral LOB  Narrow stance with vertical head turns in parallel bars with CGA 1x20 head turns Patient requiring intermittent UE use on parallel bars due to lateral LOB  Narrow stance with horizontal head turns in parallel bars with CGA 1x20 head turns  Patient requiring intermittent UE use on parallel bars due to lateral LOB  Taps to cone in parallel bars with CGA 1x10 with high difficulty  Patient requiring intermittent UE use on parallel bars due to lateral LOB  Taps to step in parallel bars with SBA 1x10 with decreased difficulty  Patient requiring intermittent UE use on parallel bars due to lateral LOB  Discussed 3 balance systems (proprioception, vision, vestibular) and balance strategies (ankle, hip, step) PT added corner narrow stance balance to HEP  01/17/2024 Lumbar extension AROM 10 x 3 seconds Standing hip extensions 10 x 3 seconds Standing alternating hip abduction 2 sets of 10 for 3 seconds  Functional Activities: Hip hike in door frame 2 sets of 5 for 3 seconds Prone hip extension (alternate) 10 x 3 seconds  [id number]: Reviewed spine imaging; spine anatomy with model; practical bed mobility and posture with house chores (dishes); lumbar roll use; disc pressure in various postures   01/07/2024 TherEx:  HEP handout provided with patient performing one set of each exercise for  appropriate form. Verbal and tactile cues required.  PATIENT EDUCATION:  Education details: HEP, POC  Person educated: Patient Education method: Explanation, Demonstration, Tactile cues, Verbal cues, and Handouts Education comprehension: verbalized understanding, returned demonstration, verbal cues required, and tactile cues required  HOME EXERCISE PROGRAM: Access Code: [email or web address] URL: [email or web address] Date: 01/31/2024 Prepared by: Susannah Daring  Exercises - Standing Hip Abduction with Counter Support  - 1 x daily - 7 x weekly - 2 sets - 10 reps - Standing Hip Extension with Counter Support  - 1 x daily - 7 x weekly - 2 sets - 10 reps - Seated Active Straight-Leg Raise  - 1 x daily - 7 x weekly - 2 sets - 10 reps - Heel Raises with Counter Support  - 1 x daily - 7 x weekly - 2 sets - 10 reps - Mini Squat with Counter Support  - 1 x daily - 7 x weekly - 2 sets - 10 reps - Standing Lumbar Extension at Wall - Forearms  - 5 x daily - 7 x weekly - 1 sets - 5 reps - 3 seconds hold - Standing Hip Hiking  - 5 x daily - 7 x weekly - 1 sets - 5 reps - 3 seconds hold - Prone Hip Extension  - 2 x daily - 7 x weekly - 1-2 sets - 10 reps - 3 seconds hold - Romberg Stance with Head Rotation  - 1 x daily - 7 x weekly - 2 sets - 10 reps - Romberg Stance with Eyes Closed  - 1 x daily - 7 x weekly - 2 sets - 10 reps - Step Up  - 1 x daily - 7 x weekly - 2 sets - 10 reps - Standing Forward Step Taps with Counter Support  - 1 x daily - 7 x weekly - 2 sets - 10 reps  ASSESSMENT:  CLINICAL IMPRESSION: Patient arrived to session noting no change in symptoms overall, though had increased pain during step ups with Lt LE during session. Patient tolerated all activities, though continues to have large deficits with static balance and LE strength (Lt > Rt). Patient will  continue to benefit from skilled PT.   OBJECTIVE IMPAIRMENTS: Abnormal gait, decreased activity tolerance, decreased balance, decreased coordination, decreased endurance, decreased mobility, difficulty walking, decreased ROM, decreased strength, improper body mechanics, postural dysfunction, and pain.   ACTIVITY LIMITATIONS: carrying, lifting, and stairs  PARTICIPATION LIMITATIONS: meal prep, cleaning, laundry, community activity, and caretaker duties  PERSONAL FACTORS: Time since onset of injury/illness/exacerbation and 3+ comorbidities: GERD, HTN, ulcerative colitis, Turner's syndrome, osteopenia, dyslipidemia are also affecting patient's functional outcome.   REHAB POTENTIAL: Good  CLINICAL DECISION MAKING: Evolving/moderate complexity  EVALUATION COMPLEXITY: Moderate   GOALS: Goals reviewed with patient? Yes  SHORT TERM GOALS: Target date: 01/28/2024  Patient will show compliance with initial HEP. Baseline: Goal status: Met 01/23/2024  2.  Patient will report pain levels no greater than 5/10 to show improved overall quality of life. Baseline:  Goal status: INITIAL   LONG TERM GOALS: Target date: 03/03/2024  Patient will show independence with final HEP in order to maintain and progress upon functional gains made within PT. Baseline:  Goal status: INITIAL  2.  Patient will report pain levels no greater than 3/10 to show improved overall quality of life. Baseline:  Goal status: INITIAL  3.  Patient will increase PSFS to at least 4 to show significant improvement in subjective disability rating. Baseline:  Goal status: INITIAL  4.  Patient will increase hip abduction MMT  to at least 4-/5 in order to show improved biomechanics with functional mobility and gait.  Baseline:  Goal status: INITIAL  5.  Patient will improve 4 stance balance in order to show increase in steadiness on feet.  Baseline:  Goal status: INITIAL  PLAN:  PT FREQUENCY: 1-2x/week  PT DURATION: 8  weeks  PLANNED INTERVENTIONS: 97164- PT Re-evaluation, 97750- Physical Performance Testing, 97110-Therapeutic exercises, 97530- Therapeutic activity, [id number]- Neuromuscular re-education, 97535- Self Care, [id number]- Manual therapy, [phone]- Gait training, [phone]- Electrical stimulation (unattended), [phone]- Electrical stimulation (manual), [id number]- Vasopneumatic device, [id number]- Ultrasound, [id number]- Traction (mechanical), [id number]- Ionotophoresis 4mg /ml Dexamethasone , [id number] (1-2 muscles), [id number] (3+ muscles)- Dry Needling, Patient/Family education, Balance training, Stair training, Taping, Joint mobilization, Joint manipulation, Spinal manipulation, Spinal mobilization, Scar mobilization, Vestibular training, DME instructions, Cryotherapy, and Moist heat.  PLAN FOR NEXT SESSION: Emphasis on reducing the lumbar disc (extension), hip abductors, erector spinae and quadratus lumborum strength work to improve endurance with standing and walking.  Static balance, in depth gait assessment, dynamic balance assessment, hip and LE strengthening, reprint HEP to include rhomberg stance with head nods instead of with eyes closed (patient not currently ready to perform eyes closed at home)   Susannah Daring, PT, DPT 01/31/24 3:37 PM

## 2024-01-31 ENCOUNTER — Ambulatory Visit

## 2024-01-31 DIAGNOSIS — R2681 Unsteadiness on feet: Secondary | ICD-10-CM

## 2024-01-31 DIAGNOSIS — M5459 Other low back pain: Secondary | ICD-10-CM

## 2024-01-31 DIAGNOSIS — M25652 Stiffness of left hip, not elsewhere classified: Secondary | ICD-10-CM

## 2024-01-31 DIAGNOSIS — M25552 Pain in left hip: Secondary | ICD-10-CM

## 2024-01-31 DIAGNOSIS — M6281 Muscle weakness (generalized): Secondary | ICD-10-CM

## 2024-01-31 DIAGNOSIS — R2689 Other abnormalities of gait and mobility: Secondary | ICD-10-CM | POA: Diagnosis not present

## 2024-02-06 ENCOUNTER — Ambulatory Visit: Admitting: Orthopedic Surgery

## 2024-02-07 NOTE — Therapy (Signed)
 OUTPATIENT PHYSICAL THERAPY THORACOLUMBAR TREATMENT   Patient Name: [name] MRN: [id number] DOB:[birth year], 68 y.o., female Today's Date: 02/10/2024  END OF SESSION:  PT End of Session - 02/10/24 1147     Visit Number 4    Number of Visits 16    Date for Recertification  03/03/24    Authorization Type BC[employer] MEDICARE $20 COPAY    Progress Note Due on Visit 10    PT Start Time 1147    PT Stop Time 1227    PT Time Calculation (min) 40 min    Activity Tolerance Patient tolerated treatment well;No increased pain    Behavior During Therapy [facility] for tasks assessed/performed             Past Medical History:  Diagnosis Date   B12 DEFICIENCY 02/09/2009   Qualifier: Diagnosis of  By: Joshua RN, CGRN, Sheri     Blood in stool    COLONIC POLYPS, HYPERPLASTIC, HX OF 02/03/2009   Qualifier: Diagnosis of  By: Surface RN, Arland MENDS, COLON 02/03/2009   Qualifier: Diagnosis of  By: Surface RN, Donna     Dyslipidemia 06/20/2012   Fracture, intertrochanteric, left femur (HCC) 05/02/2012   GERD (gastroesophageal reflux disease)    Hearing loss    Bil/has hearing aids   Hemorrhoids    HEMORRHOIDS 02/03/2009   Qualifier: Diagnosis of  By: Surface RN, Arland     Hypertension    INFLAMMATORY BOWEL DISEASE - Followed by Dr. Jakie in GI 02/03/2009   Qualifier: Diagnosis of  By: Surface RN, Arland Bristle    Turner's syndrome    was on Provera and Premarin and d/c this  at age 13yr   Ulcerative colitis    Past Surgical History:  Procedure Laterality Date   COLONOSCOPY  last 03/25/2013   HERNIA REPAIR  2009   lt ing    INCISION AND DRAINAGE  2004    lt thumb dog bite   INTRAMEDULLARY (IM) NAIL INTERTROCHANTERIC  05/02/2012   Procedure: INTRAMEDULLARY (IM) NAIL INTERTROCHANTRIC;  Surgeon: Fonda SHAUNNA Olmsted, MD;  Location: [facility] OR;  Service: Orthopedics;  Laterality: Left;   ORIF FINGER FRACTURE  06/14/2011   Procedure: OPEN REDUCTION INTERNAL FIXATION (ORIF) METACARPAL  (FINGER) FRACTURE;  Surgeon: Franky JONELLE Curia, MD;  Location: [facility];  Service: Orthopedics;  Laterality: Right;  right ring   POLYPECTOMY     TONSILLECTOMY     UPPER GASTROINTESTINAL ENDOSCOPY     Patient Active Problem List   Diagnosis Date Noted   Hyponatremia 10/19/2023   Thrombocytopenia (HCC) 10/19/2023   Acute leg pain, left 07/19/2022   Osteopenia/?Osteoporosis - followed by her gynecologist, Dr. Kandyce 06/20/2012   Dyslipidemia 06/20/2012   H/O Turner syndrome 05/02/2012   Essential hypertension 02/03/2009   INFLAMMATORY BOWEL DISEASE - Followed by GI 02/03/2009   History of colonic polyps 02/03/2009    PCP: Heron CHRISTELLA Sharper, MD   REFERRING PROVIDER: [name], MD  REFERRING DIAG: M54.50 (ICD-10-CM) - Lumbar pain  Rationale for Evaluation and Treatment: Rehabilitation  THERAPY DIAG:  Other low back pain  Muscle weakness (generalized)  Other abnormalities of gait and mobility  Unsteadiness on feet  Pain in left hip  Stiffness of left hip, not elsewhere classified  ONSET DATE: 4 years ago when it started; 2 years it has gotten worse   SUBJECTIVE:  SUBJECTIVE STATEMENT: Patient arriving noting no compliance with HEP while away on beach trip and requiring help for walking on the beach. Pain rated 4/10 this morning.   PERTINENT HISTORY:  Patient states she experienced a femur fracture in 2013 from a fall at work, but has not had any other injuries or surgeries to the area. Patient received a cortisone shot for Left hip pain then went to ER 3 days as she started having slurred speech, balance issues, and ambulation deficits. After patient was released, she went to Dr. Burnetta and received a prednisone  taper pack which assisted her pain, but she had  withdrawal-like symptoms after completing. Patient also noted she is having platelet issues and is seeing a hematologist.  PAIN:  Are you having pain? Yes: NPRS scale: 0/10 at rest, 7-8/10 at worst  Pain location: left lower back and down left LE  Pain description: burning Aggravating factors: walking Relieving factors: sitting/resting  PRECAUTIONS: None  RED FLAGS: Noting some bladder incontinence, but being followed by OB/GYN for that    WEIGHT BEARING RESTRICTIONS: No  FALLS:  Has patient fallen in last 6 months? 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  DIAGNOSTIC FINDINGS:  XRAY: showing disc at loss at L4/5 and L5/S1.  No evidence of  instability on flexion/extension views.  No fracture or dislocation seen.   MRI IMPRESSION: 1. Right eccentric disc bulge and facet arthropathy at L4-L5 resulting in moderate right neural foraminal narrowing. 2. Disc bulge at L5-S1 with lateral disc contacting the exited left L5 nerve root in the extraforaminal zone.  PATIENT SURVEYS:  PSFS: THE PATIENT SPECIFIC FUNCTIONAL SCALE  Place score of 0-10 (0 = unable to perform activity and 10 = able to perform activity at the same level as before injury or problem)  Activity Date: 01/07/2024 02/10/2024   Balance   1 5   2. Walking   3 6   3.     4.      Total Score 2 5.5     Total Score = Sum of activity scores/number of activities  Minimally Detectable Change: 3 points (for single activity); 2 points (for average  score)  [facility] (nd). The Patient Specific Functional Scale . Retrieved from [email or web address]   COGNITION: Overall cognitive status: Within functional limits for tasks assessed     SENSATION: Light touch: WFL  MUSCLE LENGTH: Not assessed on eval  POSTURE: rounded shoulders, forward head, and increased thoracic kyphosis  PALPATION: TTP 2/10 at Left upper/lateral glute  LUMBAR ROM:   AROM Eval 01/07/2024 02/10/2024  Flexion WFL   Extension 50%   Right lateral flexion 50-75%   Left lateral flexion 50-75%   Right rotation 50%   Left rotation 50%     (Blank rows = not tested)  LOWER EXTREMITY ROM:     ROM Right Eval 01/07/2024 Left Eval 01/07/2024  Hip flexion [facility]   Hip extension    Hip abduction    Hip adduction    Hip internal rotation    Hip external rotation  Knee flexion    Knee extension    Ankle dorsiflexion    Ankle plantarflexion    Ankle inversion    Ankle eversion     (Blank rows = not tested)  LOWER EXTREMITY MMT:    MMT Right Eval 01/07/2024 Left Eval 01/07/2024  Hip flexion (seated) 5/5 4-/5  Hip extension (prone) 3-/5 3-/5  Hip abduction (sidelying) 3-/5 3-/5  Hip adduction    Hip internal rotation    Hip external rotation    Knee flexion (seated) 5/5 4-/5  Knee extension (seated) 5/5 4/5  Ankle dorsiflexion    Ankle plantarflexion    Ankle inversion    Ankle eversion     (Blank rows = not tested)  LUMBAR SPECIAL TESTS:  Straight leg raise test: Negative, Slump test: Negative, and FABER test: Positive on L, but due to past hip fracture Clonus: negative   FUNCTIONAL TESTS:  5 times sit to stand: 10.27s  performed with SBA 4 stance balance: normal and narrow 10s, unable to complete tandem, 4s Rt 5s Lt single leg with lateral LOB and appropriate stepping reactions ; performed with CGA   GAIT: Distance walked: not formally assessed  Assistive device utilized:  None Level of assistance: SBA Comments: increased IR throughout gait with Left LE, antalgic gait pattern, Left lateral lean with Left stance  TREATMENT DATE:  02/10/2024 TherAct: Standing hip hikes in parallel bars using mirror for visual feedback 2x10 each leg   Step ups using 2 step 2x10 each LE, single UE only for balance support  Reciprocal tap downs using 2 step 2x20, single UE only for balance support  Lateral step ups with 2 step 1x8, 1x10 each side   Neuro Re-Ed:  Grape vine within parallel bars and UE use for balance support  Narrow stance with horizontal head turns with CGA 1x20 head turns  Narrow stance with vertical head turns with CGA 1x20 head turns  Step taps to 2 step with reciprocal pattern 1x12, 1x16 with CGA  Single leg stance with slider under contralateral LE performing lateral slides 2x8 each LE with bilat UE use for balance support   01/30/2024 TherAct:  Standing hip hikes in parallel bars using mirror for visual feedback for lateral lean 2x10 each leg  Step ups using 4 step 1x10 with Rt leg, 1x6 with Lt leg with patient noting difficulty/pain with LT  Switched to 2 step to finish Lt leg  PT added to HEP with use of handrail for larger steps and to perform only if pain free  Tap downs from 2 step 1x10 each leg   Neuro Re-Ed:  Modified tandem stance in parallel bars with CGA 2x30s each leg  Patient requiring intermittent UE use on parallel bars due to lateral LOB  Narrow stance with vertical head turns in parallel bars with CGA 1x20 head turns Patient requiring intermittent UE use on parallel bars due to lateral LOB  Narrow stance with horizontal head turns in parallel bars with CGA 1x20 head turns  Patient requiring intermittent UE use on parallel bars due to lateral LOB  Taps to cone in parallel bars with CGA 1x10 with high difficulty  Patient requiring intermittent UE use on parallel bars due to lateral LOB  Taps to step in parallel bars with SBA  1x10 with decreased difficulty  Patient requiring intermittent UE use on parallel bars due to lateral LOB  Discussed 3 balance systems (proprioception, vision, vestibular) and balance strategies (ankle, hip, step) PT added corner narrow stance  balance to HEP  01/17/2024 Lumbar extension AROM 10 x 3 seconds Standing hip extensions 10 x 3 seconds Standing alternating hip abduction 2 sets of 10 for 3 seconds  Functional Activities: Hip hike in door frame 2 sets of 5 for 3 seconds Prone hip extension (alternate) 10 x 3 seconds  [id number]: Reviewed spine imaging; spine anatomy with model; practical bed mobility and posture with house chores (dishes); lumbar roll use; disc pressure in various postures   01/07/2024 TherEx:  HEP handout provided with patient performing one set of each exercise for appropriate form. Verbal and tactile cues required.                                                                                                                                PATIENT EDUCATION:  Education details: HEP, POC  Person educated: Patient Education method: Explanation, Demonstration, Tactile cues, Verbal cues, and Handouts Education comprehension: verbalized understanding, returned demonstration, verbal cues required, and tactile cues required  HOME EXERCISE PROGRAM: Access Code: [email or web address] URL: [email or web address] Date: 01/31/2024 Prepared by: Susannah Daring  Exercises - Standing Hip Abduction with Counter Support  - 1 x daily - 7 x weekly - 2 sets - 10 reps - Standing Hip Extension with Counter Support  - 1 x daily - 7 x weekly - 2 sets - 10 reps - Seated Active Straight-Leg Raise  - 1 x daily - 7 x weekly - 2 sets - 10 reps - Heel Raises with Counter Support  - 1 x daily - 7 x weekly - 2 sets - 10 reps - Mini Squat with Counter Support  - 1 x daily - 7 x weekly - 2 sets - 10 reps - Standing Lumbar Extension at Wall - Forearms  - 5 x daily - 7 x weekly - 1 sets - 5  reps - 3 seconds hold - Standing Hip Hiking  - 5 x daily - 7 x weekly - 1 sets - 5 reps - 3 seconds hold - Prone Hip Extension  - 2 x daily - 7 x weekly - 1-2 sets - 10 reps - 3 seconds hold - Romberg Stance with Head Rotation  - 1 x daily - 7 x weekly - 2 sets - 10 reps - Romberg Stance with Eyes Closed  - 1 x daily - 7 x weekly - 2 sets - 10 reps - Step Up  - 1 x daily - 7 x weekly - 2 sets - 10 reps - Standing Forward Step Taps with Counter Support  - 1 x daily - 7 x weekly - 2 sets - 10 reps  ASSESSMENT:  CLINICAL IMPRESSION: Patient arrived to session noting consistent pain in Lt LE, though has improved since last session. Patient tolerated all activities this date with minimal increases in pain with lateral step ups. Patient is still highly anxious and fearful of falling. Patient will continue to benefit from skilled PT.   OBJECTIVE  IMPAIRMENTS: Abnormal gait, decreased activity tolerance, decreased balance, decreased coordination, decreased endurance, decreased mobility, difficulty walking, decreased ROM, decreased strength, improper body mechanics, postural dysfunction, and pain.   ACTIVITY LIMITATIONS: carrying, lifting, and stairs  PARTICIPATION LIMITATIONS: meal prep, cleaning, laundry, community activity, and caretaker duties  PERSONAL FACTORS: Time since onset of injury/illness/exacerbation and 3+ comorbidities: GERD, HTN, ulcerative colitis, Turner's syndrome, osteopenia, dyslipidemia are also affecting patient's functional outcome.   REHAB POTENTIAL: Good  CLINICAL DECISION MAKING: Evolving/moderate complexity  EVALUATION COMPLEXITY: Moderate   GOALS: Goals reviewed with patient? Yes  SHORT TERM GOALS: Target date: 01/28/2024  Patient will show compliance with initial HEP. Baseline: Goal status: Met 01/23/2024  2.  Patient will report pain levels no greater than 5/10 to show improved overall quality of life. Baseline:  Goal status: ongoing, 02/10/2024   LONG TERM  GOALS: Target date: 03/03/2024  Patient will show independence with final HEP in order to maintain and progress upon functional gains made within PT. Baseline:  Goal status: INITIAL  2.  Patient will report pain levels no greater than 3/10 to show improved overall quality of life. Baseline:  Goal status: INITIAL  3.  Patient will increase PSFS to at least 4 to show significant improvement in subjective disability rating. Baseline:  Goal status: INITIAL  4.  Patient will increase hip abduction MMT to at least 4-/5 in order to show improved biomechanics with functional mobility and gait.  Baseline:  Goal status: INITIAL  5.  Patient will improve 4 stance balance in order to show increase in steadiness on feet.  Baseline:  Goal status: INITIAL  PLAN:  PT FREQUENCY: 1-2x/week  PT DURATION: 8 weeks  PLANNED INTERVENTIONS: [id number]- PT Re-evaluation, 97750- Physical Performance Testing, 97110-Therapeutic exercises, 97530- Therapeutic activity, [id number]- Neuromuscular re-education, 97535- Self Care, [id number]- Manual therapy, [phone]- Gait training, [phone]- Electrical stimulation (unattended), [phone]- Electrical stimulation (manual), [id number]- Vasopneumatic device, [id number]- Ultrasound, [id number]- Traction (mechanical), [id number]- Ionotophoresis 4mg /ml Dexamethasone , [id number] (1-2 muscles), [id number] (3+ muscles)- Dry Needling, Patient/Family education, Balance training, Stair training, Taping, Joint mobilization, Joint manipulation, Spinal manipulation, Spinal mobilization, Scar mobilization, Vestibular training, DME instructions, Cryotherapy, and Moist heat.  PLAN FOR NEXT SESSION: Emphasis on reducing the lumbar disc (extension), hip abductors, erector spinae and quadratus lumborum strength work to improve endurance with standing and walking.  Static balance, in depth gait assessment, dynamic balance assessment, hip and LE strengthening, reprint HEP to include rhomberg stance with head nods instead of with eyes closed  (patient not currently ready to perform eyes closed at home)   Susannah Daring, PT, DPT 02/10/24 12:50 PM

## 2024-02-10 ENCOUNTER — Encounter: Payer: Self-pay | Admitting: Gastroenterology

## 2024-02-10 ENCOUNTER — Ambulatory Visit

## 2024-02-10 DIAGNOSIS — M6281 Muscle weakness (generalized): Secondary | ICD-10-CM | POA: Diagnosis not present

## 2024-02-10 DIAGNOSIS — M5459 Other low back pain: Secondary | ICD-10-CM | POA: Diagnosis not present

## 2024-02-10 DIAGNOSIS — R2689 Other abnormalities of gait and mobility: Secondary | ICD-10-CM

## 2024-02-10 DIAGNOSIS — M25552 Pain in left hip: Secondary | ICD-10-CM

## 2024-02-10 DIAGNOSIS — R2681 Unsteadiness on feet: Secondary | ICD-10-CM

## 2024-02-10 DIAGNOSIS — M25652 Stiffness of left hip, not elsewhere classified: Secondary | ICD-10-CM

## 2024-02-11 ENCOUNTER — Ambulatory Visit: Admitting: Gastroenterology

## 2024-02-16 ENCOUNTER — Emergency Department (HOSPITAL_COMMUNITY)

## 2024-02-16 ENCOUNTER — Encounter (HOSPITAL_COMMUNITY): Payer: Self-pay

## 2024-02-16 ENCOUNTER — Other Ambulatory Visit: Payer: Self-pay

## 2024-02-16 ENCOUNTER — Emergency Department (HOSPITAL_COMMUNITY): Admission: EM | Admit: 2024-02-16 | Discharge: 2024-02-16 | Disposition: A | Discharge status: Home / Self Care

## 2024-02-16 DIAGNOSIS — W19XXXA Unspecified fall, initial encounter: Secondary | ICD-10-CM | POA: Diagnosis not present

## 2024-02-16 DIAGNOSIS — M25552 Pain in left hip: Secondary | ICD-10-CM | POA: Diagnosis not present

## 2024-02-16 DIAGNOSIS — Z862 Personal history of diseases of the blood and blood-forming organs and certain disorders involving the immune mechanism: Secondary | ICD-10-CM | POA: Insufficient documentation

## 2024-02-16 DIAGNOSIS — M25559 Pain in unspecified hip: Secondary | ICD-10-CM | POA: Diagnosis not present

## 2024-02-16 DIAGNOSIS — Z043 Encounter for examination and observation following other accident: Secondary | ICD-10-CM | POA: Diagnosis not present

## 2024-02-16 DIAGNOSIS — S0990XA Unspecified injury of head, initial encounter: Secondary | ICD-10-CM | POA: Diagnosis not present

## 2024-02-16 DIAGNOSIS — W01198A Fall on same level from slipping, tripping and stumbling with subsequent striking against other object, initial encounter: Secondary | ICD-10-CM | POA: Insufficient documentation

## 2024-02-16 DIAGNOSIS — Y9269 Other specified industrial and construction area as the place of occurrence of the external cause: Secondary | ICD-10-CM | POA: Diagnosis not present

## 2024-02-16 DIAGNOSIS — S7002XA Contusion of left hip, initial encounter: Secondary | ICD-10-CM | POA: Insufficient documentation

## 2024-02-16 DIAGNOSIS — S0003XA Contusion of scalp, initial encounter: Secondary | ICD-10-CM | POA: Diagnosis not present

## 2024-02-16 DIAGNOSIS — I6782 Cerebral ischemia: Secondary | ICD-10-CM | POA: Diagnosis not present

## 2024-02-16 LAB — BASIC METABOLIC PANEL WITH GFR
Anion gap: 10 (ref 5–15)
BUN: 21 mg/dL (ref 8–23)
CO2: 24 mmol/L (ref 22–32)
Calcium: 9 mg/dL (ref 8.9–10.3)
Chloride: 106 mmol/L (ref 98–111)
Creatinine, Ser: 1.06 mg/dL — ABNORMAL HIGH (ref 0.44–1.00)
GFR, Estimated: 58 mL/min — ABNORMAL LOW (ref >60)
Glucose, Bld: 108 mg/dL — ABNORMAL HIGH (ref 70–99)
Potassium: 3.9 mmol/L (ref 3.5–5.1)
Sodium: 140 mmol/L (ref 135–145)

## 2024-02-16 LAB — CBC
HCT: 38.9 % (ref 36.0–46.0)
Hemoglobin: 12.1 g/dL (ref 12.0–15.0)
MCH: 26.9 pg (ref 26.0–34.0)
MCHC: 31.1 g/dL (ref 30.0–36.0)
MCV: 86.6 fL (ref 80.0–100.0)
Platelets: 63 K/uL — ABNORMAL LOW (ref 150–400)
RBC: 4.49 MIL/uL (ref 3.87–5.11)
RDW: 17.1 % — ABNORMAL HIGH (ref 11.5–15.5)
WBC: 15.8 K/uL — ABNORMAL HIGH (ref 4.0–10.5)
nRBC: 0 % (ref 0.0–0.2)

## 2024-02-16 NOTE — ED Notes (Signed)
 NT ambulated pt pt needed to hold on to NT to walk, pt used a walker to ambulate and did fine

## 2024-02-16 NOTE — Discharge Instructions (Signed)
 Your workup today was reassuring.  Please take Tylenol as needed for pain and follow-up with your doctor.  Return to the ER for worsening symptoms.

## 2024-02-16 NOTE — ED Triage Notes (Signed)
 Pt bib ems, pt was at the [facility], tripped as she forgot a step and fell.   Left hip deformity with mild shortening. Hx of prior surgery. Left sided head hematoma. 100mcg fent given PTA

## 2024-02-16 NOTE — ED Provider Notes (Signed)
 [place] EMERGENCY DEPARTMENT AT [facility] Provider Note   CSN: [id number] Arrival date & time: 02/16/24  1640     Patient presents with: [name] is a 68 y.o. female.   68 year old female with past medical history of ITP and osteoporosis presenting to the emergency department today with left hip pain.  The [place] [facility].  She lost her footing.  She is having left hip pain since then.  She did hit her head but did not lose consciousness.  She is not on any blood thinners but does have a history of ITP.  She denies any significant headache.  She denies any weakness.  She came to the ER for further evaluation regarding this.   Fall       Prior to Admission medications   Medication Sig Start Date End Date Taking? Authorizing Provider  alendronate  (FOSAMAX ) 70 MG tablet Take 70 mg by mouth once a week. 08/21/21   [provider]  Calcium  Polycarbophil (FIBER-CAPS PO) Take 3 tablets by mouth daily at 6 (six) AM.    [provider]  celecoxib  (CELEBREX ) 100 MG capsule Take 100 mg by mouth as needed for mild pain (pain score 1-3) or moderate pain (pain score 4-6). 10/03/23   [provider]  Docusate Sodium  (COLACE PO) Take 3 capsules by mouth daily at 6 (six) AM.    [provider]  lisinopril  (ZESTRIL ) 40 MG tablet TAKE 1 TABLET BY MOUTH EVERY DAY 08/12/23   Ozell Heron HERO, MD  metoprolol  succinate (TOPROL -XL) 25 MG 24 hr tablet Take 1 tablet (25 mg total) by mouth daily. 11/05/23   Ozell Heron HERO, MD  ondansetron  (ZOFRAN -ODT) 4 MG disintegrating tablet Take 1 tablet (4 mg total) by mouth every 8 (eight) hours as needed for nausea or vomiting. Patient not taking: Reported on 12/30/2023 11/29/23   Ozell Heron HERO, MD  pregabalin  (LYRICA ) 75 MG capsule Take 1 capsule (75 mg total) by mouth 2 (two) times daily. 12/19/23 02/17/24  Georgina Ozell LABOR, MD  rosuvastatin  (CRESTOR ) 20 MG  tablet TAKE 1 TABLET BY MOUTH DAILY. GENERIC EQUIVALENT FOR CRESTOR . 11/05/23   Ozell Heron HERO, MD  traZODone  (DESYREL ) 50 MG tablet Take 0.5-1 tablets (25-50 mg total) by mouth at bedtime as needed for sleep. 12/16/23   Ozell Heron HERO, MD  VITAMIN D  PO Take 2,000 Units by mouth daily.    [provider]    Allergies: Prednisone     Review of Systems  Musculoskeletal:  Positive for arthralgias.  All other systems reviewed and are negative.   Updated Vital Signs BP 106/85   Pulse 66   Temp 97.8 F (36.6 C) (Oral)   Resp 15   Ht 4' 9 (1.448 m)   Wt 70.3 kg   SpO2 96%   BMI 33.54 kg/m   Physical Exam Vitals and nursing note reviewed.   Gen: NAD Eyes: PERRL, EOMI HEENT: no oropharyngeal swelling Neck: trachea midline, no cervical spine tenderness, no stepoffs or deformities Resp: clear to auscultation bilaterally Card: RRR, no murmurs, rubs, or gallops Abd: nontender, nondistended, no seatbelt sign Extremities: no calf tenderness, no edema, the patient is tender over the proximal left femur with normal range of motion actively and passively, no gross deformity noted MSK: no thoracic spinal tenderness, no lumbar spinal tenderness, no step-offs or deformities Vascular: 2+ radial pulses bilaterally, 2+ DP pulses bilaterally Neuro: Alert and oriented x 3, equal  strength sensation throughout bilateral upper and lower extremities Skin: no rashes   (all labs ordered are listed, but only abnormal results are displayed) Labs Reviewed  CBC - Abnormal; Notable for the following components:      Result Value   WBC 15.8 (*)    RDW 17.1 (*)    Platelets 63 (*)    All other components within normal limits  BASIC METABOLIC PANEL WITH GFR - Abnormal; Notable for the following components:   Glucose, Bld 108 (*)    Creatinine, Ser 1.06 (*)    GFR, Estimated 58 (*)    All other components within normal limits    EKG: None  Radiology: CT PELVIS WO CONTRAST Result  Date: 02/16/2024 CLINICAL DATA:  Pelvic fracture L pelvis and L hip pain, XR negative. Fall. EXAM: CT PELVIS WITHOUT CONTRAST TECHNIQUE: Multidetector CT imaging of the pelvis was performed following the standard protocol without intravenous contrast. RADIATION DOSE REDUCTION: This exam was performed according to the departmental dose-optimization program which includes automated exposure control, adjustment of the mA and/or kV according to patient size and/or use of iterative reconstruction technique. COMPARISON:  Plain films today.  CT and plain films 10/19/2023 FINDINGS: Urinary Tract:  No abnormality visualized. Bowel:  Unremarkable visualized pelvic bowel loops. Vascular/Lymphatic: Aortic atherosclerosis. No evidence of aneurysm or adenopathy. Reproductive:  Uterus and adnexa unremarkable.  No mass. Other:  No free fluid or free air. Musculoskeletal: Hardware in the left proximal femur related to remote fracture. No acute fracture, subluxation or dislocation. IMPRESSION: Remote posttraumatic and postoperative changes in the left proximal femur. No acute bony abnormality. Electronically Signed   By: Franky Crease M.D.   On: 02/16/2024 19:40   CT Head Wo Contrast Result Date: 02/16/2024 CLINICAL DATA:  Status post trauma. EXAM: CT HEAD WITHOUT CONTRAST TECHNIQUE: Contiguous axial images were obtained from the base of the skull through the vertex without intravenous contrast. RADIATION DOSE REDUCTION: This exam was performed according to the departmental dose-optimization program which includes automated exposure control, adjustment of the mA and/or kV according to patient size and/or use of iterative reconstruction technique. COMPARISON:  Oct 19, 2023 FINDINGS: Brain: There is generalized cerebral atrophy with widening of the extra-axial spaces and ventricular dilatation. There are areas of decreased attenuation within the white matter tracts of the supratentorial brain, consistent with microvascular disease  changes. Vascular: No hyperdense vessel or unexpected calcification. Skull: Normal. Negative for fracture or focal lesion. Sinuses/Orbits: No acute finding. Other: There is mild to moderate severity left frontoparietal scalp soft tissue swelling. An associated 1.1 cm x 0.8 cm x 1.8 cm left frontal scalp hematoma is noted. IMPRESSION: 1. Generalized cerebral atrophy with chronic white matter small vessel ischemic changes. 2. Mild to moderate severity left frontoparietal scalp soft tissue swelling with an associated scalp hematoma. 3. No acute intracranial abnormality. Electronically Signed   By: Suzen Dials M.D.   On: 02/16/2024 17:56   DG Hip Unilat W or Wo Pelvis 2-3 Views Left Result Date: 02/16/2024 CLINICAL DATA:  Fall EXAM: DG HIP (WITH OR WITHOUT PELVIS) 2-3V LEFT COMPARISON:  10/19/2023 FINDINGS: SI joints are non widened. Pubic symphysis and rami appear intact. Mild to moderate degenerative changes of the right hip. Redemonstrated intramedullary rod in left femur with similar appearance of the visible portions of the hardware. Chronic fracture deformity of the left proximal femur with heterotopic ossification. No definite acute displaced fracture is seen IMPRESSION: 1. No definite acute osseous abnormality. 2. Chronic fracture deformity of the  left proximal femur with intramedullary rod in place. Electronically Signed   By: Luke Bun M.D.   On: 02/16/2024 17:28     Procedures   Medications Ordered in the ED - No data to display                                  Medical Decision Making 68 year old female with past medical history of ITP and osteoporosis presenting to the emergency department today with left hip pain after a mechanical fall earlier today.  I will further evaluate the patient here with basic labs to evaluate her platelet count as well as a CT scan of her head to eval for intracranial hemorrhage.  Will also obtain an x-ray of the left hip for further evaluation for  fracture or dislocation.  The patient is otherwise hemodynamically stable.  Will reevaluate for ultimate disposition.  The patient's labs are relatively stable.  CT scan of her head is negative.  Her hip x-ray was negative.  She is still having some discomfort and needing to use a walker to get around which she normally does not use at baseline and normally.  She does have a walker at home that she had after surgery.  CT scan is ordered and does not show any acute fractures.  I think that she is stable for discharge and was able to ambulate well with the walker.  She is encouraged to use this until her symptoms improved but I think that she is stable for discharge.  Amount and/or Complexity of Data Reviewed Labs: ordered. Radiology: ordered.        Final diagnoses:  Contusion of left hip, initial encounter  Closed head injury, initial encounter    ED Discharge Orders     None          Ula Prentice SAUNDERS, MD 02/16/24 [phone]

## 2024-02-17 NOTE — Therapy (Signed)
 OUTPATIENT PHYSICAL THERAPY THORACOLUMBAR TREATMENT   Patient Name: [name] MRN: [id number] DOB:11/30/1955, 68 y.o., female Today's Date: 02/18/2024  END OF SESSION:  PT End of Session - 02/18/24 1429     Visit Number 5    Number of Visits 16    Date for Recertification  03/03/24    Authorization Type [employer] MEDICARE $20 COPAY    Progress Note Due on Visit 10    PT Start Time 1434    PT Stop Time 1515    PT Time Calculation (min) 41 min    Activity Tolerance Patient tolerated treatment well;No increased pain    Behavior During Therapy [facility] for tasks assessed/performed              Past Medical History:  Diagnosis Date   B12 DEFICIENCY 02/09/2009   Qualifier: Diagnosis of  By: Joshua RN, CGRN, Sheri     Blood in stool    COLONIC POLYPS, HYPERPLASTIC, HX OF 02/03/2009   Qualifier: Diagnosis of  By: Surface RN, Arland MENDS, COLON 02/03/2009   Qualifier: Diagnosis of  By: Surface RN, Donna     Dyslipidemia 06/20/2012   Fracture, intertrochanteric, left femur (HCC) 05/02/2012   GERD (gastroesophageal reflux disease)    Hearing loss    Bil/has hearing aids   Hemorrhoids    HEMORRHOIDS 02/03/2009   Qualifier: Diagnosis of  By: Surface RN, Arland     Hypertension    INFLAMMATORY BOWEL DISEASE - Followed by Dr. Jakie in GI 02/03/2009   Qualifier: Diagnosis of  By: Surface RN, Arland Bristle    Turner's syndrome    was on Provera and Premarin and d/c this  at age 67yr   Ulcerative colitis    Past Surgical History:  Procedure Laterality Date   COLONOSCOPY  last 03/25/2013   HERNIA REPAIR  2009   lt ing    INCISION AND DRAINAGE  2004    lt thumb dog bite   INTRAMEDULLARY (IM) NAIL INTERTROCHANTERIC  05/02/2012   Procedure: INTRAMEDULLARY (IM) NAIL INTERTROCHANTRIC;  Surgeon: Fonda SHAUNNA Olmsted, MD;  Location: [facility] OR;  Service: Orthopedics;  Laterality: Left;   ORIF FINGER FRACTURE  06/14/2011   Procedure: OPEN REDUCTION INTERNAL FIXATION (ORIF)  METACARPAL (FINGER) FRACTURE;  Surgeon: Franky JONELLE Curia, MD;  Location: [facility];  Service: Orthopedics;  Laterality: Right;  right ring   POLYPECTOMY     TONSILLECTOMY     UPPER GASTROINTESTINAL ENDOSCOPY     Patient Active Problem List   Diagnosis Date Noted   Hyponatremia 10/19/2023   Thrombocytopenia 10/19/2023   Acute leg pain, left 07/19/2022   Osteopenia/?Osteoporosis - followed by her gynecologist, Dr. Kandyce 06/20/2012   Dyslipidemia 06/20/2012   H/O Turner syndrome 05/02/2012   Essential hypertension 02/03/2009   INFLAMMATORY BOWEL DISEASE - Followed by GI 02/03/2009   History of colonic polyps 02/03/2009    PCP: Heron CHRISTELLA Sharper, MD   REFERRING PROVIDER: [name], MD  REFERRING DIAG: M54.50 (ICD-10-CM) - Lumbar pain  Rationale for Evaluation and Treatment: Rehabilitation  THERAPY DIAG:  Other low back pain  Muscle weakness (generalized)  Other abnormalities of gait and mobility  Unsteadiness on feet  Pain in left hip  Stiffness of left hip, not elsewhere classified  ONSET DATE: 4 years ago when it started; 2 years it has gotten worse   SUBJECTIVE:  SUBJECTIVE STATEMENT: Patient reports having fallen in the community on Sunday where she landed on her Lt side. She has been cleared by MD for any fractures or head injuries. General soreness rated at 2/10 with activity.   PERTINENT HISTORY:  Patient states she experienced a femur fracture in 2013 from a fall at work, but has not had any other injuries or surgeries to the area. Patient received a cortisone shot for Left hip pain then went to ER 3 days as she started having slurred speech, balance issues, and ambulation deficits. After patient was released, she went to Dr. Burnetta and received a prednisone   taper pack which assisted her pain, but she had withdrawal-like symptoms after completing. Patient also noted she is having platelet issues and is seeing a hematologist.  PAIN:  Are you having pain? Yes: NPRS scale: 0/10 at rest, 7-8/10 at worst  Pain location: left lower back and down left LE  Pain description: burning Aggravating factors: walking Relieving factors: sitting/resting  PRECAUTIONS: None  RED FLAGS: Noting some bladder incontinence, but being followed by OB/GYN for that    WEIGHT BEARING RESTRICTIONS: No  FALLS:  Has patient fallen in last 6 months? 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  DIAGNOSTIC FINDINGS:  XRAY: showing disc at loss at L4/5 and L5/S1.  No evidence of  instability on flexion/extension views.  No fracture or dislocation seen.   MRI IMPRESSION: 1. Right eccentric disc bulge and facet arthropathy at L4-L5 resulting in moderate right neural foraminal narrowing. 2. Disc bulge at L5-S1 with lateral disc contacting the exited left L5 nerve root in the extraforaminal zone.  PATIENT SURVEYS:  PSFS: THE PATIENT SPECIFIC FUNCTIONAL SCALE  Place score of 0-10 (0 = unable to perform activity and 10 = able to perform activity at the same level as before injury or problem)  Activity Date: 01/07/2024 02/10/2024   Balance   1 5   2. Walking   3 6   3.     4.      Total Score 2 5.5     Total Score = Sum of activity scores/number of activities  Minimally Detectable Change: 3  points (for single activity); 2 points (for average score)  [facility] (nd). The Patient Specific Functional Scale . Retrieved from [email or web address]   COGNITION: Overall cognitive status: Within functional limits for tasks assessed     SENSATION: Light touch: WFL  MUSCLE LENGTH: Not assessed on eval  POSTURE: rounded shoulders, forward head, and increased thoracic kyphosis  PALPATION: TTP 2/10 at Left upper/lateral glute  LUMBAR ROM:   AROM Eval 01/07/2024 02/10/2024  Flexion WFL   Extension 50%   Right lateral flexion 50-75%   Left lateral flexion 50-75%   Right rotation 50%   Left rotation 50%     (Blank rows = not tested)  LOWER EXTREMITY ROM:     ROM Right Eval 01/07/2024 Left Eval 01/07/2024  Hip flexion [facility]   Hip extension    Hip abduction    Hip adduction    Hip  internal rotation    Hip external rotation    Knee flexion    Knee extension    Ankle dorsiflexion    Ankle plantarflexion    Ankle inversion    Ankle eversion     (Blank rows = not tested)  LOWER EXTREMITY MMT:    MMT Right Eval 01/07/2024 Left Eval 01/07/2024  Hip flexion (seated) 5/5 4-/5  Hip extension (prone) 3-/5 3-/5  Hip abduction (sidelying) 3-/5 3-/5  Hip adduction    Hip internal rotation    Hip external rotation    Knee flexion (seated) 5/5 4-/5  Knee extension (seated) 5/5 4/5  Ankle dorsiflexion    Ankle plantarflexion    Ankle inversion    Ankle eversion     (Blank rows = not tested)  LUMBAR SPECIAL TESTS:  Straight leg raise test: Negative, Slump test: Negative, and FABER test: Positive on L, but due to past hip fracture Clonus: negative   FUNCTIONAL TESTS:  5 times sit to stand: 10.27s  performed with SBA 4 stance balance: normal and narrow 10s, unable to complete tandem, 4s Rt 5s Lt single leg with lateral LOB and appropriate stepping reactions ; performed with CGA   GAIT: Distance  walked: not formally assessed  Assistive device utilized: None Level of assistance: SBA Comments: increased IR throughout gait with Left LE, antalgic gait pattern, Left lateral lean with Left stance  TREATMENT DATE:  02/18/2024 TherEx:  Lateral stepping with UE support on parallel bars; 3x down and back  Mini squats with UE support in parallel bars 2x10  Nustep level 3 for 8 minutes with bilat UE and LE  Bridges 2x10 with 2s hold  Supine clamshells with red TB 2x12  Supine marches with TA activation 2x10  Calf raises into toe raises with bilat UE support 1x10; verbal cues required for appropriate form    02/10/2024 TherAct: Standing hip hikes in parallel bars using mirror for visual feedback 2x10 each leg   Step ups using 2 step 2x10 each LE, single UE only for balance support  Reciprocal tap downs using 2 step 2x20, single UE only for balance support  Lateral step ups with 2 step 1x8, 1x10 each side   Neuro Re-Ed:  Grape vine within parallel bars and UE use for balance support  Narrow stance with horizontal head turns with CGA 1x20 head turns  Narrow stance with vertical head turns with CGA 1x20 head turns  Step taps to 2 step with reciprocal pattern 1x12, 1x16 with CGA  Single leg stance with slider under contralateral LE performing lateral slides 2x8 each LE with bilat UE use for balance support   01/30/2024 TherAct:  Standing hip hikes in parallel bars using mirror for visual feedback for lateral lean 2x10 each leg  Step ups using 4 step 1x10 with Rt leg, 1x6 with Lt leg with patient noting difficulty/pain with LT  Switched to 2 step to finish Lt leg  PT added to HEP with use of handrail for larger steps and to perform only if pain free  Tap downs from 2 step 1x10 each leg   Neuro Re-Ed:  Modified tandem stance in parallel bars with CGA 2x30s each leg  Patient requiring intermittent UE use on parallel bars due to lateral LOB  Narrow stance with vertical head turns  in parallel bars with CGA 1x20 head turns Patient requiring intermittent UE use on parallel bars due to lateral LOB  Narrow stance with horizontal head turns in parallel bars with  CGA 1x20 head turns  Patient requiring intermittent UE use on parallel bars due to lateral LOB  Taps to cone in parallel bars with CGA 1x10 with high difficulty  Patient requiring intermittent UE use on parallel bars due to lateral LOB  Taps to step in parallel bars with SBA 1x10 with decreased difficulty  Patient requiring intermittent UE use on parallel bars due to lateral LOB  Discussed 3 balance systems (proprioception, vision, vestibular) and balance strategies (ankle, hip, step) PT added corner narrow stance balance to HEP  01/17/2024 Lumbar extension AROM 10 x 3 seconds Standing hip extensions 10 x 3 seconds Standing alternating hip abduction 2 sets of 10 for 3 seconds  Functional Activities: Hip hike in door frame 2 sets of 5 for 3 seconds Prone hip extension (alternate) 10 x 3 seconds  [id number]: Reviewed spine imaging; spine anatomy with model; practical bed mobility and posture with house chores (dishes); lumbar roll use; disc pressure in various postures                                                                                                                          PATIENT EDUCATION:  Education details: HEP, POC  Person educated: Patient Education method: Explanation, Demonstration, Tactile cues, Verbal cues, and Handouts Education comprehension: verbalized understanding, returned demonstration, verbal cues required, and tactile cues required  HOME EXERCISE PROGRAM: Access Code: [email or web address] URL: [email or web address] Date: 01/31/2024 Prepared by: Susannah Daring  Exercises - Standing Hip Abduction with Counter Support  - 1 x daily - 7 x weekly - 2 sets - 10 reps - Standing Hip Extension with Counter Support  - 1 x daily - 7 x weekly - 2 sets - 10 reps - Seated Active  Straight-Leg Raise  - 1 x daily - 7 x weekly - 2 sets - 10 reps - Heel Raises with Counter Support  - 1 x daily - 7 x weekly - 2 sets - 10 reps - Mini Squat with Counter Support  - 1 x daily - 7 x weekly - 2 sets - 10 reps - Standing Lumbar Extension at Wall - Forearms  - 5 x daily - 7 x weekly - 1 sets - 5 reps - 3 seconds hold - Standing Hip Hiking  - 5 x daily - 7 x weekly - 1 sets - 5 reps - 3 seconds hold - Prone Hip Extension  - 2 x daily - 7 x weekly - 1-2 sets - 10 reps - 3 seconds hold - Romberg Stance with Head Rotation  - 1 x daily - 7 x weekly - 2 sets - 10 reps - Romberg Stance with Eyes Closed  - 1 x daily - 7 x weekly - 2 sets - 10 reps - Step Up  - 1 x daily - 7 x weekly - 2 sets - 10 reps - Standing Forward Step Taps with Counter Support  - 1 x daily - 7 x weekly - 2 sets -  10 reps  ASSESSMENT:  CLINICAL IMPRESSION: Patient arrived to session ambulating with RW secondary to a recent fall leading to a trip to the ED. Patient was cleared for any fractures, internal injuries, and head injuries. Patient tolerated all gentle strengthening this date experiencing only discomfort with standing from a sitting position. Patient will continue to benefit from skilled PT.    OBJECTIVE IMPAIRMENTS: Abnormal gait, decreased activity tolerance, decreased balance, decreased coordination, decreased endurance, decreased mobility, difficulty walking, decreased ROM, decreased strength, improper body mechanics, postural dysfunction, and pain.   ACTIVITY LIMITATIONS: carrying, lifting, and stairs  PARTICIPATION LIMITATIONS: meal prep, cleaning, laundry, community activity, and caretaker duties  PERSONAL FACTORS: Time since onset of injury/illness/exacerbation and 3+ comorbidities: GERD, HTN, ulcerative colitis, Turner's syndrome, osteopenia, dyslipidemia are also affecting patient's functional outcome.   REHAB POTENTIAL: Good  CLINICAL DECISION MAKING: Evolving/moderate complexity  EVALUATION  COMPLEXITY: Moderate   GOALS: Goals reviewed with patient? Yes  SHORT TERM GOALS: Target date: 01/28/2024  Patient will show compliance with initial HEP. Baseline: Goal status: Met 01/23/2024  2.  Patient will report pain levels no greater than 5/10 to show improved overall quality of life. Baseline:  Goal status: ongoing, 02/10/2024   LONG TERM GOALS: Target date: 03/03/2024  Patient will show independence with final HEP in order to maintain and progress upon functional gains made within PT. Baseline:  Goal status: INITIAL  2.  Patient will report pain levels no greater than 3/10 to show improved overall quality of life. Baseline:  Goal status: INITIAL  3.  Patient will increase PSFS to at least 4 to show significant improvement in subjective disability rating. Baseline:  Goal status: INITIAL  4.  Patient will increase hip abduction MMT to at least 4-/5 in order to show improved biomechanics with functional mobility and gait.  Baseline:  Goal status: INITIAL  5.  Patient will improve 4 stance balance in order to show increase in steadiness on feet.  Baseline:  Goal status: INITIAL  PLAN:  PT FREQUENCY: 1-2x/week  PT DURATION: 8 weeks  PLANNED INTERVENTIONS: 97164- PT Re-evaluation, 97750- Physical Performance Testing, 97110-Therapeutic exercises, 97530- Therapeutic activity, [id number]- Neuromuscular re-education, 97535- Self Care, [id number]- Manual therapy, [phone]- Gait training, [phone]- Electrical stimulation (unattended), [phone]- Electrical stimulation (manual), [id number]- Vasopneumatic device, [id number]- Ultrasound, [id number]- Traction (mechanical), [id number]- Ionotophoresis 4mg /ml Dexamethasone , 79439 (1-2 muscles), [id number] (3+ muscles)- Dry Needling, Patient/Family education, Balance training, Stair training, Taping, Joint mobilization, Joint manipulation, Spinal manipulation, Spinal mobilization, Scar mobilization, Vestibular training, DME instructions, Cryotherapy, and Moist heat.  PLAN FOR NEXT  SESSION:  low back and LE strengthening, static balance, in depth gait assessment, dynamic balance assessment, reprint HEP to include rhomberg stance with head nods instead of with eyes closed (patient not currently ready to perform eyes closed at home)   Susannah Daring, PT, DPT 02/18/24 3:28 PM

## 2024-02-18 ENCOUNTER — Ambulatory Visit

## 2024-02-18 DIAGNOSIS — R2689 Other abnormalities of gait and mobility: Secondary | ICD-10-CM | POA: Diagnosis not present

## 2024-02-18 DIAGNOSIS — M5459 Other low back pain: Secondary | ICD-10-CM

## 2024-02-18 DIAGNOSIS — M6281 Muscle weakness (generalized): Secondary | ICD-10-CM | POA: Diagnosis not present

## 2024-02-18 DIAGNOSIS — R2681 Unsteadiness on feet: Secondary | ICD-10-CM | POA: Diagnosis not present

## 2024-02-18 DIAGNOSIS — M25652 Stiffness of left hip, not elsewhere classified: Secondary | ICD-10-CM

## 2024-02-18 DIAGNOSIS — M25552 Pain in left hip: Secondary | ICD-10-CM

## 2024-02-19 ENCOUNTER — Ambulatory Visit (INDEPENDENT_AMBULATORY_CARE_PROVIDER_SITE_OTHER): Admitting: Orthopedic Surgery

## 2024-02-19 DIAGNOSIS — M25552 Pain in left hip: Secondary | ICD-10-CM

## 2024-02-19 NOTE — Progress Notes (Signed)
 Orthopedic Spine Surgery Office Note   Assessment: Patient is a 68 y.o. female with left lateral thigh pain.  Prior MRI from April 2025 does not show the lateral disc herniation at L5/S1 so I do not think her pain is coming from that.  I do not see any lumbar spine pathology besides that on her more recent MRI from June 2025 that would explain lateral thigh pain     Plan: -Patient has tried PT, Celebrex , oral steroids, left hip intra-articular injection - Recommended she continue with PT.  Since she has not found the Lyrica  helpful, I told her she can stop that -She should do the home exercise program when she completes PT -If pain is still uncontrolled with the PT, next step would be pain management but I discussed with her -Patient should return to office on an as-needed basis     Patient expressed understanding of the plan and all questions were answered to the patient's satisfaction.    ___________________________________________________________________________     History:   Patient is a 68 y.o. female who presents today for follow up on her lumbar spine.  She comes in today with persistent left lateral thigh pain.  She did have a fall recently and noticed increase in her pain since that fall.  She has not developed any new symptoms since that fall.  She was taking the Lyrica  but did not find that helpful at all.  She was working with physical therapy as well and has found that to be helpful.  She still has some additional sessions with PT.  She has been working on the home exercise program.   Treatments tried: PT, Celebrex , oral steroids, left hip intra-articular injection     Physical Exam:   General: no acute distress, appears stated age Neurologic: alert, answering questions appropriately, following commands Respiratory: unlabored breathing on room air, symmetric chest rise Psychiatric: appropriate affect, normal cadence to speech     MSK (spine):   -Strength exam                                                    Left                  Right EHL                              5/5                  5/5 TA                                 5/5                  5/5 GSC                             5/5                  5/5 Knee extension            5/5                  5/5 Hip flexion  5/5                  5/5   -Sensory exam                           Sensation intact to light touch in L3-S1 nerve distributions of bilateral lower extremities   Imaging: XRs of the lumbar spine from 12/19/2023 were previously independently reviewed and interpreted, showing disc at loss at L4/5 and L5/S1.  No evidence of instability on flexion/extension views.  No fracture or dislocation seen.   MRI of the lumbar spine from 10/20/2023 was previously independently reviewed and interpreted, showing far lateral disc herniation on the left at L5/S1.  No other neural compression seen.  No significant central, lateral recess, foraminal stenosis seen.  CT of the pelvis from 02/16/2024 was independently reviewed and interpreted, showing no fracture or dislocation.  There is a cephalomedullary rod within the left femur.  The distal aspect of the rod is not visualized.  There appears to be callus and bridging bone across the proximal femur.  No lucency seen around the rod.  Small subtle lucency seen around the inferior aspect of the lag screw.     Patient name: [name] Patient MRN: [id number] Date of visit: 02/19/24

## 2024-02-20 ENCOUNTER — Ambulatory Visit: Admitting: Orthopedic Surgery

## 2024-02-24 ENCOUNTER — Ambulatory Visit: Admitting: Orthopedic Surgery

## 2024-02-24 NOTE — Therapy (Signed)
 OUTPATIENT PHYSICAL THERAPY THORACOLUMBAR TREATMENT   Patient Name: [name] MRN: [id number] DOB:July 27, 1955, 68 y.o., female Today's Date: 02/25/2024  END OF SESSION:  PT End of Session - 02/25/24 0932     Visit Number 6    Number of Visits 16    Date for Recertification  03/03/24    Authorization Type BCBS MEDICARE $20 COPAY    Progress Note Due on Visit 10    PT Start Time 0932    PT Stop Time 1015    PT Time Calculation (min) 43 min    Activity Tolerance Patient tolerated treatment well;No increased pain    Behavior During Therapy [facility] for tasks assessed/performed               Past Medical History:  Diagnosis Date   B12 DEFICIENCY 02/09/2009   Qualifier: Diagnosis of  By: Joshua RN, CGRN, Sheri     Blood in stool    COLONIC POLYPS, HYPERPLASTIC, HX OF 02/03/2009   Qualifier: Diagnosis of  By: Surface RN, Arland MENDS, COLON 02/03/2009   Qualifier: Diagnosis of  By: Surface RN, Donna     Dyslipidemia 06/20/2012   Fracture, intertrochanteric, left femur (HCC) 05/02/2012   GERD (gastroesophageal reflux disease)    Hearing loss    Bil/has hearing aids   Hemorrhoids    HEMORRHOIDS 02/03/2009   Qualifier: Diagnosis of  By: Surface RN, Arland     Hypertension    INFLAMMATORY BOWEL DISEASE - Followed by Dr. Jakie in GI 02/03/2009   Qualifier: Diagnosis of  By: Surface RN, Arland Bristle    Turner's syndrome    was on Provera and Premarin and d/c this  at age 12yr   Ulcerative colitis    Past Surgical History:  Procedure Laterality Date   COLONOSCOPY  last 03/25/2013   HERNIA REPAIR  2009   lt ing    INCISION AND DRAINAGE  2004    lt thumb dog bite   INTRAMEDULLARY (IM) NAIL INTERTROCHANTERIC  05/02/2012   Procedure: INTRAMEDULLARY (IM) NAIL INTERTROCHANTRIC;  Surgeon: Fonda SHAUNNA Olmsted, MD;  Location: [facility] OR;  Service: Orthopedics;  Laterality: Left;   ORIF FINGER FRACTURE  06/14/2011   Procedure: OPEN REDUCTION INTERNAL FIXATION (ORIF)  METACARPAL (FINGER) FRACTURE;  Surgeon: Franky JONELLE Curia, MD;  Location: [facility];  Service: Orthopedics;  Laterality: Right;  right ring   POLYPECTOMY     TONSILLECTOMY     UPPER GASTROINTESTINAL ENDOSCOPY     Patient Active Problem List   Diagnosis Date Noted   Hyponatremia 10/19/2023   Thrombocytopenia 10/19/2023   Acute leg pain, left 07/19/2022   Osteopenia/?Osteoporosis - followed by her gynecologist, Dr. Kandyce 06/20/2012   Dyslipidemia 06/20/2012   H/O Turner syndrome 05/02/2012   Essential hypertension 02/03/2009   INFLAMMATORY BOWEL DISEASE - Followed by GI 02/03/2009   History of colonic polyps 02/03/2009    PCP: Heron CHRISTELLA Sharper, MD   REFERRING PROVIDER: [name], MD  REFERRING DIAG: M54.50 (ICD-10-CM) - Lumbar pain  Rationale for Evaluation and Treatment: Rehabilitation  THERAPY DIAG:  Other low back pain  Muscle weakness (generalized)  Other abnormalities of gait and mobility  Unsteadiness on feet  Pain in left hip  Stiffness of left hip, not elsewhere classified  ONSET DATE: 4 years ago when it started; 2 years it has gotten worse   SUBJECTIVE:  SUBJECTIVE STATEMENT: Patient reports improvement in symptoms since fall, though still having minimal pain in Lt lateral hip/glute.  PERTINENT HISTORY:  Patient states she experienced a femur fracture in 2013 from a fall at work, but has not had any other injuries or surgeries to the area. Patient received a cortisone shot for Left hip pain then went to ER 3 days as she started having slurred speech, balance issues, and ambulation deficits. After patient was released, she went to Dr. Burnetta and received a prednisone  taper pack which assisted her pain, but she had withdrawal-like symptoms after completing.  Patient also noted she is having platelet issues and is seeing a hematologist.  PAIN:  Are you having pain? Yes: NPRS scale: 0/10 when sitting, painful to touch in Lt hip   Pain location: left lower back and down left LE  Pain description: burning Aggravating factors: walking Relieving factors: sitting/resting  PRECAUTIONS: None  RED FLAGS: Noting some bladder incontinence, but being followed by OB/GYN for that    WEIGHT BEARING RESTRICTIONS: No  FALLS:  Has patient fallen in last 6 months? 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  DIAGNOSTIC FINDINGS:  XRAY: showing disc at loss at L4/5 and L5/S1.  No evidence of  instability on flexion/extension views.  No fracture or dislocation seen.   MRI IMPRESSION: 1. Right eccentric disc bulge and facet arthropathy at L4-L5 resulting in moderate right neural foraminal narrowing. 2. Disc bulge at L5-S1 with lateral disc contacting the exited left L5 nerve root in the extraforaminal zone.  PATIENT SURVEYS:  PSFS: THE PATIENT SPECIFIC FUNCTIONAL SCALE  Place score of 0-10 (0 = unable to perform activity and 10 = able to perform activity at the same level as before injury or problem)  Activity Date: 01/07/2024 02/10/2024   Balance   1 5   2. Walking   3 6   3.     4.      Total Score 2 5.5     Total Score = Sum of activity scores/number of activities  Minimally Detectable Change: 3 points (for single activity); 2 points (for average score)  [facility] (nd). The Patient Specific Functional Scale . Retrieved from [email or web address]   COGNITION: Overall cognitive status: Within functional limits for tasks assessed     SENSATION: Light touch: WFL  MUSCLE LENGTH: Not assessed on eval  POSTURE: rounded shoulders, forward head, and increased thoracic kyphosis  PALPATION: TTP 2/10 at Left upper/lateral glute  LUMBAR ROM:   AROM Eval 01/07/2024 02/10/2024  Flexion WFL   Extension 50%   Right lateral flexion 50-75%   Left lateral flexion 50-75%   Right rotation 50%   Left rotation 50%     (Blank rows = not tested)  LOWER EXTREMITY ROM:     ROM Right Eval 01/07/2024 Left Eval 01/07/2024  Hip flexion [facility]   Hip extension    Hip abduction    Hip adduction    Hip internal rotation    Hip external rotation    Knee flexion  Knee extension    Ankle dorsiflexion    Ankle plantarflexion    Ankle inversion    Ankle eversion     (Blank rows = not tested)  LOWER EXTREMITY MMT:    MMT Right Eval 01/07/2024 Left Eval 01/07/2024  Hip flexion (seated) 5/5 4-/5  Hip extension (prone) 3-/5 3-/5  Hip abduction (sidelying) 3-/5 3-/5  Hip adduction    Hip internal rotation    Hip external rotation    Knee flexion (seated) 5/5 4-/5  Knee extension (seated) 5/5 4/5  Ankle dorsiflexion    Ankle plantarflexion    Ankle inversion    Ankle eversion     (Blank rows = not tested)  LUMBAR SPECIAL TESTS:  Straight leg raise test: Negative, Slump test: Negative, and FABER test: Positive on L, but due to past hip fracture Clonus: negative   FUNCTIONAL TESTS:  5 times sit to stand: 10.27s  performed with SBA 4 stance balance: normal and narrow 10s, unable to complete tandem, 4s Rt 5s Lt single leg with lateral LOB and appropriate stepping reactions ; performed with CGA   GAIT: Distance walked: not formally assessed  Assistive device utilized: None Level of  assistance: SBA Comments: increased IR throughout gait with Left LE, antalgic gait pattern, Left lateral lean with Left stance  TREATMENT DATE:  02/25/2024 TherEx:  Nustep with bilat LE level 4 for 4 minutes ; highly fatiguing for legs  Sit<>stands with 3# DB 2x10 ; slight increase in pain in Lt hip  Standing abduction with yellow TB around knees 2x10 each side   Neuro Re-Ed:  Semi-tandem stance with CGA, increasing challenge by closing gap between feet after each set; 3x30s each LE back Intermittent fingertip use on mat table due to LOB ; improved performance each set  Narrow stance on foam with CGA, 3x30s ; improved performance with each set  Step taps to 4 step with reciprocal pattern 2x20 taps  ; Lt UE use for balance   02/18/2024 TherEx:  Lateral stepping with UE support on parallel bars; 3x down and back  Mini squats with UE support in parallel bars 2x10  Nustep level 3 for 8 minutes with bilat UE and LE  Bridges 2x10 with 2s hold  Supine clamshells with red TB 2x12  Supine marches with TA activation 2x10  Calf raises into toe raises with bilat UE support 1x10; verbal cues required for appropriate form    02/10/2024 TherAct: Standing hip hikes in parallel bars using mirror for visual feedback 2x10 each leg   Step ups using 2 step 2x10 each LE, single UE only for balance support  Reciprocal tap downs using 2 step 2x20, single UE only for balance support  Lateral step ups with 2 step 1x8, 1x10 each side   Neuro Re-Ed:  Grape vine within parallel bars and UE use for balance support  Narrow stance with horizontal head turns with CGA 1x20 head turns  Narrow stance with vertical head turns with CGA 1x20 head turns  Step taps to 2 step with reciprocal pattern 1x12, 1x16 with CGA  Single leg stance with slider under contralateral LE performing lateral slides 2x8 each LE with bilat UE use for balance support   01/30/2024 TherAct:  Standing hip hikes in parallel bars using  mirror for visual feedback for lateral lean 2x10 each leg  Step ups using 4 step 1x10 with Rt leg, 1x6 with Lt leg with patient noting difficulty/pain with LT  Switched to 2 step to finish  Lt leg  PT added to HEP with use of handrail for larger steps and to perform only if pain free  Tap downs from 2 step 1x10 each leg   Neuro Re-Ed:  Modified tandem stance in parallel bars with CGA 2x30s each leg  Patient requiring intermittent UE use on parallel bars due to lateral LOB  Narrow stance with vertical head turns in parallel bars with CGA 1x20 head turns Patient requiring intermittent UE use on parallel bars due to lateral LOB  Narrow stance with horizontal head turns in parallel bars with CGA 1x20 head turns  Patient requiring intermittent UE use on parallel bars due to lateral LOB  Taps to cone in parallel bars with CGA 1x10 with high difficulty  Patient requiring intermittent UE use on parallel bars due to lateral LOB  Taps to step in parallel bars with SBA 1x10 with decreased difficulty  Patient requiring intermittent UE use on parallel bars due to lateral LOB  Discussed 3 balance systems (proprioception, vision, vestibular) and balance strategies (ankle, hip, step) PT added corner narrow stance balance to HEP  01/17/2024 Lumbar extension AROM 10 x 3 seconds Standing hip extensions 10 x 3 seconds Standing alternating hip abduction 2 sets of 10 for 3 seconds  Functional Activities: Hip hike in door frame 2 sets of 5 for 3 seconds Prone hip extension (alternate) 10 x 3 seconds  [id number]: Reviewed spine imaging; spine anatomy with model; practical bed mobility and posture with house chores (dishes); lumbar roll use; disc pressure in various postures                                                                                                                          PATIENT EDUCATION:  Education details: HEP, POC  Person educated: Patient Education method: Explanation,  Demonstration, Tactile cues, Verbal cues, and Handouts Education comprehension: verbalized understanding, returned demonstration, verbal cues required, and tactile cues required  HOME EXERCISE PROGRAM: Access Code: [email or web address] URL: [email or web address] Date: 01/31/2024 Prepared by: Susannah Daring  Exercises - Standing Hip Abduction with Counter Support  - 1 x daily - 7 x weekly - 2 sets - 10 reps - Standing Hip Extension with Counter Support  - 1 x daily - 7 x weekly - 2 sets - 10 reps - Seated Active Straight-Leg Raise  - 1 x daily - 7 x weekly - 2 sets - 10 reps - Heel Raises with Counter Support  - 1 x daily - 7 x weekly - 2 sets - 10 reps - Mini Squat with Counter Support  - 1 x daily - 7 x weekly - 2 sets - 10 reps - Standing Lumbar Extension at Wall - Forearms  - 5 x daily - 7 x weekly - 1 sets - 5 reps - 3 seconds hold - Standing Hip Hiking  - 5 x daily - 7 x weekly - 1 sets - 5 reps - 3 seconds hold - Prone Hip Extension  - 2  x daily - 7 x weekly - 1-2 sets - 10 reps - 3 seconds hold - Romberg Stance with Head Rotation  - 1 x daily - 7 x weekly - 2 sets - 10 reps - Romberg Stance with Eyes Closed  - 1 x daily - 7 x weekly - 2 sets - 10 reps - Step Up  - 1 x daily - 7 x weekly - 2 sets - 10 reps - Standing Forward Step Taps with Counter Support  - 1 x daily - 7 x weekly - 2 sets - 10 reps  ASSESSMENT:  CLINICAL IMPRESSION: Patient arrived to session noting improved symptoms since falls, though having increased pain to touch in Lt thigh/hip. Patient tolerated all activities this date with improved performance in all balance activities this date. Patient will continue to benefit from skilled PT.    OBJECTIVE IMPAIRMENTS: Abnormal gait, decreased activity tolerance, decreased balance, decreased coordination, decreased endurance, decreased mobility, difficulty walking, decreased ROM, decreased strength, improper body mechanics, postural dysfunction, and pain.   ACTIVITY  LIMITATIONS: carrying, lifting, and stairs  PARTICIPATION LIMITATIONS: meal prep, cleaning, laundry, community activity, and caretaker duties  PERSONAL FACTORS: Time since onset of injury/illness/exacerbation and 3+ comorbidities: GERD, HTN, ulcerative colitis, Turner's syndrome, osteopenia, dyslipidemia are also affecting patient's functional outcome.   REHAB POTENTIAL: Good  CLINICAL DECISION MAKING: Evolving/moderate complexity  EVALUATION COMPLEXITY: Moderate   GOALS: Goals reviewed with patient? Yes  SHORT TERM GOALS: Target date: 01/28/2024  Patient will show compliance with initial HEP. Baseline: Goal status: Met 01/23/2024  2.  Patient will report pain levels no greater than 5/10 to show improved overall quality of life. Baseline:  Goal status: ongoing, 02/10/2024   LONG TERM GOALS: Target date: 03/03/2024  Patient will show independence with final HEP in order to maintain and progress upon functional gains made within PT. Baseline:  Goal status: INITIAL  2.  Patient will report pain levels no greater than 3/10 to show improved overall quality of life. Baseline:  Goal status: INITIAL  3.  Patient will increase PSFS to at least 4 to show significant improvement in subjective disability rating. Baseline:  Goal status: INITIAL  4.  Patient will increase hip abduction MMT to at least 4-/5 in order to show improved biomechanics with functional mobility and gait.  Baseline:  Goal status: INITIAL  5.  Patient will improve 4 stance balance in order to show increase in steadiness on feet.  Baseline:  Goal status: INITIAL  PLAN:  PT FREQUENCY: 1-2x/week  PT DURATION: 8 weeks  PLANNED INTERVENTIONS: [id number]- PT Re-evaluation, 97750- Physical Performance Testing, 97110-Therapeutic exercises, 97530- Therapeutic activity, [id number]- Neuromuscular re-education, 97535- Self Care, [id number]- Manual therapy, [phone]- Gait training, [phone]- Electrical stimulation (unattended), [phone]-  Electrical stimulation (manual), [id number]- Vasopneumatic device, [id number]- Ultrasound, [id number]- Traction (mechanical), [id number]- Ionotophoresis 4mg /ml Dexamethasone , [id number] (1-2 muscles), [id number] (3+ muscles)- Dry Needling, Patient/Family education, Balance training, Stair training, Taping, Joint mobilization, Joint manipulation, Spinal manipulation, Spinal mobilization, Scar mobilization, Vestibular training, DME instructions, Cryotherapy, and Moist heat.  PLAN FOR NEXT SESSION:   low back and LE strengthening, static balance, in depth gait assessment, dynamic balance assessment, reprint HEP to include rhomberg stance with head nods instead of with eyes closed (patient not currently ready to perform eyes closed at home)   Susannah Daring, PT, DPT 02/25/24 11:15 AM

## 2024-02-25 ENCOUNTER — Ambulatory Visit

## 2024-02-25 DIAGNOSIS — R2689 Other abnormalities of gait and mobility: Secondary | ICD-10-CM | POA: Diagnosis not present

## 2024-02-25 DIAGNOSIS — M6281 Muscle weakness (generalized): Secondary | ICD-10-CM

## 2024-02-25 DIAGNOSIS — R2681 Unsteadiness on feet: Secondary | ICD-10-CM

## 2024-02-25 DIAGNOSIS — M5459 Other low back pain: Secondary | ICD-10-CM | POA: Diagnosis not present

## 2024-02-25 DIAGNOSIS — M25552 Pain in left hip: Secondary | ICD-10-CM

## 2024-02-25 DIAGNOSIS — M25652 Stiffness of left hip, not elsewhere classified: Secondary | ICD-10-CM

## 2024-03-02 ENCOUNTER — Encounter: Admitting: Hematology & Oncology

## 2024-03-02 ENCOUNTER — Inpatient Hospital Stay

## 2024-03-09 NOTE — Therapy (Signed)
 OUTPATIENT PHYSICAL THERAPY THORACOLUMBAR TREATMENT / PROGRESS NOTE/ RECERTIFICATION   Patient Name: [name] MRN: [id number] DOB:06-18-1955, 68 y.o., female Today's Date: 03/10/2024  Progress Note Reporting Period 01/07/2024 to 03/10/2024  See note below for Objective Data and Assessment of Progress/Goals.    END OF SESSION:  PT End of Session - 03/10/24 0935     Visit Number 7    Number of Visits 23    Date for Recertification  05/05/24    Authorization Type BCBS MEDICARE $20 COPAY    Progress Note Due on Visit 10    PT Start Time 0935    PT Stop Time 1015    PT Time Calculation (min) 40 min    Activity Tolerance Patient tolerated treatment well    Behavior During Therapy [facility] for tasks assessed/performed             Past Medical History:  Diagnosis Date   B12 DEFICIENCY 02/09/2009   Qualifier: Diagnosis of  By: Joshua RN, CGRN, Sheri     Blood in stool    COLONIC POLYPS, HYPERPLASTIC, HX OF 02/03/2009   Qualifier: Diagnosis of  By: Surface RN, Arland MENDS, COLON 02/03/2009   Qualifier: Diagnosis of  By: Surface RN, Donna     Dyslipidemia 06/20/2012   Fracture, intertrochanteric, left femur (HCC) 05/02/2012   GERD (gastroesophageal reflux disease)    Hearing loss    Bil/has hearing aids   Hemorrhoids    HEMORRHOIDS 02/03/2009   Qualifier: Diagnosis of  By: Surface RN, Arland     Hypertension    INFLAMMATORY BOWEL DISEASE - Followed by Dr. Jakie in GI 02/03/2009   Qualifier: Diagnosis of  By: Surface RN, Arland Bristle    Turner's syndrome    was on Provera and Premarin and d/c this  at age 68yr   Ulcerative colitis    Past Surgical History:  Procedure Laterality Date   COLONOSCOPY  last 03/25/2013   HERNIA REPAIR  2009   lt ing    INCISION AND DRAINAGE  2004    lt thumb dog bite   INTRAMEDULLARY (IM) NAIL INTERTROCHANTERIC  05/02/2012   Procedure: INTRAMEDULLARY (IM) NAIL INTERTROCHANTRIC;  Surgeon: Fonda SHAUNNA Olmsted, MD;  Location:  [facility] OR;  Service: Orthopedics;  Laterality: Left;   ORIF FINGER FRACTURE  06/14/2011   Procedure: OPEN REDUCTION INTERNAL FIXATION (ORIF) METACARPAL (FINGER) FRACTURE;  Surgeon: Franky JONELLE Curia, MD;  Location: [facility];  Service: Orthopedics;  Laterality: Right;  right ring   POLYPECTOMY     TONSILLECTOMY     UPPER GASTROINTESTINAL ENDOSCOPY     Patient Active Problem List   Diagnosis Date Noted   Hyponatremia 10/19/2023   Thrombocytopenia 10/19/2023   Acute leg pain, left 07/19/2022   Osteopenia/?Osteoporosis - followed by her gynecologist, Dr. Kandyce 06/20/2012   Dyslipidemia 06/20/2012   H/O Turner syndrome 05/02/2012   Essential hypertension 02/03/2009   INFLAMMATORY BOWEL DISEASE - Followed by GI 02/03/2009   History of colonic polyps 02/03/2009    PCP: Heron CHRISTELLA Sharper, MD   REFERRING PROVIDER: [name], MD  REFERRING DIAG: M54.50 (ICD-10-CM) - Lumbar pain  Rationale for Evaluation and Treatment: Rehabilitation  THERAPY DIAG:  Other low back pain  Muscle weakness (generalized)  Unsteadiness on feet  Pain in left hip  Stiffness of left hip, not elsewhere classified  Other abnormalities of gait and mobility  ONSET DATE: 4 years ago when it started; 2 years it  has gotten worse   SUBJECTIVE:                                                                                                                                                                                           SUBJECTIVE STATEMENT: Patient reports continued soreness from fall that has caused a decrease in her strength.  PERTINENT HISTORY:  Patient states she experienced a femur fracture in 2013 from a fall at work, but has not had any other injuries or surgeries to the area. Patient received a cortisone shot for Left hip pain then went to ER 3 days as she started having slurred speech, balance issues, and ambulation deficits. After patient was released, she went to Dr. Burnetta and  received a prednisone  taper pack which assisted her pain, but she had withdrawal-like symptoms after completing. Patient also noted she is having platelet issues and is seeing a hematologist.  PAIN:  Are you having pain? Yes: NPRS scale: 0/10 at rest, 5/10 with ambulation   Pain location: left lower back and down left LE  Pain description: burning Aggravating factors: walking Relieving factors: sitting/resting  PRECAUTIONS: None  RED FLAGS: Noting some bladder incontinence, but being followed by OB/GYN for that    WEIGHT BEARING RESTRICTIONS: No  FALLS:  Has patient fallen in last 6 months? 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  DIAGNOSTIC FINDINGS:  XRAY: showing disc at loss at L4/5 and L5/S1.  No evidence of  instability on flexion/extension views.  No fracture or dislocation seen.   MRI IMPRESSION: 1. Right eccentric disc bulge and facet arthropathy at L4-L5 resulting in moderate right neural foraminal narrowing. 2. Disc bulge at L5-S1 with lateral disc contacting the exited left L5 nerve root in the extraforaminal zone.  PATIENT SURVEYS:  PSFS: THE PATIENT SPECIFIC FUNCTIONAL SCALE  Place score of 0-10 (0 = unable to perform activity and 10 = able to perform activity at the same level as before injury or problem)  Activity Date: 01/07/2024 02/10/2024 03/10/2024   Balance   1 5 7   2. Walking   3 6 6   3.     4.      Total Score 2 5.5 6.5  Total Score = Sum of activity scores/number of  activities  Minimally Detectable Change: 3 points (for single activity); 2 points (for average score)  [facility] (nd). The Patient Specific Functional Scale . Retrieved from [email or web address]   COGNITION: Overall cognitive status: Within functional limits for tasks assessed     SENSATION: Light touch: WFL  MUSCLE LENGTH: Not assessed on eval  POSTURE: rounded shoulders, forward head, and increased thoracic kyphosis  PALPATION: TTP 2/10 at Left upper/lateral glute  LUMBAR ROM:   AROM Eval 01/07/2024 02/10/2024  Flexion WFL   Extension 50%   Right lateral flexion 50-75%   Left lateral flexion 50-75%   Right rotation 50%   Left rotation 50%     (Blank rows = not tested)  LOWER EXTREMITY ROM:     ROM Right Eval 01/07/2024 Left Eval 01/07/2024  Hip flexion [facility]   Hip extension    Hip abduction    Hip adduction    Hip internal rotation    Hip external rotation    Knee flexion    Knee extension    Ankle dorsiflexion    Ankle plantarflexion    Ankle inversion    Ankle eversion     (Blank rows = not tested)  LOWER EXTREMITY MMT:    MMT Right Eval 01/07/2024 Left Eval 01/07/2024 03/10/2024  Hip flexion (seated) 5/5 4-/5   Hip extension (prone) 3-/5 3-/5   Hip abduction (sidelying) 3-/5 3-/5 Rt: 3+/5 Lt: 3+/5  Hip adduction     Hip internal rotation     Hip external rotation     Knee flexion (seated) 5/5 4-/5   Knee extension (seated) 5/5 4/5   Ankle dorsiflexion     Ankle plantarflexion     Ankle inversion     Ankle eversion      (Blank rows = not tested)  LUMBAR SPECIAL TESTS:  Straight leg raise test: Negative, Slump test: Negative, and FABER test: Positive on L, but due to past hip fracture Clonus: negative   FUNCTIONAL TESTS:  5 times sit to stand: 10.27s  performed with SBA 4 stance balance: normal and narrow 10s, unable to complete tandem, 4s Rt 5s Lt single leg with lateral  LOB and appropriate stepping reactions ; performed with CGA   03/10/2024 5xSTS: 11.22s performed with SBA  4 stance balance: normal: 10s narrow: 10s tandem Rt: 2s tandem LT: 3s Rt LE: 2s Lt LE: 2s   GAIT: Distance walked: not formally assessed  Assistive device utilized: None Level of assistance: SBA Comments: increased IR throughout gait with Left LE, antalgic gait pattern, Left lateral lean with Left stance  TREATMENT DATE:  03/10/2024 TherEx:  UBE with bilat LE level 1.5 for 8 minutes   Supine bridges with green TB 3x10  Supine marches with TA contraction 2x20, first set with no weight, second set with 1# ankle weights  Sit<>stands 2x12 with 3# DB  Calf raises into toe raises 2x10 with UE support   Physical Performance:  5xSTS  4 stance balance assessment  Discussed results of both above assessments with patient   02/25/2024 TherEx:  Nustep with bilat LE level 4 for 4 minutes ; highly fatiguing for legs  Sit<>stands with 3# DB 2x10 ; slight increase in pain in Lt hip  Standing abduction with yellow TB around knees 2x10 each side   Neuro Re-Ed:  Semi-tandem stance with CGA, increasing challenge by closing gap between feet after each set; 3x30s each LE back Intermittent fingertip  use on mat table due to LOB ; improved performance each set  Narrow stance on foam with CGA, 3x30s ; improved performance with each set  Step taps to 4 step with reciprocal pattern 2x20 taps  ; Lt UE use for balance   02/18/2024 TherEx:  Lateral stepping with UE support on parallel bars; 3x down and back  Mini squats with UE support in parallel bars 2x10  Nustep level 3 for 8 minutes with bilat UE and LE  Bridges 2x10 with 2s hold  Supine clamshells with red TB 2x12  Supine marches with TA activation 2x10  Calf raises into toe raises with bilat UE support 1x10; verbal cues required for appropriate form    02/10/2024 TherAct: Standing hip hikes in parallel bars using mirror for visual  feedback 2x10 each leg   Step ups using 2 step 2x10 each LE, single UE only for balance support  Reciprocal tap downs using 2 step 2x20, single UE only for balance support  Lateral step ups with 2 step 1x8, 1x10 each side   Neuro Re-Ed:  Grape vine within parallel bars and UE use for balance support  Narrow stance with horizontal head turns with CGA 1x20 head turns  Narrow stance with vertical head turns with CGA 1x20 head turns  Step taps to 2 step with reciprocal pattern 1x12, 1x16 with CGA  Single leg stance with slider under contralateral LE performing lateral slides 2x8 each LE with bilat UE use for balance support   01/30/2024 TherAct:  Standing hip hikes in parallel bars using mirror for visual feedback for lateral lean 2x10 each leg  Step ups using 4 step 1x10 with Rt leg, 1x6 with Lt leg with patient noting difficulty/pain with LT  Switched to 2 step to finish Lt leg  PT added to HEP with use of handrail for larger steps and to perform only if pain free  Tap downs from 2 step 1x10 each leg   Neuro Re-Ed:  Modified tandem stance in parallel bars with CGA 2x30s each leg  Patient requiring intermittent UE use on parallel bars due to lateral LOB  Narrow stance with vertical head turns in parallel bars with CGA 1x20 head turns Patient requiring intermittent UE use on parallel bars due to lateral LOB  Narrow stance with horizontal head turns in parallel bars with CGA 1x20 head turns  Patient requiring intermittent UE use on parallel bars due to lateral LOB  Taps to cone in parallel bars with CGA 1x10 with high difficulty  Patient requiring intermittent UE use on parallel bars due to lateral LOB  Taps to step in parallel bars with SBA 1x10 with decreased difficulty  Patient requiring intermittent UE use on parallel bars due to lateral LOB  Discussed 3 balance systems (proprioception, vision, vestibular) and balance strategies (ankle, hip, step) PT added corner narrow stance  balance to HEP                                                                                                        PATIENT EDUCATION:  Education details: HEP, POC  Person educated: Patient Education method: Explanation, Demonstration, Tactile cues, Verbal cues, and Handouts Education comprehension: verbalized understanding, returned demonstration, verbal cues required, and tactile cues required  HOME EXERCISE PROGRAM: Access Code: [email or web address] URL: [email or web address] Date: 01/31/2024 Prepared by: Susannah Daring  Exercises - Standing Hip Abduction with Counter Support  - 1 x daily - 7 x weekly - 2 sets - 10 reps - Standing Hip Extension with Counter Support  - 1 x daily - 7 x weekly - 2 sets - 10 reps - Seated Active Straight-Leg Raise  - 1 x daily - 7 x weekly - 2 sets - 10 reps - Heel Raises with Counter Support  - 1 x daily - 7 x weekly - 2 sets - 10 reps - Mini Squat with Counter Support  - 1 x daily - 7 x weekly - 2 sets - 10 reps - Standing Lumbar Extension at Wall - Forearms  - 5 x daily - 7 x weekly - 1 sets - 5 reps - 3 seconds hold - Standing Hip Hiking  - 5 x daily - 7 x weekly - 1 sets - 5 reps - 3 seconds hold - Prone Hip Extension  - 2 x daily - 7 x weekly - 1-2 sets - 10 reps - 3 seconds hold - Romberg Stance with Head Rotation  - 1 x daily - 7 x weekly - 2 sets - 10 reps - Romberg Stance with Eyes Closed  - 1 x daily - 7 x weekly - 2 sets - 10 reps - Step Up  - 1 x daily - 7 x weekly - 2 sets - 10 reps - Standing Forward Step Taps with Counter Support  - 1 x daily - 7 x weekly - 2 sets - 10 reps  ASSESSMENT:  CLINICAL IMPRESSION: Patient arrived to session noting improvement in symptoms, but continues to have difficulty and soreness with ambulation. Patient tolerated all activities this date and is showing improvements in strength. However, continues to have deficits with gait, pain, and balance. Patient will continue to benefit from skilled PT 1-2x/wk  for 8 more weeks.     OBJECTIVE IMPAIRMENTS: Abnormal gait, decreased activity tolerance, decreased balance, decreased coordination, decreased endurance, decreased mobility, difficulty walking, decreased ROM, decreased strength, improper body mechanics, postural dysfunction, and pain.   ACTIVITY LIMITATIONS: carrying, lifting, and stairs  PARTICIPATION LIMITATIONS: meal prep, cleaning, laundry, community activity, and caretaker duties  PERSONAL FACTORS: Time since onset of injury/illness/exacerbation and 3+ comorbidities: GERD, HTN, ulcerative colitis, Turner's syndrome, osteopenia, dyslipidemia are also affecting patient's functional outcome.   REHAB POTENTIAL: Good  CLINICAL DECISION MAKING: Evolving/moderate complexity  EVALUATION COMPLEXITY: Moderate   GOALS: Goals reviewed with patient? Yes  SHORT TERM GOALS: Target date: 01/28/2024  Patient will show compliance with initial HEP. Baseline: Goal status: Met 01/23/2024  2.  Patient will report pain levels no greater than 5/10 to show improved overall quality of life. Baseline:  Goal status: ongoing, 02/10/2024   LONG TERM GOALS: Target date: 05/05/2024  Patient will show independence with final HEP in order to maintain and progress upon functional gains made within PT. Baseline:  Goal status: Goal ongoing, 03/10/2024  2.  Patient will report pain levels no greater than 3/10 to show improved overall quality of life. Baseline:  Goal status: Goal ongoing, 03/10/2024  3.  Patient will increase PSFS to at least 4 to show significant improvement in subjective disability rating. Baseline:  Goal status: GOAL MET, 03/10/2024  4.  Patient will increase hip abduction MMT to at least 4-/5 in order to show improved biomechanics with functional mobility and gait.  Baseline:  Goal status: Goal ongoing, 03/10/2024  5.  Patient will improve 4 stance balance in order to show increase in steadiness on feet.  Baseline:  Goal status:  Goal ongoing, 03/10/2024  PLAN:  PT FREQUENCY: 1-2x/week  PT DURATION: 8 weeks  PLANNED INTERVENTIONS: 97164- PT Re-evaluation, 97750- Physical Performance Testing, 97110-Therapeutic exercises, 97530- Therapeutic activity, 97112- Neuromuscular re-education, 97535- Self Care, [id number]- Manual therapy, [phone]- Gait training, [phone]- Electrical stimulation (unattended), [phone]- Electrical stimulation (manual), [id number]- Vasopneumatic device, [id number]- Ultrasound, [id number]- Traction (mechanical), [id number]- Ionotophoresis 4mg /ml Dexamethasone , [id number] (1-2 muscles), [id number] (3+ muscles)- Dry Needling, Patient/Family education, Balance training, Stair training, Taping, Joint mobilization, Joint manipulation, Spinal manipulation, Spinal mobilization, Scar mobilization, Vestibular training, DME instructions, Cryotherapy, and Moist heat.  PLAN FOR NEXT SESSION:   generalized LE strengthening, gait, balance   Susannah Daring, PT, DPT 03/10/24 10:28 AM

## 2024-03-10 ENCOUNTER — Ambulatory Visit

## 2024-03-10 DIAGNOSIS — M25552 Pain in left hip: Secondary | ICD-10-CM

## 2024-03-10 DIAGNOSIS — R2681 Unsteadiness on feet: Secondary | ICD-10-CM | POA: Diagnosis not present

## 2024-03-10 DIAGNOSIS — M5459 Other low back pain: Secondary | ICD-10-CM

## 2024-03-10 DIAGNOSIS — M6281 Muscle weakness (generalized): Secondary | ICD-10-CM | POA: Diagnosis not present

## 2024-03-10 DIAGNOSIS — R2689 Other abnormalities of gait and mobility: Secondary | ICD-10-CM

## 2024-03-10 DIAGNOSIS — M25652 Stiffness of left hip, not elsewhere classified: Secondary | ICD-10-CM

## 2024-03-16 ENCOUNTER — Encounter: Payer: Self-pay | Admitting: Hematology & Oncology

## 2024-03-16 ENCOUNTER — Inpatient Hospital Stay

## 2024-03-16 ENCOUNTER — Inpatient Hospital Stay: Admitting: Hematology & Oncology

## 2024-03-16 ENCOUNTER — Inpatient Hospital Stay: Attending: Nurse Practitioner

## 2024-03-16 VITALS — BP 121/61 | HR 84 | Temp 98.0°F | Resp 18 | Ht <= 58 in | Wt 156.0 lb

## 2024-03-16 DIAGNOSIS — D696 Thrombocytopenia, unspecified: Secondary | ICD-10-CM | POA: Diagnosis not present

## 2024-03-16 DIAGNOSIS — D72829 Elevated white blood cell count, unspecified: Secondary | ICD-10-CM | POA: Insufficient documentation

## 2024-03-16 DIAGNOSIS — Q969 Turner's syndrome, unspecified: Secondary | ICD-10-CM | POA: Insufficient documentation

## 2024-03-16 LAB — CBC WITH DIFFERENTIAL (CANCER CENTER ONLY)
Abs Immature Granulocytes: 0.25 K/uL — ABNORMAL HIGH (ref 0.00–0.07)
Basophils Absolute: 0 K/uL (ref 0.0–0.1)
Basophils Relative: 0 %
Eosinophils Absolute: 0.1 K/uL (ref 0.0–0.5)
Eosinophils Relative: 1 %
HCT: 40.7 % (ref 36.0–46.0)
Hemoglobin: 12.5 g/dL (ref 12.0–15.0)
Immature Granulocytes: 2 %
Lymphocytes Relative: 23 %
Lymphs Abs: 2.6 K/uL (ref 0.7–4.0)
MCH: 27.1 pg (ref 26.0–34.0)
MCHC: 30.7 g/dL (ref 30.0–36.0)
MCV: 88.3 fL (ref 80.0–100.0)
Monocytes Absolute: 2.8 K/uL — ABNORMAL HIGH (ref 0.1–1.0)
Monocytes Relative: 24 %
Neutro Abs: 5.8 K/uL (ref 1.7–7.7)
Neutrophils Relative %: 50 %
Platelet Count: 51 K/uL — ABNORMAL LOW (ref 150–400)
RBC: 4.61 MIL/uL (ref 3.87–5.11)
RDW: 17.3 % — ABNORMAL HIGH (ref 11.5–15.5)
WBC Count: 11.6 K/uL — ABNORMAL HIGH (ref 4.0–10.5)
nRBC: 0 % (ref 0.0–0.2)

## 2024-03-16 LAB — CMP (CANCER CENTER ONLY)
ALT: 33 U/L (ref 0–44)
AST: 40 U/L (ref 15–41)
Albumin: 4.8 g/dL (ref 3.5–5.0)
Alkaline Phosphatase: 92 U/L (ref 38–126)
Anion gap: 12 (ref 5–15)
BUN: 23 mg/dL (ref 8–23)
CO2: 24 mmol/L (ref 22–32)
Calcium: 10.2 mg/dL (ref 8.9–10.3)
Chloride: 106 mmol/L (ref 98–111)
Creatinine: 0.9 mg/dL (ref 0.44–1.00)
GFR, Estimated: >60 mL/min (ref >60)
Glucose, Bld: 110 mg/dL — ABNORMAL HIGH (ref 70–99)
Potassium: 5 mmol/L (ref 3.5–5.1)
Sodium: 142 mmol/L (ref 135–145)
Total Bilirubin: 0.8 mg/dL (ref 0.0–1.2)
Total Protein: 7 g/dL (ref 6.5–8.1)

## 2024-03-16 LAB — SAVE SMEAR(SSMR), FOR PROVIDER SLIDE REVIEW

## 2024-03-16 LAB — LACTATE DEHYDROGENASE: LDH: 210 U/L — ABNORMAL HIGH (ref 98–192)

## 2024-03-16 NOTE — Progress Notes (Signed)
 [facility] OFFICE PROGRESS NOTE   Diagnosis: Leukocytosis, thrombocytopenia  INTERVAL HISTORY:   Ms. Valtierra comes to the office for her first visit.  She is very very nice.  She comes with her mother who is from [place] .  Her mom is 67 years old.  Ms. Cleaver has thrombocytopenia.  She actually had a bone marrow biopsy that was done back in March.  The pathology report [id number]) showed a mildly hypercellular marrow with mild myeloid hyperplasia and scattered lymphoid aggregates.  There is no monoclonal lymphocytes.  CD34 cells were normal.  She had adequate megakaryocytes.  I think that she had a normal cytogenetic study.    Is not clear exactly what the diagnosis is.  She apparently was not happy with the fact that no one knows what is going on.  As such, she referred herself to the [facility].  She has had no problems with bleeding or bruising.  She has had no problems with cough.  Has been no further nausea or vomiting.  She feels that she has an allergy to prednisone .  She gets regular mammograms.  She also gets regular colonoscopies.  She has had no problems with weight loss or weight gain.  She is not a vegetarian.  I think she had a CT scan that was done of the abdomen pelvis back in May 2025.  This showed possible fatty liver.  There is no splenomegaly.  She has had no rashes.  There is been no swollen lymph nodes.  Of note, she does have Turner's syndrome.  Currently, I would say that her performance status is probably ECOG 1.    Objective:  Vital signs in last 24 hours:  Blood pressure 121/61, pulse 84, temperature 98 F (36.7 C), temperature source Oral, resp. rate 18, height 4' 9 (1.448 m), weight 156 lb (70.8 kg), SpO2 97%.   Physical Exam Vitals reviewed.  HENT:     Head: Normocephalic and atraumatic.  Eyes:     Pupils: Pupils are equal, round, and reactive to light.  Cardiovascular:     Rate and Rhythm: Normal  rate and regular rhythm.     Heart sounds: Normal heart sounds.  Pulmonary:     Effort: Pulmonary effort is normal.     Breath sounds: Normal breath sounds.  Abdominal:     General: Bowel sounds are normal.     Palpations: Abdomen is soft.  Musculoskeletal:        General: No tenderness or deformity. Normal range of motion.     Cervical back: Normal range of motion.  Lymphadenopathy:     Cervical: No cervical adenopathy.  Skin:    General: Skin is warm and dry.     Findings: No erythema or rash.  Neurological:     Mental Status: She is alert and oriented to person, place, and time.  Psychiatric:        Behavior: Behavior normal.        Thought Content: Thought content normal.        Judgment: Judgment normal.    Lab Results:  Lab Results  Component Value Date   WBC 11.6 (H) 03/16/2024   HGB 12.5 03/16/2024   HCT 40.7 03/16/2024   MCV 88.3 03/16/2024   PLT 51 (L) 03/16/2024   NEUTROABS 5.8 03/16/2024    CMP  Lab Results  Component Value Date   NA 142 03/16/2024   K 5.0 03/16/2024   CL 106 03/16/2024  CO2 24 03/16/2024   GLUCOSE 110 (H) 03/16/2024   BUN 23 03/16/2024   CREATININE 0.90 03/16/2024   CALCIUM  10.2 03/16/2024   PROT 7.0 03/16/2024   ALBUMIN  4.8 03/16/2024   AST 40 03/16/2024   ALT 33 03/16/2024   ALKPHOS 92 03/16/2024   BILITOT 0.8 03/16/2024   GFRNONAA >60 03/16/2024   GFRAA >90 05/07/2012    Assessment/Plan:  Ms. Jolin is a very nice 68 year old white female.  She has chronic thrombocytopenia.  She has little bit of leukocytosis.  Right now, I am going to send off her peripheral blood for NGS myeloid studies.  It would be interesting to see if she actually may have possible myelodysplasia.  I suppose that she could have ITP.  This is certainly a mild case if that would be the situation.  Right now, I think that we do not have a real answer.  I probably would like to get another CT scan.  I will like to get a CT of the body to see if  there is any adenopathy.  We could also recheck her liver and spleen to see if there is any changes.  I realize this is somewhat frustrating for her.  Again, sometimes we just do not have a good explanation as to why there are abnormalities with the blood.  However, we will do our best to try to figure out what might be going on and see if we have to do any therapy.  Follow-up I will have her come back to see us  after we get the results back from the NGS studies.   [name], MD  03/16/2024  5:07 PM

## 2024-03-17 ENCOUNTER — Other Ambulatory Visit (HOSPITAL_BASED_OUTPATIENT_CLINIC_OR_DEPARTMENT_OTHER): Payer: Self-pay | Admitting: Obstetrics

## 2024-03-17 DIAGNOSIS — M810 Age-related osteoporosis without current pathological fracture: Secondary | ICD-10-CM

## 2024-03-19 NOTE — Therapy (Signed)
 OUTPATIENT PHYSICAL THERAPY THORACOLUMBAR TREATMENT / PROGRESS NOTE/ RECERTIFICATION   Patient Name: [name] MRN: [id number] DOB:1956-02-22, 68 y.o., female Today's Date: 03/19/2024  Progress Note Reporting Period 01/07/2024 to 03/10/2024  See note below for Objective Data and Assessment of Progress/Goals.    END OF SESSION:       Past Medical History:  Diagnosis Date   B12 DEFICIENCY 02/09/2009   Qualifier: Diagnosis of  By: Joshua RN, CGRN, Sheri     Blood in stool    COLONIC POLYPS, HYPERPLASTIC, HX OF 02/03/2009   Qualifier: Diagnosis of  By: Surface RN, Arland MENDS, COLON 02/03/2009   Qualifier: Diagnosis of  By: Surface RN, Donna     Dyslipidemia 06/20/2012   Fracture, intertrochanteric, left femur (HCC) 05/02/2012   GERD (gastroesophageal reflux disease)    Hearing loss    Bil/has hearing aids   Hemorrhoids    HEMORRHOIDS 02/03/2009   Qualifier: Diagnosis of  By: Surface RN, Arland     Hypertension    INFLAMMATORY BOWEL DISEASE - Followed by Dr. Jakie in GI 02/03/2009   Qualifier: Diagnosis of  By: Surface RN, Arland Bristle    Turner's syndrome    was on Provera and Premarin and d/c this  at age 61yr   Ulcerative colitis    Past Surgical History:  Procedure Laterality Date   COLONOSCOPY  last 03/25/2013   HERNIA REPAIR  2009   lt ing    INCISION AND DRAINAGE  2004    lt thumb dog bite   INTRAMEDULLARY (IM) NAIL INTERTROCHANTERIC  05/02/2012   Procedure: INTRAMEDULLARY (IM) NAIL INTERTROCHANTRIC;  Surgeon: Fonda SHAUNNA Olmsted, MD;  Location: [facility] OR;  Service: Orthopedics;  Laterality: Left;   ORIF FINGER FRACTURE  06/14/2011   Procedure: OPEN REDUCTION INTERNAL FIXATION (ORIF) METACARPAL (FINGER) FRACTURE;  Surgeon: Franky JONELLE Curia, MD;  Location: [facility];  Service: Orthopedics;  Laterality: Right;  right ring   POLYPECTOMY     TONSILLECTOMY     UPPER GASTROINTESTINAL ENDOSCOPY     Patient Active Problem List    Diagnosis Date Noted   Hyponatremia 10/19/2023   Thrombocytopenia 10/19/2023   Acute leg pain, left 07/19/2022   Osteopenia/?Osteoporosis - followed by her gynecologist, Dr. Kandyce 06/20/2012   Dyslipidemia 06/20/2012   H/O Turner syndrome 05/02/2012   Essential hypertension 02/03/2009   INFLAMMATORY BOWEL DISEASE - Followed by GI 02/03/2009   History of colonic polyps 02/03/2009    PCP: Heron CHRISTELLA Sharper, MD   REFERRING PROVIDER: [name], MD  REFERRING DIAG: M54.50 (ICD-10-CM) - Lumbar pain  Rationale for Evaluation and Treatment: Rehabilitation  THERAPY DIAG:  No diagnosis found.  ONSET DATE: 4 years ago when it started; 2 years it has gotten worse   SUBJECTIVE:  SUBJECTIVE STATEMENT: *** Patient reports continued soreness from fall that has caused a decrease in her strength.  PERTINENT HISTORY:  Patient states she experienced a femur fracture in 2013 from a fall at work, but has not had any other injuries or surgeries to the area. Patient received a cortisone shot for Left hip pain then went to ER 3 days as she started having slurred speech, balance issues, and ambulation deficits. After patient was released, she went to Dr. Burnetta and received a prednisone  taper pack which assisted her pain, but she had withdrawal-like symptoms after completing. Patient also noted she is having platelet issues and is seeing a hematologist.  PAIN:  Are you having pain? Yes: NPRS scale: 0/10 at rest, 5/10 with ambulation   Pain location: left lower back and down left LE  Pain description: burning Aggravating factors: walking Relieving factors: sitting/resting  PRECAUTIONS: None  RED FLAGS: Noting some bladder incontinence, but being followed by OB/GYN for that    WEIGHT BEARING RESTRICTIONS:  No  FALLS:  Has patient fallen in last 6 months? 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  DIAGNOSTIC FINDINGS:  XRAY: showing disc at loss at L4/5 and L5/S1.  No evidence of  instability on flexion/extension views.  No fracture or dislocation seen.   MRI IMPRESSION: 1. Right eccentric disc bulge and facet arthropathy at L4-L5 resulting in moderate right neural foraminal narrowing. 2. Disc bulge at L5-S1 with lateral disc contacting the exited left L5 nerve root in the extraforaminal zone.  PATIENT SURVEYS:  PSFS: THE PATIENT SPECIFIC FUNCTIONAL SCALE  Place score of 0-10 (0 = unable to perform activity and 10 = able to perform activity at the same level as before injury or problem)  Activity Date: 01/07/2024 02/10/2024 03/10/2024   Balance   1 5 7   2. Walking   3 6 6   3.     4.      Total Score 2 5.5 6.5    Total Score = Sum of activity scores/number of activities  Minimally Detectable Change: 3 points (for single activity); 2 points (for average score)  [facility] (nd). The Patient Specific Functional Scale . Retrieved from [email or web address]   COGNITION: Overall cognitive status: Within functional limits for tasks assessed     SENSATION: Light touch: WFL  MUSCLE LENGTH: Not assessed on eval  POSTURE: rounded shoulders, forward head, and increased thoracic kyphosis  PALPATION: TTP 2/10 at Left upper/lateral glute  LUMBAR  ROM:   AROM Eval 01/07/2024 02/10/2024  Flexion WFL   Extension 50%   Right lateral flexion 50-75%   Left lateral flexion 50-75%   Right rotation 50%   Left rotation 50%     (Blank rows = not tested)  LOWER EXTREMITY ROM:     ROM Right Eval 01/07/2024 Left Eval 01/07/2024  Hip flexion [facility]   Hip extension    Hip abduction    Hip adduction    Hip internal rotation    Hip external rotation    Knee flexion    Knee extension  Ankle dorsiflexion    Ankle plantarflexion    Ankle inversion    Ankle eversion     (Blank rows = not tested)  LOWER EXTREMITY MMT:    MMT Right Eval 01/07/2024 Left Eval 01/07/2024 03/10/2024  Hip flexion (seated) 5/5 4-/5   Hip extension (prone) 3-/5 3-/5   Hip abduction (sidelying) 3-/5 3-/5 Rt: 3+/5 Lt: 3+/5  Hip adduction     Hip internal rotation     Hip external rotation     Knee flexion (seated) 5/5 4-/5   Knee extension (seated) 5/5 4/5   Ankle dorsiflexion     Ankle plantarflexion     Ankle inversion     Ankle eversion      (Blank rows = not tested)  LUMBAR SPECIAL TESTS:  Straight leg raise test: Negative, Slump test: Negative, and FABER test: Positive on L, but due to past hip fracture Clonus: negative   FUNCTIONAL TESTS:  5 times sit to stand: 10.27s  performed with SBA 4 stance balance: normal and narrow 10s, unable to complete tandem, 4s Rt 5s Lt single leg with lateral LOB and appropriate stepping reactions ; performed with CGA   03/10/2024 5xSTS: 11.22s performed with SBA  4 stance balance: normal: 10s narrow: 10s tandem Rt: 2s tandem LT: 3s Rt LE: 2s Lt LE: 2s   GAIT: Distance walked: not formally assessed  Assistive device utilized: None Level of assistance: SBA Comments: increased IR throughout gait with Left LE, antalgic gait pattern, Left lateral lean with Left stance  TREATMENT DATE:  03/20/2024 ***  03/10/2024 TherEx:  UBE with bilat LE level 1.5 for 8 minutes   Supine bridges with green TB 3x10   Supine marches with TA contraction 2x20, first set with no weight, second set with 1# ankle weights  Sit<>stands 2x12 with 3# DB  Calf raises into toe raises 2x10 with UE support   Physical Performance:  5xSTS  4 stance balance assessment  Discussed results of both above assessments with patient   02/25/2024 TherEx:  Nustep with bilat LE level 4 for 4 minutes ; highly fatiguing for legs  Sit<>stands with 3# DB 2x10 ; slight increase in pain in Lt hip  Standing abduction with yellow TB around knees 2x10 each side   Neuro Re-Ed:  Semi-tandem stance with CGA, increasing challenge by closing gap between feet after each set; 3x30s each LE back Intermittent fingertip use on mat table due to LOB ; improved performance each set  Narrow stance on foam with CGA, 3x30s ; improved performance with each set  Step taps to 4 step with reciprocal pattern 2x20 taps  ; Lt UE use for balance   02/18/2024 TherEx:  Lateral stepping with UE support on parallel bars; 3x down and back  Mini squats with UE support in parallel bars 2x10  Nustep level 3 for 8 minutes with bilat UE and LE  Bridges 2x10 with 2s hold  Supine clamshells with red TB 2x12  Supine marches with TA activation 2x10  Calf raises into toe raises with bilat UE support 1x10; verbal cues required for appropriate form  PATIENT EDUCATION:  Education details: HEP, POC  Person educated: Patient Education method: Explanation, Demonstration, Tactile cues, Verbal cues, and Handouts Education comprehension: verbalized understanding, returned demonstration, verbal cues required, and tactile cues required  HOME EXERCISE PROGRAM: Access Code: [email or web address] URL: [email or web address] Date: 01/31/2024 Prepared by: Susannah Daring  Exercises - Standing Hip Abduction with Counter Support  - 1 x daily - 7 x weekly - 2 sets - 10 reps - Standing Hip  Extension with Counter Support  - 1 x daily - 7 x weekly - 2 sets - 10 reps - Seated Active Straight-Leg Raise  - 1 x daily - 7 x weekly - 2 sets - 10 reps - Heel Raises with Counter Support  - 1 x daily - 7 x weekly - 2 sets - 10 reps - Mini Squat with Counter Support  - 1 x daily - 7 x weekly - 2 sets - 10 reps - Standing Lumbar Extension at Wall - Forearms  - 5 x daily - 7 x weekly - 1 sets - 5 reps - 3 seconds hold - Standing Hip Hiking  - 5 x daily - 7 x weekly - 1 sets - 5 reps - 3 seconds hold - Prone Hip Extension  - 2 x daily - 7 x weekly - 1-2 sets - 10 reps - 3 seconds hold - Romberg Stance with Head Rotation  - 1 x daily - 7 x weekly - 2 sets - 10 reps - Romberg Stance with Eyes Closed  - 1 x daily - 7 x weekly - 2 sets - 10 reps - Step Up  - 1 x daily - 7 x weekly - 2 sets - 10 reps - Standing Forward Step Taps with Counter Support  - 1 x daily - 7 x weekly - 2 sets - 10 reps  ASSESSMENT:  CLINICAL IMPRESSION: *** Patient arrived to session noting improvement in symptoms, but continues to have difficulty and soreness with ambulation. Patient tolerated all activities this date and is showing improvements in strength. However, continues to have deficits with gait, pain, and balance. Patient will continue to benefit from skilled PT 1-2x/wk for 8 more weeks.     OBJECTIVE IMPAIRMENTS: Abnormal gait, decreased activity tolerance, decreased balance, decreased coordination, decreased endurance, decreased mobility, difficulty walking, decreased ROM, decreased strength, improper body mechanics, postural dysfunction, and pain.   ACTIVITY LIMITATIONS: carrying, lifting, and stairs  PARTICIPATION LIMITATIONS: meal prep, cleaning, laundry, community activity, and caretaker duties  PERSONAL FACTORS: Time since onset of injury/illness/exacerbation and 3+ comorbidities: GERD, HTN, ulcerative colitis, Turner's syndrome, osteopenia, dyslipidemia are also affecting patient's functional outcome.    REHAB POTENTIAL: Good  CLINICAL DECISION MAKING: Evolving/moderate complexity  EVALUATION COMPLEXITY: Moderate   GOALS: Goals reviewed with patient? Yes  SHORT TERM GOALS: Target date: 01/28/2024  Patient will show compliance with initial HEP. Baseline: Goal status: Met 01/23/2024  2.  Patient will report pain levels no greater than 5/10 to show improved overall quality of life. Baseline:  Goal status: ongoing, 02/10/2024   LONG TERM GOALS: Target date: 05/05/2024  Patient will show independence with final HEP in order to maintain and progress upon functional gains made within PT. Baseline:  Goal status: Goal ongoing, 03/10/2024  2.  Patient will report pain levels no greater than 3/10 to show improved overall quality of life. Baseline:  Goal status: Goal ongoing, 03/10/2024  3.  Patient will increase PSFS to at least 4 to show significant improvement in subjective disability rating. Baseline:  Goal status: GOAL MET, 03/10/2024  4.  Patient will increase hip abduction MMT to at least 4-/5 in order to show improved biomechanics with functional mobility and gait.  Baseline:  Goal status: Goal ongoing, 03/10/2024  5.  Patient will improve 4 stance balance in order to show increase in steadiness on feet.  Baseline:  Goal status: Goal ongoing, 03/10/2024  PLAN:  PT FREQUENCY: 1-2x/week  PT DURATION: 8 weeks  PLANNED INTERVENTIONS: 97164- PT Re-evaluation, 97750- Physical Performance Testing, 97110-Therapeutic exercises, 97530- Therapeutic activity, [id number]- Neuromuscular re-education, 97535- Self Care, [id number]- Manual therapy, [phone]- Gait training, [phone]- Electrical stimulation (unattended), [phone]- Electrical stimulation (manual), [id number]- Vasopneumatic device, [id number]- Ultrasound, [id number]- Traction (mechanical), [id number]- Ionotophoresis 4mg /ml Dexamethasone , [id number] (1-2 muscles), [id number] (3+ muscles)- Dry Needling, Patient/Family education, Balance training, Stair training, Taping, Joint  mobilization, Joint manipulation, Spinal manipulation, Spinal mobilization, Scar mobilization, Vestibular training, DME instructions, Cryotherapy, and Moist heat.  PLAN FOR NEXT SESSION:   *** generalized LE strengthening, gait, balance   Susannah Daring, PT, DPT 03/19/24 11:25 AM

## 2024-03-20 ENCOUNTER — Ambulatory Visit

## 2024-03-20 DIAGNOSIS — M5459 Other low back pain: Secondary | ICD-10-CM

## 2024-03-20 DIAGNOSIS — R2689 Other abnormalities of gait and mobility: Secondary | ICD-10-CM

## 2024-03-20 DIAGNOSIS — M25552 Pain in left hip: Secondary | ICD-10-CM | POA: Diagnosis not present

## 2024-03-20 DIAGNOSIS — M6281 Muscle weakness (generalized): Secondary | ICD-10-CM | POA: Diagnosis not present

## 2024-03-20 DIAGNOSIS — R2681 Unsteadiness on feet: Secondary | ICD-10-CM | POA: Diagnosis not present

## 2024-03-20 DIAGNOSIS — M25652 Stiffness of left hip, not elsewhere classified: Secondary | ICD-10-CM

## 2024-03-23 ENCOUNTER — Encounter: Payer: Self-pay | Admitting: Radiology

## 2024-03-24 ENCOUNTER — Ambulatory Visit (HOSPITAL_BASED_OUTPATIENT_CLINIC_OR_DEPARTMENT_OTHER)
Admission: RE | Admit: 2024-03-24 | Discharge: 2024-03-24 | Disposition: A | Discharge status: Home / Self Care | Source: Ambulatory Visit | Attending: Hematology & Oncology | Admitting: Hematology & Oncology

## 2024-03-24 ENCOUNTER — Encounter (HOSPITAL_BASED_OUTPATIENT_CLINIC_OR_DEPARTMENT_OTHER): Payer: Self-pay

## 2024-03-24 DIAGNOSIS — J90 Pleural effusion, not elsewhere classified: Secondary | ICD-10-CM | POA: Insufficient documentation

## 2024-03-24 DIAGNOSIS — D696 Thrombocytopenia, unspecified: Secondary | ICD-10-CM | POA: Diagnosis not present

## 2024-03-24 DIAGNOSIS — Q631 Lobulated, fused and horseshoe kidney: Secondary | ICD-10-CM | POA: Diagnosis not present

## 2024-03-24 DIAGNOSIS — J9811 Atelectasis: Secondary | ICD-10-CM | POA: Insufficient documentation

## 2024-03-24 MED ORDER — IOHEXOL 300 MG/ML  SOLN
100.0000 mL | Freq: Once | INTRAMUSCULAR | Status: AC | PRN
  Administered 2024-03-24: 100 mL via INTRAVENOUS

## 2024-03-25 NOTE — Therapy (Signed)
 OUTPATIENT PHYSICAL THERAPY THORACOLUMBAR TREATMENT   Patient Name: [name] MRN: [id number] DOB:04/26/1956, 68 y.o., female Today's Date: 03/26/2024    END OF SESSION:  PT End of Session - 03/26/24 1007     Visit Number 9    Number of Visits 23    Date for Recertification  05/05/24    Authorization Type BC[employer] MEDICARE $20 COPAY    Progress Note Due on Visit 10    PT Start Time 1015    PT Stop Time 1057    PT Time Calculation (min) 42 min    Activity Tolerance Patient tolerated treatment well    Behavior During Therapy [facility] for tasks assessed/performed           Past Medical History:  Diagnosis Date   B12 DEFICIENCY 02/09/2009   Qualifier: Diagnosis of  By: Joshua RN, CGRN, Sheri     Blood in stool    COLONIC POLYPS, HYPERPLASTIC, HX OF 02/03/2009   Qualifier: Diagnosis of  By: Surface RN, Arland MENDS, COLON 02/03/2009   Qualifier: Diagnosis of  By: Surface RN, Donna     Dyslipidemia 06/20/2012   Fracture, intertrochanteric, left femur (HCC) 05/02/2012   GERD (gastroesophageal reflux disease)    Hearing loss    Bil/has hearing aids   Hemorrhoids    HEMORRHOIDS 02/03/2009   Qualifier: Diagnosis of  By: Surface RN, Arland     Hypertension    INFLAMMATORY BOWEL DISEASE - Followed by Dr. Jakie in GI 02/03/2009   Qualifier: Diagnosis of  By: Surface RN, Arland Bristle    Turner's syndrome    was on Provera and Premarin and d/c this  at age 15yr   Ulcerative colitis    Past Surgical History:  Procedure Laterality Date   COLONOSCOPY  last 03/25/2013   HERNIA REPAIR  2009   lt ing    INCISION AND DRAINAGE  2004    lt thumb dog bite   INTRAMEDULLARY (IM) NAIL INTERTROCHANTERIC  05/02/2012   Procedure: INTRAMEDULLARY (IM) NAIL INTERTROCHANTRIC;  Surgeon: Fonda SHAUNNA Olmsted, MD;  Location: [facility] OR;  Service: Orthopedics;  Laterality: Left;   ORIF FINGER FRACTURE  06/14/2011   Procedure: OPEN REDUCTION INTERNAL FIXATION (ORIF) METACARPAL (FINGER) FRACTURE;   Surgeon: Franky JONELLE Curia, MD;  Location: [facility];  Service: Orthopedics;  Laterality: Right;  right ring   POLYPECTOMY     TONSILLECTOMY     UPPER GASTROINTESTINAL ENDOSCOPY     Patient Active Problem List   Diagnosis Date Noted   Hyponatremia 10/19/2023   Thrombocytopenia 10/19/2023   Acute leg pain, left 07/19/2022   Osteopenia/?Osteoporosis - followed by her gynecologist, Dr. Kandyce 06/20/2012   Dyslipidemia 06/20/2012   H/O Turner syndrome 05/02/2012   Essential hypertension 02/03/2009   INFLAMMATORY BOWEL DISEASE - Followed by GI 02/03/2009   History of colonic polyps 02/03/2009    PCP: Heron CHRISTELLA Sharper, MD   REFERRING PROVIDER: [name], MD  REFERRING DIAG: M54.50 (ICD-10-CM) - Lumbar pain  Rationale for Evaluation and Treatment: Rehabilitation  THERAPY DIAG:  Other low back pain  Muscle weakness (generalized)  Unsteadiness on feet  Pain in left hip  Stiffness of left hip, not elsewhere classified  Other abnormalities of gait and mobility  ONSET DATE: 4 years ago when it started; 2 years it has gotten worse   SUBJECTIVE:  SUBJECTIVE STATEMENT: Patient endorses having increased soreness and discomfort, but potentially could be from increased activity over last couple days and different shoe wear.   PERTINENT HISTORY:  Patient states she experienced a femur fracture in 2013 from a fall at work, but has not had any other injuries or surgeries to the area. Patient received a cortisone shot for Left hip pain then went to ER 3 days as she started having slurred speech, balance issues, and ambulation deficits. After patient was released, she went to Dr. Burnetta and received a prednisone  taper pack which assisted her pain, but she had withdrawal-like symptoms after  completing. Patient also noted she is having platelet issues and is seeing a hematologist.  PAIN:  Are you having pain? Yes: NPRS scale: 6/10  Pain location: left lower back and down left LE  Pain description: burning Aggravating factors: walking Relieving factors: sitting/resting  PRECAUTIONS: None  RED FLAGS: Noting some bladder incontinence, but being followed by OB/GYN for that    WEIGHT BEARING RESTRICTIONS: No  FALLS:  Has patient fallen in last 6 months? 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  DIAGNOSTIC FINDINGS:  XRAY: showing disc at loss at L4/5 and L5/S1.  No evidence of  instability on flexion/extension views.  No fracture or dislocation seen.   MRI IMPRESSION: 1. Right eccentric disc bulge and facet arthropathy at L4-L5 resulting in moderate right neural foraminal narrowing. 2. Disc bulge at L5-S1 with lateral disc contacting the exited left L5 nerve root in the extraforaminal zone.  PATIENT SURVEYS:  PSFS: THE PATIENT SPECIFIC FUNCTIONAL SCALE  Place score of 0-10 (0 = unable to perform activity and 10 = able to perform activity at the same level as before injury or problem)  Activity Date: 01/07/2024 02/10/2024 03/10/2024   Balance   1 5 7   2. Walking   3 6 6   3.     4.      Total Score 2 5.5 6.5    Total Score = Sum of activity scores/number of activities  Minimally Detectable Change: 3 points (for single activity); 2 points (for average score)  [facility]  (nd). The Patient Specific Functional Scale . Retrieved from [email or web address]   COGNITION: Overall cognitive status: Within functional limits for tasks assessed     SENSATION: Light touch: WFL  MUSCLE LENGTH: Not assessed on eval  POSTURE: rounded shoulders, forward head, and increased thoracic kyphosis  PALPATION: TTP 2/10 at Left upper/lateral glute  LUMBAR ROM:   AROM Eval 01/07/2024 02/10/2024  Flexion WFL   Extension 50%   Right lateral flexion 50-75%   Left lateral flexion 50-75%   Right rotation 50%   Left rotation 50%     (Blank rows = not tested)  LOWER EXTREMITY ROM:     ROM Right Eval 01/07/2024 Left Eval 01/07/2024  Hip flexion [facility]   Hip extension    Hip abduction    Hip adduction    Hip internal rotation    Hip external rotation    Knee flexion    Knee  extension    Ankle dorsiflexion    Ankle plantarflexion    Ankle inversion    Ankle eversion     (Blank rows = not tested)  LOWER EXTREMITY MMT:    MMT Right Eval 01/07/2024 Left Eval 01/07/2024 03/10/2024  Hip flexion (seated) 5/5 4-/5   Hip extension (prone) 3-/5 3-/5   Hip abduction (sidelying) 3-/5 3-/5 Rt: 3+/5 Lt: 3+/5  Hip adduction     Hip internal rotation     Hip external rotation     Knee flexion (seated) 5/5 4-/5   Knee extension (seated) 5/5 4/5   Ankle dorsiflexion     Ankle plantarflexion     Ankle inversion     Ankle eversion      (Blank rows = not tested)  LUMBAR SPECIAL TESTS:  Straight leg raise test: Negative, Slump test: Negative, and FABER test: Positive on L, but due to past hip fracture Clonus: negative   FUNCTIONAL TESTS:  5 times sit to stand: 10.27s  performed with SBA 4 stance balance: normal and narrow 10s, unable to complete tandem, 4s Rt 5s Lt single leg with lateral LOB and appropriate stepping reactions ; performed with CGA   03/10/2024 5xSTS: 11.22s performed with SBA  4 stance balance:  normal: 10s narrow: 10s tandem Rt: 2s tandem LT: 3s Rt LE: 2s Lt LE: 2s   GAIT: Distance walked: not formally assessed  Assistive device utilized: None Level of assistance: SBA Comments: increased IR throughout gait with Left LE, antalgic gait pattern, Left lateral lean with Left stance  TREATMENT DATE:  03/26/2024 TherEx:  UBE with bilat LE level 2 for 7 minutes  Lateral step ups with 4 step and bilat UE use, 2x10 each side  Suitcase carry 1x115' in Lt hand then 1x90' in Rt hand  Unable to complete same distance secondary to fatigue and decrease in balance  Seated marches 1x20 marches, 2x20 with 2# ankle weights   TherAct:  Patient performed ascending and descending of lobby stairs with CGA and using reciprocal pattern and 1 handrail  Descending of stairs with no difficulty or pain noted  Ascending stairs with increase Rt IR, posterior lean, and heavy UE use on handrails to pull fwd  Bilat leg press 2x12 with 56# Unilat leg press 2x10 with 25# each side   03/20/2024 TherEx:  UBE with bilat LE level 2 for 6 minutes  TRX squats 2x10  TRX lateral lunges using slider under contralat LE 2x8 each side  Paloff press 2x10 each side with green TB   Neuro Re-Ed:  Semitandem stance 3x20s per side  Step taps to 2 step with no UE use, 2x20 taps  Step taps to 4 step with no UE use, 2x20s taps  03/10/2024 TherEx:  UBE with bilat LE level 1.5 for 8 minutes   Supine bridges with green TB 3x10  Supine marches with TA contraction 2x20, first set with no weight, second set with 1# ankle weights  Sit<>stands 2x12 with 3# DB  Calf raises into toe raises 2x10 with UE support   Physical Performance:  5xSTS  4 stance balance assessment  Discussed results of both above assessments with patient   02/25/2024 TherEx:  Nustep with bilat LE level 4 for 4 minutes ; highly fatiguing for legs  Sit<>stands with 3# DB 2x10 ; slight increase in pain in Lt hip  Standing abduction with yellow TB  around knees 2x10 each side   Neuro Re-Ed:  Semi-tandem stance with CGA, increasing challenge  by closing gap between feet after each set; 3x30s each LE back Intermittent fingertip use on mat table due to LOB ; improved performance each set  Narrow stance on foam with CGA, 3x30s ; improved performance with each set  Step taps to 4 step with reciprocal pattern 2x20 taps  ; Lt UE use for balance                                                                     PATIENT EDUCATION:  Education details: HEP, POC  Person educated: Patient Education method: Explanation, Demonstration, Tactile cues, Verbal cues, and Handouts Education comprehension: verbalized understanding, returned demonstration, verbal cues required, and tactile cues required  HOME EXERCISE PROGRAM: Access Code: [email or web address] URL: [email or web address] Date: 01/31/2024 Prepared by: Susannah Daring  Exercises - Standing Hip Abduction with Counter Support  - 1 x daily - 7 x weekly - 2 sets - 10 reps - Standing Hip Extension with Counter Support  - 1 x daily - 7 x weekly - 2 sets - 10 reps - Seated Active Straight-Leg Raise  - 1 x daily - 7 x weekly - 2 sets - 10 reps - Heel Raises with Counter Support  - 1 x daily - 7 x weekly - 2 sets - 10 reps - Mini Squat with Counter Support  - 1 x daily - 7 x weekly - 2 sets - 10 reps - Standing Lumbar Extension at Wall - Forearms  - 5 x daily - 7 x weekly - 1 sets - 5 reps - 3 seconds hold - Standing Hip Hiking  - 5 x daily - 7 x weekly - 1 sets - 5 reps - 3 seconds hold - Prone Hip Extension  - 2 x daily - 7 x weekly - 1-2 sets - 10 reps - 3 seconds hold - Romberg Stance with Head Rotation  - 1 x daily - 7 x weekly - 2 sets - 10 reps - Romberg Stance with Eyes Closed  - 1 x daily - 7 x weekly - 2 sets - 10 reps - Step Up  - 1 x daily - 7 x weekly - 2 sets - 10 reps - Standing Forward Step Taps with Counter Support  - 1 x daily - 7 x weekly - 2 sets - 10  reps  ASSESSMENT:  CLINICAL IMPRESSION: Patient arrived to session endorsing increased soreness that improved with UBE bilat LE use. Patient tolerated all activities with typical fatigue leading to decreased balance, specifically with suitcase carries.  Patient will continue to benefit from skilled PT.     OBJECTIVE IMPAIRMENTS: Abnormal gait, decreased activity tolerance, decreased balance, decreased coordination, decreased endurance, decreased mobility, difficulty walking, decreased ROM, decreased strength, improper body mechanics, postural dysfunction, and pain.   ACTIVITY LIMITATIONS: carrying, lifting, and stairs  PARTICIPATION LIMITATIONS: meal prep, cleaning, laundry, community activity, and caretaker duties  PERSONAL FACTORS: Time since onset of injury/illness/exacerbation and 3+ comorbidities: GERD, HTN, ulcerative colitis, Turner's syndrome, osteopenia, dyslipidemia are also affecting patient's functional outcome.   REHAB POTENTIAL: Good  CLINICAL DECISION MAKING: Evolving/moderate complexity  EVALUATION COMPLEXITY: Moderate   GOALS: Goals reviewed with patient? Yes  SHORT TERM GOALS: Target date: 01/28/2024  Patient will show compliance with initial HEP. Baseline: Goal  status: Met 01/23/2024  2.  Patient will report pain levels no greater than 5/10 to show improved overall quality of life. Baseline:  Goal status: ongoing, 02/10/2024   LONG TERM GOALS: Target date: 05/05/2024  Patient will show independence with final HEP in order to maintain and progress upon functional gains made within PT. Baseline:  Goal status: Goal ongoing, 03/10/2024  2.  Patient will report pain levels no greater than 3/10 to show improved overall quality of life. Baseline:  Goal status: Goal ongoing, 03/10/2024  3.  Patient will increase PSFS to at least 4 to show significant improvement in subjective disability rating. Baseline:  Goal status: GOAL MET, 03/10/2024  4.  Patient will  increase hip abduction MMT to at least 4-/5 in order to show improved biomechanics with functional mobility and gait.  Baseline:  Goal status: Goal ongoing, 03/10/2024  5.  Patient will improve 4 stance balance in order to show increase in steadiness on feet.  Baseline:  Goal status: Goal ongoing, 03/10/2024  PLAN:  PT FREQUENCY: 1-2x/week  PT DURATION: 8 weeks  PLANNED INTERVENTIONS: [id number]- PT Re-evaluation, 97750- Physical Performance Testing, 97110-Therapeutic exercises, 97530- Therapeutic activity, [id number]- Neuromuscular re-education, 97535- Self Care, [id number]- Manual therapy, [phone]- Gait training, [phone]- Electrical stimulation (unattended), [phone]- Electrical stimulation (manual), [id number]- Vasopneumatic device, [id number]- Ultrasound, [id number]- Traction (mechanical), [id number]- Ionotophoresis 4mg /ml Dexamethasone , [id number] (1-2 muscles), [id number] (3+ muscles)- Dry Needling, Patient/Family education, Balance training, Stair training, Taping, Joint mobilization, Joint manipulation, Spinal manipulation, Spinal mobilization, Scar mobilization, Vestibular training, DME instructions, Cryotherapy, and Moist heat.  PLAN FOR NEXT SESSION:    stepping over hurdles, generalized LE strengthening, gait, balance   Susannah Daring, PT, DPT 03/26/24 12:40 PM

## 2024-03-26 ENCOUNTER — Ambulatory Visit

## 2024-03-26 DIAGNOSIS — M25552 Pain in left hip: Secondary | ICD-10-CM | POA: Diagnosis not present

## 2024-03-26 DIAGNOSIS — M6281 Muscle weakness (generalized): Secondary | ICD-10-CM | POA: Diagnosis not present

## 2024-03-26 DIAGNOSIS — M5459 Other low back pain: Secondary | ICD-10-CM

## 2024-03-26 DIAGNOSIS — R2681 Unsteadiness on feet: Secondary | ICD-10-CM | POA: Diagnosis not present

## 2024-03-26 DIAGNOSIS — R2689 Other abnormalities of gait and mobility: Secondary | ICD-10-CM

## 2024-03-26 DIAGNOSIS — M25652 Stiffness of left hip, not elsewhere classified: Secondary | ICD-10-CM

## 2024-03-30 ENCOUNTER — Telehealth: Payer: Self-pay | Admitting: Oncology

## 2024-03-30 NOTE — Telephone Encounter (Signed)
 PT wants to cancel, wants to call back and reschedule.

## 2024-03-31 ENCOUNTER — Inpatient Hospital Stay

## 2024-03-31 ENCOUNTER — Inpatient Hospital Stay: Admitting: Oncology

## 2024-03-31 LAB — MISC LABCORP TEST (SEND OUT): Labcorp test code: 452312

## 2024-04-01 ENCOUNTER — Other Ambulatory Visit: Payer: Self-pay | Admitting: Family Medicine

## 2024-04-01 DIAGNOSIS — G4700 Insomnia, unspecified: Secondary | ICD-10-CM

## 2024-04-02 ENCOUNTER — Ambulatory Visit: Admitting: Gastroenterology

## 2024-04-02 ENCOUNTER — Other Ambulatory Visit: Payer: Self-pay | Admitting: Hematology & Oncology

## 2024-04-02 NOTE — Therapy (Signed)
 OUTPATIENT PHYSICAL THERAPY THORACOLUMBAR TREATMENT / PROGRESS NOTE   Patient Name: [name] MRN: [id number] DOB:03-Jan-1956, 68 y.o., female Today's Date: 04/03/2024  Progress Note Reporting Period 03/10/2024 to 04/03/2024  See note below for Objective Data and Assessment of Progress/Goals.      END OF SESSION:  PT End of Session - 04/03/24 0931     Visit Number 10    Number of Visits 23    Date for Recertification  05/05/24    Authorization Type BCBS MEDICARE $20 COPAY    Progress Note Due on Visit 20    PT Start Time 0930    PT Stop Time 1015    PT Time Calculation (min) 45 min    Activity Tolerance Patient tolerated treatment well    Behavior During Therapy [facility] for tasks assessed/performed          Past Medical History:  Diagnosis Date   B12 DEFICIENCY 02/09/2009   Qualifier: Diagnosis of  By: Joshua RN, CGRN, Sheri     Blood in stool    COLONIC POLYPS, HYPERPLASTIC, HX OF 02/03/2009   Qualifier: Diagnosis of  By: Surface RN, Arland MENDS, COLON 02/03/2009   Qualifier: Diagnosis of  By: Surface RN, Donna     Dyslipidemia 06/20/2012   Fracture, intertrochanteric, left femur (HCC) 05/02/2012   GERD (gastroesophageal reflux disease)    Hearing loss    Bil/has hearing aids   Hemorrhoids    HEMORRHOIDS 02/03/2009   Qualifier: Diagnosis of  By: Surface RN, Arland     Hypertension    INFLAMMATORY BOWEL DISEASE - Followed by Dr. Jakie in GI 02/03/2009   Qualifier: Diagnosis of  By: Surface RN, Arland Bristle    Turner's syndrome    was on Provera and Premarin and d/c this  at age 56yr   Ulcerative colitis    Past Surgical History:  Procedure Laterality Date   COLONOSCOPY  last 03/25/2013   HERNIA REPAIR  2009   lt ing    INCISION AND DRAINAGE  2004    lt thumb dog bite   INTRAMEDULLARY (IM) NAIL INTERTROCHANTERIC  05/02/2012   Procedure: INTRAMEDULLARY (IM) NAIL INTERTROCHANTRIC;  Surgeon: Fonda SHAUNNA Olmsted, MD;  Location: [facility] OR;  Service:  Orthopedics;  Laterality: Left;   ORIF FINGER FRACTURE  06/14/2011   Procedure: OPEN REDUCTION INTERNAL FIXATION (ORIF) METACARPAL (FINGER) FRACTURE;  Surgeon: Franky JONELLE Curia, MD;  Location: [facility];  Service: Orthopedics;  Laterality: Right;  right ring   POLYPECTOMY     TONSILLECTOMY     UPPER GASTROINTESTINAL ENDOSCOPY     Patient Active Problem List   Diagnosis Date Noted   Hyponatremia 10/19/2023   Thrombocytopenia 10/19/2023   Acute leg pain, left 07/19/2022   Osteopenia/?Osteoporosis - followed by her gynecologist, Dr. Kandyce 06/20/2012   Dyslipidemia 06/20/2012   H/O Turner syndrome 05/02/2012   Essential hypertension 02/03/2009   INFLAMMATORY BOWEL DISEASE - Followed by GI 02/03/2009   History of colonic polyps 02/03/2009    PCP: Heron CHRISTELLA Sharper, MD   REFERRING PROVIDER: [name], MD  REFERRING DIAG: M54.50 (ICD-10-CM) - Lumbar pain  Rationale for Evaluation and Treatment: Rehabilitation  THERAPY DIAG:  Other low back pain  Muscle weakness (generalized)  Unsteadiness on feet  Pain in left hip  Stiffness of left hip, not elsewhere classified  Other abnormalities of gait and mobility  ONSET DATE: 4 years ago when it started; 2 years it has gotten  worse   SUBJECTIVE:                                                                                                                                                                                           SUBJECTIVE STATEMENT: Patient reports having to deal with other medical issues occurring concurrently with PT.   PERTINENT HISTORY:  Patient states she experienced a femur fracture in 2013 from a fall at work, but has not had any other injuries or surgeries to the area. Patient received a cortisone shot for Left hip pain then went to ER 3 days as she started having slurred speech, balance issues, and ambulation deficits. After patient was released, she went to Dr. Burnetta and received a  prednisone  taper pack which assisted her pain, but she had withdrawal-like symptoms after completing. Patient also noted she is having platelet issues and is seeing a hematologist.  PAIN:  Are you having pain? Yes: NPRS scale: 5/10 with ambulation, 2/10 on the best day  Pain location: left lower back and down left LE  Pain description: burning Aggravating factors: walking Relieving factors: sitting/resting  PRECAUTIONS: None  RED FLAGS: Noting some bladder incontinence, but being followed by OB/GYN for that    WEIGHT BEARING RESTRICTIONS: No  FALLS:  Has patient fallen in last 6 months? 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  DIAGNOSTIC FINDINGS:  XRAY: showing disc at loss at L4/5 and L5/S1.  No evidence of  instability on flexion/extension views.  No fracture or dislocation seen.   MRI IMPRESSION: 1. Right eccentric disc bulge and facet arthropathy at L4-L5 resulting in moderate right neural foraminal narrowing. 2. Disc bulge at L5-S1 with lateral disc contacting the exited left L5 nerve root in the extraforaminal zone.  PATIENT SURVEYS:  PSFS: THE PATIENT SPECIFIC FUNCTIONAL SCALE  Place score of 0-10 (0 = unable to perform activity and 10 = able to perform activity at the same level as before injury or problem)  Activity Date: 01/07/2024 02/10/2024 03/10/2024   Balance   1 5 7   2. Walking   3 6 6   3.     4.      Total Score 2 5.5 6.5  Total Score = Sum of activity scores/number of  activities  Minimally Detectable Change: 3 points (for single activity); 2 points (for average score)  [facility] (nd). The Patient Specific Functional Scale . Retrieved from [email or web address].com.pt   COGNITION: Overall cognitive status: Within functional limits for tasks assessed     SENSATION: Light touch: WFL  MUSCLE LENGTH: Not assessed on eval  POSTURE: rounded shoulders, forward head, and increased thoracic kyphosis  PALPATION: TTP 2/10 at Left upper/lateral glute  LUMBAR ROM:   AROM Eval 01/07/2024 02/10/2024  Flexion WFL   Extension 50%   Right lateral flexion 50-75%   Left lateral flexion 50-75%   Right rotation 50%   Left rotation 50%     (Blank rows = not tested)  LOWER EXTREMITY ROM:     ROM Right Eval 01/07/2024 Left Eval 01/07/2024  Hip flexion [facility]   Hip extension    Hip abduction    Hip adduction    Hip internal rotation    Hip external rotation    Knee flexion    Knee extension    Ankle dorsiflexion    Ankle plantarflexion    Ankle inversion    Ankle eversion     (Blank rows = not tested)  LOWER EXTREMITY MMT:    MMT Right Eval 01/07/2024 Left Eval 01/07/2024 03/10/2024 04/03/2024  Hip flexion (seated) 5/5 4-/5    Hip extension (prone) 3-/5 3-/5    Hip abduction (sidelying) 3-/5 3-/5 Rt: 3+/5 Lt: 3+/5 Lt: 2+/5  Hip adduction      Hip internal rotation      Hip external rotation      Knee flexion (seated) 5/5 4-/5    Knee extension (seated) 5/5 4/5    Ankle dorsiflexion      Ankle plantarflexion      Ankle inversion      Ankle eversion       (Blank rows = not tested)  LUMBAR SPECIAL TESTS:  Straight leg raise test: Negative, Slump test: Negative, and FABER test: Positive on L, but due to past hip fracture Clonus: negative   FUNCTIONAL TESTS:  5 times sit to stand: 10.27s  performed with SBA 4 stance balance: normal and narrow 10s, unable to complete tandem, 4s Rt  5s Lt single leg with lateral LOB and appropriate stepping reactions ; performed with CGA   03/10/2024 5xSTS: 11.22s performed with SBA  4 stance balance: normal: 10s narrow: 10s tandem Rt: 2s tandem LT: 3s Rt LE: 2s Lt LE: 2s   GAIT: Distance walked: not formally assessed  Assistive device utilized: None Level of assistance: SBA Comments: increased IR throughout gait with Left LE, antalgic gait pattern, Left lateral lean with Left stance  TREATMENT DATE:  04/03/2024 TherEx:  UBE level 2 for 8 minutes  Touch down squats to 18 arm chair with 5# 2x8  PT assessed Lt hip abduction against gravity, ambulation, and palpated Lt hip musculature  PT observed trendelemburg with Lt stance, lateral lean with Lt stance PT found patient to have trigger points in Lt glute med, glute max, glute min, and piriformis   Updated MMT for hip abduction noted above  PT found that back mobility did not change pain in Lt hip  In depth discussion on dry needling (handout provided with note on top about extra charge), massage/percussive device, STM with tennis ball, POC and next steps towards independent HEP, how prior pain/surgeries can lead to long term changes in muscle use with certain activities  03/26/2024 TherEx:  UBE with bilat LE level 2 for 7 minutes  Lateral step ups with 4 step and bilat UE use, 2x10 each side  Suitcase carry 1x115' in Lt hand then 1x90' in Rt hand  Unable to complete same distance secondary to fatigue and decrease in balance  Seated marches 1x20 marches, 2x20 with 2# ankle weights   TherAct:  Patient performed ascending and descending of lobby stairs with CGA and using reciprocal pattern and 1 handrail  Descending of stairs with no difficulty or pain noted  Ascending stairs with increase Rt IR, posterior lean, and heavy UE use on handrails to pull fwd  Bilat leg press 2x12 with 56# Unilat leg press 2x10 with 25# each side   03/20/2024 TherEx:  UBE with bilat LE level 2 for  6 minutes  TRX squats 2x10  TRX lateral lunges using slider under contralat LE 2x8 each side  Paloff press 2x10 each side with green TB   Neuro Re-Ed:  Semitandem stance 3x20s per side  Step taps to 2 step with no UE use, 2x20 taps  Step taps to 4 step with no UE use, 2x20s taps  03/10/2024 TherEx:  UBE with bilat LE level 1.5 for 8 minutes   Supine bridges with green TB 3x10  Supine marches with TA contraction 2x20, first set with no weight, second set with 1# ankle weights  Sit<>stands 2x12 with 3# DB  Calf raises into toe raises 2x10 with UE support   Physical Performance:  5xSTS  4 stance balance assessment  Discussed results of both above assessments with patient                                                                PATIENT EDUCATION:  Education details: HEP, POC  Person educated: Patient Education method: Explanation, Demonstration, Tactile cues, Verbal cues, and Handouts Education comprehension: verbalized understanding, returned demonstration, verbal cues required, and tactile cues required  HOME EXERCISE PROGRAM: Access Code: [email or web address] URL: [email or web address] Date: 01/31/2024 Prepared by: Susannah Daring  Exercises - Standing Hip Abduction with Counter Support  - 1 x daily - 7 x weekly - 2 sets - 10 reps - Standing Hip Extension with Counter Support  - 1 x daily - 7 x weekly - 2 sets - 10 reps - Seated Active Straight-Leg Raise  - 1 x daily - 7 x weekly - 2 sets - 10 reps - Heel Raises with Counter Support  - 1 x daily - 7 x weekly - 2 sets - 10 reps - Mini Squat with Counter Support  - 1 x daily - 7 x weekly - 2 sets - 10 reps - Standing Lumbar Extension at Wall - Forearms  - 5 x daily - 7 x weekly - 1 sets - 5 reps - 3 seconds hold - Standing Hip Hiking  - 5 x daily - 7 x weekly - 1 sets - 5 reps - 3 seconds hold - Prone Hip Extension  - 2 x daily - 7 x weekly - 1-2 sets - 10 reps - 3 seconds hold - Romberg Stance with Head Rotation  - 1  x daily - 7 x weekly - 2 sets - 10 reps - Romberg Stance with Eyes Closed  - 1 x daily -  7 x weekly - 2 sets - 10 reps - Step Up  - 1 x daily - 7 x weekly - 2 sets - 10 reps - Standing Forward Step Taps with Counter Support  - 1 x daily - 7 x weekly - 2 sets - 10 reps  ASSESSMENT:  CLINICAL IMPRESSION: Patient arrived to session noting no change in symptoms. Patient has made progress in some areas of care, though has reached a plateau in some areas secondary to pain levels in Lt hip especially with ambulating. PT and patient discussed many topics, to include POC and next steps forward. Patient will continue to benefit from skilled PT.   OBJECTIVE IMPAIRMENTS: Abnormal gait, decreased activity tolerance, decreased balance, decreased coordination, decreased endurance, decreased mobility, difficulty walking, decreased ROM, decreased strength, improper body mechanics, postural dysfunction, and pain.   ACTIVITY LIMITATIONS: carrying, lifting, and stairs  PARTICIPATION LIMITATIONS: meal prep, cleaning, laundry, community activity, and caretaker duties  PERSONAL FACTORS: Time since onset of injury/illness/exacerbation and 3+ comorbidities: GERD, HTN, ulcerative colitis, Turner's syndrome, osteopenia, dyslipidemia are also affecting patient's functional outcome.   REHAB POTENTIAL: Good  CLINICAL DECISION MAKING: Evolving/moderate complexity  EVALUATION COMPLEXITY: Moderate   GOALS: Goals reviewed with patient? Yes  SHORT TERM GOALS: Target date: 01/28/2024  Patient will show compliance with initial HEP. Baseline: Goal status: Met 01/23/2024  2.  Patient will report pain levels no greater than 5/10 to show improved overall quality of life. Baseline:  Goal status: ongoing, 04/03/2024   LONG TERM GOALS: Target date: 05/05/2024  Patient will show independence with final HEP in order to maintain and progress upon functional gains made within PT. Baseline:  Goal status: ongoing,  04/03/2024  2.  Patient will report pain levels no greater than 3/10 to show improved overall quality of life. Baseline:  Goal status: ongoing, 04/03/2024  3.  Patient will increase PSFS to at least 4 to show significant improvement in subjective disability rating. Baseline:  Goal status: GOAL MET, 03/10/2024  4.  Patient will increase hip abduction MMT to at least 4-/5 in order to show improved biomechanics with functional mobility and gait.  Baseline:  Goal status: ongoing, 04/03/2024  5.  Patient will improve 4 stance balance in order to show increase in steadiness on feet.  Baseline:  Goal status: ongoing, 04/03/2024  PLAN:  PT FREQUENCY: 1-2x/week  PT DURATION: 8 weeks  PLANNED INTERVENTIONS: 97164- PT Re-evaluation, 97750- Physical Performance Testing, 97110-Therapeutic exercises, 97530- Therapeutic activity, [id number]- Neuromuscular re-education, 97535- Self Care, [id number]- Manual therapy, [phone]- Gait training, [phone]- Electrical stimulation (unattended), [phone]- Electrical stimulation (manual), 97016- Vasopneumatic device, [id number]- Ultrasound, [id number]- Traction (mechanical), [id number]- Ionotophoresis 4mg /ml Dexamethasone , 79439 (1-2 muscles), [id number] (3+ muscles)- Dry Needling, Patient/Family education, Balance training, Stair training, Taping, Joint mobilization, Joint manipulation, Spinal manipulation, Spinal mobilization, Scar mobilization, Vestibular training, DME instructions, Cryotherapy, and Moist heat.  PLAN FOR NEXT SESSION:     stepping over hurdles, generalized LE strengthening, gait, balance, manual techniques   Susannah Daring, PT, DPT 04/03/24 2:24 PM

## 2024-04-03 ENCOUNTER — Ambulatory Visit

## 2024-04-03 DIAGNOSIS — M6281 Muscle weakness (generalized): Secondary | ICD-10-CM

## 2024-04-03 DIAGNOSIS — R2681 Unsteadiness on feet: Secondary | ICD-10-CM

## 2024-04-03 DIAGNOSIS — M25552 Pain in left hip: Secondary | ICD-10-CM | POA: Diagnosis not present

## 2024-04-03 DIAGNOSIS — M25652 Stiffness of left hip, not elsewhere classified: Secondary | ICD-10-CM

## 2024-04-03 DIAGNOSIS — R2689 Other abnormalities of gait and mobility: Secondary | ICD-10-CM

## 2024-04-03 DIAGNOSIS — M5459 Other low back pain: Secondary | ICD-10-CM | POA: Diagnosis not present

## 2024-04-06 ENCOUNTER — Other Ambulatory Visit: Payer: Self-pay | Admitting: *Deleted

## 2024-04-06 DIAGNOSIS — D696 Thrombocytopenia, unspecified: Secondary | ICD-10-CM

## 2024-04-07 ENCOUNTER — Inpatient Hospital Stay: Admitting: Hematology & Oncology

## 2024-04-07 ENCOUNTER — Other Ambulatory Visit: Payer: Self-pay

## 2024-04-07 ENCOUNTER — Inpatient Hospital Stay: Attending: Nurse Practitioner

## 2024-04-07 ENCOUNTER — Encounter: Payer: Self-pay | Admitting: Hematology & Oncology

## 2024-04-07 ENCOUNTER — Encounter

## 2024-04-07 ENCOUNTER — Inpatient Hospital Stay

## 2024-04-07 VITALS — BP 143/69 | HR 87 | Temp 98.4°F | Resp 16 | Ht <= 58 in | Wt 154.0 lb

## 2024-04-07 DIAGNOSIS — D696 Thrombocytopenia, unspecified: Secondary | ICD-10-CM | POA: Diagnosis not present

## 2024-04-07 DIAGNOSIS — Q969 Turner's syndrome, unspecified: Secondary | ICD-10-CM | POA: Insufficient documentation

## 2024-04-07 DIAGNOSIS — J90 Pleural effusion, not elsewhere classified: Secondary | ICD-10-CM | POA: Insufficient documentation

## 2024-04-07 LAB — CBC WITH DIFFERENTIAL (CANCER CENTER ONLY)
Abs Immature Granulocytes: 0.21 K/uL — ABNORMAL HIGH (ref 0.00–0.07)
Basophils Absolute: 0 K/uL (ref 0.0–0.1)
Basophils Relative: 0 %
Eosinophils Absolute: 0.1 K/uL (ref 0.0–0.5)
Eosinophils Relative: 1 %
HCT: 41 % (ref 36.0–46.0)
Hemoglobin: 12.7 g/dL (ref 12.0–15.0)
Immature Granulocytes: 2 %
Lymphocytes Relative: 22 %
Lymphs Abs: 2.5 K/uL (ref 0.7–4.0)
MCH: 27.1 pg (ref 26.0–34.0)
MCHC: 31 g/dL (ref 30.0–36.0)
MCV: 87.4 fL (ref 80.0–100.0)
Monocytes Absolute: 3 K/uL — ABNORMAL HIGH (ref 0.1–1.0)
Monocytes Relative: 27 %
Neutro Abs: 5.5 K/uL (ref 1.7–7.7)
Neutrophils Relative %: 48 %
Platelet Count: 61 K/uL — ABNORMAL LOW (ref 150–400)
RBC: 4.69 MIL/uL (ref 3.87–5.11)
RDW: 15.9 % — ABNORMAL HIGH (ref 11.5–15.5)
Smear Review: NORMAL
WBC Count: 11.4 K/uL — ABNORMAL HIGH (ref 4.0–10.5)
nRBC: 0 % (ref 0.0–0.2)

## 2024-04-07 LAB — LACTATE DEHYDROGENASE: LDH: 187 U/L (ref 105–235)

## 2024-04-07 LAB — CMP (CANCER CENTER ONLY)
ALT: 21 U/L (ref 0–44)
AST: 25 U/L (ref 15–41)
Albumin: 4.6 g/dL (ref 3.5–5.0)
Alkaline Phosphatase: 73 U/L (ref 38–126)
Anion gap: 12 (ref 5–15)
BUN: 16 mg/dL (ref 8–23)
CO2: 25 mmol/L (ref 22–32)
Calcium: 9.5 mg/dL (ref 8.9–10.3)
Chloride: 106 mmol/L (ref 98–111)
Creatinine: 0.93 mg/dL (ref 0.44–1.00)
GFR, Estimated: >60 mL/min (ref >60)
Glucose, Bld: 101 mg/dL — ABNORMAL HIGH (ref 70–99)
Potassium: 4.6 mmol/L (ref 3.5–5.1)
Sodium: 143 mmol/L (ref 135–145)
Total Bilirubin: 0.7 mg/dL (ref 0.0–1.2)
Total Protein: 6.7 g/dL (ref 6.5–8.1)

## 2024-04-07 MED ORDER — ROMIPLOSTIM 125 MCG ~~LOC~~ SOLR
1.0000 ug/kg | Freq: Once | SUBCUTANEOUS | Status: AC
  Administered 2024-04-07: 70 ug via SUBCUTANEOUS
  Filled 2024-04-07: qty 0.14

## 2024-04-07 NOTE — Progress Notes (Signed)
 Hematology and Oncology Follow Up Visit  [name] [id number] 05/14/1956 68 y.o. 04/07/2024   Principle Diagnosis:  Thrombocytopenia-possible ITP versus MDS  Current Therapy:   Nplate-q. weekly to keep platelet count above 75,000     Interim History:  [name] is back for her second office visit.  We saw her on 03/16/2024.  At that time, we did order a CT scan.  The CT scan was done 03/24/2024.  The CT scan did show a right pleural effusion.  She is incredibly worried about this.  Unfortunate, with her platelet count being a little bit on the low side, I do not think it is safe to try to see if we can drain this.  There is no adenopathy.  There is no splenomegaly.  Her liver looked okay without any evidence of cirrhosis.  Of note, we did do NGS profile.  This did show that there were 3 abnormal genes.  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  Again, these could certainly indicate that she could be at risk for myelodysplasia.  Again she did have a past bone marrow biopsy which did not show any evidence of obvious myelodysplasia.  Again, she is very worried about her pleural effusion.  I told her that if we can get her platelet count up, then we could certainly think about a thoracentesis.  She has had no cough.  There is no shortness of breath.  She no chest pain.  She has had no nausea or vomiting.  There is been no bleeding or bruising.  She has had no change in bowel or bladder habits.  Overall, I would say that her performance status is probably ECOG 1.  Medications:  Current Outpatient Medications:    alendronate  (FOSAMAX ) 70 MG tablet, Take 70 mg by mouth once a week., Disp: , Rfl:    Calcium  Polycarbophil (FIBER-CAPS PO), Take 3 tablets by mouth daily at 6 (six) AM., Disp: , Rfl:    celecoxib  (CELEBREX ) 100 MG capsule, Take 100 mg by mouth as needed for mild pain (pain score 1-3) or moderate pain (pain score 4-6)., Disp: , Rfl:    Docusate Sodium  (COLACE  PO), Take 3 capsules by mouth daily at 6 (six) AM., Disp: , Rfl:    lisinopril  (ZESTRIL ) 40 MG tablet, TAKE 1 TABLET BY MOUTH EVERY DAY, Disp: 90 tablet, Rfl: 1   metoprolol  succinate (TOPROL -XL) 25 MG 24 hr tablet, Take 1 tablet (25 mg total) by mouth daily., Disp: 90 tablet, Rfl: 1   rosuvastatin  (CRESTOR ) 20 MG tablet, TAKE 1 TABLET BY MOUTH DAILY. GENERIC EQUIVALENT FOR CRESTOR ., Disp: 90 tablet, Rfl: 1   traZODone  (DESYREL ) 50 MG tablet, TAKE ONE-HALF TO ONE TABLET BY MOUTH AT BEDTIME AS NEED FOR SLEEP, Disp: 90 tablet, Rfl: 1   VITAMIN D  PO, Take 2,000 Units by mouth daily., Disp: , Rfl:   Allergies:  Allergies  Allergen Reactions   Prednisone  Nausea And Vomiting and Other (See Comments)     headache, dropping platelets    Past Medical History, Surgical history, Social history, and Family History were reviewed and updated.  Review of Systems: Review of Systems  Constitutional:  Positive for fatigue.  HENT:  Negative.    Eyes: Negative.   Respiratory: Negative.    Cardiovascular: Negative.   Gastrointestinal: Negative.   Endocrine: Negative.   Genitourinary: Negative.    Musculoskeletal: Negative.   Skin: Negative.   Neurological: Negative.   Hematological: Negative.  Psychiatric/Behavioral: Negative.      Physical Exam:  height is 4' 9 (1.448 m) and weight is 154 lb (69.9 kg). Her oral temperature is 98.4 F (36.9 C). Her blood pressure is 143/69 (abnormal) and her pulse is 87. Her respiration is 16 and oxygen saturation is 97%.   Wt Readings from Last 3 Encounters:  04/07/24 154 lb (69.9 kg)  03/16/24 156 lb (70.8 kg)  02/16/24 155 lb (70.3 kg)    Physical Exam Vitals reviewed.  HENT:     Head: Normocephalic and atraumatic.  Eyes:     Pupils: Pupils are equal, round, and reactive to light.  Cardiovascular:     Rate and Rhythm: Normal rate and regular rhythm.     Heart sounds: Normal heart sounds.  Pulmonary:     Effort: Pulmonary effort is normal.      Breath sounds: Normal breath sounds.  Abdominal:     General: Bowel sounds are normal.     Palpations: Abdomen is soft.  Musculoskeletal:        General: No tenderness or deformity. Normal range of motion.     Cervical back: Normal range of motion.  Lymphadenopathy:     Cervical: No cervical adenopathy.  Skin:    General: Skin is warm and dry.     Findings: No erythema or rash.  Neurological:     Mental Status: She is alert and oriented to person, place, and time.  Psychiatric:        Behavior: Behavior normal.        Thought Content: Thought content normal.        Judgment: Judgment normal.      Lab Results  Component Value Date   WBC 11.4 (H) 04/07/2024   HGB 12.7 04/07/2024   HCT 41.0 04/07/2024   MCV 87.4 04/07/2024   PLT 61 (L) 04/07/2024     Chemistry      Component Value Date/Time   NA 143 04/07/2024 0909   NA 137 11/29/2023 1545   K 4.6 04/07/2024 0909   CL 106 04/07/2024 0909   CO2 25 04/07/2024 0909   BUN 16 04/07/2024 0909   BUN 13 11/29/2023 1545   CREATININE 0.93 04/07/2024 0909      Component Value Date/Time   CALCIUM  9.5 04/07/2024 0909   ALKPHOS 73 04/07/2024 0909   AST 25 04/07/2024 0909   ALT 21 04/07/2024 0909   BILITOT 0.7 04/07/2024 0909     Normochromic normocytic population of red blood cells.  She has no nucleated red blood cells.  I see no teardrop cells.  She has no rouleaux formation.  There is no schistocytes or spherocytes.  White blood cells appear normal in morphology and maturation.  She may have a slight increase in neutrophils.  I do not see any immature myeloid or lymphoid cells.  Platelets are decreased in number.  She may have a rare large platelet.  Impression and Plan: [name] is a very nice 68 year old white female.  She has a history of Turner's syndrome.  She has thrombocytopenia.  Again, it is not clear as to the exact etiology.  We will see if Nplate can get her platelet count up.  If not, then we may have to think  about the fact that she could have myelodysplasia.  We also had to keep you on that she has this right pleural effusion.  Again, I am not sure if there is enough to be able to drain out.  I  think her platelet count is on the low side to do this right now.  I tried to reassure her that we will try to keep our focus on the platelets and on the pleural effusion.  She is not symptomatic with the pleural effusion which is encouraging right now.  We will give her the Nplate today.  She will then come back in another week for Nplate.  Again, once we get the platelet count of significantly, then we can see about a potential thoracentesis.  We will plan to see her back ourselves in another week.   [name], MD 11/18/20251:12 PM

## 2024-04-07 NOTE — Patient Instructions (Signed)
 Romiplostim Injection What is this medication? ROMIPLOSTIM (roe mi PLOE stim) treats low levels of platelets in your body caused by immune thrombocytopenia (ITP). It is prescribed when other medications have not worked or cannot be tolerated. It may also be used to help people who have been exposed to high doses of radiation. It works by increasing the amount of platelets in your blood. This lowers the risk of bleeding. This medicine may be used for other purposes; ask your health care provider or pharmacist if you have questions. COMMON BRAND NAME(S): Nplate What should I tell my care team before I take this medication? They need to know if you have any of these conditions: Blood clots Myelodysplastic syndrome An unusual or allergic reaction to romiplostim, mannitol, other medications, foods, dyes, or preservatives Pregnant or trying to get pregnant Breast-feeding How should I use this medication? This medication is injected under the skin. It is given by a care team in a hospital or clinic setting. A special MedGuide will be given to you before each treatment. Be sure to read this information carefully each time. Talk to your care team about the use of this medication in children. While it may be prescribed for children as young as newborns for selected conditions, precautions do apply. Overdosage: If you think you have taken too much of this medicine contact a poison control center or emergency room at once. NOTE: This medicine is only for you. Do not share this medicine with others. What if I miss a dose? Keep appointments for follow-up doses. It is important not to miss your dose. Call your care team if you are unable to keep an appointment. What may interact with this medication? Interactions are not expected. This list may not describe all possible interactions. Give your health care provider a list of all the medicines, herbs, non-prescription drugs, or dietary supplements you use. Also  tell them if you smoke, drink alcohol, or use illegal drugs. Some items may interact with your medicine. What should I watch for while using this medication? Visit your care team for regular checks on your progress. You may need blood work done while you are taking this medication. Your condition will be monitored carefully while you are receiving this medication. It is important not to miss any appointments. What side effects may I notice from receiving this medication? Side effects that you should report to your care team as soon as possible: Allergic reactions--skin rash, itching, hives, swelling of the face, lips, tongue, or throat Blood clot--pain, swelling, or warmth in the leg, shortness of breath, chest pain Side effects that usually do not require medical attention (report to your care team if they continue or are bothersome): Dizziness Joint pain Muscle pain Pain in the hands or feet Stomach pain Trouble sleeping This list may not describe all possible side effects. Call your doctor for medical advice about side effects. You may report side effects to FDA at 1-800-FDA-1088. Where should I keep my medication? This medication is given in a hospital or clinic. It will not be stored at home. NOTE: This sheet is a summary. It may not cover all possible information. If you have questions about this medicine, talk to your doctor, pharmacist, or health care provider.  2024 Elsevier/Gold Standard (2021-09-11 00:00:00)

## 2024-04-13 NOTE — Therapy (Signed)
 OUTPATIENT PHYSICAL THERAPY THORACOLUMBAR TREATMENT / PROGRESS NOTE   Patient Name: [name] MRN: [id number] DOB:December 19, 1955, 68 y.o., female Today's Date: 04/13/2024  Progress Note Reporting Period 03/10/2024 to 04/03/2024  See note below for Objective Data and Assessment of Progress/Goals.      END OF SESSION:    Past Medical History:  Diagnosis Date   B12 DEFICIENCY 02/09/2009   Qualifier: Diagnosis of  By: Joshua RN, CGRN, [name]     Blood in stool    COLONIC POLYPS, HYPERPLASTIC, HX OF 02/03/2009   Qualifier: Diagnosis of  By: Surface RN, [name] MENDS, COLON 02/03/2009   Qualifier: Diagnosis of  By: Surface RN, [name]     Dyslipidemia 06/20/2012   Fracture, intertrochanteric, left femur (HCC) 05/02/2012   GERD (gastroesophageal reflux disease)    Hearing loss    Bil/has hearing aids   Hemorrhoids    HEMORRHOIDS 02/03/2009   Qualifier: Diagnosis of  By: Surface RN, [name]     Hypertension    INFLAMMATORY BOWEL DISEASE - Followed by Dr. Jakie in GI 02/03/2009   Qualifier: Diagnosis of  By: Surface RN, [name] [name]    Turner's syndrome    was on Provera and Premarin and d/c this  at age 42yr   Ulcerative colitis    Past Surgical History:  Procedure Laterality Date   COLONOSCOPY  last 03/25/2013   HERNIA REPAIR  2009   lt ing    INCISION AND DRAINAGE  2004    lt thumb dog bite   INTRAMEDULLARY (IM) NAIL INTERTROCHANTERIC  05/02/2012   Procedure: INTRAMEDULLARY (IM) NAIL INTERTROCHANTRIC;  Surgeon: Melissa SHAUNNA Olmsted, MD;  Location: [facility] OR;  Service: Orthopedics;  Laterality: Left;   ORIF FINGER FRACTURE  06/14/2011   Procedure: OPEN REDUCTION INTERNAL FIXATION (ORIF) METACARPAL (FINGER) FRACTURE;  Surgeon: Melissa JONELLE Curia, MD;  Location: [facility];  Service: Orthopedics;  Laterality: Right;  right ring   POLYPECTOMY     TONSILLECTOMY     UPPER GASTROINTESTINAL ENDOSCOPY     Patient Active Problem List   Diagnosis Date Noted    Hyponatremia 10/19/2023   Thrombocytopenia 10/19/2023   Acute leg pain, left 07/19/2022   Osteopenia/?Osteoporosis - followed by her gynecologist, Dr. Kandyce 06/20/2012   Dyslipidemia 06/20/2012   H/O Turner syndrome 05/02/2012   Essential hypertension 02/03/2009   INFLAMMATORY BOWEL DISEASE - Followed by GI 02/03/2009   History of colonic polyps 02/03/2009    PCP: Melissa CHRISTELLA Sharper, MD   REFERRING PROVIDER: [name], MD  REFERRING DIAG: M54.50 (ICD-10-CM) - Lumbar pain  Rationale for Evaluation and Treatment: Rehabilitation  THERAPY DIAG:  No diagnosis found.  ONSET DATE: 4 years ago when it started; 2 years it has gotten worse   SUBJECTIVE:  SUBJECTIVE STATEMENT: *** Patient reports having to deal with other medical issues occurring concurrently with PT.   PERTINENT HISTORY:  Patient states she experienced a femur fracture in 2013 from a fall at work, but has not had any other injuries or surgeries to the area. Patient received a cortisone shot for Left hip pain then went to ER 3 days as she started having slurred speech, balance issues, and ambulation deficits. After patient was released, she went to Dr. Burnetta and received a prednisone  taper pack which assisted her pain, but she had withdrawal-like symptoms after completing. Patient also noted she is having platelet issues and is seeing a hematologist.  PAIN:  Are you having pain? Yes: NPRS scale: 5/10 with ambulation, 2/10 on the best day  Pain location: left lower back and down left LE  Pain description: burning Aggravating factors: walking Relieving factors: sitting/resting  PRECAUTIONS: None  RED FLAGS: Noting some bladder incontinence, but being followed by OB/GYN for that    WEIGHT BEARING RESTRICTIONS: No  FALLS:  Has  patient fallen in last 6 months? 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  DIAGNOSTIC FINDINGS:  XRAY: showing disc at loss at L4/5 and L5/S1.  No evidence of  instability on flexion/extension views.  No fracture or dislocation seen.   MRI IMPRESSION: 1. Right eccentric disc bulge and facet arthropathy at L4-L5 resulting in moderate right neural foraminal narrowing. 2. Disc bulge at L5-S1 with lateral disc contacting the exited left L5 nerve root in the extraforaminal zone.  PATIENT SURVEYS:  PSFS: THE PATIENT SPECIFIC FUNCTIONAL SCALE  Place score of 0-10 (0 = unable to perform activity and 10 = able to perform activity at the same level as before injury or problem)  Activity Date: 01/07/2024 02/10/2024 03/10/2024   Balance   1 5 7   2. Walking   3 6 6   3.     4.      Total Score 2 5.5 6.5    Total Score = Sum of activity scores/number of activities  Minimally Detectable Change: 3 points (for single activity); 2 points (for average score)  [facility] (nd). The Patient Specific Functional Scale . Retrieved from [email or web address]   COGNITION: Overall cognitive status: Within functional limits for tasks assessed     SENSATION: Light touch: WFL  MUSCLE LENGTH: Not assessed on eval  POSTURE: rounded shoulders, forward head, and increased thoracic kyphosis  PALPATION: TTP 2/10 at Left upper/lateral glute  LUMBAR ROM:   AROM  Eval 01/07/2024 02/10/2024  Flexion WFL   Extension 50%   Right lateral flexion 50-75%   Left lateral flexion 50-75%   Right rotation 50%   Left rotation 50%     (Blank rows = not tested)  LOWER EXTREMITY ROM:     ROM Right Eval 01/07/2024 Left Eval 01/07/2024  Hip flexion [facility]   Hip extension    Hip abduction    Hip adduction    Hip internal rotation    Hip external rotation    Knee flexion    Knee extension  Ankle dorsiflexion    Ankle plantarflexion    Ankle inversion    Ankle eversion     (Blank rows = not tested)  LOWER EXTREMITY MMT:    MMT Right Eval 01/07/2024 Left Eval 01/07/2024 03/10/2024 04/03/2024  Hip flexion (seated) 5/5 4-/5    Hip extension (prone) 3-/5 3-/5    Hip abduction (sidelying) 3-/5 3-/5 Rt: 3+/5 Lt: 3+/5 Lt: 2+/5  Hip adduction      Hip internal rotation      Hip external rotation      Knee flexion (seated) 5/5 4-/5    Knee extension (seated) 5/5 4/5    Ankle dorsiflexion      Ankle plantarflexion      Ankle inversion      Ankle eversion       (Blank rows = not tested)  LUMBAR SPECIAL TESTS:  Straight leg raise test: Negative, Slump test: Negative, and FABER test: Positive on L, but due to past hip fracture Clonus: negative   FUNCTIONAL TESTS:  5 times sit to stand: 10.27s  performed with SBA 4 stance balance: normal and narrow 10s, unable to complete tandem, 4s Rt 5s Lt single leg with lateral LOB and appropriate stepping reactions ; performed with CGA   03/10/2024 5xSTS: 11.22s performed with SBA  4 stance balance: normal: 10s narrow: 10s tandem Rt: 2s tandem LT: 3s Rt LE: 2s Lt LE: 2s   GAIT: Distance walked: not formally assessed  Assistive device utilized: None Level of assistance: SBA Comments: increased IR throughout gait with Left LE, antalgic gait pattern, Left lateral lean with Left stance  TREATMENT DATE:  04/14/2024 ***   04/03/2024 TherEx:  UBE level 2 for 8 minutes  Touch down squats to 18 arm chair  with 5# 2x8  PT assessed Lt hip abduction against gravity, ambulation, and palpated Lt hip musculature  PT observed trendelemburg with Lt stance, lateral lean with Lt stance PT found patient to have trigger points in Lt glute med, glute max, glute min, and piriformis   Updated MMT for hip abduction noted above  PT found that back mobility did not change pain in Lt hip  In depth discussion on dry needling (handout provided with note on top about extra charge), massage/percussive device, STM with tennis ball, POC and next steps towards independent HEP, how prior pain/surgeries can lead to long term changes in muscle use with certain activities  03/26/2024 TherEx:  UBE with bilat LE level 2 for 7 minutes  Lateral step ups with 4 step and bilat UE use, 2x10 each side  Suitcase carry 1x115' in Lt hand then 1x90' in Rt hand  Unable to complete same distance secondary to fatigue and decrease in balance  Seated marches 1x20 marches, 2x20 with 2# ankle weights   TherAct:  Patient performed ascending and descending of lobby stairs with CGA and using reciprocal pattern and 1 handrail  Descending of stairs with no difficulty or pain noted  Ascending stairs with increase Rt IR, posterior lean, and heavy UE use on handrails to pull fwd  Bilat leg press 2x12 with 56# Unilat leg press 2x10 with 25# each side   03/20/2024 TherEx:  UBE with bilat LE level 2 for 6 minutes  TRX squats 2x10  TRX lateral lunges using slider under contralat LE 2x8 each side  Paloff press 2x10 each side with green TB   Neuro Re-Ed:  Semitandem stance 3x20s per side  Step taps to 2 step with no UE use, 2x20  taps  Step taps to 4 step with no UE use, 2x20s taps   PATIENT EDUCATION:  Education details: HEP, POC  Person educated: Patient Education method: Explanation, Demonstration, Tactile cues, Verbal cues, and Handouts Education comprehension: verbalized understanding, returned demonstration, verbal cues required,  and tactile cues required  HOME EXERCISE PROGRAM: Access Code: [email or web address] URL: [email or web address] Date: 01/31/2024 Prepared by: Susannah Daring  Exercises - Standing Hip Abduction with Counter Support  - 1 x daily - 7 x weekly - 2 sets - 10 reps - Standing Hip Extension with Counter Support  - 1 x daily - 7 x weekly - 2 sets - 10 reps - Seated Active Straight-Leg Raise  - 1 x daily - 7 x weekly - 2 sets - 10 reps - Heel Raises with Counter Support  - 1 x daily - 7 x weekly - 2 sets - 10 reps - Mini Squat with Counter Support  - 1 x daily - 7 x weekly - 2 sets - 10 reps - Standing Lumbar Extension at Wall - Forearms  - 5 x daily - 7 x weekly - 1 sets - 5 reps - 3 seconds hold - Standing Hip Hiking  - 5 x daily - 7 x weekly - 1 sets - 5 reps - 3 seconds hold - Prone Hip Extension  - 2 x daily - 7 x weekly - 1-2 sets - 10 reps - 3 seconds hold - Romberg Stance with Head Rotation  - 1 x daily - 7 x weekly - 2 sets - 10 reps - Romberg Stance with Eyes Closed  - 1 x daily - 7 x weekly - 2 sets - 10 reps - Step Up  - 1 x daily - 7 x weekly - 2 sets - 10 reps - Standing Forward Step Taps with Counter Support  - 1 x daily - 7 x weekly - 2 sets - 10 reps  ASSESSMENT:  CLINICAL IMPRESSION: *** Patient arrived to session noting no change in symptoms. Patient has made progress in some areas of care, though has reached a plateau in some areas secondary to pain levels in Lt hip especially with ambulating. PT and patient discussed many topics, to include POC and next steps forward. Patient will continue to benefit from skilled PT.   OBJECTIVE IMPAIRMENTS: Abnormal gait, decreased activity tolerance, decreased balance, decreased coordination, decreased endurance, decreased mobility, difficulty walking, decreased ROM, decreased strength, improper body mechanics, postural dysfunction, and pain.   ACTIVITY LIMITATIONS: carrying, lifting, and stairs  PARTICIPATION LIMITATIONS: meal prep,  cleaning, laundry, community activity, and caretaker duties  PERSONAL FACTORS: Time since onset of injury/illness/exacerbation and 3+ comorbidities: GERD, HTN, ulcerative colitis, Turner's syndrome, osteopenia, dyslipidemia are also affecting patient's functional outcome.   REHAB POTENTIAL: Good  CLINICAL DECISION MAKING: Evolving/moderate complexity  EVALUATION COMPLEXITY: Moderate   GOALS: Goals reviewed with patient? Yes  SHORT TERM GOALS: Target date: 01/28/2024  Patient will show compliance with initial HEP. Baseline: Goal status: Met 01/23/2024  2.  Patient will report pain levels no greater than 5/10 to show improved overall quality of life. Baseline:  Goal status: ongoing, 04/03/2024   LONG TERM GOALS: Target date: 05/05/2024  Patient will show independence with final HEP in order to maintain and progress upon functional gains made within PT. Baseline:  Goal status: ongoing, 04/03/2024  2.  Patient will report pain levels no greater than 3/10 to show improved overall quality of life. Baseline:  Goal status: ongoing, 04/03/2024  3.  Patient will  increase PSFS to at least 4 to show significant improvement in subjective disability rating. Baseline:  Goal status: GOAL MET, 03/10/2024  4.  Patient will increase hip abduction MMT to at least 4-/5 in order to show improved biomechanics with functional mobility and gait.  Baseline:  Goal status: ongoing, 04/03/2024  5.  Patient will improve 4 stance balance in order to show increase in steadiness on feet.  Baseline:  Goal status: ongoing, 04/03/2024  PLAN:  PT FREQUENCY: 1-2x/week  PT DURATION: 8 weeks  PLANNED INTERVENTIONS: 97164- PT Re-evaluation, 97750- Physical Performance Testing, 97110-Therapeutic exercises, 97530- Therapeutic activity, 97112- Neuromuscular re-education, 97535- Self Care, 02859- Manual therapy, [phone]- Gait training, [phone]- Electrical stimulation (unattended), [phone]- Electrical stimulation  (manual), 97016- Vasopneumatic device, [id number]- Ultrasound, [id number]- Traction (mechanical), [id number]- Ionotophoresis 4mg /ml Dexamethasone , 79439 (1-2 muscles), [id number] (3+ muscles)- Dry Needling, Patient/Family education, Balance training, Stair training, Taping, Joint mobilization, Joint manipulation, Spinal manipulation, Spinal mobilization, Scar mobilization, Vestibular training, DME instructions, Cryotherapy, and Moist heat.  PLAN FOR NEXT SESSION:    *** stepping over hurdles, generalized LE strengthening, gait, balance, manual techniques   Susannah Daring, PT, DPT 04/13/24 8:41 AM

## 2024-04-14 ENCOUNTER — Ambulatory Visit

## 2024-04-14 ENCOUNTER — Other Ambulatory Visit: Payer: Self-pay | Admitting: Family Medicine

## 2024-04-14 DIAGNOSIS — M6281 Muscle weakness (generalized): Secondary | ICD-10-CM

## 2024-04-14 DIAGNOSIS — M5459 Other low back pain: Secondary | ICD-10-CM | POA: Diagnosis not present

## 2024-04-14 DIAGNOSIS — M25552 Pain in left hip: Secondary | ICD-10-CM

## 2024-04-14 DIAGNOSIS — R2681 Unsteadiness on feet: Secondary | ICD-10-CM | POA: Diagnosis not present

## 2024-04-14 DIAGNOSIS — R2689 Other abnormalities of gait and mobility: Secondary | ICD-10-CM

## 2024-04-14 DIAGNOSIS — M25652 Stiffness of left hip, not elsewhere classified: Secondary | ICD-10-CM

## 2024-04-14 DIAGNOSIS — I10 Essential (primary) hypertension: Secondary | ICD-10-CM

## 2024-04-15 ENCOUNTER — Inpatient Hospital Stay

## 2024-04-15 ENCOUNTER — Inpatient Hospital Stay: Admitting: Hematology & Oncology

## 2024-04-15 ENCOUNTER — Encounter: Payer: Self-pay | Admitting: Hematology & Oncology

## 2024-04-15 VITALS — BP 155/63 | HR 94 | Temp 97.9°F | Resp 18 | Ht <= 58 in | Wt 156.0 lb

## 2024-04-15 DIAGNOSIS — J90 Pleural effusion, not elsewhere classified: Secondary | ICD-10-CM

## 2024-04-15 DIAGNOSIS — Q969 Turner's syndrome, unspecified: Secondary | ICD-10-CM | POA: Diagnosis not present

## 2024-04-15 DIAGNOSIS — D696 Thrombocytopenia, unspecified: Secondary | ICD-10-CM

## 2024-04-15 LAB — CBC WITH DIFFERENTIAL (CANCER CENTER ONLY)
Abs Immature Granulocytes: 0.33 K/uL — ABNORMAL HIGH (ref 0.00–0.07)
Basophils Absolute: 0.1 K/uL (ref 0.0–0.1)
Basophils Relative: 0 %
Eosinophils Absolute: 0.1 K/uL (ref 0.0–0.5)
Eosinophils Relative: 1 %
HCT: 39.5 % (ref 36.0–46.0)
Hemoglobin: 12.6 g/dL (ref 12.0–15.0)
Immature Granulocytes: 2 %
Lymphocytes Relative: 23 %
Lymphs Abs: 3.4 K/uL (ref 0.7–4.0)
MCH: 27.6 pg (ref 26.0–34.0)
MCHC: 31.9 g/dL (ref 30.0–36.0)
MCV: 86.6 fL (ref 80.0–100.0)
Monocytes Absolute: 3.6 K/uL — ABNORMAL HIGH (ref 0.1–1.0)
Monocytes Relative: 25 %
Neutro Abs: 7.1 K/uL (ref 1.7–7.7)
Neutrophils Relative %: 49 %
Platelet Count: 92 K/uL — ABNORMAL LOW (ref 150–400)
RBC: 4.56 MIL/uL (ref 3.87–5.11)
RDW: 15.9 % — ABNORMAL HIGH (ref 11.5–15.5)
Smear Review: NORMAL
WBC Count: 14.7 K/uL — ABNORMAL HIGH (ref 4.0–10.5)
nRBC: 0 % (ref 0.0–0.2)

## 2024-04-15 LAB — SAMPLE TO BLOOD BANK

## 2024-04-15 MED ORDER — ROMIPLOSTIM 125 MCG ~~LOC~~ SOLR
1.0000 ug/kg | Freq: Once | SUBCUTANEOUS | Status: AC
  Administered 2024-04-15: 70 ug via SUBCUTANEOUS
  Filled 2024-04-15: qty 0.14

## 2024-04-15 NOTE — Progress Notes (Signed)
 Hematology and Oncology Follow Up Visit  [name] [id number] 1955-07-13 68 y.o. 04/15/2024   Principle Diagnosis:  Thrombocytopenia-possible ITP versus MDS -- NGS shows BCOR, TET2 & ZRSR  Current Therapy:   Nplate -q. weekly to keep platelet count above 100,000     Interim History:  [name] is back for [name] follow-up.  She is responding well to the Nplate .  [name] platelet count went from 61,000-92,000.  Now, we will see about doing the thoracentesis for this right pleural effusion.  I think would be a lot safer for [name] now.  I am not sure what this pleural effusion is all about.  It hopefully is just a transudate.  Otherwise, she is doing okay.  She still has some fatigue.  She does have some achiness.  The achiness may be from the Nplate  injection..  She has had no fever.  She has had no bleeding.  No change in bowel or bladder habits.  She has had no cough.  No shortness of breath.  She has had no rashes.  There maybe a little bit of leg swelling.   Overall, I would have to say that [name] performance status is probably ECOG 1.     Medications:  Current Outpatient Medications:    alendronate  (FOSAMAX ) 70 MG tablet, Take 70 mg by mouth once a week., Disp: , Rfl:    Calcium  Polycarbophil (FIBER-CAPS PO), Take 3 tablets by mouth daily at 6 (six) AM., Disp: , Rfl:    celecoxib  (CELEBREX ) 100 MG capsule, Take 100 mg by mouth as needed for mild pain (pain score 1-3) or moderate pain (pain score 4-6)., Disp: , Rfl:    Docusate Sodium  (COLACE PO), Take 3 capsules by mouth daily at 6 (six) AM., Disp: , Rfl:    lisinopril  (ZESTRIL ) 40 MG tablet, TAKE 1 TABLET BY MOUTH EVERY DAY, Disp: 90 tablet, Rfl: 1   metoprolol  succinate (TOPROL -XL) 25 MG 24 hr tablet, Take 1 tablet (25 mg total) by mouth daily., Disp: 90 tablet, Rfl: 1   rosuvastatin  (CRESTOR ) 20 MG tablet, TAKE 1 TABLET BY MOUTH DAILY. GENERIC EQUIVALENT FOR CRESTOR ., Disp: 90 tablet, Rfl: 1   traZODone  (DESYREL ) 50 MG tablet, TAKE  ONE-HALF TO ONE TABLET BY MOUTH AT BEDTIME AS NEED FOR SLEEP, Disp: 90 tablet, Rfl: 1   VITAMIN D  PO, Take 2,000 Units by mouth daily., Disp: , Rfl:   Allergies:  Allergies  Allergen Reactions   Prednisone  Nausea And Vomiting and Other (See Comments)     headache, dropping platelets    Past Medical History, Surgical history, Social history, and Family History were reviewed and updated.  Review of Systems: Review of Systems  Constitutional:  Positive for fatigue.  HENT:  Negative.    Eyes: Negative.   Respiratory: Negative.    Cardiovascular: Negative.   Gastrointestinal: Negative.   Endocrine: Negative.   Genitourinary: Negative.    Musculoskeletal: Negative.   Skin: Negative.   Neurological: Negative.   Hematological: Negative.   Psychiatric/Behavioral: Negative.      Physical Exam:  height is 4' 9 (1.448 m) and weight is 156 lb (70.8 kg). [name] oral temperature is 97.9 F (36.6 C). [name] blood pressure is 155/63 (abnormal) and [name] pulse is 94. [name] respiration is 18 and oxygen saturation is 98%.   Wt Readings from Last 3 Encounters:  04/15/24 156 lb (70.8 kg)  04/07/24 154 lb (69.9 kg)  03/16/24 156 lb (70.8 kg)    Physical Exam Vitals reviewed.  HENT:  Head: Normocephalic and atraumatic.  Eyes:     Pupils: Pupils are equal, round, and reactive to light.  Cardiovascular:     Rate and Rhythm: Normal rate and regular rhythm.     Heart sounds: Normal heart sounds.  Pulmonary:     Effort: Pulmonary effort is normal.     Breath sounds: Normal breath sounds.  Abdominal:     General: Bowel sounds are normal.     Palpations: Abdomen is soft.  Musculoskeletal:        General: No tenderness or deformity. Normal range of motion.     Cervical back: Normal range of motion.  Lymphadenopathy:     Cervical: No cervical adenopathy.  Skin:    General: Skin is warm and dry.     Findings: No erythema or rash.  Neurological:     Mental Status: She is alert and oriented to  person, place, and time.  Psychiatric:        Behavior: Behavior normal.        Thought Content: Thought content normal.        Judgment: Judgment normal.      Lab Results  Component Value Date   WBC 11.4 (H) 04/07/2024   HGB 12.7 04/07/2024   HCT 41.0 04/07/2024   MCV 87.4 04/07/2024   PLT 61 (L) 04/07/2024     Chemistry      Component Value Date/Time   NA 143 04/07/2024 0909   NA 137 11/29/2023 1545   K 4.6 04/07/2024 0909   CL 106 04/07/2024 0909   CO2 25 04/07/2024 0909   BUN 16 04/07/2024 0909   BUN 13 11/29/2023 1545   CREATININE 0.93 04/07/2024 0909      Component Value Date/Time   CALCIUM  9.5 04/07/2024 0909   ALKPHOS 73 04/07/2024 0909   AST 25 04/07/2024 0909   ALT 21 04/07/2024 0909   BILITOT 0.7 04/07/2024 0909     Normochromic normocytic population of red blood cells.  She has no nucleated red blood cells.  I see no teardrop cells.  She has no rouleaux formation.  There is no schistocytes or spherocytes.  White blood cells appear normal in morphology and maturation.  She may have a slight increase in neutrophils.  I do not see any immature myeloid or lymphoid cells.  Platelets are decreased in number.  She may have a rare large platelet.  Impression and Plan: [name] is a very nice 68 year old white female.  She has a history of Turner's syndrome.  She has thrombocytopenia.  Again, it is not clear as to the exact etiology.  The Nplate  did work.  [name] platelet count was 92,000.  I will like to give [name] another dose just to make sure that [name] platelet count is high enough so we can do the thoracentesis safely.  I am not sure if there is enough fluid to be taken out.  I told [name] that the radiologist will use an ultrasound to try to localize the fluid and if they do not think there is enough then they will not do the procedure.  I will see [name] back in another week.  If [name] platelet count is good enough, then we can try to spread [name] appointments out  little bit longer.   [name], MD 11/26/20253:06 PM

## 2024-04-15 NOTE — Patient Instructions (Signed)
 Romiplostim Injection What is this medication? ROMIPLOSTIM (roe mi PLOE stim) treats low levels of platelets in your body caused by immune thrombocytopenia (ITP). It is prescribed when other medications have not worked or cannot be tolerated. It may also be used to help people who have been exposed to high doses of radiation. It works by increasing the amount of platelets in your blood. This lowers the risk of bleeding. This medicine may be used for other purposes; ask your health care provider or pharmacist if you have questions. COMMON BRAND NAME(S): Nplate What should I tell my care team before I take this medication? They need to know if you have any of these conditions: Blood clots Myelodysplastic syndrome An unusual or allergic reaction to romiplostim, mannitol, other medications, foods, dyes, or preservatives Pregnant or trying to get pregnant Breast-feeding How should I use this medication? This medication is injected under the skin. It is given by a care team in a hospital or clinic setting. A special MedGuide will be given to you before each treatment. Be sure to read this information carefully each time. Talk to your care team about the use of this medication in children. While it may be prescribed for children as young as newborns for selected conditions, precautions do apply. Overdosage: If you think you have taken too much of this medicine contact a poison control center or emergency room at once. NOTE: This medicine is only for you. Do not share this medicine with others. What if I miss a dose? Keep appointments for follow-up doses. It is important not to miss your dose. Call your care team if you are unable to keep an appointment. What may interact with this medication? Interactions are not expected. This list may not describe all possible interactions. Give your health care provider a list of all the medicines, herbs, non-prescription drugs, or dietary supplements you use. Also  tell them if you smoke, drink alcohol, or use illegal drugs. Some items may interact with your medicine. What should I watch for while using this medication? Visit your care team for regular checks on your progress. You may need blood work done while you are taking this medication. Your condition will be monitored carefully while you are receiving this medication. It is important not to miss any appointments. What side effects may I notice from receiving this medication? Side effects that you should report to your care team as soon as possible: Allergic reactions--skin rash, itching, hives, swelling of the face, lips, tongue, or throat Blood clot--pain, swelling, or warmth in the leg, shortness of breath, chest pain Side effects that usually do not require medical attention (report to your care team if they continue or are bothersome): Dizziness Joint pain Muscle pain Pain in the hands or feet Stomach pain Trouble sleeping This list may not describe all possible side effects. Call your doctor for medical advice about side effects. You may report side effects to FDA at 1-800-FDA-1088. Where should I keep my medication? This medication is given in a hospital or clinic. It will not be stored at home. NOTE: This sheet is a summary. It may not cover all possible information. If you have questions about this medicine, talk to your doctor, pharmacist, or health care provider.  2024 Elsevier/Gold Standard (2021-09-11 00:00:00)

## 2024-04-17 ENCOUNTER — Encounter (INDEPENDENT_AMBULATORY_CARE_PROVIDER_SITE_OTHER): Payer: Self-pay

## 2024-04-23 ENCOUNTER — Encounter: Payer: Self-pay | Admitting: Hematology & Oncology

## 2024-04-23 ENCOUNTER — Other Ambulatory Visit: Payer: Self-pay

## 2024-04-23 ENCOUNTER — Inpatient Hospital Stay

## 2024-04-23 ENCOUNTER — Inpatient Hospital Stay: Attending: Nurse Practitioner

## 2024-04-23 ENCOUNTER — Inpatient Hospital Stay: Admitting: Hematology & Oncology

## 2024-04-23 VITALS — BP 113/69 | HR 72 | Temp 97.8°F | Resp 18 | Ht <= 58 in | Wt 155.0 lb

## 2024-04-23 DIAGNOSIS — D696 Thrombocytopenia, unspecified: Secondary | ICD-10-CM | POA: Insufficient documentation

## 2024-04-23 DIAGNOSIS — Q969 Turner's syndrome, unspecified: Secondary | ICD-10-CM | POA: Insufficient documentation

## 2024-04-23 DIAGNOSIS — J90 Pleural effusion, not elsewhere classified: Secondary | ICD-10-CM

## 2024-04-23 LAB — CBC WITH DIFFERENTIAL (CANCER CENTER ONLY)
Abs Immature Granulocytes: 0.48 K/uL — ABNORMAL HIGH (ref 0.00–0.07)
Basophils Absolute: 0.1 K/uL (ref 0.0–0.1)
Basophils Relative: 0 %
Eosinophils Absolute: 0.1 K/uL (ref 0.0–0.5)
Eosinophils Relative: 1 %
HCT: 40.7 % (ref 36.0–46.0)
Hemoglobin: 12.8 g/dL (ref 12.0–15.0)
Immature Granulocytes: 4 %
Lymphocytes Relative: 21 %
Lymphs Abs: 2.6 K/uL (ref 0.7–4.0)
MCH: 27.4 pg (ref 26.0–34.0)
MCHC: 31.4 g/dL (ref 30.0–36.0)
MCV: 87 fL (ref 80.0–100.0)
Monocytes Absolute: 2.6 K/uL — ABNORMAL HIGH (ref 0.1–1.0)
Monocytes Relative: 21 %
Neutro Abs: 6.7 K/uL (ref 1.7–7.7)
Neutrophils Relative %: 53 %
Platelet Count: 179 K/uL (ref 150–400)
RBC: 4.68 MIL/uL (ref 3.87–5.11)
RDW: 15.7 % — ABNORMAL HIGH (ref 11.5–15.5)
WBC Count: 12.6 K/uL — ABNORMAL HIGH (ref 4.0–10.5)
nRBC: 0 % (ref 0.0–0.2)

## 2024-04-23 LAB — CMP (CANCER CENTER ONLY)
ALT: 19 U/L (ref 0–44)
AST: 22 U/L (ref 15–41)
Albumin: 4.6 g/dL (ref 3.5–5.0)
Alkaline Phosphatase: 68 U/L (ref 38–126)
Anion gap: 12 (ref 5–15)
BUN: 17 mg/dL (ref 8–23)
CO2: 25 mmol/L (ref 22–32)
Calcium: 9.8 mg/dL (ref 8.9–10.3)
Chloride: 106 mmol/L (ref 98–111)
Creatinine: 0.89 mg/dL (ref 0.44–1.00)
GFR, Estimated: >60 mL/min (ref >60)
Glucose, Bld: 97 mg/dL (ref 70–99)
Potassium: 4.5 mmol/L (ref 3.5–5.1)
Sodium: 143 mmol/L (ref 135–145)
Total Bilirubin: 0.7 mg/dL (ref 0.0–1.2)
Total Protein: 6.6 g/dL (ref 6.5–8.1)

## 2024-04-23 LAB — SAVE SMEAR(SSMR), FOR PROVIDER SLIDE REVIEW

## 2024-04-23 NOTE — Progress Notes (Signed)
 Hematology and Oncology Follow Up Visit  [name] [id number] 04/17/1956 68 y.o. 04/23/2024   Principle Diagnosis:  Thrombocytopenia-possible ITP versus MDS -- NGS shows BCOR, TET2 & ZRSR  Current Therapy:   Nplate -q. weekly to keep platelet count above 100,000     Interim History:  [name] is back for her follow-up.  Her platelet counts come up incredibly well.  Today, her platelet count is 179,000.  As such, she will not need nplate .  She is yet to have the thoracentesis.  We put the order in last week.  It has not yet been scheduled.  We will have to find out what the issue is.  At least, we know that her platelet count will be adequate for the thoracentesis.  She has had no problems with cough or shortness of breath.  She has had no bleeding.  She has had no bruising.  She has had no change in bowel or bladder habits.  She is going up to [place] .  Her mother is turning 60 years old.  She and her sister will be having a party for her.  There has been no problems with rashes.  She has had no leg swelling.  Overall, I will say that her performance status is probably ECOG 1.    Medications:  Current Outpatient Medications:    alendronate  (FOSAMAX ) 70 MG tablet, Take 70 mg by mouth once a week., Disp: , Rfl:    Calcium  Polycarbophil (FIBER-CAPS PO), Take 3 tablets by mouth daily at 6 (six) AM., Disp: , Rfl:    celecoxib  (CELEBREX ) 100 MG capsule, Take 100 mg by mouth as needed for mild pain (pain score 1-3) or moderate pain (pain score 4-6)., Disp: , Rfl:    Docusate Sodium  (COLACE PO), Take 3 capsules by mouth daily at 6 (six) AM., Disp: , Rfl:    lisinopril  (ZESTRIL ) 40 MG tablet, TAKE 1 TABLET BY MOUTH EVERY DAY, Disp: 90 tablet, Rfl: 1   metoprolol  succinate (TOPROL -XL) 25 MG 24 hr tablet, Take 1 tablet (25 mg total) by mouth daily., Disp: 90 tablet, Rfl: 1   rosuvastatin  (CRESTOR ) 20 MG tablet, TAKE 1 TABLET BY MOUTH DAILY. GENERIC EQUIVALENT FOR CRESTOR ., Disp: 90  tablet, Rfl: 1   traZODone  (DESYREL ) 50 MG tablet, TAKE ONE-HALF TO ONE TABLET BY MOUTH AT BEDTIME AS NEED FOR SLEEP, Disp: 90 tablet, Rfl: 1   VITAMIN D  PO, Take 2,000 Units by mouth daily., Disp: , Rfl:   Allergies:  Allergies  Allergen Reactions   Prednisone  Nausea And Vomiting and Other (See Comments)     headache, dropping platelets    Past Medical History, Surgical history, Social history, and Family History were reviewed and updated.  Review of Systems: Review of Systems  Constitutional:  Positive for fatigue.  HENT:  Negative.    Eyes: Negative.   Respiratory: Negative.    Cardiovascular: Negative.   Gastrointestinal: Negative.   Endocrine: Negative.   Genitourinary: Negative.    Musculoskeletal: Negative.   Skin: Negative.   Neurological: Negative.   Hematological: Negative.   Psychiatric/Behavioral: Negative.      Physical Exam:  height is 4' 9 (1.448 m) and weight is 155 lb (70.3 kg). Her oral temperature is 97.8 F (36.6 C). Her blood pressure is 113/69 and her pulse is 72. Her respiration is 18 and oxygen saturation is 99%.   Wt Readings from Last 3 Encounters:  04/23/24 155 lb (70.3 kg)  04/15/24 156 lb (70.8 kg)  04/07/24 154  lb (69.9 kg)    Physical Exam Vitals reviewed.  HENT:     Head: Normocephalic and atraumatic.  Eyes:     Pupils: Pupils are equal, round, and reactive to light.  Cardiovascular:     Rate and Rhythm: Normal rate and regular rhythm.     Heart sounds: Normal heart sounds.  Pulmonary:     Effort: Pulmonary effort is normal.     Breath sounds: Normal breath sounds.  Abdominal:     General: Bowel sounds are normal.     Palpations: Abdomen is soft.  Musculoskeletal:        General: No tenderness or deformity. Normal range of motion.     Cervical back: Normal range of motion.  Lymphadenopathy:     Cervical: No cervical adenopathy.  Skin:    General: Skin is warm and dry.     Findings: No erythema or rash.  Neurological:      Mental Status: She is alert and oriented to person, place, and time.  Psychiatric:        Behavior: Behavior normal.        Thought Content: Thought content normal.        Judgment: Judgment normal.      Lab Results  Component Value Date   WBC 12.6 (H) 04/23/2024   HGB 12.8 04/23/2024   HCT 40.7 04/23/2024   MCV 87.0 04/23/2024   PLT 179 04/23/2024     Chemistry      Component Value Date/Time   NA 143 04/07/2024 0909   NA 137 11/29/2023 1545   K 4.6 04/07/2024 0909   CL 106 04/07/2024 0909   CO2 25 04/07/2024 0909   BUN 16 04/07/2024 0909   BUN 13 11/29/2023 1545   CREATININE 0.93 04/07/2024 0909      Component Value Date/Time   CALCIUM  9.5 04/07/2024 0909   ALKPHOS 73 04/07/2024 0909   AST 25 04/07/2024 0909   ALT 21 04/07/2024 0909   BILITOT 0.7 04/07/2024 0909     Normochromic normocytic population of red blood cells.  She has no nucleated red blood cells.  I see no teardrop cells.  She has no rouleaux formation.  There is no schistocytes or spherocytes.  White blood cells appear normal in morphology and maturation.  She may have a slight increase in neutrophils.  I do not see any immature myeloid or lymphoid cells.  Platelets are adequate in number.  She may have a rare large platelet.  Impression and Plan: [name] is a very nice 68 year old white female.  She has a history of Turner's syndrome.  She has thrombocytopenia.  Again, it is not clear as to the exact etiology.  The Nplate  did work.  Her platelet count is high enough that she does not need any Nplate  today.  We will now try to move her appointments out every 2 weeks.  If her platelet count stabilizes, then we can hopefully move her appointments out a little bit longer.  We will call Radiology to see what is going on with the thoracentesis and hopefully have it scheduled for next week.  I will see her back in 2 weeks.   Melissa JONELLE Crease, MD 12/4/202511:06 AM

## 2024-04-25 ENCOUNTER — Other Ambulatory Visit: Payer: Self-pay | Admitting: Family Medicine

## 2024-04-25 DIAGNOSIS — I10 Essential (primary) hypertension: Secondary | ICD-10-CM

## 2024-04-25 DIAGNOSIS — E785 Hyperlipidemia, unspecified: Secondary | ICD-10-CM

## 2024-04-28 ENCOUNTER — Ambulatory Visit (HOSPITAL_COMMUNITY)
Admission: RE | Admit: 2024-04-28 | Discharge: 2024-04-28 | Attending: Hematology & Oncology | Admitting: Hematology & Oncology

## 2024-04-28 ENCOUNTER — Other Ambulatory Visit: Payer: Self-pay | Admitting: Hematology & Oncology

## 2024-04-28 DIAGNOSIS — D696 Thrombocytopenia, unspecified: Secondary | ICD-10-CM

## 2024-04-28 DIAGNOSIS — J90 Pleural effusion, not elsewhere classified: Secondary | ICD-10-CM | POA: Diagnosis not present

## 2024-04-28 MED ORDER — LIDOCAINE-EPINEPHRINE 1 %-1:100000 IJ SOLN
INTRAMUSCULAR | Status: AC
  Filled 2024-04-28: qty 1

## 2024-04-28 NOTE — Progress Notes (Signed)
 Patient presents therapeutic right sided thoracentesis. US  limited  shows small amount of left pleural fluid  After discussion of the risks versus the benefits of the procedure the Patient elected to defer the thoracentesis at this time.  Procedure not performed.

## 2024-04-29 NOTE — Therapy (Signed)
 OUTPATIENT PHYSICAL THERAPY THORACOLUMBAR TREATMENT / RECERTIFICATION  Patient Name: [name] MRN: [id number] DOB:1955/08/28, 68 y.o., female Today's Date: 04/30/2024    END OF SESSION:  PT End of Session - 04/30/24 0939     Visit Number 12    Number of Visits 18    Date for Recertification  06/11/24    Authorization Type BCBS MEDICARE $20 COPAY    Progress Note Due on Visit 20    PT Start Time 0936    PT Stop Time 1014    PT Time Calculation (min) 38 min    Activity Tolerance Patient tolerated treatment well    Behavior During Therapy [facility] for tasks assessed/performed          Past Medical History:  Diagnosis Date   B12 DEFICIENCY 02/09/2009   Qualifier: Diagnosis of  By: Joshua RN, CGRN, Sheri     Blood in stool    COLONIC POLYPS, HYPERPLASTIC, HX OF 02/03/2009   Qualifier: Diagnosis of  By: Surface RN, Arland MENDS, COLON 02/03/2009   Qualifier: Diagnosis of  By: Surface RN, Donna     Dyslipidemia 06/20/2012   Fracture, intertrochanteric, left femur (HCC) 05/02/2012   GERD (gastroesophageal reflux disease)    Hearing loss    Bil/has hearing aids   Hemorrhoids    HEMORRHOIDS 02/03/2009   Qualifier: Diagnosis of  By: Surface RN, Arland     Hypertension    INFLAMMATORY BOWEL DISEASE - Followed by Dr. Jakie in GI 02/03/2009   Qualifier: Diagnosis of  By: Surface RN, Arland Bristle    Turner's syndrome    was on Provera and Premarin and d/c this  at age 79yr   Ulcerative colitis    Past Surgical History:  Procedure Laterality Date   COLONOSCOPY  last 03/25/2013   HERNIA REPAIR  2009   lt ing    INCISION AND DRAINAGE  2004    lt thumb dog bite   INTRAMEDULLARY (IM) NAIL INTERTROCHANTERIC  05/02/2012   Procedure: INTRAMEDULLARY (IM) NAIL INTERTROCHANTRIC;  Surgeon: Fonda SHAUNNA Olmsted, MD;  Location: [facility] OR;  Service: Orthopedics;  Laterality: Left;   ORIF FINGER FRACTURE  06/14/2011   Procedure: OPEN REDUCTION INTERNAL FIXATION (ORIF) METACARPAL  (FINGER) FRACTURE;  Surgeon: Franky JONELLE Curia, MD;  Location: [facility];  Service: Orthopedics;  Laterality: Right;  right ring   POLYPECTOMY     TONSILLECTOMY     UPPER GASTROINTESTINAL ENDOSCOPY     Patient Active Problem List   Diagnosis Date Noted   Hyponatremia 10/19/2023   Thrombocytopenia 10/19/2023   Acute leg pain, left 07/19/2022   Osteopenia/?Osteoporosis - followed by her gynecologist, Dr. Kandyce 06/20/2012   Dyslipidemia 06/20/2012   H/O Turner syndrome 05/02/2012   Essential hypertension 02/03/2009   INFLAMMATORY BOWEL DISEASE - Followed by GI 02/03/2009   History of colonic polyps 02/03/2009    PCP: Heron CHRISTELLA Sharper, MD   REFERRING PROVIDER: [name], MD  REFERRING DIAG: M54.50 (ICD-10-CM) - Lumbar pain  Rationale for Evaluation and Treatment: Rehabilitation  THERAPY DIAG:  Other low back pain  Muscle weakness (generalized)  Unsteadiness on feet  Pain in left hip  Stiffness of left hip, not elsewhere classified  Other abnormalities of gait and mobility  ONSET DATE: 4 years ago when it started; 2 years it has gotten worse   SUBJECTIVE:  SUBJECTIVE STATEMENT: Patient reports an improvement in platelets with new treatment and fluid on lungs in so small that they aren't concerned.   PERTINENT HISTORY:  Patient states she experienced a femur fracture in 2013 from a fall at work, but has not had any other injuries or surgeries to the area. Patient received a cortisone shot for Left hip pain then went to ER 3 days as she started having slurred speech, balance issues, and ambulation deficits. After patient was released, she went to Dr. Burnetta and received a prednisone  taper pack which assisted her pain, but she had withdrawal-like symptoms after completing.  Patient also noted she is having platelet issues and is seeing a hematologist.  PAIN:  Are you having pain? Yes: NPRS scale: 7/10 this morning  Pain location: left lower back and down left LE  Pain description: burning Aggravating factors: walking Relieving factors: sitting/resting  PRECAUTIONS: None  RED FLAGS: Noting some bladder incontinence, but being followed by OB/GYN for that    WEIGHT BEARING RESTRICTIONS: No  FALLS:  Has patient fallen in last 6 months? 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  DIAGNOSTIC FINDINGS:  XRAY: showing disc at loss at L4/5 and L5/S1.  No evidence of  instability on flexion/extension views.  No fracture or dislocation seen.   MRI IMPRESSION: 1. Right eccentric disc bulge and facet arthropathy at L4-L5 resulting in moderate right neural foraminal narrowing. 2. Disc bulge at L5-S1 with lateral disc contacting the exited left L5 nerve root in the extraforaminal zone.  PATIENT SURVEYS:  PSFS: THE PATIENT SPECIFIC FUNCTIONAL SCALE  Place score of 0-10 (0 = unable to perform activity and 10 = able to perform activity at the same level as before injury or problem)  Activity Date: 01/07/2024 02/10/2024 03/10/2024   Balance   1 5 7   2. Walking   3 6 6   3.     4.      Total Score 2 5.5 6.5    Total Score = Sum of activity scores/number of activities  Minimally Detectable Change: 3 points (for single activity); 2 points (for average score)  [facility]  (nd). The Patient Specific Functional Scale . Retrieved from [email or web address]   COGNITION: Overall cognitive status: Within functional limits for tasks assessed     SENSATION: Light touch: WFL  MUSCLE LENGTH: Not assessed on eval  POSTURE: rounded shoulders, forward head, and increased thoracic kyphosis  PALPATION: TTP 2/10 at Left upper/lateral glute  LUMBAR ROM:   AROM Eval 01/07/2024 02/10/2024  Flexion WFL   Extension 50%   Right lateral flexion 50-75%   Left lateral flexion 50-75%   Right rotation 50%   Left rotation 50%     (Blank rows = not tested)  LOWER EXTREMITY ROM:     ROM Right Eval 01/07/2024 Left Eval 01/07/2024  Hip flexion [facility]   Hip extension    Hip abduction    Hip adduction    Hip internal rotation    Hip external rotation    Knee flexion    Knee  extension    Ankle dorsiflexion    Ankle plantarflexion    Ankle inversion    Ankle eversion     (Blank rows = not tested)  LOWER EXTREMITY MMT:    MMT Right Eval 01/07/2024 Left Eval 01/07/2024 03/10/2024 04/03/2024  Hip flexion (seated) 5/5 4-/5    Hip extension (prone) 3-/5 3-/5    Hip abduction (sidelying) 3-/5 3-/5 Rt: 3+/5 Lt: 3+/5 Lt: 2+/5  Hip adduction      Hip internal rotation      Hip external rotation      Knee flexion (seated) 5/5 4-/5    Knee extension (seated) 5/5 4/5    Ankle dorsiflexion      Ankle plantarflexion      Ankle inversion      Ankle eversion       (Blank rows = not tested)  LUMBAR SPECIAL TESTS:  Straight leg raise test: Negative, Slump test: Negative, and FABER test: Positive on L, but due to past hip fracture Clonus: negative   FUNCTIONAL TESTS:  5 times sit to stand: 10.27s  performed with SBA 4 stance balance: normal and narrow 10s, unable to complete tandem, 4s Rt 5s Lt single leg with lateral LOB and appropriate stepping reactions ; performed with CGA   03/10/2024 5xSTS: 11.22s performed  with SBA  4 stance balance: normal: 10s narrow: 10s tandem Rt: 2s tandem LT: 3s Rt LE: 2s Lt LE: 2s   GAIT: Distance walked: not formally assessed  Assistive device utilized: None Level of assistance: SBA Comments: increased IR throughout gait with Left LE, antalgic gait pattern, Left lateral lean with Left stance  TREATMENT DATE:  04/30/2024 TherEx:  Nustep LE only level 3 for 8 minutes   TherAct:  Step ups with 4 step using mat table for UE support intermittently 2x10 each side  Lateral step ups with 4 step using mat table for UE support 2x10 each side  Step over and back with small hurdle using unilat UE for stability support 2x10 each LE  Lateral step over small hurdle using UE support on mat table for stability 2x20 steps   04/14/2024 TherEx:   UBE level 2 for 8 minutes  Single knee to chest 2x30s each side  Modified supine piriformis stretch Rt side only 2x30s  PT assist supine Lt piriformis stretch 1x30s, though discontinued secondary to pain levels  Supine bridges with blue TB around knees 2x12 with 2s hold  Supine clamshells 2x12 with 2s holds ; Rt LE doing most work without verbal cues   Manual:  STM with intermittent IASTM with percussive device to Rt glutes and piriformis   04/03/2024 TherEx:  UBE level 2 for 8 minutes  Touch down squats to 18 arm chair with 5# 2x8  PT assessed Lt hip abduction against gravity, ambulation, and palpated Lt hip musculature  PT observed trendelemburg with Lt stance, lateral lean with Lt stance PT found patient to have trigger points in Lt glute med, glute max, glute min, and piriformis   Updated MMT for hip abduction noted above  PT found that back mobility did not change pain in Lt hip  In depth discussion on dry needling (handout provided with note on top about extra charge), massage/percussive device, STM with tennis ball, POC and next steps towards independent HEP, how prior pain/surgeries can lead to long term changes in  muscle use with certain activities  03/26/2024 TherEx:  UBE with bilat LE level 2 for 7 minutes  Lateral step ups  with 4 step and bilat UE use, 2x10 each side  Suitcase carry 1x115' in Lt hand then 1x90' in Rt hand  Unable to complete same distance secondary to fatigue and decrease in balance  Seated marches 1x20 marches, 2x20 with 2# ankle weights   TherAct:  Patient performed ascending and descending of lobby stairs with CGA and using reciprocal pattern and 1 handrail  Descending of stairs with no difficulty or pain noted  Ascending stairs with increase Rt IR, posterior lean, and heavy UE use on handrails to pull fwd  Bilat leg press 2x12 with 56# Unilat leg press 2x10 with 25# each side   03/20/2024 TherEx:  UBE with bilat LE level 2 for 6 minutes  TRX squats 2x10  TRX lateral lunges using slider under contralat LE 2x8 each side  Paloff press 2x10 each side with green TB   Neuro Re-Ed:  Semitandem stance 3x20s per side  Step taps to 2 step with no UE use, 2x20 taps  Step taps to 4 step with no UE use, 2x20s taps   PATIENT EDUCATION:  Education details: HEP, POC  Person educated: Patient Education method: Explanation, Demonstration, Tactile cues, Verbal cues, and Handouts Education comprehension: verbalized understanding, returned demonstration, verbal cues required, and tactile cues required  HOME EXERCISE PROGRAM: Access Code: [email or web address] URL: [email or web address] Date: 01/31/2024 Prepared by: Susannah Daring  Exercises - Standing Hip Abduction with Counter Support  - 1 x daily - 7 x weekly - 2 sets - 10 reps - Standing Hip Extension with Counter Support  - 1 x daily - 7 x weekly - 2 sets - 10 reps - Seated Active Straight-Leg Raise  - 1 x daily - 7 x weekly - 2 sets - 10 reps - Heel Raises with Counter Support  - 1 x daily - 7 x weekly - 2 sets - 10 reps - Mini Squat with Counter Support  - 1 x daily - 7 x weekly - 2 sets - 10 reps - Standing Lumbar  Extension at Wall - Forearms  - 5 x daily - 7 x weekly - 1 sets - 5 reps - 3 seconds hold - Standing Hip Hiking  - 5 x daily - 7 x weekly - 1 sets - 5 reps - 3 seconds hold - Prone Hip Extension  - 2 x daily - 7 x weekly - 1-2 sets - 10 reps - 3 seconds hold - Romberg Stance with Head Rotation  - 1 x daily - 7 x weekly - 2 sets - 10 reps - Romberg Stance with Eyes Closed  - 1 x daily - 7 x weekly - 2 sets - 10 reps - Step Up  - 1 x daily - 7 x weekly - 2 sets - 10 reps - Standing Forward Step Taps with Counter Support  - 1 x daily - 7 x weekly - 2 sets - 10 reps  ASSESSMENT:  CLINICAL IMPRESSION: Patient arrived to session noting increased soreness in Lt LE this date, though improved with mobility/movement. Patient with improved overall performance with TherAct this date and intermittently performing activities with limited to no UE support. Patient will continue to benefit from skilled PT.   OBJECTIVE IMPAIRMENTS: Abnormal gait, decreased activity tolerance, decreased balance, decreased coordination, decreased endurance, decreased mobility, difficulty walking, decreased ROM, decreased strength, improper body mechanics, postural dysfunction, and pain.   ACTIVITY LIMITATIONS: carrying, lifting, and stairs  PARTICIPATION LIMITATIONS: meal prep, cleaning, laundry, community activity, and caretaker duties  PERSONAL FACTORS: Time  since onset of injury/illness/exacerbation and 3+ comorbidities: GERD, HTN, ulcerative colitis, Turner's syndrome, osteopenia, dyslipidemia are also affecting patient's functional outcome.   REHAB POTENTIAL: Good  CLINICAL DECISION MAKING: Evolving/moderate complexity  EVALUATION COMPLEXITY: Moderate   GOALS: Goals reviewed with patient? Yes  SHORT TERM GOALS: Target date: 01/28/2024  Patient will show compliance with initial HEP. Baseline: Goal status: Met 01/23/2024  2.  Patient will report pain levels no greater than 5/10 to show improved overall quality of  life. Baseline:  Goal status: ongoing, 04/03/2024   LONG TERM GOALS: Target date: 06/12/2023  Patient will show independence with final HEP in order to maintain and progress upon functional gains made within PT. Baseline:  Goal status: ongoing, 04/03/2024  2.  Patient will report pain levels no greater than 3/10 to show improved overall quality of life. Baseline:  Goal status: ongoing, 04/03/2024  3.  Patient will increase PSFS to at least 4 to show significant improvement in subjective disability rating. Baseline:  Goal status: GOAL MET, 03/10/2024  4.  Patient will increase hip abduction MMT to at least 4-/5 in order to show improved biomechanics with functional mobility and gait.  Baseline:  Goal status: ongoing, 04/03/2024  5.  Patient will improve 4 stance balance in order to show increase in steadiness on feet.  Baseline:  Goal status: ongoing, 04/03/2024  PLAN:  PT FREQUENCY: 1-2x/week  PT DURATION: 8 weeks  PLANNED INTERVENTIONS: 97164- PT Re-evaluation, 97750- Physical Performance Testing, 97110-Therapeutic exercises, 97530- Therapeutic activity, 97112- Neuromuscular re-education, 97535- Self Care, 02859- Manual therapy, [phone]- Gait training, [phone]- Electrical stimulation (unattended), [phone]- Electrical stimulation (manual), 97016- Vasopneumatic device, [id number]- Ultrasound, [id number]- Traction (mechanical), [id number]- Ionotophoresis 4mg /ml Dexamethasone , 79439 (1-2 muscles), 20561 (3+ muscles)- Dry Needling, Patient/Family education, Balance training, Stair training, Taping, Joint mobilization, Joint manipulation, Spinal manipulation, Spinal mobilization, Scar mobilization, Vestibular training, DME instructions, Cryotherapy, and Moist heat.  PLAN FOR NEXT SESSION:  stepping over hurdles, generalized LE strengthening, gait, balance, manual techniques (no dry needling)    Susannah Daring, PT, DPT 04/30/2024 10:22 AM

## 2024-04-30 ENCOUNTER — Ambulatory Visit

## 2024-04-30 DIAGNOSIS — M25552 Pain in left hip: Secondary | ICD-10-CM | POA: Diagnosis not present

## 2024-04-30 DIAGNOSIS — M5459 Other low back pain: Secondary | ICD-10-CM | POA: Diagnosis not present

## 2024-04-30 DIAGNOSIS — M25652 Stiffness of left hip, not elsewhere classified: Secondary | ICD-10-CM

## 2024-04-30 DIAGNOSIS — R2681 Unsteadiness on feet: Secondary | ICD-10-CM

## 2024-04-30 DIAGNOSIS — M6281 Muscle weakness (generalized): Secondary | ICD-10-CM

## 2024-04-30 DIAGNOSIS — R2689 Other abnormalities of gait and mobility: Secondary | ICD-10-CM | POA: Diagnosis not present

## 2024-05-06 NOTE — Therapy (Signed)
 OUTPATIENT PHYSICAL THERAPY THORACOLUMBAR TREATMENT  Patient Name: [name] MRN: [id number] DOB:1955/08/16, 68 y.o., female Today's Date: 05/07/2024    END OF SESSION:  PT End of Session - 05/07/24 0930     Visit Number 13    Number of Visits 18    Date for Recertification  06/11/24    Authorization Type BCBS MEDICARE $20 COPAY    Progress Note Due on Visit 20    PT Start Time 0930    PT Stop Time 1014    PT Time Calculation (min) 44 min    Activity Tolerance Patient tolerated treatment well    Behavior During Therapy [facility] for tasks assessed/performed           Past Medical History:  Diagnosis Date   B12 DEFICIENCY 02/09/2009   Qualifier: Diagnosis of  By: Joshua RN, CGRN, Sheri     Blood in stool    COLONIC POLYPS, HYPERPLASTIC, HX OF 02/03/2009   Qualifier: Diagnosis of  By: Surface RN, Arland MENDS, COLON 02/03/2009   Qualifier: Diagnosis of  By: Surface RN, Donna     Dyslipidemia 06/20/2012   Fracture, intertrochanteric, left femur (HCC) 05/02/2012   GERD (gastroesophageal reflux disease)    Hearing loss    Bil/has hearing aids   Hemorrhoids    HEMORRHOIDS 02/03/2009   Qualifier: Diagnosis of  By: Surface RN, Arland     Hypertension    INFLAMMATORY BOWEL DISEASE - Followed by Dr. Jakie in GI 02/03/2009   Qualifier: Diagnosis of  By: Surface RN, Arland Bristle    Turner's syndrome    was on Provera and Premarin and d/c this  at age 78yr   Ulcerative colitis    Past Surgical History:  Procedure Laterality Date   COLONOSCOPY  last 03/25/2013   HERNIA REPAIR  2009   lt ing    INCISION AND DRAINAGE  2004    lt thumb dog bite   INTRAMEDULLARY (IM) NAIL INTERTROCHANTERIC  05/02/2012   Procedure: INTRAMEDULLARY (IM) NAIL INTERTROCHANTRIC;  Surgeon: Fonda SHAUNNA Olmsted, MD;  Location: [facility] OR;  Service: Orthopedics;  Laterality: Left;   ORIF FINGER FRACTURE  06/14/2011   Procedure: OPEN REDUCTION INTERNAL FIXATION (ORIF) METACARPAL (FINGER)  FRACTURE;  Surgeon: Franky JONELLE Curia, MD;  Location: [facility];  Service: Orthopedics;  Laterality: Right;  right ring   POLYPECTOMY     TONSILLECTOMY     UPPER GASTROINTESTINAL ENDOSCOPY     Patient Active Problem List   Diagnosis Date Noted   Hyponatremia 10/19/2023   Thrombocytopenia 10/19/2023   Acute leg pain, left 07/19/2022   Osteopenia/?Osteoporosis - followed by her gynecologist, Dr. Kandyce 06/20/2012   Dyslipidemia 06/20/2012   H/O Turner syndrome 05/02/2012   Essential hypertension 02/03/2009   INFLAMMATORY BOWEL DISEASE - Followed by GI 02/03/2009   History of colonic polyps 02/03/2009    PCP: Heron CHRISTELLA Sharper, MD   REFERRING PROVIDER: [name], MD  REFERRING DIAG: M54.50 (ICD-10-CM) - Lumbar pain  Rationale for Evaluation and Treatment: Rehabilitation  THERAPY DIAG:  Other low back pain  Muscle weakness (generalized)  Pain in left hip  Stiffness of left hip, not elsewhere classified  Unsteadiness on feet  Other abnormalities of gait and mobility  ONSET DATE: 4 years ago when it started; 2 years it has gotten worse   SUBJECTIVE:  SUBJECTIVE STATEMENT: Patient reports that she hasn't noticed a change in pain levels.   PERTINENT HISTORY:  Patient states she experienced a femur fracture in 2013 from a fall at work, but has not had any other injuries or surgeries to the area. Patient received a cortisone shot for Left hip pain then went to ER 3 days as she started having slurred speech, balance issues, and ambulation deficits. After patient was released, she went to Dr. Burnetta and received a prednisone  taper pack which assisted her pain, but she had withdrawal-like symptoms after completing. Patient also noted she is having platelet issues and is seeing a  hematologist.  PAIN:  Are you having pain? Yes: NPRS scale: 6/10 this morning  Pain location: left lower back and down left LE  Pain description: burning Aggravating factors: walking Relieving factors: sitting/resting  PRECAUTIONS: None  RED FLAGS: Noting some bladder incontinence, but being followed by OB/GYN for that    WEIGHT BEARING RESTRICTIONS: No  FALLS:  Has patient fallen in last 6 months? 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  DIAGNOSTIC FINDINGS:  XRAY: showing disc at loss at L4/5 and L5/S1.  No evidence of  instability on flexion/extension views.  No fracture or dislocation seen.   MRI IMPRESSION: 1. Right eccentric disc bulge and facet arthropathy at L4-L5 resulting in moderate right neural foraminal narrowing. 2. Disc bulge at L5-S1 with lateral disc contacting the exited left L5 nerve root in the extraforaminal zone.  PATIENT SURVEYS:  PSFS: THE PATIENT SPECIFIC FUNCTIONAL SCALE  Place score of 0-10 (0 = unable to perform activity and 10 = able to perform activity at the same level as before injury or problem)  Activity Date: 01/07/2024 02/10/2024 03/10/2024   Balance   1 5 7   2. Walking   3 6 6   3.     4.      Total Score 2 5.5 6.5    Total Score = Sum of activity scores/number of activities  Minimally Detectable Change: 3 points (for single activity); 2 points (for average score)  [facility] (nd). The Patient Specific Functional Scale . Retrieved from  [email or web address]   COGNITION: Overall cognitive status: Within functional limits for tasks assessed     SENSATION: Light touch: WFL  MUSCLE LENGTH: Not assessed on eval  POSTURE: rounded shoulders, forward head, and increased thoracic kyphosis  PALPATION: TTP 2/10 at Left upper/lateral glute  LUMBAR ROM:   AROM Eval 01/07/2024 02/10/2024  Flexion WFL   Extension 50%   Right lateral flexion 50-75%   Left lateral flexion 50-75%   Right rotation 50%   Left rotation 50%     (Blank rows = not tested)  LOWER EXTREMITY ROM:     ROM Right Eval 01/07/2024 Left Eval 01/07/2024  Hip flexion [facility]   Hip extension    Hip abduction    Hip adduction    Hip internal rotation    Hip external rotation    Knee flexion    Knee extension    Ankle dorsiflexion  Ankle plantarflexion    Ankle inversion    Ankle eversion     (Blank rows = not tested)  LOWER EXTREMITY MMT:    MMT Right Eval 01/07/2024 Left Eval 01/07/2024 03/10/2024 04/03/2024  Hip flexion (seated) 5/5 4-/5    Hip extension (prone) 3-/5 3-/5    Hip abduction (sidelying) 3-/5 3-/5 Rt: 3+/5 Lt: 3+/5 Lt: 2+/5  Hip adduction      Hip internal rotation      Hip external rotation      Knee flexion (seated) 5/5 4-/5    Knee extension (seated) 5/5 4/5    Ankle dorsiflexion      Ankle plantarflexion      Ankle inversion      Ankle eversion       (Blank rows = not tested)  LUMBAR SPECIAL TESTS:  Straight leg raise test: Negative, Slump test: Negative, and FABER test: Positive on L, but due to past hip fracture Clonus: negative   FUNCTIONAL TESTS:  5 times sit to stand: 10.27s  performed with SBA 4 stance balance: normal and narrow 10s, unable to complete tandem, 4s Rt 5s Lt single leg with lateral LOB and appropriate stepping reactions ; performed with CGA   03/10/2024 5xSTS: 11.22s performed with SBA  4 stance balance: normal: 10s narrow: 10s tandem  Rt: 2s tandem LT: 3s Rt LE: 2s Lt LE: 2s   GAIT: Distance walked: not formally assessed  Assistive device utilized: None Level of assistance: SBA Comments: increased IR throughout gait with Left LE, antalgic gait pattern, Left lateral lean with Left stance  TREATMENT DATE:  05/07/2024 TherEx:  Nustep LE only level 3 for 6 minutes  Decreased time on machine today secondary to increased soreness/pain Sidelying clamshells with red TB 2x15 each side   Neuro Re-Ed:  Discussed performing staggered stance instead of tandem stance in order to decrease large challenge and slowly decreasing distance between feet once able to reach 30s with no UE support  Semitandem/staggered stance with horizontal head movements 1x12 each foot back with increased sway, but no LOB or UE use   Semitandem/staggered stance with vertical head movements 1x16 each foot back with increased sway, but no LOB or UE use  Typical/semi narrow stance with horizontal head movements with increased sway, but no LOB or UE use  Typical/semi narrow stance with vertical head movementswith increased sway, but no LOB or UE use  Lateral walking on foam pad with intermittent UE use on // bars 1x4 down and back with high levels of fatigue ; 1x2 down and back   04/30/2024 TherEx:  Nustep LE only level 3 for 8 minutes   TherAct:  Step ups with 4 step using mat table for UE support intermittently 2x10 each side  Lateral step ups with 4 step using mat table for UE support 2x10 each side  Step over and back with small hurdle using unilat UE for stability support 2x10 each LE  Lateral step over small hurdle using UE support on mat table for stability 2x20 steps   04/14/2024 TherEx:   UBE level 2 for 8 minutes  Single knee to chest 2x30s each side  Modified supine piriformis stretch Rt side only 2x30s  PT assist supine Lt piriformis stretch 1x30s, though discontinued secondary to pain levels  Supine bridges with blue TB around knees  2x12 with 2s hold  Supine clamshells 2x12 with 2s holds ; Rt LE doing most work without verbal cues   Manual:  STM with intermittent  IASTM with percussive device to Rt glutes and piriformis   04/03/2024 TherEx:  UBE level 2 for 8 minutes  Touch down squats to 18 arm chair with 5# 2x8  PT assessed Lt hip abduction against gravity, ambulation, and palpated Lt hip musculature  PT observed trendelemburg with Lt stance, lateral lean with Lt stance PT found patient to have trigger points in Lt glute med, glute max, glute min, and piriformis   Updated MMT for hip abduction noted above  PT found that back mobility did not change pain in Lt hip  In depth discussion on dry needling (handout provided with note on top about extra charge), massage/percussive device, STM with tennis ball, POC and next steps towards independent HEP, how prior pain/surgeries can lead to long term changes in muscle use with certain activities  03/26/2024 TherEx:  UBE with bilat LE level 2 for 7 minutes  Lateral step ups with 4 step and bilat UE use, 2x10 each side  Suitcase carry 1x115' in Lt hand then 1x90' in Rt hand  Unable to complete same distance secondary to fatigue and decrease in balance  Seated marches 1x20 marches, 2x20 with 2# ankle weights   TherAct:  Patient performed ascending and descending of lobby stairs with CGA and using reciprocal pattern and 1 handrail  Descending of stairs with no difficulty or pain noted  Ascending stairs with increase Rt IR, posterior lean, and heavy UE use on handrails to pull fwd  Bilat leg press 2x12 with 56# Unilat leg press 2x10 with 25# each side     PATIENT EDUCATION:  Education details: HEP, POC  Person educated: Patient Education method: Explanation, Demonstration, Tactile cues, Verbal cues, and Handouts Education comprehension: verbalized understanding, returned demonstration, verbal cues required, and tactile cues required  HOME EXERCISE PROGRAM: Access  Code: [email or web address] URL: [email or web address] Date: 01/31/2024 Prepared by: Susannah Daring  Exercises - Standing Hip Abduction with Counter Support  - 1 x daily - 7 x weekly - 2 sets - 10 reps - Standing Hip Extension with Counter Support  - 1 x daily - 7 x weekly - 2 sets - 10 reps - Seated Active Straight-Leg Raise  - 1 x daily - 7 x weekly - 2 sets - 10 reps - Heel Raises with Counter Support  - 1 x daily - 7 x weekly - 2 sets - 10 reps - Mini Squat with Counter Support  - 1 x daily - 7 x weekly - 2 sets - 10 reps - Standing Lumbar Extension at Wall - Forearms  - 5 x daily - 7 x weekly - 1 sets - 5 reps - 3 seconds hold - Standing Hip Hiking  - 5 x daily - 7 x weekly - 1 sets - 5 reps - 3 seconds hold - Prone Hip Extension  - 2 x daily - 7 x weekly - 1-2 sets - 10 reps - 3 seconds hold - Romberg Stance with Head Rotation  - 1 x daily - 7 x weekly - 2 sets - 10 reps - Romberg Stance with Eyes Closed  - 1 x daily - 7 x weekly - 2 sets - 10 reps - Step Up  - 1 x daily - 7 x weekly - 2 sets - 10 reps - Standing Forward Step Taps with Counter Support  - 1 x daily - 7 x weekly - 2 sets - 10 reps  ASSESSMENT:  CLINICAL IMPRESSION:  Patient arrived to session noting continued difficulty with discomfort/pain that  has not changed with PT interventions. Patient tolerated all activities this date and PT discussed slight changes to be made with balance activities performed at home. Patient will be following up with MD about deficits and pain levels. Patient will continue to benefit from skilled PT.   OBJECTIVE IMPAIRMENTS: Abnormal gait, decreased activity tolerance, decreased balance, decreased coordination, decreased endurance, decreased mobility, difficulty walking, decreased ROM, decreased strength, improper body mechanics, postural dysfunction, and pain.   ACTIVITY LIMITATIONS: carrying, lifting, and stairs  PARTICIPATION LIMITATIONS: meal prep, cleaning, laundry, community activity,  and caretaker duties  PERSONAL FACTORS: Time since onset of injury/illness/exacerbation and 3+ comorbidities: GERD, HTN, ulcerative colitis, Turner's syndrome, osteopenia, dyslipidemia are also affecting patient's functional outcome.   REHAB POTENTIAL: Good  CLINICAL DECISION MAKING: Evolving/moderate complexity  EVALUATION COMPLEXITY: Moderate   GOALS: Goals reviewed with patient? Yes  SHORT TERM GOALS: Target date: 01/28/2024  Patient will show compliance with initial HEP. Baseline: Goal status: Met 01/23/2024  2.  Patient will report pain levels no greater than 5/10 to show improved overall quality of life. Baseline:  Goal status: ongoing, 04/03/2024   LONG TERM GOALS: Target date: 06/12/2023  Patient will show independence with final HEP in order to maintain and progress upon functional gains made within PT. Baseline:  Goal status: ongoing, 04/03/2024  2.  Patient will report pain levels no greater than 3/10 to show improved overall quality of life. Baseline:  Goal status: ongoing, 04/03/2024  3.  Patient will increase PSFS to at least 4 to show significant improvement in subjective disability rating. Baseline:  Goal status: GOAL MET, 03/10/2024  4.  Patient will increase hip abduction MMT to at least 4-/5 in order to show improved biomechanics with functional mobility and gait.  Baseline:  Goal status: ongoing, 04/03/2024  5.  Patient will improve 4 stance balance in order to show increase in steadiness on feet.  Baseline:  Goal status: ongoing, 04/03/2024  PLAN:  PT FREQUENCY: 1-2x/week  PT DURATION: 8 weeks  PLANNED INTERVENTIONS: 97164- PT Re-evaluation, 97750- Physical Performance Testing, 97110-Therapeutic exercises, 97530- Therapeutic activity, [id number]- Neuromuscular re-education, 97535- Self Care, [id number]- Manual therapy, [phone]- Gait training, [phone]- Electrical stimulation (unattended), [phone]- Electrical stimulation (manual), 97016- Vasopneumatic device, [id number]-  Ultrasound, [id number]- Traction (mechanical), [id number]- Ionotophoresis 4mg /ml Dexamethasone , 79439 (1-2 muscles), [id number] (3+ muscles)- Dry Needling, Patient/Family education, Balance training, Stair training, Taping, Joint mobilization, Joint manipulation, Spinal manipulation, Spinal mobilization, Scar mobilization, Vestibular training, DME instructions, Cryotherapy, and Moist heat.  PLAN FOR NEXT SESSION:  stepping over hurdles, generalized LE strengthening, gait, balance, manual techniques (no dry needling)    Susannah Daring, PT, DPT 05/07/2024 10:20 AM

## 2024-05-07 ENCOUNTER — Encounter

## 2024-05-07 DIAGNOSIS — M25552 Pain in left hip: Secondary | ICD-10-CM | POA: Diagnosis not present

## 2024-05-07 DIAGNOSIS — M25652 Stiffness of left hip, not elsewhere classified: Secondary | ICD-10-CM

## 2024-05-07 DIAGNOSIS — R2681 Unsteadiness on feet: Secondary | ICD-10-CM

## 2024-05-07 DIAGNOSIS — R2689 Other abnormalities of gait and mobility: Secondary | ICD-10-CM

## 2024-05-07 DIAGNOSIS — M5459 Other low back pain: Secondary | ICD-10-CM | POA: Diagnosis not present

## 2024-05-07 DIAGNOSIS — M6281 Muscle weakness (generalized): Secondary | ICD-10-CM

## 2024-05-08 ENCOUNTER — Inpatient Hospital Stay

## 2024-05-08 ENCOUNTER — Inpatient Hospital Stay: Admitting: Hematology & Oncology

## 2024-05-08 ENCOUNTER — Other Ambulatory Visit: Payer: Self-pay

## 2024-05-08 ENCOUNTER — Encounter: Payer: Self-pay | Admitting: Hematology & Oncology

## 2024-05-08 VITALS — BP 124/67 | HR 75 | Temp 98.3°F | Resp 16 | Ht <= 58 in | Wt 156.0 lb

## 2024-05-08 DIAGNOSIS — D696 Thrombocytopenia, unspecified: Secondary | ICD-10-CM

## 2024-05-08 LAB — CBC WITH DIFFERENTIAL (CANCER CENTER ONLY)
Abs Immature Granulocytes: 0.26 K/uL — ABNORMAL HIGH (ref 0.00–0.07)
Basophils Absolute: 0.1 K/uL (ref 0.0–0.1)
Basophils Relative: 0 %
Eosinophils Absolute: 0.1 K/uL (ref 0.0–0.5)
Eosinophils Relative: 1 %
HCT: 40.7 % (ref 36.0–46.0)
Hemoglobin: 12.8 g/dL (ref 12.0–15.0)
Immature Granulocytes: 2 %
Lymphocytes Relative: 25 %
Lymphs Abs: 3 K/uL (ref 0.7–4.0)
MCH: 27.4 pg (ref 26.0–34.0)
MCHC: 31.4 g/dL (ref 30.0–36.0)
MCV: 87 fL (ref 80.0–100.0)
Monocytes Absolute: 3 K/uL — ABNORMAL HIGH (ref 0.1–1.0)
Monocytes Relative: 25 %
Neutro Abs: 5.4 K/uL (ref 1.7–7.7)
Neutrophils Relative %: 47 %
Platelet Count: 46 K/uL — ABNORMAL LOW (ref 150–400)
RBC: 4.68 MIL/uL (ref 3.87–5.11)
RDW: 15 % (ref 11.5–15.5)
Smear Review: NORMAL
WBC Count: 11.8 K/uL — ABNORMAL HIGH (ref 4.0–10.5)
nRBC: 0 % (ref 0.0–0.2)

## 2024-05-08 LAB — CMP (CANCER CENTER ONLY)
ALT: 29 U/L (ref 0–44)
AST: 32 U/L (ref 15–41)
Albumin: 4.7 g/dL (ref 3.5–5.0)
Alkaline Phosphatase: 77 U/L (ref 38–126)
Anion gap: 10 (ref 5–15)
BUN: 18 mg/dL (ref 8–23)
CO2: 28 mmol/L (ref 22–32)
Calcium: 9.6 mg/dL (ref 8.9–10.3)
Chloride: 104 mmol/L (ref 98–111)
Creatinine: 0.85 mg/dL (ref 0.44–1.00)
GFR, Estimated: >60 mL/min
Glucose, Bld: 108 mg/dL — ABNORMAL HIGH (ref 70–99)
Potassium: 4.4 mmol/L (ref 3.5–5.1)
Sodium: 142 mmol/L (ref 135–145)
Total Bilirubin: 0.7 mg/dL (ref 0.0–1.2)
Total Protein: 6.6 g/dL (ref 6.5–8.1)

## 2024-05-08 LAB — SAVE SMEAR(SSMR), FOR PROVIDER SLIDE REVIEW

## 2024-05-08 MED ORDER — ROMIPLOSTIM 125 MCG ~~LOC~~ SOLR
1.0000 ug/kg | Freq: Once | SUBCUTANEOUS | Status: AC
  Administered 2024-05-08: 70 ug via SUBCUTANEOUS
  Filled 2024-05-08: qty 0.14

## 2024-05-08 NOTE — Patient Instructions (Signed)
 Romiplostim Injection What is this medication? ROMIPLOSTIM (roe mi PLOE stim) treats low levels of platelets in your body caused by immune thrombocytopenia (ITP). It is prescribed when other medications have not worked or cannot be tolerated. It may also be used to help people who have been exposed to high doses of radiation. It works by increasing the amount of platelets in your blood. This lowers the risk of bleeding. This medicine may be used for other purposes; ask your health care provider or pharmacist if you have questions. COMMON BRAND NAME(S): Nplate What should I tell my care team before I take this medication? They need to know if you have any of these conditions: Blood clots Myelodysplastic syndrome An unusual or allergic reaction to romiplostim, mannitol, other medications, foods, dyes, or preservatives Pregnant or trying to get pregnant Breast-feeding How should I use this medication? This medication is injected under the skin. It is given by a care team in a hospital or clinic setting. A special MedGuide will be given to you before each treatment. Be sure to read this information carefully each time. Talk to your care team about the use of this medication in children. While it may be prescribed for children as young as newborns for selected conditions, precautions do apply. Overdosage: If you think you have taken too much of this medicine contact a poison control center or emergency room at once. NOTE: This medicine is only for you. Do not share this medicine with others. What if I miss a dose? Keep appointments for follow-up doses. It is important not to miss your dose. Call your care team if you are unable to keep an appointment. What may interact with this medication? Interactions are not expected. This list may not describe all possible interactions. Give your health care provider a list of all the medicines, herbs, non-prescription drugs, or dietary supplements you use. Also  tell them if you smoke, drink alcohol, or use illegal drugs. Some items may interact with your medicine. What should I watch for while using this medication? Visit your care team for regular checks on your progress. You may need blood work done while you are taking this medication. Your condition will be monitored carefully while you are receiving this medication. It is important not to miss any appointments. What side effects may I notice from receiving this medication? Side effects that you should report to your care team as soon as possible: Allergic reactions--skin rash, itching, hives, swelling of the face, lips, tongue, or throat Blood clot--pain, swelling, or warmth in the leg, shortness of breath, chest pain Side effects that usually do not require medical attention (report to your care team if they continue or are bothersome): Dizziness Joint pain Muscle pain Pain in the hands or feet Stomach pain Trouble sleeping This list may not describe all possible side effects. Call your doctor for medical advice about side effects. You may report side effects to FDA at 1-800-FDA-1088. Where should I keep my medication? This medication is given in a hospital or clinic. It will not be stored at home. NOTE: This sheet is a summary. It may not cover all possible information. If you have questions about this medicine, talk to your doctor, pharmacist, or health care provider.  2024 Elsevier/Gold Standard (2021-09-11 00:00:00)

## 2024-05-08 NOTE — Progress Notes (Signed)
 " Hematology and Oncology Follow Up Visit  [name] [id number] [birth year] 68 y.o. 05/08/2024   Principle Diagnosis:  Thrombocytopenia-possible ITP versus MDS -- NGS shows BCOR, TET2 & ZRSR  Current Therapy:   Nplate -q. weekly to keep platelet count above 100,000     Interim History:  [name] is back for her follow-up.  Thankfully, we did not have to give her Nplate  last time that was our as her platelet count was up to 279,000.  She did get checked for a thoracentesis.  Thankfully, there was not a fluid in the lung to have a thoracentesis.  Otherwise, everything is going quite well.  She and her family will have their Hanukkah celebration this weekend.  I know this is very important for her.  She has had no problems with bleeding or bruising.  She has had no cough or shortness of breath.  She has had no fever.  She has had no change in bowel or bladder habits.  There is been no rashes.  She has had no leg swelling.  Overall, I would have to say that her performance status is probably ECOG 1.   Medications:  Current Outpatient Medications:    alendronate  (FOSAMAX ) 70 MG tablet, Take 70 mg by mouth once a week., Disp: , Rfl:    Calcium  Polycarbophil (FIBER-CAPS PO), Take 3 tablets by mouth daily at 6 (six) AM., Disp: , Rfl:    celecoxib  (CELEBREX ) 100 MG capsule, Take 100 mg by mouth as needed for mild pain (pain score 1-3) or moderate pain (pain score 4-6)., Disp: , Rfl:    Docusate Sodium  (COLACE PO), Take 3 capsules by mouth daily at 6 (six) AM., Disp: , Rfl:    lisinopril  (ZESTRIL ) 40 MG tablet, TAKE 1 TABLET BY MOUTH EVERY DAY, Disp: 90 tablet, Rfl: 1   metoprolol  succinate (TOPROL -XL) 25 MG 24 hr tablet, TAKE 1 TABLET (25 MG TOTAL) BY MOUTH DAILY., Disp: 90 tablet, Rfl: 1   rosuvastatin  (CRESTOR ) 20 MG tablet, TAKE 1 TABLET BY MOUTH DAILY. GENERIC EQUIVALENT FOR CRESTOR ., Disp: 90 tablet, Rfl: 1   traZODone  (DESYREL ) 50 MG tablet, TAKE ONE-HALF TO ONE TABLET BY MOUTH AT  BEDTIME AS NEED FOR SLEEP, Disp: 90 tablet, Rfl: 1   VITAMIN D  PO, Take 2,000 Units by mouth daily., Disp: , Rfl:   Allergies:  Allergies  Allergen Reactions   Prednisone  Nausea And Vomiting and Other (See Comments)     headache, dropping platelets    Past Medical History, Surgical history, Social history, and Family History were reviewed and updated.  Review of Systems: Review of Systems  Constitutional:  Positive for fatigue.  HENT:  Negative.    Eyes: Negative.   Respiratory: Negative.    Cardiovascular: Negative.   Gastrointestinal: Negative.   Endocrine: Negative.   Genitourinary: Negative.    Musculoskeletal: Negative.   Skin: Negative.   Neurological: Negative.   Hematological: Negative.   Psychiatric/Behavioral: Negative.      Physical Exam:  Vital signs show temperature 98.3.  Pulse 75.  Blood pressure 124/67.  Weight is 156 pounds.  Wt Readings from Last 3 Encounters:  04/23/24 155 lb (70.3 kg)  04/15/24 156 lb (70.8 kg)  04/07/24 154 lb (69.9 kg)    Physical Exam Vitals reviewed.  HENT:     Head: Normocephalic and atraumatic.  Eyes:     Pupils: Pupils are equal, round, and reactive to light.  Cardiovascular:     Rate and Rhythm: Normal rate and regular rhythm.  Heart sounds: Normal heart sounds.  Pulmonary:     Effort: Pulmonary effort is normal.     Breath sounds: Normal breath sounds.  Abdominal:     General: Bowel sounds are normal.     Palpations: Abdomen is soft.  Musculoskeletal:        General: No tenderness or deformity. Normal range of motion.     Cervical back: Normal range of motion.  Lymphadenopathy:     Cervical: No cervical adenopathy.  Skin:    General: Skin is warm and dry.     Findings: No erythema or rash.  Neurological:     Mental Status: She is alert and oriented to person, place, and time.  Psychiatric:        Behavior: Behavior normal.        Thought Content: Thought content normal.        Judgment: Judgment  normal.      Lab Results  Component Value Date   WBC 12.6 (H) 04/23/2024   HGB 12.8 04/23/2024   HCT 40.7 04/23/2024   MCV 87.0 04/23/2024   PLT 179 04/23/2024     Chemistry      Component Value Date/Time   NA 143 04/23/2024 1021   NA 137 11/29/2023 1545   K 4.5 04/23/2024 1021   CL 106 04/23/2024 1021   CO2 25 04/23/2024 1021   BUN 17 04/23/2024 1021   BUN 13 11/29/2023 1545   CREATININE 0.89 04/23/2024 1021      Component Value Date/Time   CALCIUM  9.8 04/23/2024 1021   ALKPHOS 68 04/23/2024 1021   AST 22 04/23/2024 1021   ALT 19 04/23/2024 1021   BILITOT 0.7 04/23/2024 1021     Normochromic normocytic population of red blood cells.  She has no nucleated red blood cells.  I see no teardrop cells.  She has no rouleaux formation.  There is no schistocytes or spherocytes.  White blood cells appear normal in morphology and maturation.  She may have a slight increase in neutrophils.  I do not see any immature myeloid or lymphoid cells.  Platelets are adequate in number.  She may have a rare large platelet.  Impression and Plan:  [name] is a very nice 68 year old white female.  She has a history of Turner's syndrome.  She has thrombocytopenia.  Again, it is not clear as to the exact etiology.  I have a feeling that the place of an IV load today.  She last got a Nplate  back about 3 weeks ago.  Again, I do believe that we might be looking at a form of ITP.  I am sure that we will have to give her Nplate  today.  Given her low platelets, we may have to get her back a little bit more often.  [name], MD 12/19/20258:03 AM "

## 2024-05-11 NOTE — Therapy (Signed)
 " OUTPATIENT PHYSICAL THERAPY THORACOLUMBAR TREATMENT  Patient Name: [name] MRN: [id number] DOB:1955/07/16, 68 y.o., female Today's Date: 05/12/2024    END OF SESSION:  PT End of Session - 05/12/24 0930     Visit Number 14    Number of Visits 18    Date for Recertification  06/11/24    Authorization Type BC[employer] MEDICARE $20 COPAY    Progress Note Due on Visit 20    PT Start Time 0931    PT Stop Time 1012    PT Time Calculation (min) 41 min    Activity Tolerance Patient tolerated treatment well    Behavior During Therapy [facility] for tasks assessed/performed            Past Medical History:  Diagnosis Date   B12 DEFICIENCY 02/09/2009   Qualifier: Diagnosis of  By: Joshua RN, CGRN, Sheri     Blood in stool    COLONIC POLYPS, HYPERPLASTIC, HX OF 02/03/2009   Qualifier: Diagnosis of  By: Surface RN, Arland MENDS, COLON 02/03/2009   Qualifier: Diagnosis of  By: Surface RN, Donna     Dyslipidemia 06/20/2012   Fracture, intertrochanteric, left femur (HCC) 05/02/2012   GERD (gastroesophageal reflux disease)    Hearing loss    Bil/has hearing aids   Hemorrhoids    HEMORRHOIDS 02/03/2009   Qualifier: Diagnosis of  By: Surface RN, Arland     Hypertension    INFLAMMATORY BOWEL DISEASE - Followed by Dr. Jakie in GI 02/03/2009   Qualifier: Diagnosis of  By: Surface RN, Arland Bristle    Turner's syndrome    was on Provera and Premarin and d/c this  at age 10yr   Ulcerative colitis    Past Surgical History:  Procedure Laterality Date   COLONOSCOPY  last 03/25/2013   HERNIA REPAIR  2009   lt ing    INCISION AND DRAINAGE  2004    lt thumb dog bite   INTRAMEDULLARY (IM) NAIL INTERTROCHANTERIC  05/02/2012   Procedure: INTRAMEDULLARY (IM) NAIL INTERTROCHANTRIC;  Surgeon: Fonda SHAUNNA Olmsted, MD;  Location: [facility] OR;  Service: Orthopedics;  Laterality: Left;   ORIF FINGER FRACTURE  06/14/2011   Procedure: OPEN REDUCTION INTERNAL FIXATION (ORIF) METACARPAL (FINGER)  FRACTURE;  Surgeon: Franky JONELLE Curia, MD;  Location: [facility];  Service: Orthopedics;  Laterality: Right;  right ring   POLYPECTOMY     TONSILLECTOMY     UPPER GASTROINTESTINAL ENDOSCOPY     Patient Active Problem List   Diagnosis Date Noted   Hyponatremia 10/19/2023   Thrombocytopenia 10/19/2023   Acute leg pain, left 07/19/2022   Osteopenia/?Osteoporosis - followed by her gynecologist, Dr. Kandyce 06/20/2012   Dyslipidemia 06/20/2012   H/O Turner syndrome 05/02/2012   Essential hypertension 02/03/2009   INFLAMMATORY BOWEL DISEASE - Followed by GI 02/03/2009   History of colonic polyps 02/03/2009    PCP: Heron CHRISTELLA Sharper, MD   REFERRING PROVIDER: [name], MD  REFERRING DIAG: M54.50 (ICD-10-CM) - Lumbar pain  Rationale for Evaluation and Treatment: Rehabilitation  THERAPY DIAG:  Other low back pain  Muscle weakness (generalized)  Pain in left hip  Stiffness of left hip, not elsewhere classified  Unsteadiness on feet  Other abnormalities of gait and mobility  ONSET DATE: 4 years ago when it started; 2 years it has gotten worse   SUBJECTIVE:  SUBJECTIVE STATEMENT: Patient reports that she was unable to call the MD, but still hasn't felt a difference.    PERTINENT HISTORY:  Patient states she experienced a femur fracture in 2013 from a fall at work, but has not had any other injuries or surgeries to the area. Patient received a cortisone shot for Left hip pain then went to ER 3 days as she started having slurred speech, balance issues, and ambulation deficits. After patient was released, she went to Dr. Burnetta and received a prednisone  taper pack which assisted her pain, but she had withdrawal-like symptoms after completing. Patient also noted she is having platelet  issues and is seeing a hematologist.  PAIN:  Are you having pain? Yes: NPRS scale: 6/10   Pain location: left lower back and down left LE  Pain description: burning Aggravating factors: walking Relieving factors: sitting/resting  PRECAUTIONS: None  RED FLAGS: Noting some bladder incontinence, but being followed by OB/GYN for that    WEIGHT BEARING RESTRICTIONS: No  FALLS:  Has patient fallen in last 6 months? 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  DIAGNOSTIC FINDINGS:  XRAY: showing disc at loss at L4/5 and L5/S1.  No evidence of  instability on flexion/extension views.  No fracture or dislocation seen.   MRI IMPRESSION: 1. Right eccentric disc bulge and facet arthropathy at L4-L5 resulting in moderate right neural foraminal narrowing. 2. Disc bulge at L5-S1 with lateral disc contacting the exited left L5 nerve root in the extraforaminal zone.  PATIENT SURVEYS:  PSFS: THE PATIENT SPECIFIC FUNCTIONAL SCALE  Place score of 0-10 (0 = unable to perform activity and 10 = able to perform activity at the same level as before injury or problem)  Activity Date: 01/07/2024 02/10/2024 03/10/2024   Balance   1 5 7   2. Walking   3 6 6   3.     4.      Total Score 2 5.5 6.5    Total Score = Sum of activity scores/number of activities  Minimally Detectable Change: 3 points (for single activity); 2 points (for average score)  [facility] (nd). The Patient Specific Functional Scale . Retrieved  from [email or web address]   COGNITION: Overall cognitive status: Within functional limits for tasks assessed     SENSATION: Light touch: WFL  MUSCLE LENGTH: Not assessed on eval  POSTURE: rounded shoulders, forward head, and increased thoracic kyphosis  PALPATION: TTP 2/10 at Left upper/lateral glute  LUMBAR ROM:   AROM Eval 01/07/2024 02/10/2024  Flexion WFL   Extension 50%   Right lateral flexion 50-75%   Left lateral flexion 50-75%   Right rotation 50%   Left rotation 50%     (Blank rows = not tested)  LOWER EXTREMITY ROM:     ROM Right Eval 01/07/2024 Left Eval 01/07/2024  Hip flexion [facility]   Hip extension    Hip abduction    Hip adduction    Hip internal rotation    Hip external rotation    Knee flexion    Knee extension  Ankle dorsiflexion    Ankle plantarflexion    Ankle inversion    Ankle eversion     (Blank rows = not tested)  LOWER EXTREMITY MMT:    MMT Right Eval 01/07/2024 Left Eval 01/07/2024 03/10/2024 04/03/2024  Hip flexion (seated) 5/5 4-/5    Hip extension (prone) 3-/5 3-/5    Hip abduction (sidelying) 3-/5 3-/5 Rt: 3+/5 Lt: 3+/5 Lt: 2+/5  Hip adduction      Hip internal rotation      Hip external rotation      Knee flexion (seated) 5/5 4-/5    Knee extension (seated) 5/5 4/5    Ankle dorsiflexion      Ankle plantarflexion      Ankle inversion      Ankle eversion       (Blank rows = not tested)  LUMBAR SPECIAL TESTS:  Straight leg raise test: Negative, Slump test: Negative, and FABER test: Positive on L, but due to past hip fracture Clonus: negative   FUNCTIONAL TESTS:  5 times sit to stand: 10.27s  performed with SBA 4 stance balance: normal and narrow 10s, unable to complete tandem, 4s Rt 5s Lt single leg with lateral LOB and appropriate stepping reactions ; performed with CGA   03/10/2024 5xSTS: 11.22s performed with SBA  4 stance balance: normal: 10s narrow: 10s  tandem Rt: 2s tandem LT: 3s Rt LE: 2s Lt LE: 2s   GAIT: Distance walked: not formally assessed  Assistive device utilized: None Level of assistance: SBA Comments: increased IR throughout gait with Left LE, antalgic gait pattern, Left lateral lean with Left stance  TREATMENT DATE:  05/12/2024 TherEx:  Nustep LE only level 3 for 8 minutes  Sidelying clamshells 2x12 with 2s holds each side with green TB  Hooklying bridge with yellow ball between knees 2x10 with 2s holds  Fwd step ups with 4 steps 2x10 each side with unilat UE support (mainly fingertips for stability) Lateral step ups with 4 step 1x10 each side with unilat UE support (mainly fingertips for stability) Minimal increases in pain in Lt LE following this activity, improved with rest   Neuro Re-Ed:  Rocker board fwd/back with UE support 1x20 taps  Rocker board lateral with UE support 1x20 taps  Patient reliant on UE support to appropriately perform above rocker board activities  PT discussed semi-tandem/staggered stance at home and how to increase progression slowly instead of going from staggered straight to tandem    05/07/2024 TherEx:  Nustep LE only level 3 for 6 minutes  Decreased time on machine today secondary to increased soreness/pain Sidelying clamshells with red TB 2x15 each side   Neuro Re-Ed:  Discussed performing staggered stance instead of tandem stance in order to decrease large challenge and slowly decreasing distance between feet once able to reach 30s with no UE support  Semitandem/staggered stance with horizontal head movements 1x12 each foot back with increased sway, but no LOB or UE use   Semitandem/staggered stance with vertical head movements 1x16 each foot back with increased sway, but no LOB or UE use  Typical/semi narrow stance with horizontal head movements with increased sway, but no LOB or UE use  Typical/semi narrow stance with vertical head movementswith increased sway, but no LOB or UE use   Lateral walking on foam pad with intermittent UE use on // bars 1x4 down and back with high levels of fatigue ; 1x2 down and back   04/30/2024 TherEx:  Nustep LE only level 3 for 8  minutes   TherAct:  Step ups with 4 step using mat table for UE support intermittently 2x10 each side  Lateral step ups with 4 step using mat table for UE support 2x10 each side  Step over and back with small hurdle using unilat UE for stability support 2x10 each LE  Lateral step over small hurdle using UE support on mat table for stability 2x20 steps   04/14/2024 TherEx:   UBE level 2 for 8 minutes  Single knee to chest 2x30s each side  Modified supine piriformis stretch Rt side only 2x30s  PT assist supine Lt piriformis stretch 1x30s, though discontinued secondary to pain levels  Supine bridges with blue TB around knees 2x12 with 2s hold  Supine clamshells 2x12 with 2s holds ; Rt LE doing most work without verbal cues   Manual:  STM with intermittent IASTM with percussive device to Rt glutes and piriformis   04/03/2024 TherEx:  UBE level 2 for 8 minutes  Touch down squats to 18 arm chair with 5# 2x8  PT assessed Lt hip abduction against gravity, ambulation, and palpated Lt hip musculature  PT observed trendelemburg with Lt stance, lateral lean with Lt stance PT found patient to have trigger points in Lt glute med, glute max, glute min, and piriformis   Updated MMT for hip abduction noted above  PT found that back mobility did not change pain in Lt hip  In depth discussion on dry needling (handout provided with note on top about extra charge), massage/percussive device, STM with tennis ball, POC and next steps towards independent HEP, how prior pain/surgeries can lead to long term changes in muscle use with certain activities   PATIENT EDUCATION:  Education details: HEP, POC  Person educated: Patient Education method: Explanation, Demonstration, Tactile cues, Verbal cues, and  Handouts Education comprehension: verbalized understanding, returned demonstration, verbal cues required, and tactile cues required  HOME EXERCISE PROGRAM: Access Code: [email or web address] URL: [email or web address] Date: 01/31/2024 Prepared by: Susannah Daring  Exercises - Standing Hip Abduction with Counter Support  - 1 x daily - 7 x weekly - 2 sets - 10 reps - Standing Hip Extension with Counter Support  - 1 x daily - 7 x weekly - 2 sets - 10 reps - Seated Active Straight-Leg Raise  - 1 x daily - 7 x weekly - 2 sets - 10 reps - Heel Raises with Counter Support  - 1 x daily - 7 x weekly - 2 sets - 10 reps - Mini Squat with Counter Support  - 1 x daily - 7 x weekly - 2 sets - 10 reps - Standing Lumbar Extension at Wall - Forearms  - 5 x daily - 7 x weekly - 1 sets - 5 reps - 3 seconds hold - Standing Hip Hiking  - 5 x daily - 7 x weekly - 1 sets - 5 reps - 3 seconds hold - Prone Hip Extension  - 2 x daily - 7 x weekly - 1-2 sets - 10 reps - 3 seconds hold - Romberg Stance with Head Rotation  - 1 x daily - 7 x weekly - 2 sets - 10 reps - Romberg Stance with Eyes Closed  - 1 x daily - 7 x weekly - 2 sets - 10 reps - Step Up  - 1 x daily - 7 x weekly - 2 sets - 10 reps - Standing Forward Step Taps with Counter Support  - 1 x daily - 7 x weekly - 2 sets -  10 reps  ASSESSMENT:  CLINICAL IMPRESSION:  Patient arrived to session noting no change in pain and will try to reach out to MD soon as she hasn't been able to do that quite yet. Patient tolerated all activities this date, though had minimal increase in pain with lateral step ups. Patient also heavily reliant on UE when challenging more dynamic balance concepts. Patient will continue to benefit from skilled PT.   OBJECTIVE IMPAIRMENTS: Abnormal gait, decreased activity tolerance, decreased balance, decreased coordination, decreased endurance, decreased mobility, difficulty walking, decreased ROM, decreased strength, improper body  mechanics, postural dysfunction, and pain.   ACTIVITY LIMITATIONS: carrying, lifting, and stairs  PARTICIPATION LIMITATIONS: meal prep, cleaning, laundry, community activity, and caretaker duties  PERSONAL FACTORS: Time since onset of injury/illness/exacerbation and 3+ comorbidities: GERD, HTN, ulcerative colitis, Turner's syndrome, osteopenia, dyslipidemia are also affecting patient's functional outcome.   REHAB POTENTIAL: Good  CLINICAL DECISION MAKING: Evolving/moderate complexity  EVALUATION COMPLEXITY: Moderate   GOALS: Goals reviewed with patient? Yes  SHORT TERM GOALS: Target date: 01/28/2024  Patient will show compliance with initial HEP. Baseline: Goal status: Met 01/23/2024  2.  Patient will report pain levels no greater than 5/10 to show improved overall quality of life. Baseline:  Goal status: ongoing, 04/03/2024   LONG TERM GOALS: Target date: 06/12/2023  Patient will show independence with final HEP in order to maintain and progress upon functional gains made within PT. Baseline:  Goal status: ongoing, 04/03/2024  2.  Patient will report pain levels no greater than 3/10 to show improved overall quality of life. Baseline:  Goal status: ongoing, 04/03/2024  3.  Patient will increase PSFS to at least 4 to show significant improvement in subjective disability rating. Baseline:  Goal status: GOAL MET, 03/10/2024  4.  Patient will increase hip abduction MMT to at least 4-/5 in order to show improved biomechanics with functional mobility and gait.  Baseline:  Goal status: ongoing, 04/03/2024  5.  Patient will improve 4 stance balance in order to show increase in steadiness on feet.  Baseline:  Goal status: ongoing, 04/03/2024  PLAN:  PT FREQUENCY: 1-2x/week  PT DURATION: 8 weeks  PLANNED INTERVENTIONS: 97164- PT Re-evaluation, 97750- Physical Performance Testing, 97110-Therapeutic exercises, 97530- Therapeutic activity, 97112- Neuromuscular re-education,  97535- Self Care, [id number]- Manual therapy, [phone]- Gait training, [phone]- Electrical stimulation (unattended), [phone]- Electrical stimulation (manual), 97016- Vasopneumatic device, [id number]- Ultrasound, [id number]- Traction (mechanical), [id number]- Ionotophoresis 4mg /ml Dexamethasone , 79439 (1-2 muscles), [id number] (3+ muscles)- Dry Needling, Patient/Family education, Balance training, Stair training, Taping, Joint mobilization, Joint manipulation, Spinal manipulation, Spinal mobilization, Scar mobilization, Vestibular training, DME instructions, Cryotherapy, and Moist heat.  PLAN FOR NEXT SESSION:   stepping over hurdles, generalized LE strengthening, gait, balance, manual techniques (no dry needling)    Susannah Daring, PT, DPT 05/12/2024 10:16 AM     "

## 2024-05-12 ENCOUNTER — Ambulatory Visit (INDEPENDENT_AMBULATORY_CARE_PROVIDER_SITE_OTHER)

## 2024-05-12 DIAGNOSIS — M6281 Muscle weakness (generalized): Secondary | ICD-10-CM

## 2024-05-12 DIAGNOSIS — M5459 Other low back pain: Secondary | ICD-10-CM | POA: Diagnosis not present

## 2024-05-12 DIAGNOSIS — M25652 Stiffness of left hip, not elsewhere classified: Secondary | ICD-10-CM | POA: Diagnosis not present

## 2024-05-12 DIAGNOSIS — R2681 Unsteadiness on feet: Secondary | ICD-10-CM | POA: Diagnosis not present

## 2024-05-12 DIAGNOSIS — R2689 Other abnormalities of gait and mobility: Secondary | ICD-10-CM

## 2024-05-12 DIAGNOSIS — M25552 Pain in left hip: Secondary | ICD-10-CM

## 2024-05-18 ENCOUNTER — Inpatient Hospital Stay

## 2024-05-18 VITALS — BP 106/68 | HR 90 | Temp 97.6°F | Resp 18 | Wt 158.0 lb

## 2024-05-18 DIAGNOSIS — D696 Thrombocytopenia, unspecified: Secondary | ICD-10-CM

## 2024-05-18 MED ORDER — ROMIPLOSTIM 125 MCG ~~LOC~~ SOLR
1.0000 ug/kg | Freq: Once | SUBCUTANEOUS | Status: AC
  Administered 2024-05-18: 70 ug via SUBCUTANEOUS
  Filled 2024-05-18: qty 0.14

## 2024-05-18 NOTE — Patient Instructions (Signed)
 Romiplostim Injection What is this medication? ROMIPLOSTIM (roe mi PLOE stim) treats low levels of platelets in your body caused by immune thrombocytopenia (ITP). It is prescribed when other medications have not worked or cannot be tolerated. It may also be used to help people who have been exposed to high doses of radiation. It works by increasing the amount of platelets in your blood. This lowers the risk of bleeding. This medicine may be used for other purposes; ask your health care provider or pharmacist if you have questions. COMMON BRAND NAME(S): Nplate What should I tell my care team before I take this medication? They need to know if you have any of these conditions: Blood clots Myelodysplastic syndrome An unusual or allergic reaction to romiplostim, mannitol, other medications, foods, dyes, or preservatives Pregnant or trying to get pregnant Breast-feeding How should I use this medication? This medication is injected under the skin. It is given by a care team in a hospital or clinic setting. A special MedGuide will be given to you before each treatment. Be sure to read this information carefully each time. Talk to your care team about the use of this medication in children. While it may be prescribed for children as young as newborns for selected conditions, precautions do apply. Overdosage: If you think you have taken too much of this medicine contact a poison control center or emergency room at once. NOTE: This medicine is only for you. Do not share this medicine with others. What if I miss a dose? Keep appointments for follow-up doses. It is important not to miss your dose. Call your care team if you are unable to keep an appointment. What may interact with this medication? Interactions are not expected. This list may not describe all possible interactions. Give your health care provider a list of all the medicines, herbs, non-prescription drugs, or dietary supplements you use. Also  tell them if you smoke, drink alcohol, or use illegal drugs. Some items may interact with your medicine. What should I watch for while using this medication? Visit your care team for regular checks on your progress. You may need blood work done while you are taking this medication. Your condition will be monitored carefully while you are receiving this medication. It is important not to miss any appointments. What side effects may I notice from receiving this medication? Side effects that you should report to your care team as soon as possible: Allergic reactions--skin rash, itching, hives, swelling of the face, lips, tongue, or throat Blood clot--pain, swelling, or warmth in the leg, shortness of breath, chest pain Side effects that usually do not require medical attention (report to your care team if they continue or are bothersome): Dizziness Joint pain Muscle pain Pain in the hands or feet Stomach pain Trouble sleeping This list may not describe all possible side effects. Call your doctor for medical advice about side effects. You may report side effects to FDA at 1-800-FDA-1088. Where should I keep my medication? This medication is given in a hospital or clinic. It will not be stored at home. NOTE: This sheet is a summary. It may not cover all possible information. If you have questions about this medicine, talk to your doctor, pharmacist, or health care provider.  2024 Elsevier/Gold Standard (2021-09-11 00:00:00)

## 2024-05-19 ENCOUNTER — Encounter: Payer: Self-pay | Admitting: Rehabilitative and Restorative Service Providers"

## 2024-05-19 ENCOUNTER — Ambulatory Visit: Admitting: Rehabilitative and Restorative Service Providers"

## 2024-05-19 DIAGNOSIS — M6281 Muscle weakness (generalized): Secondary | ICD-10-CM

## 2024-05-19 DIAGNOSIS — M25552 Pain in left hip: Secondary | ICD-10-CM

## 2024-05-19 DIAGNOSIS — R2681 Unsteadiness on feet: Secondary | ICD-10-CM

## 2024-05-19 DIAGNOSIS — R2689 Other abnormalities of gait and mobility: Secondary | ICD-10-CM | POA: Diagnosis not present

## 2024-05-19 DIAGNOSIS — M25652 Stiffness of left hip, not elsewhere classified: Secondary | ICD-10-CM | POA: Diagnosis not present

## 2024-05-19 DIAGNOSIS — M5459 Other low back pain: Secondary | ICD-10-CM | POA: Diagnosis not present

## 2024-05-19 NOTE — Therapy (Signed)
 " OUTPATIENT PHYSICAL THERAPY THORACOLUMBAR TREATMENT   Patient Name: [name] MRN: [id number] DOB:1955/08/05, 68 y.o., female Today's Date: 05/19/2024  END OF SESSION:  PT End of Session - 05/19/24 0934     Visit Number 15    Number of Visits 18    Date for Recertification  06/11/24    Authorization Type [employer] MEDICARE $20 COPAY    Progress Note Due on Visit 20    PT Start Time [phone]    Activity Tolerance Patient tolerated treatment well    Behavior During Therapy [facility] for tasks assessed/performed           Past Medical History:  Diagnosis Date   B12 DEFICIENCY 02/09/2009   Qualifier: Diagnosis of  By: Joshua RN, CGRN, Sheri     Blood in stool    COLONIC POLYPS, HYPERPLASTIC, HX OF 02/03/2009   Qualifier: Diagnosis of  By: Surface RN, Arland MENDS, COLON 02/03/2009   Qualifier: Diagnosis of  By: Melinda RN, Donna     Dyslipidemia 06/20/2012   Fracture, intertrochanteric, left femur (HCC) 05/02/2012   GERD (gastroesophageal reflux disease)    Hearing loss    Bil/has hearing aids   Hemorrhoids    HEMORRHOIDS 02/03/2009   Qualifier: Diagnosis of  By: Surface RN, Arland     Hypertension    INFLAMMATORY BOWEL DISEASE - Followed by Dr. Jakie in GI 02/03/2009   Qualifier: Diagnosis of  By: Surface RN, Arland Bristle    Turner's syndrome    was on Provera and Premarin and d/c this  at age 42yr   Ulcerative colitis    Past Surgical History:  Procedure Laterality Date   COLONOSCOPY  last 03/25/2013   HERNIA REPAIR  2009   lt ing    INCISION AND DRAINAGE  2004    lt thumb dog bite   INTRAMEDULLARY (IM) NAIL INTERTROCHANTERIC  05/02/2012   Procedure: INTRAMEDULLARY (IM) NAIL INTERTROCHANTRIC;  Surgeon: Fonda SHAUNNA Olmsted, MD;  Location: [facility] OR;  Service: Orthopedics;  Laterality: Left;   ORIF FINGER FRACTURE  06/14/2011   Procedure: OPEN REDUCTION INTERNAL FIXATION (ORIF) METACARPAL (FINGER) FRACTURE;  Surgeon: Franky JONELLE Curia, MD;  Location: [facility];  Service: Orthopedics;  Laterality: Right;  right ring   POLYPECTOMY     TONSILLECTOMY     UPPER GASTROINTESTINAL ENDOSCOPY     Patient Active Problem List   Diagnosis Date Noted   Hyponatremia 10/19/2023   Thrombocytopenia 10/19/2023   Acute leg pain, left 07/19/2022   Osteopenia/?Osteoporosis - followed by her gynecologist, Dr. Kandyce 06/20/2012   Dyslipidemia 06/20/2012   H/O Turner syndrome 05/02/2012   Essential hypertension 02/03/2009   INFLAMMATORY BOWEL DISEASE - Followed by GI 02/03/2009   History of colonic polyps 02/03/2009    PCP: Heron CHRISTELLA Sharper, MD   REFERRING PROVIDER: [name], MD  REFERRING DIAG: M54.50 (ICD-10-CM) - Lumbar pain  Rationale for Evaluation and Treatment: Rehabilitation  THERAPY DIAG:  Other low back pain  Muscle weakness (generalized)  Pain in left hip  Stiffness of left hip, not elsewhere classified  Unsteadiness on feet  Other abnormalities of gait and mobility  ONSET DATE: 4 years ago when it started; 2 years it has gotten worse   SUBJECTIVE:  SUBJECTIVE STATEMENT: Mychael notes difficulty with standing and walking.  She does much better with a shopping cart or baby carriage with standing and walking due to the upper extremity support.  Sitting, she is fine, although we discussed it is better for her to change positions frequently during the day and use a lumbar support when sitting.  PERTINENT HISTORY:  Patient states she experienced a femur fracture in 2013 from a fall at work, but has not had any other injuries or surgeries to the area. Patient received a cortisone shot for Left hip pain then went to ER 3 days as she started having slurred speech, balance issues, and ambulation deficits. After patient was released, she went to Dr.  Burnetta and received a prednisone  taper pack which assisted her pain, but she had withdrawal-like symptoms after completing. Patient also noted she is having platelet issues and is seeing a hematologist.  PAIN:  Are you having pain? Yes: NPRS scale: 0/10 at rest, 6/10 at worst  Pain location: Left LE  Pain description: burning Aggravating factors: walking Relieving factors: sitting/resting  PRECAUTIONS: None  RED FLAGS: Noting some bladder incontinence, but being followed by OB/GYN for that    WEIGHT BEARING RESTRICTIONS: No  FALLS:  Has patient fallen in last 6 months? 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  DIAGNOSTIC FINDINGS:  XRAY: showing disc at loss at L4/5 and L5/S1.  No evidence of  instability on flexion/extension views.  No fracture or dislocation seen.   MRI IMPRESSION: 1. Right eccentric disc bulge and facet arthropathy at L4-L5 resulting in moderate right neural foraminal narrowing. 2. Disc bulge at L5-S1 with lateral disc contacting the exited left L5 nerve root in the extraforaminal zone.  PATIENT SURVEYS:  PSFS: THE PATIENT SPECIFIC FUNCTIONAL SCALE  Place score of 0-10 (0 = unable to perform activity and 10 = able to perform activity at the same level as before injury or problem)  Activity Date: 01/07/2024 05/19/2024   Balance   1    2. Walking   3    3.     4.      Total Score 2      Total Score = Sum of activity scores/number of activities  Minimally Detectable Change: 3  points (for single activity); 2 points (for average score)  [facility] (nd). The Patient Specific Functional Scale . Retrieved from [email or web address]   COGNITION: Overall cognitive status: Within functional limits for tasks assessed     SENSATION: Light touch: WFL  MUSCLE LENGTH: Not assessed on eval  POSTURE: rounded shoulders, forward head, and increased thoracic kyphosis  PALPATION: TTP 2/10 at Left upper/lateral glute  LUMBAR ROM:   AROM Eval 01/07/2024  Flexion WFL  Extension 50%  Right lateral flexion 50-75%  Left lateral flexion 50-75%  Right rotation 50%  Left rotation 50%    (Blank rows = not tested)  LOWER EXTREMITY ROM:     ROM Right Eval 01/07/2024 Left Eval 01/07/2024 Left/Right 05/19/2024  Hip flexion [facility] WFL  105/105  Hip extension  Hip abduction     Hip adduction     Hip internal rotation   12/17  Hip external rotation   12/30  Knee flexion     Knee extension     Ankle dorsiflexion     Ankle plantarflexion     Ankle inversion     Ankle eversion     Hamstrings   50/50   (Blank rows = not tested)  LOWER EXTREMITY MMT:    MMT Right Eval 01/07/2024 Left Eval 01/07/2024 Left/Right 05/19/2024  Hip flexion (seated) 5/5 4-/5 4+/4+  Hip extension (prone) 3-/5 3-/5   Hip abduction (sidelying) 3-/5 3-/5 3-/4+  Hip adduction     Hip internal rotation     Hip external rotation     Knee flexion (seated) 5/5 4-/5   Knee extension (seated) 5/5 4/5 4+/5  Ankle dorsiflexion     Ankle plantarflexion     Ankle inversion     Ankle eversion      (Blank rows = not tested)  LUMBAR SPECIAL TESTS:  Straight leg raise test: Negative, Slump test: Negative, and FABER test: Positive on L, but due to past hip fracture Clonus: negative   FUNCTIONAL TESTS:  5 times sit to stand: 10.27s  performed with SBA 4 stance balance: normal and narrow 10s, unable to complete tandem, 4s Rt 5s Lt single  leg with lateral LOB and appropriate stepping reactions ; performed with CGA   GAIT: Distance walked: not formally assessed  Assistive device utilized: None Level of assistance: SBA Comments: increased IR throughout gait with Left LE, antalgic gait pattern, Left lateral lean with Left stance  TREATMENT DATE:  05/19/2024    01/17/2024 Lumbar extension AROM 10 x 3 seconds Standing hip extensions 10 x 3 seconds Standing alternating hip abduction 2 sets of 10 for 3 seconds  Functional Activities: Hip hike in door frame 2 sets of 5 for 3 seconds Prone hip extension (alternate) 10 x 3 seconds  02464: Reviewed spine imaging; spine anatomy with model; practical bed mobility and posture with house chores (dishes); lumbar roll use; disc pressure in various postures   01/07/2024 TherEx:  HEP handout provided with patient performing one set of each exercise for appropriate form. Verbal and tactile cues required.                                                                                                                                PATIENT EDUCATION:  Education details: HEP, POC  Person educated: Patient Education method: Explanation, Demonstration, Tactile cues, Verbal cues, and Handouts Education comprehension: verbalized understanding, returned demonstration, verbal cues required, and tactile cues required  HOME EXERCISE PROGRAM: Access Code: [email or web address] URL: [email or web address] Date: 01/23/2024 Prepared by: Lamar Ivory  Exercises - Standing Hip Abduction with Counter Support  - 1 x daily - 7 x weekly - 2 sets - 10 reps - Standing Hip Extension with Counter Support  - 1 x daily -  7 x weekly - 2 sets - 10 reps - Seated Active Straight-Leg Raise  - 1 x daily - 7 x weekly - 2 sets - 10 reps - Heel Raises with Counter Support  - 1 x daily - 7 x weekly - 2 sets - 10 reps - Mini Squat with Counter Support  - 1 x daily - 7 x weekly - 2 sets - 10 reps - Standing  Lumbar Extension at Wall - Forearms  - 5 x daily - 7 x weekly - 1 sets - 5 reps - 3 seconds hold - Standing Hip Hiking  - 5 x daily - 7 x weekly - 1 sets - 5 reps - 3 seconds hold - Prone Hip Extension  - 2 x daily - 7 x weekly - 1-2 sets - 10 reps - 3 seconds hold  ASSESSMENT:  CLINICAL IMPRESSION: Fotini did a good job of recalling her exercises from her first visit.  We reviewed these activities and made progressions to her hip abductors and erector spinae strengthening.  Darenda is more comfortable sitting, although we discussed that is important to change position during the day and we reviewed disc pressures and various postures.  Recurrent low back pain and sciatica is in the small part related to her previous femur fracture and resulting long-term hip abductor's weakness.  We also added a lumbar extension activity to help reduce disc pressure on the lumbar nerve roots.  She would benefit from a recommended course of physical therapy.  Patient is a 68 y.o. F who was seen today for physical therapy evaluation and treatment for low back pain with radiating pain into left hip and LE with functional mobility deficits, strength deficits, gait abnormalities and pain with ambulation, balance deficits, and postural deficits. Patient is most limited by balance deficits and pain with ambulation. Patient has had a complicated course that has led to a hospitalization, though received HHPT for 8 visits following. Patient will benefit from skilled PT to address above noted deficits.   OBJECTIVE IMPAIRMENTS: Abnormal gait, decreased activity tolerance, decreased balance, decreased coordination, decreased endurance, decreased mobility, difficulty walking, decreased ROM, decreased strength, improper body mechanics, postural dysfunction, and pain.   ACTIVITY LIMITATIONS: carrying, lifting, and stairs  PARTICIPATION LIMITATIONS: meal prep, cleaning, laundry, community activity, and caretaker duties  PERSONAL  FACTORS: Time since onset of injury/illness/exacerbation and 3+ comorbidities: GERD, HTN, ulcerative colitis, Turner's syndrome, osteopenia, dyslipidemia are also affecting patient's functional outcome.   REHAB POTENTIAL: Good  CLINICAL DECISION MAKING: Evolving/moderate complexity  EVALUATION COMPLEXITY: Moderate   GOALS: Goals reviewed with patient? Yes  SHORT TERM GOALS: Target date: 01/28/2024  Patient will show compliance with initial HEP. Baseline: Goal status: Met 01/23/2024  2.  Patient will report pain levels no greater than 5/10 to show improved overall quality of life. Baseline:  Goal status: INITIAL   LONG TERM GOALS: Target date: 03/03/2024  Patient will show independence with final HEP in order to maintain and progress upon functional gains made within PT. Baseline:  Goal status: INITIAL  2.  Patient will report pain levels no greater than 3/10 to show improved overall quality of life. Baseline:  Goal status: INITIAL  3.  Patient will increase PSFS to at least 4 to show significant improvement in subjective disability rating. Baseline:  Goal status: INITIAL  4.  Patient will increase hip abduction MMT to at least 4-/5 in order to show improved biomechanics with functional mobility and gait.  Baseline:  Goal status: INITIAL  5.  Patient will improve 4 stance balance in order to show increase in steadiness on feet.  Baseline:  Goal status: INITIAL  PLAN:  PT FREQUENCY: 1-2x/week  PT DURATION: 8 weeks  PLANNED INTERVENTIONS: 97164- PT Re-evaluation, 97750- Physical Performance Testing, 97110-Therapeutic exercises, 97530- Therapeutic activity, [id number]- Neuromuscular re-education, 97535- Self Care, [id number]- Manual therapy, [phone]- Gait training, [phone]- Electrical stimulation (unattended), [phone]- Electrical stimulation (manual), [id number]- Vasopneumatic device, [id number]- Ultrasound, [id number]- Traction (mechanical), [id number]- Ionotophoresis 4mg /ml Dexamethasone , [id number] (1-2  muscles), [id number] (3+ muscles)- Dry Needling, Patient/Family education, Balance training, Stair training, Taping, Joint mobilization, Joint manipulation, Spinal manipulation, Spinal mobilization, Scar mobilization, Vestibular training, DME instructions, Cryotherapy, and Moist heat.  PLAN FOR NEXT SESSION: Emphasis on reducing the lumbar disc (extension), hip abductors, erector spinae and quadratus lumborum strength work to improve endurance with standing and walking.  Static balance, in depth gait assessment, dynamic balance assessment, hip and LE strengthening.   Myer LELON Ivory, PT, MPT 05/19/2024 9:38 AM    "

## 2024-05-19 NOTE — Therapy (Signed)
 " OUTPATIENT PHYSICAL THERAPY THORACOLUMBAR TREATMENT  Patient Name: [name] MRN: [id number] DOB:July 24, 1955, 68 y.o., female Today's Date: 05/19/2024    END OF SESSION:  PT End of Session - 05/19/24 0934     Visit Number 15    Number of Visits 18    Date for Recertification  06/11/24    Authorization Type [employer] MEDICARE $20 COPAY    Progress Note Due on Visit 20    PT Start Time 0934    PT Stop Time 1017    PT Time Calculation (min) 43 min    Activity Tolerance Patient tolerated treatment well;No increased pain;Patient limited by fatigue    Behavior During Therapy [facility] for tasks assessed/performed            Past Medical History:  Diagnosis Date   B12 DEFICIENCY 02/09/2009   Qualifier: Diagnosis of  By: Joshua RN, CGRN, Sheri     Blood in stool    COLONIC POLYPS, HYPERPLASTIC, HX OF 02/03/2009   Qualifier: Diagnosis of  By: Surface RN, Arland MENDS, COLON 02/03/2009   Qualifier: Diagnosis of  By: Surface RN, Donna     Dyslipidemia 06/20/2012   Fracture, intertrochanteric, left femur (HCC) 05/02/2012   GERD (gastroesophageal reflux disease)    Hearing loss    Bil/has hearing aids   Hemorrhoids    HEMORRHOIDS 02/03/2009   Qualifier: Diagnosis of  By: Surface RN, Arland     Hypertension    INFLAMMATORY BOWEL DISEASE - Followed by Dr. Jakie in GI 02/03/2009   Qualifier: Diagnosis of  By: Surface RN, Arland Bristle    Turner's syndrome    was on Provera and Premarin and d/c this  at age 81yr   Ulcerative colitis    Past Surgical History:  Procedure Laterality Date   COLONOSCOPY  last 03/25/2013   HERNIA REPAIR  2009   lt ing    INCISION AND DRAINAGE  2004    lt thumb dog bite   INTRAMEDULLARY (IM) NAIL INTERTROCHANTERIC  05/02/2012   Procedure: INTRAMEDULLARY (IM) NAIL INTERTROCHANTRIC;  Surgeon: Fonda SHAUNNA Olmsted, MD;  Location: [facility] OR;  Service: Orthopedics;  Laterality: Left;   ORIF FINGER FRACTURE  06/14/2011   Procedure: OPEN REDUCTION  INTERNAL FIXATION (ORIF) METACARPAL (FINGER) FRACTURE;  Surgeon: Franky JONELLE Curia, MD;  Location: [facility];  Service: Orthopedics;  Laterality: Right;  right ring   POLYPECTOMY     TONSILLECTOMY     UPPER GASTROINTESTINAL ENDOSCOPY     Patient Active Problem List   Diagnosis Date Noted   Hyponatremia 10/19/2023   Thrombocytopenia 10/19/2023   Acute leg pain, left 07/19/2022   Osteopenia/?Osteoporosis - followed by her gynecologist, Dr. Kandyce 06/20/2012   Dyslipidemia 06/20/2012   H/O Turner syndrome 05/02/2012   Essential hypertension 02/03/2009   INFLAMMATORY BOWEL DISEASE - Followed by GI 02/03/2009   History of colonic polyps 02/03/2009    PCP: Heron CHRISTELLA Sharper, MD   REFERRING PROVIDER: [name], MD  REFERRING DIAG: M54.50 (ICD-10-CM) - Lumbar pain  Rationale for Evaluation and Treatment: Rehabilitation  THERAPY DIAG:  Other low back pain  Muscle weakness (generalized)  Pain in left hip  Stiffness of left hip, not elsewhere classified  Unsteadiness on feet  Other abnormalities of gait and mobility  ONSET DATE: 4 years ago when it started; 2 years it has gotten worse   SUBJECTIVE:  SUBJECTIVE STATEMENT: [name] notes significant limitations with her walking and standing.     PERTINENT HISTORY:  Patient states she experienced a femur fracture in 2013 from a fall at work, but has not had any other injuries or surgeries to the area. Patient received a cortisone shot for Left hip pain then went to ER 3 days as she started having slurred speech, balance issues, and ambulation deficits. After patient was released, she went to Dr. Burnetta and received a prednisone  taper pack which assisted her pain, but she had withdrawal-like symptoms after completing. Patient also  noted she is having platelet issues and is seeing a hematologist.  PAIN:  Are you having pain? Yes: NPRS scale: As high as 6/10 with standing and walking  Pain location: left lower back and down left LE  Pain description: burning Aggravating factors: walking Relieving factors: sitting/resting  PRECAUTIONS: None  RED FLAGS: Noting some bladder incontinence, but being followed by OB/GYN for that    WEIGHT BEARING RESTRICTIONS: No  FALLS:  Has patient fallen in last 6 months? 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  DIAGNOSTIC FINDINGS:  XRAY: showing disc at loss at L4/5 and L5/S1.  No evidence of  instability on flexion/extension views.  No fracture or dislocation seen.   MRI IMPRESSION: 1. Right eccentric disc bulge and facet arthropathy at L4-L5 resulting in moderate right neural foraminal narrowing. 2. Disc bulge at L5-S1 with lateral disc contacting the exited left L5 nerve root in the extraforaminal zone.  PATIENT SURVEYS:  PSFS: THE PATIENT SPECIFIC FUNCTIONAL SCALE  Place score of 0-10 (0 = unable to perform activity and 10 = able to perform activity at the same level as before injury or problem)  Activity Date: 01/07/2024 02/10/2024 03/10/2024   Balance   1 5 7   2. Walking   3 6 6   3.     4.      Total Score 2 5.5 6.5    Total Score = Sum of activity scores/number of activities  Minimally Detectable Change: 3 points (for single activity); 2 points (for average  score)  [name] (nd). The Patient Specific Functional Scale . Retrieved from [email or web address]   COGNITION: Overall cognitive status: Within functional limits for tasks assessed     SENSATION: Light touch: WFL  MUSCLE LENGTH: Not assessed on eval  POSTURE: rounded shoulders, forward head, and increased thoracic kyphosis  PALPATION: TTP 2/10 at Left upper/lateral glute  LUMBAR ROM:   AROM Eval 01/07/2024 02/10/2024  Flexion WFL   Extension 50%   Right lateral flexion 50-75%   Left lateral flexion 50-75%   Right rotation 50%   Left rotation 50%     (Blank rows = not tested)  LOWER EXTREMITY ROM:     ROM Right Eval 01/07/2024 Left Eval 01/07/2024 Left/Right 05/19/2024  Hip flexion [facility]  105/105  Hip extension     Hip abduction     Hip adduction     Hip internal rotation   12/17  Hip external rotation   12/30  Knee  flexion     Knee extension     Ankle dorsiflexion     Ankle plantarflexion     Ankle inversion     Ankle eversion     Hamstrings   50/50   (Blank rows = not tested)  LOWER EXTREMITY MMT:    MMT Right Eval 01/07/2024 Left Eval 01/07/2024 03/10/2024 04/03/2024 05/19/2024  Hip flexion (seated) 5/5 4-/5   4+/4+  Hip extension (prone) 3-/5 3-/5     Hip abduction (sidelying) 3-/5 3-/5 Rt: 3+/5 Lt: 3+/5 Lt: 2+/5 3-/4+  Hip adduction       Hip internal rotation       Hip external rotation       Knee flexion (seated) 5/5 4-/5     Knee extension (seated) 5/5 4/5   4+/5  Ankle dorsiflexion       Ankle plantarflexion       Ankle inversion       Ankle eversion        (Blank rows = not tested)  LUMBAR SPECIAL TESTS:  Straight leg raise test: Negative, Slump test: Negative, and FABER test: Positive on L, but due to past hip fracture Clonus: negative   FUNCTIONAL TESTS:  5 times sit to stand: 10.27s  performed with SBA 4 stance balance: normal and narrow 10s, unable to complete  tandem, 4s Rt 5s Lt single leg with lateral LOB and appropriate stepping reactions ; performed with CGA   03/10/2024 5xSTS: 11.22s performed with SBA  4 stance balance: normal: 10s narrow: 10s tandem Rt: 2s tandem LT: 3s Rt LE: 2s Lt LE: 2s   GAIT: Distance walked: not formally assessed  Assistive device utilized: None Level of assistance: SBA Comments: increased IR throughout gait with Left LE, antalgic gait pattern, Left lateral lean with Left stance  TREATMENT DATE:  05/19/2024 Side-lie clams with Green Thera-Band resistance 2 sets of 10 for 2 seconds and slow eccentrics  Functional Activities: Hip hike parallel to counter top 2 sets of 10 for 3 seconds, avoid lateral lean  Neuromuscular re-education: Tandem balance 10 x 20 seconds, adjust width to encourage hip strategy  [id number]: Reviewed objective findings and discussed focusing efforts on hip abductors strength as this is the most glaring objective impairment   05/12/2024 TherEx:  Nustep LE only level 3 for 8 minutes  Sidelying clamshells 2x12 with 2s holds each side with green TB  Hooklying bridge with yellow ball between knees 2x10 with 2s holds  Fwd step ups with 4 steps 2x10 each side with unilat UE support (mainly fingertips for stability) Lateral step ups with 4 step 1x10 each side with unilat UE support (mainly fingertips for stability) Minimal increases in pain in Lt LE following this activity, improved with rest   Neuro Re-Ed:  Rocker board fwd/back with UE support 1x20 taps  Rocker board lateral with UE support 1x20 taps  Patient reliant on UE support to appropriately perform above rocker board activities  PT discussed semi-tandem/staggered stance at home and how to increase progression slowly instead of going from staggered straight to tandem    05/07/2024 TherEx:  Nustep LE only level 3 for 6 minutes  Decreased time on machine today secondary to increased soreness/pain Sidelying clamshells with red TB  2x15 each side   Neuro Re-Ed:  Discussed performing staggered stance instead of tandem stance in order to decrease large challenge and slowly decreasing distance between feet once able to reach 30s with no UE support  Semitandem/staggered stance with horizontal head  movements 1x12 each foot back with increased sway, but no LOB or UE use   Semitandem/staggered stance with vertical head movements 1x16 each foot back with increased sway, but no LOB or UE use  Typical/semi narrow stance with horizontal head movements with increased sway, but no LOB or UE use  Typical/semi narrow stance with vertical head movementswith increased sway, but no LOB or UE use  Lateral walking on foam pad with intermittent UE use on // bars 1x4 down and back with high levels of fatigue ; 1x2 down and back   PATIENT EDUCATION:  Education details: HEP, POC  Person educated: Patient Education method: Explanation, Demonstration, Tactile cues, Verbal cues, and Handouts Education comprehension: verbalized understanding, returned demonstration, verbal cues required, and tactile cues required  HOME EXERCISE PROGRAM: Access Code: [email or web address] URL: [email or web address] Date: 05/19/2024 Prepared by: Lamar Ivory  Exercises - Standing Hip Abduction with Counter Support  - 1 x daily - 7 x weekly - 2 sets - 10 reps - Standing Hip Extension with Counter Support  - 1 x daily - 7 x weekly - 2 sets - 10 reps - Seated Active Straight-Leg Raise  - 1 x daily - 7 x weekly - 2 sets - 10 reps - Heel Raises with Counter Support  - 1 x daily - 7 x weekly - 2 sets - 10 reps - Mini Squat with Counter Support  - 1 x daily - 7 x weekly - 2 sets - 10 reps - Standing Lumbar Extension at Wall - Forearms  - 5 x daily - 7 x weekly - 1 sets - 5 reps - 3 seconds hold - Standing Hip Hiking  - 5 x daily - 7 x weekly - 1-2 sets - 10 reps - 3 seconds hold - Prone Hip Extension  - 2 x daily - 7 x weekly - 1-2 sets - 10 reps - 3 seconds  hold - Romberg Stance with Head Rotation  - 1 x daily - 7 x weekly - 2 sets - 10 reps - Romberg Stance with Eyes Closed  - 1 x daily - 7 x weekly - 2 sets - 10 reps - Step Up  - 1 x daily - 7 x weekly - 2 sets - 10 reps - Standing Forward Step Taps with Counter Support  - 1 x daily - 7 x weekly - 2 sets - 10 reps - Clamshell with Resistance  - 2 x daily - 7 x weekly - 2-3 sets - 10 reps - 2-3 seconds hold - Tandem Stance  - 3 x daily - 7 x weekly - 1 sets - 10 reps - 20 second hold  ASSESSMENT:  CLINICAL IMPRESSION: Captola notes slow progress with her supervised physical therapy.  Assessment today shows she is working on the right interventions.  However, her hip abductors weakness is so profound, it is going to take a prolonged rehabilitation.  Functional progress is noted on her Patient Specific Functional Scale (6.5, was 2, showing significant functional progress but still limited 35%).  [name] will continue to benefit from supervised PT with a heavy emphasis on hip abductors strengthening.  She will follow-up with Dr. Georgina in February for additional medical recommendations.  OBJECTIVE IMPAIRMENTS: Abnormal gait, decreased activity tolerance, decreased balance, decreased coordination, decreased endurance, decreased mobility, difficulty walking, decreased ROM, decreased strength, improper body mechanics, postural dysfunction, and pain.   ACTIVITY LIMITATIONS: carrying, lifting, and stairs  PARTICIPATION LIMITATIONS: meal prep, cleaning, laundry, community activity, and caretaker duties  PERSONAL FACTORS: Time since onset of injury/illness/exacerbation and 3+ comorbidities: GERD, HTN, ulcerative colitis, Turner's syndrome, osteopenia, dyslipidemia are also affecting patient's functional outcome.   REHAB POTENTIAL: Good  CLINICAL DECISION MAKING: Evolving/moderate complexity  EVALUATION COMPLEXITY: Moderate   GOALS: Goals reviewed with patient? Yes  SHORT TERM GOALS: Target date:  01/28/2024  Patient will show compliance with initial HEP. Baseline: Goal status: Met 01/23/2024  2.  Patient will report pain levels no greater than 5/10 to show improved overall quality of life. Baseline:  Goal status: Ongoing 05/09/2024   LONG TERM GOALS: Target date: 06/11/2024  Patient will show independence with final HEP in order to maintain and progress upon functional gains made within PT. Baseline:  Goal status: Ongoing 05/19/2024  2.  Patient will report pain levels no greater than 3/10 to show improved overall quality of life. Baseline:  Goal status: Ongoing 05/19/2024  3.  Patient will increase PSFS to at least 4 to show significant improvement in subjective disability rating. Baseline:  Goal status: GOAL MET, 03/10/2024  4.  Patient will increase hip abduction MMT to at least 4-/5 in order to show improved biomechanics with functional mobility and gait.  Baseline:  Goal status: Ongoing 05/19/2024  5.  Patient will improve 4 stance balance in order to show increase in steadiness on feet.  Baseline:  Goal status: Ongoing 05/19/2024  PLAN:  PT FREQUENCY: 1-2x/week  PT DURATION: 4 additional weeks (likely will need a re-cert and additional visits after 06/11/2024)  PLANNED INTERVENTIONS: 97164- PT Re-evaluation, 97750- Physical Performance Testing, 97110-Therapeutic exercises, 97530- Therapeutic activity, 97112- Neuromuscular re-education, 97535- Self Care, 02859- Manual therapy, [phone]- Gait training, [phone]- Electrical stimulation (unattended), [phone]- Electrical stimulation (manual), [id number]- Vasopneumatic device, [id number]- Ultrasound, [id number]- Traction (mechanical), [id number]- Ionotophoresis 4mg /ml Dexamethasone , 79439 (1-2 muscles), 20561 (3+ muscles)- Dry Needling, Patient/Family education, Balance training, Stair training, Taping, Joint mobilization, Joint manipulation, Spinal manipulation, Spinal mobilization, Scar mobilization, Vestibular training, DME instructions, Cryotherapy,  and Moist heat.  PLAN FOR NEXT SESSION: Heavy emphasis on hip abductors strength and secondarily balance.   Myer LELON Ivory, PT, MPT 05/19/2024 12:09 PM     "

## 2024-05-25 ENCOUNTER — Encounter (INDEPENDENT_AMBULATORY_CARE_PROVIDER_SITE_OTHER): Payer: Self-pay | Admitting: Otolaryngology

## 2024-05-25 ENCOUNTER — Ambulatory Visit (INDEPENDENT_AMBULATORY_CARE_PROVIDER_SITE_OTHER): Admitting: Otolaryngology

## 2024-05-25 VITALS — BP 130/79 | HR 77 | Ht <= 58 in | Wt 155.0 lb

## 2024-05-25 DIAGNOSIS — H903 Sensorineural hearing loss, bilateral: Secondary | ICD-10-CM | POA: Insufficient documentation

## 2024-05-25 DIAGNOSIS — H90A31 Mixed conductive and sensorineural hearing loss, unilateral, right ear with restricted hearing on the contralateral side: Secondary | ICD-10-CM | POA: Diagnosis not present

## 2024-05-25 DIAGNOSIS — H90A22 Sensorineural hearing loss, unilateral, left ear, with restricted hearing on the contralateral side: Secondary | ICD-10-CM

## 2024-05-25 NOTE — Progress Notes (Signed)
 CC: Bilateral hearing loss  Discussed the use of AI scribe software for clinical note transcription with the patient, who gave verbal consent to proceed.  History of Present Illness [name] is a 69 year old female with longstanding bilateral sensorineural hearing loss who presents for evaluation of worsening hearing, particularly on the right side.  She reports having worn hearing aids for many years due to longstanding hearing loss. Over recent months, she has experienced further decline in hearing, most notably in the right ear. Audiologic evaluation in November 2025 revealed a new conductive component in the right ear. At that time, her hearing aids were malfunctioning and were sent for repair.  She is currently wearing her repaired hearing aids and reports significant improvement in hearing with her use. However, she continues to experience difficulty understanding speech in group settings with multiple simultaneous conversations.  She notes frequent cerumen accumulation but denies current symptoms related to wax buildup. She denies otalgia, otorrhea, and recent upper respiratory infections.  She has a remote history of childhood tonsillectomy, complicated by postoperative ear bleeding requiring cauterization.   Past Medical History:  Diagnosis Date   B12 DEFICIENCY 02/09/2009   Qualifier: Diagnosis of  By: Joshua RN, CGRN, Sheri     Blood in stool    COLONIC POLYPS, HYPERPLASTIC, HX OF 02/03/2009   Qualifier: Diagnosis of  By: Surface RN, Arland MENDS, COLON 02/03/2009   Qualifier: Diagnosis of  By: Surface RN, Donna     Dyslipidemia 06/20/2012   Fracture, intertrochanteric, left femur (HCC) 05/02/2012   GERD (gastroesophageal reflux disease)    Hearing loss    Bil/has hearing aids   Hemorrhoids    HEMORRHOIDS 02/03/2009   Qualifier: Diagnosis of  By: Surface RN, Arland     Hypertension    INFLAMMATORY BOWEL DISEASE - Followed by Dr. Jakie in GI 02/03/2009    Qualifier: Diagnosis of  By: Surface RN, Arland Bristle    Turner's syndrome    was on Provera and Premarin and d/c this  at age 66yr   Ulcerative colitis     Past Surgical History:  Procedure Laterality Date   COLONOSCOPY  last 03/25/2013   HERNIA REPAIR  2009   lt ing    INCISION AND DRAINAGE  2004    lt thumb dog bite   INTRAMEDULLARY (IM) NAIL INTERTROCHANTERIC  05/02/2012   Procedure: INTRAMEDULLARY (IM) NAIL INTERTROCHANTRIC;  Surgeon: Fonda SHAUNNA Olmsted, MD;  Location: [facility] OR;  Service: Orthopedics;  Laterality: Left;   ORIF FINGER FRACTURE  06/14/2011   Procedure: OPEN REDUCTION INTERNAL FIXATION (ORIF) METACARPAL (FINGER) FRACTURE;  Surgeon: Franky JONELLE Curia, MD;  Location: [facility];  Service: Orthopedics;  Laterality: Right;  right ring   POLYPECTOMY     TONSILLECTOMY     UPPER GASTROINTESTINAL ENDOSCOPY      Family History  Problem Relation Age of Onset   Lymphoma Mother        nhl-mantle cell   Lymphoma Father        nhl   Healthy Sister    Melanoma Maternal Grandmother    Heart disease Maternal Grandfather    Ovarian cancer Paternal Grandmother    Pancreatic cancer Paternal Grandfather    Colon cancer Neg Hx    Esophageal cancer Neg Hx    Stomach cancer Neg Hx    Rectal cancer Neg Hx    Breast cancer Neg Hx    Colon polyps Neg Hx  Social History:  reports that she has never smoked. She has never used smokeless tobacco. She reports current alcohol use of about 1.0 standard drink of alcohol per week. She reports that she does not use drugs.  Allergies: Allergies[1]  Prior to Admission medications  Medication Sig Start Date End Date Taking? Authorizing Provider  alendronate  (FOSAMAX ) 70 MG tablet Take 70 mg by mouth once a week. 08/21/21  Yes [provider]  Calcium  Polycarbophil (FIBER-CAPS PO) Take 3 tablets by mouth daily at 6 (six) AM.   Yes [provider]  celecoxib  (CELEBREX ) 100 MG capsule Take 100 mg by mouth as  needed for mild pain (pain score 1-3) or moderate pain (pain score 4-6). 10/03/23  Yes [provider]  Docusate Sodium  (COLACE PO) Take 3 capsules by mouth daily at 6 (six) AM.   Yes [provider]  lisinopril  (ZESTRIL ) 40 MG tablet TAKE 1 TABLET BY MOUTH EVERY DAY 04/14/24  Yes Ozell Heron HERO, MD  metoprolol  succinate (TOPROL -XL) 25 MG 24 hr tablet TAKE 1 TABLET (25 MG TOTAL) BY MOUTH DAILY. 04/27/24  Yes Ozell Heron HERO, MD  rosuvastatin  (CRESTOR ) 20 MG tablet TAKE 1 TABLET BY MOUTH DAILY. GENERIC EQUIVALENT FOR CRESTOR . 04/27/24  Yes Ozell Heron HERO, MD  traZODone  (DESYREL ) 50 MG tablet TAKE ONE-HALF TO ONE TABLET BY MOUTH AT BEDTIME AS NEED FOR SLEEP 04/01/24  Yes Ozell Heron HERO, MD  VITAMIN D  PO Take 2,000 Units by mouth daily.   Yes [provider]    Blood pressure 130/79, pulse 77, height 4' 9 (1.448 m), weight 155 lb (70.3 kg), SpO2 95%. Exam: General: Communicates without difficulty, well nourished, no acute distress. Head: Normocephalic, no evidence injury, no tenderness, facial buttresses intact without stepoff. Face/sinus: No tenderness to palpation and percussion. Facial movement is normal and symmetric. Eyes: PERRL, EOMI. No scleral icterus, conjunctivae clear. Neuro: CN II exam reveals vision grossly intact.  No nystagmus at any point of gaze. Ears: Auricles well formed without lesions.  Ear canals are intact without mass or lesion.  No erythema or edema is appreciated.  The TMs are intact without fluid. Nose: External evaluation reveals normal support and skin without lesions.  Dorsum is intact.  Anterior rhinoscopy reveals normal mucosa over anterior aspect of inferior turbinates and intact septum.  No purulence noted. Oral:  Oral cavity and oropharynx are intact, symmetric, without erythema or edema.  Mucosa is moist without lesions. Neck: Full range of motion without pain.  There is no significant lymphadenopathy.  No masses palpable.  Thyroid   bed within normal limits to palpation.  Parotid glands and submandibular glands equal bilaterally without mass.  Trachea is midline. Neuro:  CN 2-12 grossly intact.   Assessment and Plan Assessment & Plan Bilateral sensorineural hearing loss with right-sided conductive component She has longstanding severe bilateral sensorineural hearing loss, now with a new mild conductive component on the right. Examination revealed no evidence of infection, middle ear effusion, or cerumen impaction. The conductive component may be attributable to middle ear pathology or otosclerosis, with no active mechanical or infectious etiology identified. Her hearing aids are functioning well and provide satisfactory auditory improvement.  - Recommended continued use of hearing aids. - Advised follow-up with audiology in April for hearing aid testing and reassessment. - Recommended routine audiology evaluations at least twice yearly. - Scheduled otolaryngology follow-up in one year for reassessment and otoscopic examination. - Offered cerumen removal if needed at future visits.   [name] W [name] 05/25/2024, 2:58  PM      [1]  Allergies Allergen Reactions   Prednisone  Nausea And Vomiting and Other (See Comments)     headache, dropping platelets

## 2024-05-28 ENCOUNTER — Ambulatory Visit

## 2024-05-28 DIAGNOSIS — M6281 Muscle weakness (generalized): Secondary | ICD-10-CM

## 2024-05-28 DIAGNOSIS — R2689 Other abnormalities of gait and mobility: Secondary | ICD-10-CM | POA: Diagnosis not present

## 2024-05-28 DIAGNOSIS — R2681 Unsteadiness on feet: Secondary | ICD-10-CM | POA: Diagnosis not present

## 2024-05-28 DIAGNOSIS — M25652 Stiffness of left hip, not elsewhere classified: Secondary | ICD-10-CM

## 2024-05-28 DIAGNOSIS — M5459 Other low back pain: Secondary | ICD-10-CM | POA: Diagnosis not present

## 2024-05-28 DIAGNOSIS — M25552 Pain in left hip: Secondary | ICD-10-CM

## 2024-05-28 NOTE — Therapy (Signed)
 " OUTPATIENT PHYSICAL THERAPY THORACOLUMBAR TREATMENT / DISCHARGE  Patient Name: [name] MRN: [id number] DOB:Mar 13, 1956, 69 y.o., female Today's Date: 05/28/2024    END OF SESSION:  PT End of Session - 05/28/24 0936     Visit Number 16    Number of Visits 18    Date for Recertification  06/11/24    Authorization Type BCBS MEDICARE $20 COPAY    Progress Note Due on Visit 20    PT Start Time 0936    PT Stop Time 1014    PT Time Calculation (min) 38 min    Activity Tolerance Patient tolerated treatment well;No increased pain;Patient limited by fatigue    Behavior During Therapy [facility] for tasks assessed/performed             Past Medical History:  Diagnosis Date   B12 DEFICIENCY 02/09/2009   Qualifier: Diagnosis of  By: Joshua RN, CGRN, Sheri     Blood in stool    COLONIC POLYPS, HYPERPLASTIC, HX OF 02/03/2009   Qualifier: Diagnosis of  By: Surface RN, Arland MENDS, COLON 02/03/2009   Qualifier: Diagnosis of  By: Surface RN, Donna     Dyslipidemia 06/20/2012   Fracture, intertrochanteric, left femur (HCC) 05/02/2012   GERD (gastroesophageal reflux disease)    Hearing loss    Bil/has hearing aids   Hemorrhoids    HEMORRHOIDS 02/03/2009   Qualifier: Diagnosis of  By: Surface RN, Arland     Hypertension    INFLAMMATORY BOWEL DISEASE - Followed by Dr. Jakie in GI 02/03/2009   Qualifier: Diagnosis of  By: Surface RN, Arland Bristle    Turner's syndrome    was on Provera and Premarin and d/c this  at age 55yr   Ulcerative colitis    Past Surgical History:  Procedure Laterality Date   COLONOSCOPY  last 03/25/2013   HERNIA REPAIR  2009   lt ing    INCISION AND DRAINAGE  2004    lt thumb dog bite   INTRAMEDULLARY (IM) NAIL INTERTROCHANTERIC  05/02/2012   Procedure: INTRAMEDULLARY (IM) NAIL INTERTROCHANTRIC;  Surgeon: Fonda SHAUNNA Olmsted, MD;  Location: [facility] OR;  Service: Orthopedics;  Laterality: Left;   ORIF FINGER FRACTURE  06/14/2011   Procedure: OPEN  REDUCTION INTERNAL FIXATION (ORIF) METACARPAL (FINGER) FRACTURE;  Surgeon: Franky JONELLE Curia, MD;  Location: [facility];  Service: Orthopedics;  Laterality: Right;  right ring   POLYPECTOMY     TONSILLECTOMY     UPPER GASTROINTESTINAL ENDOSCOPY     Patient Active Problem List   Diagnosis Date Noted   Sensorineural hearing loss, bilateral 05/25/2024   Hyponatremia 10/19/2023   Thrombocytopenia 10/19/2023   Acute leg pain, left 07/19/2022   Osteopenia/?Osteoporosis - followed by her gynecologist, Dr. Kandyce 06/20/2012   Dyslipidemia 06/20/2012   H/O Turner syndrome 05/02/2012   Essential hypertension 02/03/2009   INFLAMMATORY BOWEL DISEASE - Followed by GI 02/03/2009   History of colonic polyps 02/03/2009    PCP: Heron CHRISTELLA Sharper, MD   REFERRING PROVIDER: [name], MD  REFERRING DIAG: M54.50 (ICD-10-CM) - Lumbar pain  Rationale for Evaluation and Treatment: Rehabilitation  THERAPY DIAG:  Other low back pain  Muscle weakness (generalized)  Pain in left hip  Stiffness of left hip, not elsewhere classified  Unsteadiness on feet  Other abnormalities of gait and mobility  ONSET DATE: 4 years ago when it started; 2 years it has gotten worse   SUBJECTIVE:  SUBJECTIVE STATEMENT: Patient reports that she is traveling soon to see family and reports no major changes.     PERTINENT HISTORY:  Patient states she experienced a femur fracture in 2013 from a fall at work, but has not had any other injuries or surgeries to the area. Patient received a cortisone shot for Left hip pain then went to ER 3 days as she started having slurred speech, balance issues, and ambulation deficits. After patient was released, she went to Dr. Burnetta and received a prednisone  taper pack which assisted  her pain, but she had withdrawal-like symptoms after completing. Patient also noted she is having platelet issues and is seeing a hematologist.  PAIN:  Are you having pain? Yes: NPRS scale: 5/10 in Lt hip  Pain location: left lower back and down left LE  Pain description: burning Aggravating factors: walking Relieving factors: sitting/resting  PRECAUTIONS: None  RED FLAGS: Noting some bladder incontinence, but being followed by OB/GYN for that    WEIGHT BEARING RESTRICTIONS: No  FALLS:  Has patient fallen in last 6 months? 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  DIAGNOSTIC FINDINGS:  XRAY: showing disc at loss at L4/5 and L5/S1.  No evidence of  instability on flexion/extension views.  No fracture or dislocation seen.   MRI IMPRESSION: 1. Right eccentric disc bulge and facet arthropathy at L4-L5 resulting in moderate right neural foraminal narrowing. 2. Disc bulge at L5-S1 with lateral disc contacting the exited left L5 nerve root in the extraforaminal zone.  PATIENT SURVEYS:  PSFS: THE PATIENT SPECIFIC FUNCTIONAL SCALE  Place score of 0-10 (0 = unable to perform activity and 10 = able to perform activity at the same level as before injury or problem)  Activity Date: 01/07/2024 02/10/2024 03/10/2024   Balance   1 5 7   2. Walking   3 6 6   3.     4.      Total Score 2 5.5 6.5    Total Score = Sum of activity scores/number of activities  Minimally Detectable Change: 3 points (for  single activity); 2 points (for average score)  [facility] (nd). The Patient Specific Functional Scale . Retrieved from [email or web address].[email or web address]   COGNITION: Overall cognitive status: Within functional limits for tasks assessed     SENSATION: Light touch: WFL  MUSCLE LENGTH: Not assessed on eval  POSTURE: rounded shoulders, forward head, and increased thoracic kyphosis  PALPATION: TTP 2/10 at Left upper/lateral glute  LUMBAR ROM:   AROM Eval 01/07/2024 02/10/2024  Flexion WFL   Extension 50%   Right lateral flexion 50-75%   Left lateral flexion 50-75%   Right rotation 50%   Left rotation 50%     (Blank rows = not tested)  LOWER EXTREMITY ROM:     ROM Right Eval 01/07/2024 Left Eval 01/07/2024 Left/Right 05/19/2024  Hip flexion [facility]  105/105  Hip extension     Hip abduction     Hip adduction     Hip internal rotation   12/17  Hip external rotation   12/30  Knee flexion     Knee extension     Ankle dorsiflexion     Ankle plantarflexion     Ankle inversion     Ankle eversion     Hamstrings   50/50   (Blank rows = not tested)  LOWER EXTREMITY MMT:    MMT Right Eval 01/07/2024 Left Eval 01/07/2024 03/10/2024 04/03/2024 05/19/2024  Hip flexion (seated) 5/5 4-/5   4+/4+  Hip extension (prone) 3-/5 3-/5     Hip abduction (sidelying) 3-/5 3-/5 Rt: 3+/5 Lt: 3+/5 Lt: 2+/5 3-/4+  Hip adduction       Hip internal rotation       Hip external rotation       Knee flexion (seated) 5/5 4-/5     Knee extension (seated) 5/5 4/5   4+/5  Ankle dorsiflexion       Ankle plantarflexion       Ankle inversion       Ankle eversion        (Blank rows = not tested)  LUMBAR SPECIAL TESTS:  Straight leg raise test: Negative, Slump test: Negative, and FABER test: Positive on L, but due to past hip fracture Clonus: negative   FUNCTIONAL TESTS:  5 times sit to stand: 10.27s  performed with SBA 4 stance balance:  normal and narrow 10s, unable to complete tandem, 4s Rt 5s Lt single leg with lateral LOB and appropriate stepping reactions ; performed with CGA   03/10/2024 5xSTS: 11.22s performed with SBA  4 stance balance: normal: 10s narrow: 10s tandem Rt: 2s tandem LT: 3s Rt LE: 2s Lt LE: 2s   GAIT: Distance walked: not formally assessed  Assistive device utilized: None Level of assistance: SBA Comments: increased IR throughout gait with Left LE, antalgic gait pattern, Left lateral lean with Left stance  TREATMENT DATE:  05/28/2024 TherEx:  UBE with LE only, no resistance, 8 minutes  Standing hip abduction with red TB around knees 2x10 each side with bilat UE support on // bars  Standing hip extension with red TB around knees 2x10 each side with bilat UE support on // bars  PT discussed final HEP with patient to include the red TB with above exercises and how to progress/regress balance activities   TherAct:  Standing hip hikes in // bars with bilat UE support 2x10 each side  Fwd step ups with 6 step 2x8 each side with intermittent UE support on // bars, though improved with decreased speed and concentration    05/19/2024 Side-lie clams with Green Thera-Band resistance 2 sets of 10 for 2 seconds and slow eccentrics  Functional Activities: Hip hike parallel to counter top 2 sets of 10 for 3 seconds, avoid lateral lean  Neuromuscular re-education: Tandem balance 10 x 20 seconds, adjust width to encourage hip strategy  [id number]: Reviewed objective findings and discussed focusing efforts on hip abductors strength as this is the most glaring objective impairment   05/12/2024 TherEx:  Nustep LE only level 3 for 8 minutes  Sidelying clamshells 2x12 with 2s holds each side with green TB  Hooklying bridge with yellow ball between knees 2x10 with 2s holds  Fwd step ups with 4 steps 2x10 each side with unilat UE support (mainly fingertips for stability) Lateral step ups with 4 step 1x10 each  side with unilat UE support (mainly fingertips for stability) Minimal increases in pain in Lt LE following this activity, improved with rest   Neuro Re-Ed:  Rocker board fwd/back with UE support 1x20 taps  Rocker board lateral with UE support 1x20 taps  Patient reliant on UE support to appropriately perform above rocker board activities  PT discussed semi-tandem/staggered stance at home and how to increase progression slowly instead of going from staggered straight to tandem    05/07/2024 TherEx:  Nustep LE only level 3 for 6 minutes  Decreased time on machine today secondary to increased soreness/pain Sidelying clamshells with red TB 2x15 each side   Neuro Re-Ed:  Discussed performing staggered stance instead of tandem stance in order to decrease large challenge and slowly decreasing distance between feet once able to reach 30s with no UE support  Semitandem/staggered stance with horizontal head movements 1x12 each foot back with increased sway, but no LOB or UE use   Semitandem/staggered stance with vertical head movements 1x16 each foot back with increased sway, but no LOB or UE use  Typical/semi narrow stance with horizontal head movements with increased sway, but no LOB or UE use  Typical/semi narrow stance with vertical head movementswith increased sway, but no LOB or UE use  Lateral walking on foam pad with intermittent UE use on // bars 1x4 down and back with high levels of fatigue ; 1x2 down and back   PATIENT EDUCATION:  Education details: HEP, POC  Person educated: Patient Education method: Explanation, Demonstration, Tactile cues, Verbal cues, and Handouts Education comprehension: verbalized understanding, returned demonstration, verbal cues required, and tactile cues required  HOME EXERCISE PROGRAM: Access Code: [email or web address] URL: [email or web address] Date: 05/19/2024 Prepared by: Lamar Ivory  Exercises - Standing Hip Abduction with Counter Support  - 1 x  daily - 7 x weekly - 2 sets - 10 reps - Standing Hip Extension with Counter Support  - 1 x daily - 7 x weekly - 2 sets - 10 reps - Seated Active Straight-Leg Raise  - 1 x daily - 7 x weekly - 2 sets - 10 reps - Heel Raises with Counter Support  - 1 x daily - 7 x weekly - 2 sets - 10 reps - Mini Squat with Counter Support  - 1 x daily - 7 x weekly - 2 sets - 10 reps - Standing Lumbar Extension at Wall - Forearms  - 5 x daily - 7 x weekly - 1 sets - 5 reps - 3 seconds hold - Standing Hip Hiking  - 5 x daily - 7 x weekly - 1-2 sets - 10 reps - 3 seconds hold - Prone Hip Extension  - 2 x daily - 7 x weekly - 1-2 sets - 10 reps - 3 seconds hold - Romberg Stance with Head Rotation  - 1 x daily - 7 x weekly - 2 sets - 10 reps - Romberg Stance with Eyes Closed  - 1 x daily - 7 x weekly - 2 sets - 10 reps - Step Up  - 1 x daily - 7 x weekly - 2 sets - 10 reps - Standing Forward Step Taps with Counter Support  - 1 x daily - 7 x weekly - 2 sets - 10 reps - Clamshell with Resistance  - 2 x daily - 7 x weekly - 2-3 sets - 10 reps - 2-3 seconds hold - Tandem Stance  - 3 x daily - 7 x weekly - 1 sets - 10 reps - 20 second hold  ASSESSMENT:  CLINICAL IMPRESSION: Patient arrived to session endorsing no new changes in symptoms and continues to have difficulties with ambulation. Patient tolerated all activities this date including adding  resistance to standing abduction and extension. Patient has decided to make today her last session before returning to the MD to discuss lack of progress with pain levels. Patient is a formal and appropriate candidate for discharge.    OBJECTIVE IMPAIRMENTS: Abnormal gait, decreased activity tolerance, decreased balance, decreased coordination, decreased endurance, decreased mobility, difficulty walking, decreased ROM, decreased strength, improper body mechanics, postural dysfunction, and pain.   ACTIVITY LIMITATIONS: carrying, lifting, and stairs  PARTICIPATION LIMITATIONS:  meal prep, cleaning, laundry, community activity, and caretaker duties  PERSONAL FACTORS: Time since onset of injury/illness/exacerbation and 3+ comorbidities: GERD, HTN, ulcerative colitis, Turner's syndrome, osteopenia, dyslipidemia are also affecting patient's functional outcome.   REHAB POTENTIAL: Good  CLINICAL DECISION MAKING: Evolving/moderate complexity  EVALUATION COMPLEXITY: Moderate   GOALS: Goals reviewed with patient? Yes  SHORT TERM GOALS: Target date: 01/28/2024  Patient will show compliance with initial HEP. Baseline: Goal status: Met 01/23/2024  2.  Patient will report pain levels no greater than 5/10 to show improved overall quality of life. Baseline:  Goal status: goal not met, 05/28/2024   LONG TERM GOALS: Target date: 06/11/2024  Patient will show independence with final HEP in order to maintain and progress upon functional gains made within PT. Baseline:  Goal status: goal met, 05/28/2024  2.  Patient will report pain levels no greater than 3/10 to show improved overall quality of life. Baseline:  Goal status: goal not met, 05/28/2024  3.  Patient will increase PSFS to at least 4 to show significant improvement in subjective disability rating. Baseline:  Goal status: GOAL MET, 03/10/2024  4.  Patient will increase hip abduction MMT to at least 4-/5 in order to show improved biomechanics with functional mobility and gait.  Baseline:  Goal status: goal not met, 05/28/2024  5.  Patient will improve 4 stance balance in order to show increase in steadiness on feet.  Baseline:  Goal status: goal not met, 05/28/2024  PLAN:  PT FREQUENCY: 1-2x/week  PT DURATION: 4 additional weeks (likely will need a re-cert and additional visits after 06/11/2024)  PLANNED INTERVENTIONS: 97164- PT Re-evaluation, 97750- Physical Performance Testing, 97110-Therapeutic exercises, 97530- Therapeutic activity, 97112- Neuromuscular re-education, 97535- Self Care, 02859- Manual therapy,  [phone]- Gait training, [phone]- Electrical stimulation (unattended), [phone]- Electrical stimulation (manual), [id number]- Vasopneumatic device, [id number]- Ultrasound, [id number]- Traction (mechanical), [id number]- Ionotophoresis 4mg /ml Dexamethasone , 79439 (1-2 muscles), 20561 (3+ muscles)- Dry Needling, Patient/Family education, Balance training, Stair training, Taping, Joint mobilization, Joint manipulation, Spinal manipulation, Spinal mobilization, Scar mobilization, Vestibular training, DME instructions, Cryotherapy, and Moist heat.  PLAN FOR NEXT SESSION:   PHYSICAL THERAPY DISCHARGE SUMMARY  Visits from Start of Care: 16  Current functional level related to goals / functional outcomes: See above    Remaining deficits: See above    Education / Equipment: Red TB and final HEP    Patient agrees to discharge. Patient goals were partially met. Patient is being discharged due to lack of progress.    Susannah Daring, PT, DPT 05/28/2024 10:28 AM      "

## 2024-06-01 ENCOUNTER — Other Ambulatory Visit: Payer: Self-pay | Admitting: *Deleted

## 2024-06-01 ENCOUNTER — Inpatient Hospital Stay: Attending: Nurse Practitioner

## 2024-06-01 VITALS — BP 135/64 | HR 96 | Temp 98.5°F | Resp 16

## 2024-06-01 DIAGNOSIS — D696 Thrombocytopenia, unspecified: Secondary | ICD-10-CM

## 2024-06-01 DIAGNOSIS — J90 Pleural effusion, not elsewhere classified: Secondary | ICD-10-CM

## 2024-06-01 DIAGNOSIS — D72821 Monocytosis (symptomatic): Secondary | ICD-10-CM

## 2024-06-01 DIAGNOSIS — R000 Tachycardia, unspecified: Secondary | ICD-10-CM

## 2024-06-01 LAB — CBC WITH DIFFERENTIAL (CANCER CENTER ONLY)
Abs Immature Granulocytes: 0.55 K/uL — ABNORMAL HIGH (ref 0.00–0.07)
Basophils Absolute: 0 K/uL (ref 0.0–0.1)
Basophils Relative: 0 %
Eosinophils Absolute: 0.1 K/uL (ref 0.0–0.5)
Eosinophils Relative: 0 %
HCT: 40 % (ref 36.0–46.0)
Hemoglobin: 12.6 g/dL (ref 12.0–15.0)
Immature Granulocytes: 4 %
Lymphocytes Relative: 19 %
Lymphs Abs: 2.6 K/uL (ref 0.7–4.0)
MCH: 27.6 pg (ref 26.0–34.0)
MCHC: 31.5 g/dL (ref 30.0–36.0)
MCV: 87.5 fL (ref 80.0–100.0)
Monocytes Absolute: 3.6 K/uL — ABNORMAL HIGH (ref 0.1–1.0)
Monocytes Relative: 25 %
Neutro Abs: 7.2 K/uL (ref 1.7–7.7)
Neutrophils Relative %: 52 %
Platelet Count: 101 K/uL — ABNORMAL LOW (ref 150–400)
RBC: 4.57 MIL/uL (ref 3.87–5.11)
RDW: 15.1 % (ref 11.5–15.5)
Smear Review: NORMAL
WBC Count: 14 K/uL — ABNORMAL HIGH (ref 4.0–10.5)
nRBC: 0 % (ref 0.0–0.2)

## 2024-06-01 MED ORDER — ROMIPLOSTIM 125 MCG ~~LOC~~ SOLR
1.0000 ug/kg | Freq: Once | SUBCUTANEOUS | Status: AC
  Administered 2024-06-01: 70 ug via SUBCUTANEOUS
  Filled 2024-06-01: qty 0.14

## 2024-06-01 NOTE — Progress Notes (Signed)
 101,000. Per Dr. Timmy, pt. to receive Nplate  today.

## 2024-06-01 NOTE — Patient Instructions (Signed)
 Romiplostim Injection What is this medication? ROMIPLOSTIM (roe mi PLOE stim) treats low levels of platelets in your body caused by immune thrombocytopenia (ITP). It is prescribed when other medications have not worked or cannot be tolerated. It may also be used to help people who have been exposed to high doses of radiation. It works by increasing the amount of platelets in your blood. This lowers the risk of bleeding. This medicine may be used for other purposes; ask your health care provider or pharmacist if you have questions. COMMON BRAND NAME(S): Nplate What should I tell my care team before I take this medication? They need to know if you have any of these conditions: Blood clots Myelodysplastic syndrome An unusual or allergic reaction to romiplostim, mannitol, other medications, foods, dyes, or preservatives Pregnant or trying to get pregnant Breast-feeding How should I use this medication? This medication is injected under the skin. It is given by a care team in a hospital or clinic setting. A special MedGuide will be given to you before each treatment. Be sure to read this information carefully each time. Talk to your care team about the use of this medication in children. While it may be prescribed for children as young as newborns for selected conditions, precautions do apply. Overdosage: If you think you have taken too much of this medicine contact a poison control center or emergency room at once. NOTE: This medicine is only for you. Do not share this medicine with others. What if I miss a dose? Keep appointments for follow-up doses. It is important not to miss your dose. Call your care team if you are unable to keep an appointment. What may interact with this medication? Interactions are not expected. This list may not describe all possible interactions. Give your health care provider a list of all the medicines, herbs, non-prescription drugs, or dietary supplements you use. Also  tell them if you smoke, drink alcohol, or use illegal drugs. Some items may interact with your medicine. What should I watch for while using this medication? Visit your care team for regular checks on your progress. You may need blood work done while you are taking this medication. Your condition will be monitored carefully while you are receiving this medication. It is important not to miss any appointments. What side effects may I notice from receiving this medication? Side effects that you should report to your care team as soon as possible: Allergic reactions--skin rash, itching, hives, swelling of the face, lips, tongue, or throat Blood clot--pain, swelling, or warmth in the leg, shortness of breath, chest pain Side effects that usually do not require medical attention (report to your care team if they continue or are bothersome): Dizziness Joint pain Muscle pain Pain in the hands or feet Stomach pain Trouble sleeping This list may not describe all possible side effects. Call your doctor for medical advice about side effects. You may report side effects to FDA at 1-800-FDA-1088. Where should I keep my medication? This medication is given in a hospital or clinic. It will not be stored at home. NOTE: This sheet is a summary. It may not cover all possible information. If you have questions about this medicine, talk to your doctor, pharmacist, or health care provider.  2024 Elsevier/Gold Standard (2021-09-11 00:00:00)

## 2024-06-09 NOTE — Progress Notes (Signed)
 [name]                                          MRN: [id number]   06/09/2024   The [facility] Quality Team Specialist reviewed this patient medical record for the purposes of chart review for care gap closure. The following were reviewed: abstraction for care gap closure-controlling blood pressure.    [facility] Quality Team

## 2024-06-11 ENCOUNTER — Encounter

## 2024-06-15 ENCOUNTER — Inpatient Hospital Stay

## 2024-06-15 ENCOUNTER — Inpatient Hospital Stay: Admitting: Hematology & Oncology

## 2024-06-18 ENCOUNTER — Inpatient Hospital Stay

## 2024-06-18 ENCOUNTER — Other Ambulatory Visit: Payer: Self-pay

## 2024-06-18 ENCOUNTER — Encounter: Payer: Self-pay | Admitting: Hematology & Oncology

## 2024-06-18 ENCOUNTER — Inpatient Hospital Stay: Admitting: Hematology & Oncology

## 2024-06-18 VITALS — BP 139/69 | HR 80 | Temp 97.6°F | Resp 18 | Ht <= 58 in | Wt 156.0 lb

## 2024-06-18 DIAGNOSIS — D696 Thrombocytopenia, unspecified: Secondary | ICD-10-CM

## 2024-06-18 LAB — CBC WITH DIFFERENTIAL (CANCER CENTER ONLY)
Abs Immature Granulocytes: 0.69 10*3/uL — ABNORMAL HIGH (ref 0.00–0.07)
Basophils Absolute: 0.1 10*3/uL (ref 0.0–0.1)
Basophils Relative: 0 %
Eosinophils Absolute: 0.1 10*3/uL (ref 0.0–0.5)
Eosinophils Relative: 0 %
HCT: 41.6 % (ref 36.0–46.0)
Hemoglobin: 13.1 g/dL (ref 12.0–15.0)
Immature Granulocytes: 5 %
Lymphocytes Relative: 22 %
Lymphs Abs: 3.2 10*3/uL (ref 0.7–4.0)
MCH: 27.5 pg (ref 26.0–34.0)
MCHC: 31.5 g/dL (ref 30.0–36.0)
MCV: 87.2 fL (ref 80.0–100.0)
Monocytes Absolute: 4 10*3/uL — ABNORMAL HIGH (ref 0.1–1.0)
Monocytes Relative: 27 %
Neutro Abs: 7 10*3/uL (ref 1.7–7.7)
Neutrophils Relative %: 46 %
Platelet Count: 103 10*3/uL — ABNORMAL LOW (ref 150–400)
RBC: 4.77 MIL/uL (ref 3.87–5.11)
RDW: 15.5 % (ref 11.5–15.5)
WBC Count: 15 10*3/uL — ABNORMAL HIGH (ref 4.0–10.5)
nRBC: 0 % (ref 0.0–0.2)

## 2024-06-18 LAB — CMP (CANCER CENTER ONLY)
ALT: 22 U/L (ref 0–44)
AST: 24 U/L (ref 15–41)
Albumin: 4.8 g/dL (ref 3.5–5.0)
Alkaline Phosphatase: 81 U/L (ref 38–126)
Anion gap: 10 (ref 5–15)
BUN: 17 mg/dL (ref 8–23)
CO2: 28 mmol/L (ref 22–32)
Calcium: 10.1 mg/dL (ref 8.9–10.3)
Chloride: 103 mmol/L (ref 98–111)
Creatinine: 0.81 mg/dL (ref 0.44–1.00)
GFR, Estimated: >60 mL/min
Glucose, Bld: 109 mg/dL — ABNORMAL HIGH (ref 70–99)
Potassium: 4.5 mmol/L (ref 3.5–5.1)
Sodium: 141 mmol/L (ref 135–145)
Total Bilirubin: 0.9 mg/dL (ref 0.0–1.2)
Total Protein: 7.1 g/dL (ref 6.5–8.1)

## 2024-06-18 LAB — SAVE SMEAR(SSMR), FOR PROVIDER SLIDE REVIEW

## 2024-06-18 MED ORDER — ROMIPLOSTIM 125 MCG ~~LOC~~ SOLR
1.0000 ug/kg | Freq: Once | SUBCUTANEOUS | Status: AC
  Administered 2024-06-18: 70 ug via SUBCUTANEOUS
  Filled 2024-06-18: qty 0.14

## 2024-06-18 NOTE — Patient Instructions (Signed)
 Romiplostim Injection What is this medication? ROMIPLOSTIM (roe mi PLOE stim) treats low levels of platelets in your body caused by immune thrombocytopenia (ITP). It is prescribed when other medications have not worked or cannot be tolerated. It may also be used to help people who have been exposed to high doses of radiation. It works by increasing the amount of platelets in your blood. This lowers the risk of bleeding. This medicine may be used for other purposes; ask your health care provider or pharmacist if you have questions. COMMON BRAND NAME(S): Nplate What should I tell my care team before I take this medication? They need to know if you have any of these conditions: Blood clots Myelodysplastic syndrome An unusual or allergic reaction to romiplostim, mannitol, other medications, foods, dyes, or preservatives Pregnant or trying to get pregnant Breast-feeding How should I use this medication? This medication is injected under the skin. It is given by a care team in a hospital or clinic setting. A special MedGuide will be given to you before each treatment. Be sure to read this information carefully each time. Talk to your care team about the use of this medication in children. While it may be prescribed for children as young as newborns for selected conditions, precautions do apply. Overdosage: If you think you have taken too much of this medicine contact a poison control center or emergency room at once. NOTE: This medicine is only for you. Do not share this medicine with others. What if I miss a dose? Keep appointments for follow-up doses. It is important not to miss your dose. Call your care team if you are unable to keep an appointment. What may interact with this medication? Interactions are not expected. This list may not describe all possible interactions. Give your health care provider a list of all the medicines, herbs, non-prescription drugs, or dietary supplements you use. Also  tell them if you smoke, drink alcohol, or use illegal drugs. Some items may interact with your medicine. What should I watch for while using this medication? Visit your care team for regular checks on your progress. You may need blood work done while you are taking this medication. Your condition will be monitored carefully while you are receiving this medication. It is important not to miss any appointments. What side effects may I notice from receiving this medication? Side effects that you should report to your care team as soon as possible: Allergic reactions--skin rash, itching, hives, swelling of the face, lips, tongue, or throat Blood clot--pain, swelling, or warmth in the leg, shortness of breath, chest pain Side effects that usually do not require medical attention (report to your care team if they continue or are bothersome): Dizziness Joint pain Muscle pain Pain in the hands or feet Stomach pain Trouble sleeping This list may not describe all possible side effects. Call your doctor for medical advice about side effects. You may report side effects to FDA at 1-800-FDA-1088. Where should I keep my medication? This medication is given in a hospital or clinic. It will not be stored at home. NOTE: This sheet is a summary. It may not cover all possible information. If you have questions about this medicine, talk to your doctor, pharmacist, or health care provider.  2024 Elsevier/Gold Standard (2021-09-11 00:00:00)

## 2024-06-18 NOTE — Progress Notes (Signed)
 " Hematology and Oncology Follow Up Visit  [name] [id number] 01-17-1956 69 y.o. 06/18/2024   Principle Diagnosis:  Thrombocytopenia-possible ITP versus MDS -- NGS shows BCOR, TET2 & ZRSR  Current Therapy:   Nplate -q. weekly to keep platelet count above 100,000     Interim History:  [name] is back for her follow-up.  She had a very wonderful time up in [place]  for her mom's 90th birthday.  Also happy that she was able to make it up there for the birthday party.  She is doing okay.  She has to be very careful being outside.  Her neighborhood still has a lot of ice and is hard to get around.  She has had no problems with bleeding or bruising.  She has had no issues with nausea or vomiting.  There has been no cough or shortness of breath.  She has had no leg swelling..  She has complained some itchiness in the lower back.  I took a look.  I do not see any rashes.  There are no vesicles.  I told her to try some hydrocortisone cream to see if that may help.  Overall, I would say that her performance status is probably ECOG 1.    Medications:  Current Outpatient Medications:    tolterodine  (DETROL  LA) 4 MG 24 hr capsule, Take 4 mg by mouth daily., Disp: , Rfl:    alendronate  (FOSAMAX ) 70 MG tablet, Take 70 mg by mouth once a week., Disp: , Rfl:    Calcium  Polycarbophil (FIBER-CAPS PO), Take 3 tablets by mouth daily at 6 (six) AM., Disp: , Rfl:    celecoxib  (CELEBREX ) 100 MG capsule, Take 100 mg by mouth as needed for mild pain (pain score 1-3) or moderate pain (pain score 4-6)., Disp: , Rfl:    Docusate Sodium  (COLACE PO), Take 3 capsules by mouth daily at 6 (six) AM., Disp: , Rfl:    lisinopril  (ZESTRIL ) 40 MG tablet, TAKE 1 TABLET BY MOUTH EVERY DAY, Disp: 90 tablet, Rfl: 1   metoprolol  succinate (TOPROL -XL) 25 MG 24 hr tablet, TAKE 1 TABLET (25 MG TOTAL) BY MOUTH DAILY., Disp: 90 tablet, Rfl: 1   rosuvastatin  (CRESTOR ) 20 MG tablet, TAKE 1 TABLET BY MOUTH DAILY. GENERIC  EQUIVALENT FOR CRESTOR ., Disp: 90 tablet, Rfl: 1   traZODone  (DESYREL ) 50 MG tablet, TAKE ONE-HALF TO ONE TABLET BY MOUTH AT BEDTIME AS NEED FOR SLEEP, Disp: 90 tablet, Rfl: 1   VITAMIN D  PO, Take 2,000 Units by mouth daily., Disp: , Rfl:   Allergies:  Allergies  Allergen Reactions   Prednisone  Nausea And Vomiting and Other (See Comments)     headache, dropping platelets    Past Medical History, Surgical history, Social history, and Family History were reviewed and updated.  Review of Systems: Review of Systems  Constitutional:  Positive for fatigue.  HENT:  Negative.    Eyes: Negative.   Respiratory: Negative.    Cardiovascular: Negative.   Gastrointestinal: Negative.   Endocrine: Negative.   Genitourinary: Negative.    Musculoskeletal: Negative.   Skin: Negative.   Neurological: Negative.   Hematological: Negative.   Psychiatric/Behavioral: Negative.      Physical Exam:  Vital signs show temperature 97.6.  Pulse 80.  Blood pressure 139/69.  Weight is 156 pounds.    Wt Readings from Last 3 Encounters:  06/18/24 156 lb (70.8 kg)  05/25/24 155 lb (70.3 kg)  05/18/24 158 lb (71.7 kg)    Physical Exam Vitals reviewed.  HENT:     Head: Normocephalic and atraumatic.  Eyes:     Pupils: Pupils are equal, round, and reactive to light.  Cardiovascular:     Rate and Rhythm: Normal rate and regular rhythm.     Heart sounds: Normal heart sounds.  Pulmonary:     Effort: Pulmonary effort is normal.     Breath sounds: Normal breath sounds.  Abdominal:     General: Bowel sounds are normal.     Palpations: Abdomen is soft.  Musculoskeletal:        General: No tenderness or deformity. Normal range of motion.     Cervical back: Normal range of motion.  Lymphadenopathy:     Cervical: No cervical adenopathy.  Skin:    General: Skin is warm and dry.     Findings: No erythema or rash.  Neurological:     Mental Status: She is alert and oriented to person, place, and time.   Psychiatric:        Behavior: Behavior normal.        Thought Content: Thought content normal.        Judgment: Judgment normal.      Lab Results  Component Value Date   WBC 15.0 (H) 06/18/2024   HGB 13.1 06/18/2024   HCT 41.6 06/18/2024   MCV 87.2 06/18/2024   PLT 103 (L) 06/18/2024     Chemistry      Component Value Date/Time   NA 141 06/18/2024 0850   NA 137 11/29/2023 1545   K 4.5 06/18/2024 0850   CL 103 06/18/2024 0850   CO2 28 06/18/2024 0850   BUN 17 06/18/2024 0850   BUN 13 11/29/2023 1545   CREATININE 0.81 06/18/2024 0850      Component Value Date/Time   CALCIUM  10.1 06/18/2024 0850   ALKPHOS 81 06/18/2024 0850   AST 24 06/18/2024 0850   ALT 22 06/18/2024 0850   BILITOT 0.9 06/18/2024 0850     Normochromic normocytic population of red blood cells.  She has no nucleated red blood cells.  I see no teardrop cells.  She has no rouleaux formation.  There is no schistocytes or spherocytes.  White blood cells appear normal in morphology and maturation.  She may have a slight increase in neutrophils.  I do not see any immature myeloid or lymphoid cells.  Platelets are adequate in number.  She may have a rare large platelet.  Impression and Plan:  [name] is a very nice 69 year old white female.  She has a history of Turner's syndrome.  She has thrombocytopenia.  Again, it is not clear as to the exact etiology.  I will go ahead and give her a dose of Nplate  today.  Even though her platelet count is above 100,000, I think that it would be worthwhile to give her a dose just to make sure that we do not see the platelet count go down.  With the Winter weather, I do not want to see anything happen to her and that she may fall and potentially have a bleeding issue.  I think if we got her platelet count up, this will be protective for for her.  I think we can go every 3 weeks now.  I will plan to see her back myself in 6 weeks.   [name], MD 1/29/20269:58 AM "

## 2024-06-22 ENCOUNTER — Ambulatory Visit: Admitting: Family Medicine

## 2024-06-25 ENCOUNTER — Encounter: Payer: Self-pay | Admitting: Family Medicine

## 2024-06-25 ENCOUNTER — Ambulatory Visit: Admitting: Family Medicine

## 2024-06-25 VITALS — BP 132/62 | HR 100 | Temp 97.9°F | Ht <= 58 in | Wt 159.4 lb

## 2024-06-25 DIAGNOSIS — M8580 Other specified disorders of bone density and structure, unspecified site: Secondary | ICD-10-CM

## 2024-06-25 DIAGNOSIS — Z780 Asymptomatic menopausal state: Secondary | ICD-10-CM

## 2024-06-25 DIAGNOSIS — I10 Essential (primary) hypertension: Secondary | ICD-10-CM

## 2024-06-25 NOTE — Assessment & Plan Note (Signed)
 Pt is overdue for her follow up DEXA scan, she is currently on weekly fosamax  but admits it is difficult to remember to take it.

## 2024-06-25 NOTE — Progress Notes (Signed)
 "  Established Patient Office Visit  Subjective   Patient ID: [name], female    DOB: 1956-05-18  Age: 69 y.o. MRN: [id number]  Chief Complaint  Patient presents with   Medical Management of Chronic Issues    HPI Discussed the use of AI scribe software for clinical note transcription with the patient, who gave verbal consent to proceed.  History of Present Illness   [name] is a 69 year old female with thrombocytopenia who presents for a regular follow-up visit.  She has thrombocytopenia with a current platelet count of 103,000, managed with injections every three weeks per hematology.  She has had episodic tachycardia with prior heart rates up to 150 bpm and was asymptomatic with a reassuring EKG at that time. Her heart rate has recently been closer to 80 bpm.  She has chronic leg problems. PT was not helpful. Hip MRI showed degenerative changes. She has not had any further injections in her hip.  She takes metoprolol , a cholesterol medication, and trazodone  for sleep, which is effective. She takes Fosamax  for low bone density but is not reliably taking the weekly dose. She was on Prolia for about six years and had three fractures in one year, including a hip fracture.  A recent blood sugar was 109 mg/dL. Her A1c has not been elevated previously, so it was not rechecked today.       Current Outpatient Medications  Medication Instructions   alendronate  (FOSAMAX ) 70 mg, Weekly   celecoxib  (CELEBREX ) 100 mg, As needed   Docusate Sodium  (COLACE PO) 3 capsules, Daily   lisinopril  (ZESTRIL ) 40 mg, Oral, Daily   metoprolol  succinate (TOPROL -XL) 25 mg, Oral, Daily   rosuvastatin  (CRESTOR ) 20 MG tablet TAKE 1 TABLET BY MOUTH DAILY. GENERIC EQUIVALENT FOR CRESTOR .   tolterodine  (DETROL  LA) 4 mg, Daily   traZODone  (DESYREL ) 50 MG tablet TAKE ONE-HALF TO ONE TABLET BY MOUTH AT BEDTIME AS NEED FOR SLEEP   VITAMIN D  PO 2,000 Units, Daily    Patient Active Problem List    Diagnosis Date Noted   Essential hypertension 02/03/2009    Priority: 1.   Dyslipidemia 06/20/2012    Priority: 2.   Osteopenia/?Osteoporosis - followed by her gynecologist, Dr. Kandyce 06/20/2012    Priority: 4.   Sensorineural hearing loss, bilateral 05/25/2024   Hyponatremia 10/19/2023   Thrombocytopenia 10/19/2023   Acute leg pain, left 07/19/2022   H/O Turner syndrome 05/02/2012   INFLAMMATORY BOWEL DISEASE - Followed by GI 02/03/2009   History of colonic polyps 02/03/2009     Review of Systems  All other systems reviewed and are negative.     Objective:     BP 132/62   Pulse 100   Temp 97.9 F (36.6 C) (Oral)   Ht 4' 9 (1.448 m)   Wt 159 lb 6.4 oz (72.3 kg)   SpO2 98%   BMI 34.49 kg/m    Physical Exam Vitals reviewed.  Constitutional:      Appearance: She is well-developed. She is obese.  Cardiovascular:     Rate and Rhythm: Normal rate and regular rhythm.     Heart sounds: No murmur heard. Pulmonary:     Effort: Pulmonary effort is normal.     Breath sounds: Normal breath sounds. No wheezing.  Musculoskeletal:     Right lower leg: No edema.     Left lower leg: No edema.  Neurological:     Mental Status: She is alert and oriented to person, place, and time.  No results found for any visits on 06/25/24.    The 10-year ASCVD risk score (Arnett DK, et al., 2019) is: 9.8%    Assessment & Plan:  Essential hypertension Assessment & Plan: Current hypertension medications:       Sig   lisinopril  (ZESTRIL ) 40 MG tablet (Taking) TAKE 1 TABLET BY MOUTH EVERY DAY   metoprolol  succinate (TOPROL -XL) 25 MG 24 hr tablet (Taking) TAKE 1 TABLET (25 MG TOTAL) BY MOUTH DAILY.         Postmenopausal state -     DG Bone Density; Future  Osteopenia, unspecified location Assessment & Plan: Pt is overdue for her follow up DEXA scan, she is currently on weekly fosamax  but admits it is difficult to remember to take it.   Orders: -     DG Bone  Density; Future   Assessment and Plan    Osteopenia Managed with Fosamax . Previous Prolia use for six years. Bone density test last performed in 2023, due for repeat this year. Non-compliance with weekly Fosamax  noted. Previous hip fracture and multiple bone breaks in one year. - Ordered DEXA scan at [facility] on [facility] - Continue Fosamax  once weekly.  Essential hypertension - Continue current antihypertensive medication regimen.        Return in about 6 months (around 12/23/2024) for annual physical exam.    Heron CHRISTELLA Sharper, MD "

## 2024-06-25 NOTE — Assessment & Plan Note (Signed)
 Current hypertension medications:       Sig   lisinopril  (ZESTRIL ) 40 MG tablet (Taking) TAKE 1 TABLET BY MOUTH EVERY DAY   metoprolol  succinate (TOPROL -XL) 25 MG 24 hr tablet (Taking) TAKE 1 TABLET (25 MG TOTAL) BY MOUTH DAILY.

## 2024-06-29 ENCOUNTER — Ambulatory Visit: Admitting: Orthopedic Surgery

## 2024-07-09 ENCOUNTER — Inpatient Hospital Stay

## 2024-07-10 ENCOUNTER — Inpatient Hospital Stay

## 2024-07-30 ENCOUNTER — Inpatient Hospital Stay: Admitting: Hematology & Oncology

## 2024-07-30 ENCOUNTER — Inpatient Hospital Stay

## 2024-12-21 ENCOUNTER — Encounter: Admitting: Family Medicine
